# Patient Record
Sex: Male | Born: 1960 | Race: White | Hispanic: No | Marital: Married | Smoking: Former smoker
Health system: Southern US, Community
[De-identification: ages and names within clinical notes are randomized; demographics above are authoritative.]

## PROBLEM LIST (undated history)

## (undated) DIAGNOSIS — M51369 Other intervertebral disc degeneration, lumbar region without mention of lumbar back pain or lower extremity pain: Secondary | ICD-10-CM

## (undated) DIAGNOSIS — I719 Aortic aneurysm of unspecified site, without rupture: Secondary | ICD-10-CM

## (undated) DIAGNOSIS — I10 Essential (primary) hypertension: Secondary | ICD-10-CM

## (undated) DIAGNOSIS — M549 Dorsalgia, unspecified: Secondary | ICD-10-CM

## (undated) DIAGNOSIS — IMO0001 Reserved for inherently not codable concepts without codable children: Secondary | ICD-10-CM

## (undated) DIAGNOSIS — I2510 Atherosclerotic heart disease of native coronary artery without angina pectoris: Secondary | ICD-10-CM

## (undated) DIAGNOSIS — Z87442 Personal history of urinary calculi: Secondary | ICD-10-CM

## (undated) DIAGNOSIS — I480 Paroxysmal atrial fibrillation: Secondary | ICD-10-CM

## (undated) DIAGNOSIS — J189 Pneumonia, unspecified organism: Secondary | ICD-10-CM

## (undated) DIAGNOSIS — M5136 Other intervertebral disc degeneration, lumbar region: Secondary | ICD-10-CM

## (undated) DIAGNOSIS — K219 Gastro-esophageal reflux disease without esophagitis: Secondary | ICD-10-CM

## (undated) DIAGNOSIS — G8929 Other chronic pain: Secondary | ICD-10-CM

## (undated) HISTORY — PX: BACK SURGERY: SHX140

## (undated) HISTORY — PX: TONSILLECTOMY: SUR1361

---

## 2001-07-31 ENCOUNTER — Encounter: Payer: Self-pay | Admitting: Emergency Medicine

## 2001-07-31 ENCOUNTER — Emergency Department (HOSPITAL_COMMUNITY): Admission: EM | Admit: 2001-07-31 | Discharge: 2001-07-31 | Payer: Self-pay | Admitting: Emergency Medicine

## 2002-06-14 ENCOUNTER — Emergency Department (HOSPITAL_COMMUNITY): Admission: EM | Admit: 2002-06-14 | Discharge: 2002-06-14 | Payer: Self-pay | Admitting: Emergency Medicine

## 2002-09-22 ENCOUNTER — Emergency Department (HOSPITAL_COMMUNITY): Admission: EM | Admit: 2002-09-22 | Discharge: 2002-09-22 | Payer: Self-pay | Admitting: Emergency Medicine

## 2003-06-16 ENCOUNTER — Emergency Department (HOSPITAL_COMMUNITY): Admission: EM | Admit: 2003-06-16 | Discharge: 2003-06-16 | Payer: Self-pay | Admitting: Emergency Medicine

## 2004-02-05 ENCOUNTER — Emergency Department (HOSPITAL_COMMUNITY): Admission: EM | Admit: 2004-02-05 | Discharge: 2004-02-05 | Payer: Self-pay | Admitting: Emergency Medicine

## 2005-02-11 ENCOUNTER — Emergency Department (HOSPITAL_COMMUNITY): Admission: EM | Admit: 2005-02-11 | Discharge: 2005-02-11 | Payer: Self-pay | Admitting: Emergency Medicine

## 2005-03-28 ENCOUNTER — Emergency Department (HOSPITAL_COMMUNITY): Admission: EM | Admit: 2005-03-28 | Discharge: 2005-03-28 | Payer: Self-pay | Admitting: Emergency Medicine

## 2006-05-09 ENCOUNTER — Emergency Department (HOSPITAL_COMMUNITY): Admission: EM | Admit: 2006-05-09 | Discharge: 2006-05-09 | Payer: Self-pay | Admitting: Emergency Medicine

## 2007-06-12 ENCOUNTER — Emergency Department (HOSPITAL_COMMUNITY): Admission: EM | Admit: 2007-06-12 | Discharge: 2007-06-12 | Payer: Self-pay | Admitting: *Deleted

## 2007-12-27 ENCOUNTER — Ambulatory Visit: Payer: Self-pay | Admitting: Cardiology

## 2008-06-15 ENCOUNTER — Emergency Department (HOSPITAL_COMMUNITY): Admission: EM | Admit: 2008-06-15 | Discharge: 2008-06-15 | Payer: Self-pay | Admitting: Emergency Medicine

## 2008-06-29 ENCOUNTER — Emergency Department (HOSPITAL_COMMUNITY): Admission: EM | Admit: 2008-06-29 | Discharge: 2008-06-29 | Payer: Self-pay | Admitting: Emergency Medicine

## 2008-07-08 ENCOUNTER — Emergency Department (HOSPITAL_COMMUNITY): Admission: EM | Admit: 2008-07-08 | Discharge: 2008-07-08 | Payer: Self-pay | Admitting: Emergency Medicine

## 2008-07-11 ENCOUNTER — Emergency Department (HOSPITAL_COMMUNITY): Admission: EM | Admit: 2008-07-11 | Discharge: 2008-07-11 | Payer: Self-pay | Admitting: Emergency Medicine

## 2008-08-15 ENCOUNTER — Emergency Department (HOSPITAL_COMMUNITY): Admission: EM | Admit: 2008-08-15 | Discharge: 2008-08-15 | Payer: Self-pay | Admitting: Emergency Medicine

## 2009-06-21 ENCOUNTER — Emergency Department (HOSPITAL_COMMUNITY): Admission: EM | Admit: 2009-06-21 | Discharge: 2009-06-21 | Payer: Self-pay | Admitting: Emergency Medicine

## 2009-07-05 ENCOUNTER — Emergency Department (HOSPITAL_COMMUNITY): Admission: EM | Admit: 2009-07-05 | Discharge: 2009-07-05 | Payer: Self-pay | Admitting: Emergency Medicine

## 2009-08-05 ENCOUNTER — Emergency Department (HOSPITAL_COMMUNITY): Admission: EM | Admit: 2009-08-05 | Discharge: 2009-08-05 | Payer: Self-pay | Admitting: Emergency Medicine

## 2009-11-13 ENCOUNTER — Emergency Department (HOSPITAL_COMMUNITY): Admission: EM | Admit: 2009-11-13 | Discharge: 2009-11-13 | Payer: Self-pay | Admitting: Emergency Medicine

## 2010-05-18 ENCOUNTER — Emergency Department (HOSPITAL_COMMUNITY): Admission: EM | Admit: 2010-05-18 | Discharge: 2010-05-18 | Payer: Self-pay | Admitting: Emergency Medicine

## 2010-07-10 ENCOUNTER — Emergency Department (HOSPITAL_COMMUNITY): Admission: EM | Admit: 2010-07-10 | Discharge: 2010-07-10 | Payer: Self-pay | Admitting: Emergency Medicine

## 2010-07-15 ENCOUNTER — Emergency Department (HOSPITAL_COMMUNITY): Admission: EM | Admit: 2010-07-15 | Discharge: 2010-07-15 | Payer: Self-pay | Admitting: Emergency Medicine

## 2010-08-01 ENCOUNTER — Emergency Department (HOSPITAL_COMMUNITY): Admission: EM | Admit: 2010-08-01 | Discharge: 2010-08-01 | Payer: Self-pay | Admitting: Emergency Medicine

## 2010-08-08 ENCOUNTER — Emergency Department (HOSPITAL_COMMUNITY): Admission: EM | Admit: 2010-08-08 | Discharge: 2010-08-08 | Payer: Self-pay | Admitting: Emergency Medicine

## 2010-09-01 ENCOUNTER — Emergency Department (HOSPITAL_COMMUNITY): Admission: EM | Admit: 2010-09-01 | Discharge: 2010-09-01 | Payer: Self-pay | Admitting: Emergency Medicine

## 2010-09-02 ENCOUNTER — Ambulatory Visit (HOSPITAL_COMMUNITY): Admission: RE | Admit: 2010-09-02 | Discharge: 2010-09-02 | Payer: Self-pay | Admitting: Emergency Medicine

## 2010-09-06 ENCOUNTER — Emergency Department (HOSPITAL_COMMUNITY): Admission: EM | Admit: 2010-09-06 | Discharge: 2010-09-06 | Payer: Self-pay | Admitting: Emergency Medicine

## 2010-09-11 ENCOUNTER — Emergency Department (HOSPITAL_COMMUNITY): Admission: EM | Admit: 2010-09-11 | Discharge: 2010-09-11 | Payer: Self-pay | Admitting: Emergency Medicine

## 2010-09-20 ENCOUNTER — Encounter: Admission: RE | Admit: 2010-09-20 | Discharge: 2010-09-20 | Payer: Self-pay | Admitting: Neurological Surgery

## 2010-10-07 ENCOUNTER — Emergency Department (HOSPITAL_COMMUNITY)
Admission: EM | Admit: 2010-10-07 | Discharge: 2010-10-07 | Payer: Self-pay | Source: Home / Self Care | Admitting: Emergency Medicine

## 2011-01-15 ENCOUNTER — Encounter: Payer: Self-pay | Admitting: Orthopaedic Surgery

## 2011-01-15 ENCOUNTER — Encounter: Payer: Self-pay | Admitting: Neurological Surgery

## 2011-01-16 ENCOUNTER — Encounter: Payer: Self-pay | Admitting: Orthopaedic Surgery

## 2011-03-10 LAB — GLUCOSE, CAPILLARY: Glucose-Capillary: 246 mg/dL — ABNORMAL HIGH (ref 70–99)

## 2011-03-11 LAB — BASIC METABOLIC PANEL
CO2: 29 mEq/L (ref 19–32)
Calcium: 8.9 mg/dL (ref 8.4–10.5)
Creatinine, Ser: 0.75 mg/dL (ref 0.4–1.5)
GFR calc Af Amer: >60 mL/min (ref >60)
Glucose, Bld: 191 mg/dL — ABNORMAL HIGH (ref 70–99)
Sodium: 134 mEq/L — ABNORMAL LOW (ref 135–145)

## 2011-03-11 LAB — DIFFERENTIAL
Basophils Relative: 0 % (ref 0–1)
Eosinophils Relative: 4 % (ref 0–5)
Lymphocytes Relative: 33 % (ref 12–46)
Monocytes Absolute: 0.6 10*3/uL (ref 0.1–1.0)
Monocytes Relative: 9 % (ref 3–12)

## 2011-03-11 LAB — CBC
MCHC: 35.4 g/dL (ref 30.0–36.0)
Platelets: 278 10*3/uL (ref 150–400)
RBC: 4.05 MIL/uL — ABNORMAL LOW (ref 4.22–5.81)

## 2011-03-11 LAB — URINE CULTURE: Culture  Setup Time: 201108071945

## 2011-03-11 LAB — URINALYSIS, ROUTINE W REFLEX MICROSCOPIC: Urobilinogen, UA: 0.2 mg/dL (ref 0.0–1.0)

## 2011-03-11 LAB — BRAIN NATRIURETIC PEPTIDE: Pro B Natriuretic peptide (BNP): <30 pg/mL (ref 0.0–100.0)

## 2011-04-03 LAB — POCT I-STAT, CHEM 8
Calcium, Ion: 1.19 mmol/L (ref 1.12–1.32)
Chloride: 106 mEq/L (ref 96–112)
Creatinine, Ser: 0.9 mg/dL (ref 0.4–1.5)
Glucose, Bld: 138 mg/dL — ABNORMAL HIGH (ref 70–99)
Hemoglobin: 15 g/dL (ref 13.0–17.0)
Potassium: 3.8 mEq/L (ref 3.5–5.1)
Sodium: 141 mEq/L (ref 135–145)
TCO2: 26 mmol/L (ref 0–100)

## 2011-05-25 ENCOUNTER — Emergency Department (HOSPITAL_COMMUNITY)
Admission: EM | Admit: 2011-05-25 | Discharge: 2011-05-25 | Disposition: A | Discharge status: Home / Self Care | Payer: Medicare Other | Attending: Emergency Medicine | Admitting: Emergency Medicine

## 2011-05-25 DIAGNOSIS — F17200 Nicotine dependence, unspecified, uncomplicated: Secondary | ICD-10-CM | POA: Insufficient documentation

## 2011-05-25 DIAGNOSIS — R109 Unspecified abdominal pain: Secondary | ICD-10-CM | POA: Insufficient documentation

## 2011-05-25 DIAGNOSIS — R319 Hematuria, unspecified: Secondary | ICD-10-CM | POA: Insufficient documentation

## 2011-05-25 DIAGNOSIS — E119 Type 2 diabetes mellitus without complications: Secondary | ICD-10-CM | POA: Insufficient documentation

## 2011-05-25 LAB — BASIC METABOLIC PANEL
BUN: 13 mg/dL (ref 6–23)
CO2: 30 mEq/L (ref 19–32)
Calcium: 10.1 mg/dL (ref 8.4–10.5)
Chloride: 98 mEq/L (ref 96–112)
GFR calc Af Amer: >60 mL/min (ref >60)
GFR calc non Af Amer: >60 mL/min (ref >60)
Potassium: 3.2 mEq/L — ABNORMAL LOW (ref 3.5–5.1)
Sodium: 137 mEq/L (ref 135–145)

## 2011-05-25 LAB — URINALYSIS, ROUTINE W REFLEX MICROSCOPIC
Ketones, ur: NEGATIVE mg/dL
Leukocytes, UA: NEGATIVE
Specific Gravity, Urine: 1.02 (ref 1.005–1.030)

## 2011-05-25 LAB — URINE MICROSCOPIC-ADD ON

## 2011-07-13 ENCOUNTER — Other Ambulatory Visit: Payer: Self-pay | Admitting: Medical

## 2011-07-13 NOTE — Telephone Encounter (Signed)
Is this ok?

## 2011-10-29 ENCOUNTER — Other Ambulatory Visit: Payer: Self-pay | Admitting: Medical

## 2011-10-31 NOTE — Telephone Encounter (Signed)
Is this okay to refill? 

## 2011-11-01 NOTE — Telephone Encounter (Signed)
pls pull chart 

## 2011-12-24 ENCOUNTER — Encounter: Payer: Self-pay | Admitting: *Deleted

## 2011-12-24 ENCOUNTER — Emergency Department (HOSPITAL_COMMUNITY)
Admission: EM | Admit: 2011-12-24 | Discharge: 2011-12-24 | Discharge status: Against Medical Advice | Payer: Medicare Other | Attending: Emergency Medicine | Admitting: Emergency Medicine

## 2011-12-24 DIAGNOSIS — M549 Dorsalgia, unspecified: Secondary | ICD-10-CM | POA: Insufficient documentation

## 2011-12-24 HISTORY — DX: Other intervertebral disc degeneration, lumbar region without mention of lumbar back pain or lower extremity pain: M51.369

## 2011-12-24 HISTORY — DX: Other intervertebral disc degeneration, lumbar region: M51.36

## 2011-12-24 NOTE — ED Notes (Signed)
Pt has history of back disc degeneration, now haas increased back pain and leg pain with numbness in toes

## 2012-01-05 ENCOUNTER — Emergency Department (HOSPITAL_COMMUNITY)
Admission: EM | Admit: 2012-01-05 | Discharge: 2012-01-05 | Disposition: A | Discharge status: Home / Self Care | Payer: Medicare Other | Attending: Emergency Medicine | Admitting: Emergency Medicine

## 2012-01-05 ENCOUNTER — Encounter (HOSPITAL_COMMUNITY): Payer: Self-pay | Admitting: Emergency Medicine

## 2012-01-05 DIAGNOSIS — M5137 Other intervertebral disc degeneration, lumbosacral region: Secondary | ICD-10-CM | POA: Insufficient documentation

## 2012-01-05 DIAGNOSIS — I10 Essential (primary) hypertension: Secondary | ICD-10-CM | POA: Insufficient documentation

## 2012-01-05 DIAGNOSIS — Z794 Long term (current) use of insulin: Secondary | ICD-10-CM | POA: Insufficient documentation

## 2012-01-05 DIAGNOSIS — M51379 Other intervertebral disc degeneration, lumbosacral region without mention of lumbar back pain or lower extremity pain: Secondary | ICD-10-CM | POA: Insufficient documentation

## 2012-01-05 DIAGNOSIS — M5431 Sciatica, right side: Secondary | ICD-10-CM

## 2012-01-05 DIAGNOSIS — M5430 Sciatica, unspecified side: Secondary | ICD-10-CM | POA: Insufficient documentation

## 2012-01-05 DIAGNOSIS — F17200 Nicotine dependence, unspecified, uncomplicated: Secondary | ICD-10-CM | POA: Insufficient documentation

## 2012-01-05 DIAGNOSIS — M109 Gout, unspecified: Secondary | ICD-10-CM | POA: Insufficient documentation

## 2012-01-05 DIAGNOSIS — E119 Type 2 diabetes mellitus without complications: Secondary | ICD-10-CM | POA: Insufficient documentation

## 2012-01-05 HISTORY — DX: Essential (primary) hypertension: I10

## 2012-01-05 MED ORDER — OXYCODONE-ACETAMINOPHEN 5-325 MG PO TABS: 1.0000 | ORAL_TABLET | Freq: Four times a day (QID) | ORAL | Status: AC | PRN

## 2012-01-05 MED ORDER — DIAZEPAM 5 MG PO TABS
10.0000 mg | ORAL_TABLET | Freq: Once | ORAL | Status: AC
  Administered 2012-01-05: 10 mg via ORAL
  Filled 2012-01-05: qty 2

## 2012-01-05 MED ORDER — IBUPROFEN 600 MG PO TABS: 600.0000 mg | ORAL_TABLET | Freq: Four times a day (QID) | ORAL | Status: AC | PRN

## 2012-01-05 MED ORDER — HYDROMORPHONE HCL PF 2 MG/ML IJ SOLN
2.0000 mg | Freq: Once | INTRAMUSCULAR | Status: AC
  Administered 2012-01-05: 2 mg via INTRAMUSCULAR
  Filled 2012-01-05: qty 1

## 2012-01-05 MED ORDER — CYCLOBENZAPRINE HCL 10 MG PO TABS: 10.0000 mg | ORAL_TABLET | Freq: Two times a day (BID) | ORAL | Status: AC | PRN

## 2012-01-05 MED ORDER — KETOROLAC TROMETHAMINE 60 MG/2ML IM SOLN
60.0000 mg | Freq: Once | INTRAMUSCULAR | Status: AC
  Administered 2012-01-05: 60 mg via INTRAMUSCULAR
  Filled 2012-01-05: qty 2

## 2012-01-05 NOTE — ED Notes (Signed)
Pt c/o pain to right hip and radiating down right leg. Pt states he can not get in with neuro/spine specialist 01/13/12.

## 2012-01-05 NOTE — ED Provider Notes (Signed)
History     CSN: [id number]  Arrival date & time 01/05/12  1458   First MD Initiated Contact with Patient 01/05/12 1608      Chief Complaint  Patient presents with  . Hip Pain    (Consider location/radiation/quality/duration/timing/severity/associated sxs/prior treatment) HPI Patient presents with complaint of low back pain radiating into his right hip and down posterior right leg. He states he has had similar pain intermittently over the past year and a half but has been worse over the past 2 weeks. There is no new injury or trauma. He denies fevers or chills. He has no weakness in his legs and no difficulty urinating and no loss of continence of bowel or bladder. He does state he has had difficulty walking and moving about doing his daily activities secondary to the pain. He has tried Engineer, agricultural. He states the Percocet helped with mild relief but he has run out and it did not provide complete relief. He states he had an appointment today with Dr. Joylene Igo, neurosurgery and the appointment had to be changed and is now scheduled for January 18. Movement and palpation make the pain worse. There no other alleviating or modifying factors. There no associated systemic symptoms.  Past Medical History  Diagnosis Date  . Degeneration of lumbar intervertebral disc   . Diabetes mellitus   . Gout   . Hypertension     Past Surgical History  Procedure Date  . Tonsillectomy     History reviewed. No pertinent family history.  History  Substance Use Topics  . Smoking status: Current Everyday Smoker -- 10 years  . Smokeless tobacco: Not on file  . Alcohol Use: 0.6 oz/week    1 Cans of beer per week      Review of Systems ROS reviewed and otherwise negative except for mentioned in HPI  Allergies  Review of patient's allergies indicates no known allergies.  Home Medications   Current Outpatient Rx  Name Route Sig Dispense Refill  . COLCHICINE 0.6 MG PO TABS Oral Take 0.6 mg by  mouth daily.    Marland Kitchen GABAPENTIN 300 MG PO CAPS Oral Take 300 mg by mouth daily.    Marland Kitchen GLIMEPIRIDE 2 MG PO TABS Oral Take 2 mg by mouth daily before breakfast.    . INSULIN GLARGINE 100 UNIT/ML [place] SOLN Subcutaneous Inject 20 Units into the skin daily.    Marland Kitchen LISINOPRIL-HYDROCHLOROTHIAZIDE 20-12.5 MG PO TABS Oral Take 1 tablet by mouth daily.    Marland Kitchen OMEPRAZOLE 20 MG PO CPDR Oral Take 20 mg by mouth daily.    . CYCLOBENZAPRINE HCL 10 MG PO TABS Oral Take 1 tablet (10 mg total) by mouth 2 (two) times daily as needed for muscle spasms. 20 tablet 0  . IBUPROFEN 600 MG PO TABS Oral Take 1 tablet (600 mg total) by mouth every 6 (six) hours as needed for pain. 30 tablet 0  . OXYCODONE-ACETAMINOPHEN 5-325 MG PO TABS Oral Take 1-2 tablets by mouth every 6 (six) hours as needed for pain. 15 tablet 0    BP 139/79  Pulse 92  Temp(Src) 97.5 F (36.4 C) (Oral)  Resp 20  Ht 6' (1.829 m)  Wt 265 lb (120.203 kg)  BMI 35.94 kg/m2  SpO2 98% Vitals reviewed Physical Exam Physical Examination: General appearance - alert, uncomfortable, but well appearing, and in no distress Mental status - alert, oriented to person, place, and time Chest - clear to auscultation, no wheezes, rales or rhonchi, symmetric air entry  Heart - normal rate, regular rhythm, normal S1, S2, no murmurs, rubs, clicks or gallops Abdomen - soft, nontender, nondistended, no masses or organomegaly Back exam - full range of motion, paraspinous lumbar tenderness on right, no midline tenderness Neurological - alert, oriented, normal speech, no focal findings, strength 5/5 of extremities- somewhat limited by pain, subjective decreased sensation of right toes Musculoskeletal - no joint tenderness, deformity or swelling Extremities - peripheral pulses normal, no pedal edema, no clubbing or cyanosis Skin - normal coloration and turgor, no rashes  ED Course  Procedures (including critical care time)  Labs Reviewed - No data to display No results  found.   1. Sciatica of right side       MDM  Patient presenting with worsening of his chronic low back pain with radiation down the right leg. I reviewed his MRI and x-ray results from the past which showed degenerative changes. He's had no new trauma or injuries. He was scheduled to see Dr. Tia Masker, neurosurgery today but states that the office changed his appointment which prompted ED evaluation. He has no neurologic objective findings on exam but does appear uncomfortable. He was given injections of dye allotted and Toradol for pain. His vital signs were improved on recheck. They're no signs or symptoms of cauda equina. He was advised to keep his appointment as now scheduled for January 18 with neuro surgery. He was discharged with pain medications and is agreeable with this plan.        Ethelda Chick, MD 01/05/12 [phone]

## 2012-06-09 ENCOUNTER — Other Ambulatory Visit: Payer: Self-pay | Admitting: Medical

## 2012-07-03 ENCOUNTER — Emergency Department (HOSPITAL_COMMUNITY): Payer: Medicare Other

## 2012-07-03 ENCOUNTER — Emergency Department (HOSPITAL_COMMUNITY)
Admission: EM | Admit: 2012-07-03 | Discharge: 2012-07-03 | Disposition: A | Discharge status: Home / Self Care | Payer: Medicare Other | Attending: Emergency Medicine | Admitting: Emergency Medicine

## 2012-07-03 ENCOUNTER — Encounter (HOSPITAL_COMMUNITY): Payer: Self-pay | Admitting: *Deleted

## 2012-07-03 DIAGNOSIS — M109 Gout, unspecified: Secondary | ICD-10-CM

## 2012-07-03 DIAGNOSIS — M25569 Pain in unspecified knee: Secondary | ICD-10-CM | POA: Insufficient documentation

## 2012-07-03 DIAGNOSIS — W19XXXA Unspecified fall, initial encounter: Secondary | ICD-10-CM | POA: Insufficient documentation

## 2012-07-03 DIAGNOSIS — E119 Type 2 diabetes mellitus without complications: Secondary | ICD-10-CM | POA: Insufficient documentation

## 2012-07-03 DIAGNOSIS — I10 Essential (primary) hypertension: Secondary | ICD-10-CM | POA: Insufficient documentation

## 2012-07-03 DIAGNOSIS — Z794 Long term (current) use of insulin: Secondary | ICD-10-CM | POA: Insufficient documentation

## 2012-07-03 DIAGNOSIS — S8000XA Contusion of unspecified knee, initial encounter: Secondary | ICD-10-CM | POA: Insufficient documentation

## 2012-07-03 DIAGNOSIS — S8002XA Contusion of left knee, initial encounter: Secondary | ICD-10-CM

## 2012-07-03 MED ORDER — PREDNISONE 50 MG PO TABS: 50.0000 mg | ORAL_TABLET | Freq: Every day | ORAL | Status: AC

## 2012-07-03 MED ORDER — OXYCODONE-ACETAMINOPHEN 5-325 MG PO TABS: ORAL_TABLET | ORAL | Status: DC

## 2012-07-03 MED ORDER — PREDNISONE 20 MG PO TABS
60.0000 mg | ORAL_TABLET | Freq: Once | ORAL | Status: AC
  Administered 2012-07-03: 60 mg via ORAL
  Filled 2012-07-03: qty 3

## 2012-07-03 MED ORDER — OXYCODONE-ACETAMINOPHEN 5-325 MG PO TABS
1.0000 | ORAL_TABLET | Freq: Once | ORAL | Status: AC
  Administered 2012-07-03: 1 via ORAL

## 2012-07-03 MED ORDER — OXYCODONE-ACETAMINOPHEN 5-325 MG PO TABS
ORAL_TABLET | ORAL | Status: AC
  Filled 2012-07-03: qty 1

## 2012-07-03 NOTE — ED Notes (Signed)
Pt c/o pain to left knee, pain started a week ago and pt thought it was his gout "flaring up" pt has been taking medication for gout, pt also fell a few days ago landing on left knee, cms intact distal, does have swelling noted to left knee area.

## 2012-07-03 NOTE — ED Provider Notes (Signed)
History     CSN: [id number]  Arrival date & time 07/03/12  1204   First MD Initiated Contact with Patient 07/03/12 1517      Chief Complaint  Patient presents with  . Knee Pain    (Consider location/radiation/quality/duration/timing/severity/associated sxs/prior treatment) HPI Comments: Pt has been having gout pain in L knee for past 5 days.  Taking colchicine with minimal relief.  Fell and twisted L knee 2 days ago with swelling and increased pain.    The history is provided by the patient. No language interpreter was used.    Past Medical History  Diagnosis Date  . Degeneration of lumbar intervertebral disc   . Diabetes mellitus   . Gout   . Hypertension     Past Surgical History  Procedure Date  . Tonsillectomy     No family history on file.  History  Substance Use Topics  . Smoking status: Current Everyday Smoker -- 10 years  . Smokeless tobacco: Not on file  . Alcohol Use: 0.6 oz/week    1 Cans of beer per week      Review of Systems  Musculoskeletal:       Knee injury   Neurological: Negative for weakness and numbness.  All other systems reviewed and are negative.    Allergies  Tramadol  Home Medications   Current Outpatient Rx  Name Route Sig Dispense Refill  . COLCHICINE 0.6 MG PO TABS Oral Take 0.6 mg by mouth every morning.     Marland Kitchen GLIMEPIRIDE 4 MG PO TABS Oral Take 4 mg by mouth daily before breakfast.    . INSULIN GLARGINE 100 UNIT/ML [place] SOLN Subcutaneous Inject 20-24 Units into the skin every morning. *Patient only takes if sugar levels are elevated*    . LISINOPRIL-HYDROCHLOROTHIAZIDE 20-12.5 MG PO TABS Oral Take 1 tablet by mouth every morning.     Marland Kitchen OMEPRAZOLE 20 MG PO CPDR Oral Take 20 mg by mouth 2 (two) times daily.       BP 156/90  Pulse 104  Temp 98.4 F (36.9 C) (Oral)  Resp 20  Ht 6' (1.829 m)  Wt 265 lb (120.203 kg)  BMI 35.94 kg/m2  SpO2 98%  Physical Exam  Nursing note and vitals reviewed. Constitutional: He is  oriented to person, place, and time. He appears well-developed and well-nourished.  HENT:  Head: Normocephalic and atraumatic.  Eyes: EOM are normal.  Neck: Normal range of motion.  Cardiovascular: Normal rate, regular rhythm, normal heart sounds and intact distal pulses.   Pulmonary/Chest: Effort normal and breath sounds normal. No respiratory distress.  Abdominal: Soft. He exhibits no distension. There is no tenderness.  Musculoskeletal: He exhibits tenderness.       Left knee: He exhibits decreased range of motion and swelling. He exhibits no effusion, no ecchymosis, no deformity and no laceration. tenderness found.  Neurological: He is alert and oriented to person, place, and time. Coordination normal.  Skin: Skin is warm and dry.  Psychiatric: He has a normal mood and affect. Judgment normal.    ED Course  Procedures (including critical care time)  Labs Reviewed - No data to display Dg Knee Complete 4 Views Left  07/03/2012  *RADIOLOGY REPORT*  Clinical Data: Right knee pain and swelling over the past 4-5 days. Fall 2 days ago with worsening symptoms.  History of gout.  LEFT KNEE - COMPLETE 4+ VIEW  Comparison: None.  Findings: No evidence of acute or subacute fracture or dislocation. Well-preserved joint spaces.  No erosions.  No visible joint effusion.  Enthesopathic spur on the superior patella at the insertion of the quadriceps tendon.  IMPRESSION: No acute osseous abnormalities.  No visible joint effusion.  Original Report Authenticated By: Arnell Sieving, M.D.     No diagnosis found.    MDM  No fx's.  No effusion.   Knee immobilizer, ace wrap. Ice rx- prednisone 50 mg ,^ rx-percocet , 20 F/u with dr. Carollee Herter, PA 07/03/12 [phone]

## 2012-07-04 NOTE — ED Provider Notes (Signed)
Medical screening examination/treatment/procedure(s) were performed by non-physician practitioner and as supervising physician I was immediately available for consultation/collaboration.  Geoffery Lyons, MD 07/04/12 [phone]

## 2013-06-04 ENCOUNTER — Encounter (HOSPITAL_COMMUNITY): Payer: Self-pay

## 2013-06-04 ENCOUNTER — Emergency Department (HOSPITAL_COMMUNITY)
Admission: EM | Admit: 2013-06-04 | Discharge: 2013-06-04 | Disposition: A | Discharge status: Home / Self Care | Payer: Medicare Other | Attending: Emergency Medicine | Admitting: Emergency Medicine

## 2013-06-04 ENCOUNTER — Emergency Department (HOSPITAL_COMMUNITY): Payer: Medicare Other

## 2013-06-04 DIAGNOSIS — E119 Type 2 diabetes mellitus without complications: Secondary | ICD-10-CM | POA: Insufficient documentation

## 2013-06-04 DIAGNOSIS — M545 Low back pain, unspecified: Secondary | ICD-10-CM | POA: Insufficient documentation

## 2013-06-04 DIAGNOSIS — M5137 Other intervertebral disc degeneration, lumbosacral region: Secondary | ICD-10-CM | POA: Insufficient documentation

## 2013-06-04 DIAGNOSIS — Z8639 Personal history of other endocrine, nutritional and metabolic disease: Secondary | ICD-10-CM | POA: Insufficient documentation

## 2013-06-04 DIAGNOSIS — Z79899 Other long term (current) drug therapy: Secondary | ICD-10-CM | POA: Insufficient documentation

## 2013-06-04 DIAGNOSIS — M51379 Other intervertebral disc degeneration, lumbosacral region without mention of lumbar back pain or lower extremity pain: Secondary | ICD-10-CM | POA: Insufficient documentation

## 2013-06-04 DIAGNOSIS — F17200 Nicotine dependence, unspecified, uncomplicated: Secondary | ICD-10-CM | POA: Insufficient documentation

## 2013-06-04 DIAGNOSIS — Z9889 Other specified postprocedural states: Secondary | ICD-10-CM | POA: Insufficient documentation

## 2013-06-04 DIAGNOSIS — G8929 Other chronic pain: Secondary | ICD-10-CM | POA: Insufficient documentation

## 2013-06-04 DIAGNOSIS — Z794 Long term (current) use of insulin: Secondary | ICD-10-CM | POA: Insufficient documentation

## 2013-06-04 DIAGNOSIS — I10 Essential (primary) hypertension: Secondary | ICD-10-CM | POA: Insufficient documentation

## 2013-06-04 DIAGNOSIS — Z862 Personal history of diseases of the blood and blood-forming organs and certain disorders involving the immune mechanism: Secondary | ICD-10-CM | POA: Insufficient documentation

## 2013-06-04 HISTORY — DX: Other chronic pain: G89.29

## 2013-06-04 HISTORY — DX: Dorsalgia, unspecified: M54.9

## 2013-06-04 MED ORDER — OXYCODONE-ACETAMINOPHEN 5-325 MG PO TABS: 1.0000 | ORAL_TABLET | ORAL | Status: DC | PRN

## 2013-06-04 MED ORDER — HYDROMORPHONE HCL PF 1 MG/ML IJ SOLN
1.0000 mg | Freq: Once | INTRAMUSCULAR | Status: AC
  Administered 2013-06-04: 1 mg via INTRAVENOUS
  Filled 2013-06-04: qty 1

## 2013-06-04 MED ORDER — DIAZEPAM 5 MG/ML IJ SOLN
5.0000 mg | Freq: Once | INTRAMUSCULAR | Status: AC
  Administered 2013-06-04: 5 mg via INTRAVENOUS
  Filled 2013-06-04: qty 2

## 2013-06-04 NOTE — ED Notes (Signed)
Patient states he is still having pain at this time. RN made aware. 

## 2013-06-04 NOTE — ED Notes (Signed)
Pt arrived from home by ems, has chronic back pain and stated his meds are not working for his pain--is on vicodin.  Has appointment Friday to see dr. Margo Common   Denies any new or recent injury.

## 2013-06-06 NOTE — ED Provider Notes (Signed)
History     CSN: [id number]  Arrival date & time 06/04/13  1022   First MD Initiated Contact with Patient 06/04/13 1057      Chief Complaint  Patient presents with  . Back Pain    (Consider location/radiation/quality/duration/timing/severity/associated sxs/prior treatment) HPI Comments: Thomas Donovan is a 52 y.o. Male presenting with acute on chronic low back pain which has which has been present for the past 2 days.   Patient denies any new injury specifically, but had to lift yard equipment at home this weekend and developed worsened pain in his lower back.  He has known degenerative disk disease in his lumbar spine.  There is no radiation into the  lower extremities.  There has been no weakness or numbness in the lower extremities and no urinary or bowel retention or incontinence.  Patient does not have a history of cancer or IVDU.  He is currently taking hydrocodone which is not improving his pain.  He has a past history of lumbar surgery by Dr. Channing Mutters but is scheduled to see his pcp in 3 days in anticipation further evaluation and/or referral back to the neurosurgeon.  He has taken ibuprofen 800 mg 3 times daily without relief of his pain.  He can get some but not complete relief in certain positions when supine.      The history is provided by the patient.    Past Medical History  Diagnosis Date  . Degeneration of lumbar intervertebral disc   . Diabetes mellitus   . Gout   . Hypertension   . Chronic back pain     Past Surgical History  Procedure Laterality Date  . Tonsillectomy    . Back surgery      No family history on file.  History  Substance Use Topics  . Smoking status: Current Every Day Smoker -- 10 years    Types: Cigarettes  . Smokeless tobacco: Not on file  . Alcohol Use: 0.6 oz/week    1 Cans of beer per week      Review of Systems  Constitutional: Negative for fever.  Respiratory: Negative for shortness of breath.   Cardiovascular: Negative for  chest pain and leg swelling.  Gastrointestinal: Negative for abdominal pain, constipation and abdominal distention.  Genitourinary: Negative for dysuria, urgency, frequency, flank pain and difficulty urinating.  Musculoskeletal: Positive for back pain. Negative for joint swelling and gait problem.  Skin: Negative for rash.  Neurological: Negative for weakness and numbness.    Allergies  Tramadol  Home Medications   Current Outpatient Rx  Name  Route  Sig  Dispense  Refill  . insulin glargine (LANTUS) 100 UNIT/ML injection   Subcutaneous   Inject 26-30 Units into the skin every morning. *Patient only takes if sugar levels are elevated*         . lisinopril-hydrochlorothiazide (PRINZIDE,ZESTORETIC) 20-12.5 MG per tablet   Oral   Take 1 tablet by mouth every morning.          Marland Kitchen omeprazole (PRILOSEC) 20 MG capsule   Oral   Take 20 mg by mouth 2 (two) times daily.          Marland Kitchen oxyCODONE-acetaminophen (PERCOCET/ROXICET) 5-325 MG per tablet   Oral   Take 1 tablet by mouth every 4 (four) hours as needed for pain.   20 tablet   0     BP 136/71  Pulse 84  Temp(Src) 98.2 F (36.8 C) (Oral)  Resp 18  Ht 6' (1.829 m)  Wt 260 lb (117.935 kg)  BMI 35.25 kg/m2  SpO2 98%  Physical Exam  Nursing note and vitals reviewed. Constitutional: He appears well-developed and well-nourished.  HENT:  Head: Normocephalic.  Eyes: Conjunctivae are normal.  Neck: Normal range of motion. Neck supple.  Cardiovascular: Normal rate and intact distal pulses.   Pedal pulses normal.  Pulmonary/Chest: Effort normal.  Abdominal: Soft. Bowel sounds are normal. He exhibits no distension and no mass.  Musculoskeletal: Normal range of motion. He exhibits no edema.       Lumbar back: He exhibits tenderness. He exhibits no swelling, no edema and no spasm.  Midline lumbar tenderness to palpation.  Neurological: He is alert. He has normal strength. He displays no atrophy and no tremor. No sensory  deficit. Gait normal.  Reflex Scores:      Patellar reflexes are 2+ on the right side and 2+ on the left side.      Achilles reflexes are 2+ on the right side and 2+ on the left side. No strength deficit noted in hip and knee flexor and extensor muscle groups.  Ankle flexion and extension intact.  Skin: Skin is warm and dry.  Psychiatric: He has a normal mood and affect.    ED Course  Procedures (including critical care time)  Labs Reviewed - No data to display No results found.   1. Chronic low back pain       MDM    No neuro deficit on exam or by history to suggest emergent or surgical presentation.  Also discussed worsened sx that should prompt immediate re-evaluation including distal weakness, bowel/bladder retention/incontinence.  X-rays were reviewed with no evidence for acute injury from his lifting activity.  He was prescribed oxycodone in place of the hydrocodone and also encouraged to continue using ibuprofen and warm compresses or heating pad to his lower back.  Followup with his PCP in 3 days for further management of his pain symptoms.           Burgess Amor, PA-C 06/06/13 1543

## 2013-06-09 NOTE — ED Provider Notes (Signed)
Medical screening examination/treatment/procedure(s) were performed by non-physician practitioner and as supervising physician I was immediately available for consultation/collaboration. Devoria Albe, MD, Armando Gang   Ward Givens, MD 06/09/13 2115

## 2013-08-22 ENCOUNTER — Other Ambulatory Visit: Payer: Self-pay | Admitting: Neurological Surgery

## 2013-09-03 ENCOUNTER — Other Ambulatory Visit (HOSPITAL_COMMUNITY): Payer: Medicare Other

## 2013-09-05 ENCOUNTER — Inpatient Hospital Stay: Admit: 2013-09-05 | Payer: Self-pay | Admitting: Neurological Surgery

## 2013-09-05 SURGERY — FOR MAXIMUM ACCESS (MAS) POSTERIOR LUMBAR INTERBODY FUSION (PLIF) 1 LEVEL
Anesthesia: General | Site: Back

## 2013-10-28 ENCOUNTER — Other Ambulatory Visit: Payer: Self-pay | Admitting: Neurological Surgery

## 2013-11-06 ENCOUNTER — Encounter (HOSPITAL_COMMUNITY): Payer: Self-pay | Admitting: Pharmacy Technician

## 2013-11-12 ENCOUNTER — Other Ambulatory Visit (HOSPITAL_COMMUNITY): Payer: Medicare Other

## 2013-11-16 NOTE — Pre-Procedure Instructions (Addendum)
KYAL ARTS  11/16/2013   Your procedure is scheduled on:  November 26  Report to [facility] Entrance "A" [address] at 07:15 AM.  Call this number if you have problems the morning of surgery: [phone]   Remember:   Do not eat food or drink liquids after midnight.   Take these medicines the morning of surgery with A SIP OF WATER: Hydrocodone (if needed), Omeprazole    NO INSULIN OR DIABETIC MED AM OF SURGERY   STOP/ Do not take Aspirin, Aleve, Naproxen, Advil, Ibuprofen, Vitamin, Herbs, or Supplements starting today   Do not wear jewelry, make-up or nail polish.  Do not wear lotions, powders, or perfumes. You may wear deodorant.  Do not shave 48 hours prior to surgery. Men may shave face and neck.  Do not bring valuables to the hospital.  [facility] is not responsible                  for any belongings or valuables.               Contacts, dentures or bridgework may not be worn into surgery.  Leave suitcase in the car. After surgery it may be brought to your room.  For patients admitted to the hospital, discharge time is determined by your                treatment team.               Special Instructions: Shower using CHG 2 nights before surgery and the night before surgery.  If you shower the day of surgery use CHG.  Use special wash - you have one bottle of CHG for all showers.  You should use approximately 1/3 of the bottle for each shower.   Please read over the following fact sheets that you were given: Pain Booklet, Coughing and Deep Breathing, Blood Transfusion Information and Surgical Site Infection Prevention

## 2013-11-18 ENCOUNTER — Encounter (HOSPITAL_COMMUNITY): Payer: Self-pay

## 2013-11-18 ENCOUNTER — Encounter (HOSPITAL_COMMUNITY)
Admission: RE | Admit: 2013-11-18 | Discharge: 2013-11-18 | Disposition: A | Discharge status: Home / Self Care | Payer: Medicare Other | Source: Ambulatory Visit | Attending: Neurological Surgery | Admitting: Neurological Surgery

## 2013-11-18 ENCOUNTER — Ambulatory Visit (HOSPITAL_COMMUNITY)
Admission: RE | Admit: 2013-11-18 | Discharge: 2013-11-18 | Disposition: A | Discharge status: Home / Self Care | Payer: Medicare Other | Source: Ambulatory Visit | Attending: Neurological Surgery | Admitting: Neurological Surgery

## 2013-11-18 ENCOUNTER — Other Ambulatory Visit (HOSPITAL_COMMUNITY): Payer: Self-pay | Admitting: *Deleted

## 2013-11-18 DIAGNOSIS — I10 Essential (primary) hypertension: Secondary | ICD-10-CM | POA: Insufficient documentation

## 2013-11-18 DIAGNOSIS — E119 Type 2 diabetes mellitus without complications: Secondary | ICD-10-CM | POA: Insufficient documentation

## 2013-11-18 DIAGNOSIS — Z87891 Personal history of nicotine dependence: Secondary | ICD-10-CM | POA: Insufficient documentation

## 2013-11-18 HISTORY — DX: Personal history of urinary calculi: Z87.442

## 2013-11-18 HISTORY — DX: Reserved for inherently not codable concepts without codable children: IMO0001

## 2013-11-18 HISTORY — DX: Gastro-esophageal reflux disease without esophagitis: K21.9

## 2013-11-18 LAB — BASIC METABOLIC PANEL
BUN: 20 mg/dL (ref 6–23)
CO2: 26 mEq/L (ref 19–32)
Calcium: 9.3 mg/dL (ref 8.4–10.5)
Creatinine, Ser: 1.1 mg/dL (ref 0.50–1.35)
GFR calc non Af Amer: 76 mL/min — ABNORMAL LOW (ref >90)
Glucose, Bld: 158 mg/dL — ABNORMAL HIGH (ref 70–99)
Sodium: 136 mEq/L (ref 135–145)

## 2013-11-18 LAB — CBC WITH DIFFERENTIAL/PLATELET
Basophils Absolute: 0.1 10*3/uL (ref 0.0–0.1)
Eosinophils Absolute: 0.4 10*3/uL (ref 0.0–0.7)
HCT: 43.6 % (ref 39.0–52.0)
Lymphs Abs: 3.3 10*3/uL (ref 0.7–4.0)
MCH: 32.5 pg (ref 26.0–34.0)
Monocytes Relative: 10 % (ref 3–12)
Neutrophils Relative %: 53 % (ref 43–77)
Platelets: 241 10*3/uL (ref 150–400)
RBC: 4.74 MIL/uL (ref 4.22–5.81)
RDW: 13 % (ref 11.5–15.5)
WBC: 9.9 10*3/uL (ref 4.0–10.5)

## 2013-11-18 LAB — PROTIME-INR
INR: 0.96 (ref 0.00–1.49)
Prothrombin Time: 12.6 seconds (ref 11.6–15.2)

## 2013-11-18 LAB — TYPE AND SCREEN: ABO/RH(D): O POS

## 2013-11-18 LAB — SURGICAL PCR SCREEN
MRSA, PCR: NEGATIVE
Staphylococcus aureus: NEGATIVE

## 2013-11-18 NOTE — Progress Notes (Signed)
11/18/13 1310  OBSTRUCTIVE SLEEP APNEA  Have you ever been diagnosed with sleep apnea through a sleep study? No  Do you snore loudly (loud enough to be heard through closed doors)?  0  Do you often feel tired, fatigued, or sleepy during the daytime? 0  Has anyone observed you stop breathing during your sleep? 1  Do you have, or are you being treated for high blood pressure? 1  BMI more than 35 kg/m2? 1  Age over 51 years old? 1  Neck circumference greater than 40 cm/18 inches? 1 (18.5)  Gender: 1  Obstructive Sleep Apnea Score 6  Score 4 or greater  Results sent to PCP

## 2013-11-19 MED ORDER — CEFAZOLIN SODIUM 10 G IJ SOLR
3.0000 g | Freq: Once | INTRAMUSCULAR | Status: AC
  Administered 2013-11-20: 3 g via INTRAVENOUS
  Filled 2013-11-19 (×2): qty 3000

## 2013-11-19 MED ORDER — DEXAMETHASONE SODIUM PHOSPHATE 10 MG/ML IJ SOLN
10.0000 mg | INTRAMUSCULAR | Status: AC
  Administered 2013-11-20: 10 mg via INTRAVENOUS
  Filled 2013-11-19: qty 1

## 2013-11-20 ENCOUNTER — Encounter (HOSPITAL_COMMUNITY): Payer: Medicare Other | Admitting: Critical Care Medicine

## 2013-11-20 ENCOUNTER — Inpatient Hospital Stay (HOSPITAL_COMMUNITY)
Admission: RE | Admit: 2013-11-20 | Discharge: 2013-11-24 | DRG: 460 | Disposition: A | Discharge status: Home / Self Care | Payer: Medicare Other | Source: Ambulatory Visit | Attending: Neurological Surgery | Admitting: Neurological Surgery

## 2013-11-20 ENCOUNTER — Inpatient Hospital Stay (HOSPITAL_COMMUNITY): Payer: Medicare Other

## 2013-11-20 ENCOUNTER — Encounter (HOSPITAL_COMMUNITY)
Admission: RE | Disposition: A | Discharge status: Home / Self Care | Payer: Self-pay | Source: Ambulatory Visit | Attending: Neurological Surgery

## 2013-11-20 ENCOUNTER — Encounter (HOSPITAL_COMMUNITY): Payer: Self-pay | Admitting: Critical Care Medicine

## 2013-11-20 ENCOUNTER — Inpatient Hospital Stay (HOSPITAL_COMMUNITY): Payer: Medicare Other | Admitting: Critical Care Medicine

## 2013-11-20 DIAGNOSIS — Z6837 Body mass index (BMI) 37.0-37.9, adult: Secondary | ICD-10-CM

## 2013-11-20 DIAGNOSIS — I10 Essential (primary) hypertension: Secondary | ICD-10-CM | POA: Diagnosis present

## 2013-11-20 DIAGNOSIS — M109 Gout, unspecified: Secondary | ICD-10-CM | POA: Diagnosis present

## 2013-11-20 DIAGNOSIS — E119 Type 2 diabetes mellitus without complications: Secondary | ICD-10-CM | POA: Diagnosis present

## 2013-11-20 DIAGNOSIS — Z79899 Other long term (current) drug therapy: Secondary | ICD-10-CM

## 2013-11-20 DIAGNOSIS — Z87442 Personal history of urinary calculi: Secondary | ICD-10-CM

## 2013-11-20 DIAGNOSIS — G8929 Other chronic pain: Secondary | ICD-10-CM | POA: Diagnosis present

## 2013-11-20 DIAGNOSIS — F17200 Nicotine dependence, unspecified, uncomplicated: Secondary | ICD-10-CM | POA: Diagnosis present

## 2013-11-20 DIAGNOSIS — K219 Gastro-esophageal reflux disease without esophagitis: Secondary | ICD-10-CM | POA: Diagnosis present

## 2013-11-20 DIAGNOSIS — M5126 Other intervertebral disc displacement, lumbar region: Secondary | ICD-10-CM | POA: Diagnosis present

## 2013-11-20 DIAGNOSIS — E669 Obesity, unspecified: Secondary | ICD-10-CM | POA: Diagnosis present

## 2013-11-20 DIAGNOSIS — Z794 Long term (current) use of insulin: Secondary | ICD-10-CM

## 2013-11-20 DIAGNOSIS — M47817 Spondylosis without myelopathy or radiculopathy, lumbosacral region: Principal | ICD-10-CM | POA: Diagnosis present

## 2013-11-20 DIAGNOSIS — Z981 Arthrodesis status: Secondary | ICD-10-CM

## 2013-11-20 DIAGNOSIS — M51379 Other intervertebral disc degeneration, lumbosacral region without mention of lumbar back pain or lower extremity pain: Secondary | ICD-10-CM | POA: Diagnosis present

## 2013-11-20 DIAGNOSIS — M5137 Other intervertebral disc degeneration, lumbosacral region: Secondary | ICD-10-CM | POA: Diagnosis present

## 2013-11-20 HISTORY — PX: MAXIMUM ACCESS (MAS)POSTERIOR LUMBAR INTERBODY FUSION (PLIF) 1 LEVEL: SHX6368

## 2013-11-20 LAB — GLUCOSE, CAPILLARY: Glucose-Capillary: 243 mg/dL — ABNORMAL HIGH (ref 70–99)

## 2013-11-20 SURGERY — POSTERIOR LUMBAR FUSION 1 LEVEL
Anesthesia: General | Site: Back

## 2013-11-20 MED ORDER — BUPIVACAINE HCL (PF) 0.25 % IJ SOLN
INTRAMUSCULAR | Status: DC | PRN
  Administered 2013-11-20: 5 mL

## 2013-11-20 MED ORDER — PROPOFOL 10 MG/ML IV BOLUS
INTRAVENOUS | Status: DC | PRN
  Administered 2013-11-20: 200 mg via INTRAVENOUS
  Administered 2013-11-20: 80 mg via INTRAVENOUS

## 2013-11-20 MED ORDER — ACETAMINOPHEN 10 MG/ML IV SOLN
INTRAVENOUS | Status: DC | PRN
  Administered 2013-11-20: 1000 mg via INTRAVENOUS

## 2013-11-20 MED ORDER — OXYCODONE HCL 5 MG PO TABS
ORAL_TABLET | ORAL | Status: AC
  Filled 2013-11-20: qty 1

## 2013-11-20 MED ORDER — OXYCODONE HCL 5 MG/5ML PO SOLN: 5.0000 mg | Freq: Once | ORAL | Status: AC | PRN

## 2013-11-20 MED ORDER — LIDOCAINE HCL (CARDIAC) 20 MG/ML IV SOLN
INTRAVENOUS | Status: DC | PRN
  Administered 2013-11-20: 100 mg via INTRAVENOUS

## 2013-11-20 MED ORDER — ACETAMINOPHEN 650 MG RE SUPP: 650.0000 mg | RECTAL | Status: DC | PRN

## 2013-11-20 MED ORDER — PHENOL 1.4 % MT LIQD: 1.0000 | OROMUCOSAL | Status: DC | PRN

## 2013-11-20 MED ORDER — BACITRACIN 50000 UNITS IM SOLR
INTRAMUSCULAR | Status: DC | PRN
  Administered 2013-11-20: 10:00:00

## 2013-11-20 MED ORDER — ONDANSETRON HCL 4 MG/2ML IJ SOLN: 4.0000 mg | INTRAMUSCULAR | Status: DC | PRN

## 2013-11-20 MED ORDER — FENTANYL CITRATE 0.05 MG/ML IJ SOLN
INTRAMUSCULAR | Status: AC
  Filled 2013-11-20: qty 2

## 2013-11-20 MED ORDER — SODIUM CHLORIDE 0.9 % IJ SOLN: 3.0000 mL | INTRAMUSCULAR | Status: DC | PRN

## 2013-11-20 MED ORDER — ARTIFICIAL TEARS OP OINT
TOPICAL_OINTMENT | OPHTHALMIC | Status: DC | PRN
  Administered 2013-11-20: 1 via OPHTHALMIC

## 2013-11-20 MED ORDER — KETOROLAC TROMETHAMINE 30 MG/ML IJ SOLN
INTRAMUSCULAR | Status: DC | PRN
  Administered 2013-11-20: 15 mg via INTRAVENOUS

## 2013-11-20 MED ORDER — HYDROMORPHONE HCL PF 1 MG/ML IJ SOLN
INTRAMUSCULAR | Status: AC
  Administered 2013-11-20: 0.5 mg via INTRAVENOUS
  Filled 2013-11-20: qty 1

## 2013-11-20 MED ORDER — METOCLOPRAMIDE HCL 5 MG/ML IJ SOLN: 10.0000 mg | Freq: Once | INTRAMUSCULAR | Status: DC | PRN

## 2013-11-20 MED ORDER — ACETAMINOPHEN 10 MG/ML IV SOLN
INTRAVENOUS | Status: AC
  Filled 2013-11-20: qty 100

## 2013-11-20 MED ORDER — POTASSIUM CHLORIDE IN NACL 20-0.9 MEQ/L-% IV SOLN
INTRAVENOUS | Status: DC
  Administered 2013-11-20: 16:00:00 via INTRAVENOUS
  Filled 2013-11-20 (×10): qty 1000

## 2013-11-20 MED ORDER — GLIMEPIRIDE 4 MG PO TABS
4.0000 mg | ORAL_TABLET | Freq: Every day | ORAL | Status: DC
  Administered 2013-11-21 – 2013-11-24 (×4): 4 mg via ORAL
  Filled 2013-11-20 (×5): qty 1

## 2013-11-20 MED ORDER — OXYCODONE-ACETAMINOPHEN 5-325 MG PO TABS
1.0000 | ORAL_TABLET | ORAL | Status: DC | PRN
  Administered 2013-11-20 – 2013-11-21 (×4): 2 via ORAL
  Administered 2013-11-21: 1 via ORAL
  Filled 2013-11-20 (×5): qty 2

## 2013-11-20 MED ORDER — THROMBIN 5000 UNITS EX SOLR
OROMUCOSAL | Status: DC | PRN
  Administered 2013-11-20: 10:00:00 via TOPICAL

## 2013-11-20 MED ORDER — DIAZEPAM 5 MG PO TABS
ORAL_TABLET | ORAL | Status: AC
  Filled 2013-11-20: qty 1

## 2013-11-20 MED ORDER — ONDANSETRON HCL 4 MG/2ML IJ SOLN
INTRAMUSCULAR | Status: DC | PRN
  Administered 2013-11-20: 4 mg via INTRAVENOUS

## 2013-11-20 MED ORDER — MORPHINE SULFATE 2 MG/ML IJ SOLN
1.0000 mg | INTRAMUSCULAR | Status: DC | PRN
  Administered 2013-11-20 (×2): 4 mg via INTRAVENOUS
  Administered 2013-11-20 (×2): 2 mg via INTRAVENOUS
  Administered 2013-11-21 (×2): 4 mg via INTRAVENOUS
  Filled 2013-11-20 (×3): qty 2
  Filled 2013-11-20 (×2): qty 1
  Filled 2013-11-20: qty 2

## 2013-11-20 MED ORDER — FENTANYL CITRATE 0.05 MG/ML IJ SOLN
25.0000 ug | INTRAMUSCULAR | Status: DC | PRN
  Administered 2013-11-20 (×2): 50 ug via INTRAVENOUS

## 2013-11-20 MED ORDER — PANTOPRAZOLE SODIUM 40 MG PO TBEC
40.0000 mg | DELAYED_RELEASE_TABLET | Freq: Every day | ORAL | Status: DC
  Administered 2013-11-20 – 2013-11-24 (×5): 40 mg via ORAL
  Filled 2013-11-20 (×3): qty 1

## 2013-11-20 MED ORDER — PHENYLEPHRINE HCL 10 MG/ML IJ SOLN
INTRAMUSCULAR | Status: DC | PRN
  Administered 2013-11-20 (×2): 80 ug via INTRAVENOUS

## 2013-11-20 MED ORDER — ACETAMINOPHEN 325 MG PO TABS
650.0000 mg | ORAL_TABLET | ORAL | Status: DC | PRN
  Administered 2013-11-22 – 2013-11-24 (×6): 650 mg via ORAL
  Filled 2013-11-20 (×6): qty 2

## 2013-11-20 MED ORDER — HYDROCHLOROTHIAZIDE 12.5 MG PO CAPS
12.5000 mg | ORAL_CAPSULE | Freq: Every day | ORAL | Status: DC
  Administered 2013-11-20 – 2013-11-24 (×4): 12.5 mg via ORAL
  Filled 2013-11-20 (×6): qty 1

## 2013-11-20 MED ORDER — SENNA 8.6 MG PO TABS
1.0000 | ORAL_TABLET | Freq: Two times a day (BID) | ORAL | Status: DC
  Administered 2013-11-20 – 2013-11-24 (×9): 8.6 mg via ORAL
  Filled 2013-11-20 (×10): qty 1

## 2013-11-20 MED ORDER — SODIUM CHLORIDE 0.9 % IV SOLN
250.0000 mL | INTRAVENOUS | Status: DC
  Administered 2013-11-20: 250 mL via INTRAVENOUS

## 2013-11-20 MED ORDER — HYDROMORPHONE HCL PF 1 MG/ML IJ SOLN
0.2500 mg | INTRAMUSCULAR | Status: DC | PRN
  Administered 2013-11-20 (×4): 0.5 mg via INTRAVENOUS

## 2013-11-20 MED ORDER — THROMBIN 20000 UNITS EX SOLR
CUTANEOUS | Status: DC | PRN
  Administered 2013-11-20: 10:00:00 via TOPICAL

## 2013-11-20 MED ORDER — MENTHOL 3 MG MT LOZG: 1.0000 | LOZENGE | OROMUCOSAL | Status: DC | PRN

## 2013-11-20 MED ORDER — DIAZEPAM 5 MG PO TABS
5.0000 mg | ORAL_TABLET | Freq: Four times a day (QID) | ORAL | Status: DC | PRN
  Administered 2013-11-20 – 2013-11-24 (×15): 5 mg via ORAL
  Filled 2013-11-20 (×14): qty 1

## 2013-11-20 MED ORDER — LISINOPRIL-HYDROCHLOROTHIAZIDE 20-12.5 MG PO TABS: 1.0000 | ORAL_TABLET | ORAL | Status: DC

## 2013-11-20 MED ORDER — LACTATED RINGERS IV SOLN
INTRAVENOUS | Status: DC
  Administered 2013-11-20 (×3): via INTRAVENOUS

## 2013-11-20 MED ORDER — INSULIN GLARGINE 100 UNIT/ML ~~LOC~~ SOLN
20.0000 [IU] | SUBCUTANEOUS | Status: DC
  Administered 2013-11-21 – 2013-11-24 (×4): 20 [IU] via SUBCUTANEOUS
  Filled 2013-11-20 (×5): qty 0.2

## 2013-11-20 MED ORDER — CEFAZOLIN SODIUM 1-5 GM-% IV SOLN
1.0000 g | Freq: Three times a day (TID) | INTRAVENOUS | Status: AC
  Administered 2013-11-20 (×2): 1 g via INTRAVENOUS
  Filled 2013-11-20 (×3): qty 50

## 2013-11-20 MED ORDER — SUCCINYLCHOLINE CHLORIDE 20 MG/ML IJ SOLN
INTRAMUSCULAR | Status: DC | PRN
  Administered 2013-11-20: 140 mg via INTRAVENOUS

## 2013-11-20 MED ORDER — ALBUMIN HUMAN 5 % IV SOLN
INTRAVENOUS | Status: DC | PRN
  Administered 2013-11-20: 11:00:00 via INTRAVENOUS

## 2013-11-20 MED ORDER — LISINOPRIL 20 MG PO TABS
20.0000 mg | ORAL_TABLET | Freq: Every day | ORAL | Status: DC
  Administered 2013-11-20 – 2013-11-24 (×4): 20 mg via ORAL
  Filled 2013-11-20 (×6): qty 1

## 2013-11-20 MED ORDER — OXYCODONE HCL 5 MG PO TABS
5.0000 mg | ORAL_TABLET | Freq: Once | ORAL | Status: AC | PRN
  Administered 2013-11-20: 5 mg via ORAL

## 2013-11-20 MED ORDER — CELECOXIB 200 MG PO CAPS
200.0000 mg | ORAL_CAPSULE | Freq: Two times a day (BID) | ORAL | Status: DC
  Administered 2013-11-20 – 2013-11-24 (×9): 200 mg via ORAL
  Filled 2013-11-20 (×11): qty 1

## 2013-11-20 MED ORDER — MIDAZOLAM HCL 5 MG/5ML IJ SOLN
INTRAMUSCULAR | Status: DC | PRN
  Administered 2013-11-20: 2 mg via INTRAVENOUS

## 2013-11-20 MED ORDER — 0.9 % SODIUM CHLORIDE (POUR BTL) OPTIME
TOPICAL | Status: DC | PRN
  Administered 2013-11-20: 1000 mL

## 2013-11-20 MED ORDER — FENTANYL CITRATE 0.05 MG/ML IJ SOLN
INTRAMUSCULAR | Status: DC | PRN
  Administered 2013-11-20: 50 ug via INTRAVENOUS
  Administered 2013-11-20: 100 ug via INTRAVENOUS
  Administered 2013-11-20: 25 ug via INTRAVENOUS
  Administered 2013-11-20: 50 ug via INTRAVENOUS
  Administered 2013-11-20: 100 ug via INTRAVENOUS
  Administered 2013-11-20 (×3): 50 ug via INTRAVENOUS
  Administered 2013-11-20: 25 ug via INTRAVENOUS

## 2013-11-20 MED ORDER — SODIUM CHLORIDE 0.9 % IJ SOLN
3.0000 mL | Freq: Two times a day (BID) | INTRAMUSCULAR | Status: DC
  Administered 2013-11-22: 3 mL via INTRAVENOUS
  Administered 2013-11-23: 21:00:00 via INTRAVENOUS
  Administered 2013-11-23: 3 mL via INTRAVENOUS

## 2013-11-20 SURGICAL SUPPLY — 68 items
APL SKNCLS STERI-STRIP NONHPOA (GAUZE/BANDAGES/DRESSINGS) ×2
BAG DECANTER FOR FLEXI CONT (MISCELLANEOUS) ×3 IMPLANT
BENZOIN TINCTURE PRP APPL 2/3 (GAUZE/BANDAGES/DRESSINGS) ×3 IMPLANT
BLADE SURG ROTATE 9660 (MISCELLANEOUS) ×1 IMPLANT
BONE MATRIX OSTEOCEL PRO MED (Bone Implant) ×1 IMPLANT
BUR MATCHSTICK NEURO 3.0 LAGG (BURR) ×3 IMPLANT
CAGE COROENT LRG 8X9X28M SPINE (Cage) ×2 IMPLANT
CANISTER SUCT 3000ML (MISCELLANEOUS) ×3 IMPLANT
CLIP NEUROVISION LG (CLIP) ×1 IMPLANT
CONT SPEC 4OZ CLIKSEAL STRL BL (MISCELLANEOUS) ×6 IMPLANT
COVER BACK TABLE 24X17X13 BIG (DRAPES) IMPLANT
COVER TABLE BACK 60X90 (DRAPES) ×3 IMPLANT
DRAPE C-ARM 42X72 X-RAY (DRAPES) ×6 IMPLANT
DRAPE LAPAROTOMY 100X72X124 (DRAPES) ×3 IMPLANT
DRAPE POUCH INSTRU U-SHP 10X18 (DRAPES) ×3 IMPLANT
DRAPE SURG 17X23 STRL (DRAPES) ×3 IMPLANT
DRESSING TELFA 8X3 (GAUZE/BANDAGES/DRESSINGS) ×2 IMPLANT
DRSG OPSITE 4X5.5 SM (GAUZE/BANDAGES/DRESSINGS) ×5 IMPLANT
DRSG OPSITE POSTOP 4X6 (GAUZE/BANDAGES/DRESSINGS) ×1 IMPLANT
DURAPREP 26ML APPLICATOR (WOUND CARE) ×3 IMPLANT
ELECT REM PT RETURN 9FT ADLT (ELECTROSURGICAL) ×3
ELECTRODE REM PT RTRN 9FT ADLT (ELECTROSURGICAL) ×2 IMPLANT
EVACUATOR 1/8 PVC DRAIN (DRAIN) ×3 IMPLANT
GAUZE SPONGE 4X4 16PLY XRAY LF (GAUZE/BANDAGES/DRESSINGS) IMPLANT
GLOVE BIO SURGEON STRL SZ8 (GLOVE) ×6 IMPLANT
GLOVE BIOGEL PI IND STRL 7.0 (GLOVE) IMPLANT
GLOVE BIOGEL PI IND STRL 7.5 (GLOVE) IMPLANT
GLOVE BIOGEL PI INDICATOR 7.0 (GLOVE) ×1
GLOVE BIOGEL PI INDICATOR 7.5 (GLOVE) ×1
GLOVE ECLIPSE 7.0 STRL STRAW (GLOVE) ×1 IMPLANT
GLOVE SS BIOGEL STRL SZ 6.5 (GLOVE) IMPLANT
GLOVE SUPERSENSE BIOGEL SZ 6.5 (GLOVE) ×2
GOWN BRE IMP SLV AUR LG STRL (GOWN DISPOSABLE) ×2 IMPLANT
GOWN BRE IMP SLV AUR XL STRL (GOWN DISPOSABLE) ×5 IMPLANT
GOWN STRL REIN 2XL LVL4 (GOWN DISPOSABLE) IMPLANT
HEMOSTAT POWDER KIT SURGIFOAM (HEMOSTASIS) IMPLANT
HEMOSTAT POWDER SURGIFOAM 1G (HEMOSTASIS) ×1 IMPLANT
KIT BASIN OR (CUSTOM PROCEDURE TRAY) ×3 IMPLANT
KIT NDL NVM5 EMG ELECT (KITS) IMPLANT
KIT NEEDLE NVM5 EMG ELECT (KITS) ×2 IMPLANT
KIT NEEDLE NVM5 EMG ELECTRODE (KITS) ×1
KIT ROOM TURNOVER OR (KITS) ×3 IMPLANT
MILL MEDIUM DISP (BLADE) ×1 IMPLANT
NDL HYPO 25X1 1.5 SAFETY (NEEDLE) ×2 IMPLANT
NEEDLE HYPO 25X1 1.5 SAFETY (NEEDLE) ×3 IMPLANT
NS IRRIG 1000ML POUR BTL (IV SOLUTION) ×3 IMPLANT
PACK LAMINECTOMY NEURO (CUSTOM PROCEDURE TRAY) ×3 IMPLANT
PAD ARMBOARD 7.5X6 YLW CONV (MISCELLANEOUS) ×9 IMPLANT
ROD 30MM (Rod) ×2 IMPLANT
ROD PLIF MAS PB SPHERX 30 (Rod) IMPLANT
SCREW LOCK (Screw) ×12 IMPLANT
SCREW LOCK FXNS SPNE MAS PL (Screw) IMPLANT
SCREW SHANK 5.0X30MM (Screw) ×2 IMPLANT
SCREW SHANK 6.5X65 (Screw) ×2 IMPLANT
SCREW TULIP 5.5 (Screw) ×4 IMPLANT
SPONGE LAP 4X18 X RAY DECT (DISPOSABLE) IMPLANT
SPONGE SURGIFOAM ABS GEL 100 (HEMOSTASIS) ×3 IMPLANT
STRIP CLOSURE SKIN 1/2X4 (GAUZE/BANDAGES/DRESSINGS) ×5 IMPLANT
SUT VIC AB 0 CT1 18XCR BRD8 (SUTURE) ×2 IMPLANT
SUT VIC AB 0 CT1 8-18 (SUTURE) ×3
SUT VIC AB 2-0 CP2 18 (SUTURE) ×3 IMPLANT
SUT VIC AB 3-0 SH 8-18 (SUTURE) ×6 IMPLANT
SYR 20ML ECCENTRIC (SYRINGE) ×3 IMPLANT
TOWEL OR 17X24 6PK STRL BLUE (TOWEL DISPOSABLE) ×3 IMPLANT
TOWEL OR 17X26 10 PK STRL BLUE (TOWEL DISPOSABLE) ×3 IMPLANT
TRAP SPECIMEN MUCOUS 40CC (MISCELLANEOUS) ×1 IMPLANT
TRAY FOLEY CATH 16FRSI W/METER (SET/KITS/TRAYS/PACK) ×1 IMPLANT
WATER STERILE IRR 1000ML POUR (IV SOLUTION) ×3 IMPLANT

## 2013-11-20 NOTE — Anesthesia Procedure Notes (Signed)
Procedure Name: Intubation Date/Time: 11/20/2013 9:05 AM Performed by: Elon Alas Pre-anesthesia Checklist: Patient identified, Timeout performed, Emergency Drugs available, Suction available and Patient being monitored Patient Re-evaluated:Patient Re-evaluated prior to inductionOxygen Delivery Method: Circle system utilized Preoxygenation: Pre-oxygenation with 100% oxygen Intubation Type: IV induction Ventilation: Oral airway inserted - appropriate to patient size and Mask ventilation with difficulty Laryngoscope Size: Mac and 4 Grade View: Grade III Tube type: Oral Tube size: 8.0 mm Number of attempts: 1 Airway Equipment and Method: Stylet Placement Confirmation: positive ETCO2,  ETT inserted through vocal cords under direct vision and breath sounds checked- equal and bilateral Secured at: 24 cm Tube secured with: Tape Dental Injury: Teeth and Oropharynx as per pre-operative assessment

## 2013-11-20 NOTE — Plan of Care (Signed)
Problem: Consults Goal: Diagnosis - Spinal Surgery Thoraco/Lumbar Spine Fusion     

## 2013-11-20 NOTE — Anesthesia Preprocedure Evaluation (Addendum)
Anesthesia Evaluation  Patient identified by MRN, date of birth, ID band Patient awake    Reviewed: Allergy & Precautions, H&P , NPO status , Patient's Chart, lab work & pertinent test results, reviewed documented beta blocker date and time   Airway Mallampati: II TM Distance: >3 FB Neck ROM: full    Dental  (+) Dental Advisory Given   Pulmonary Current Smoker,  breath sounds clear to auscultation        Cardiovascular hypertension, Pt. on medications Rhythm:regular     Neuro/Psych negative neurological ROS  negative psych ROS   GI/Hepatic Neg liver ROS, GERD-  Medicated and Controlled,  Endo/Other  negative endocrine ROSdiabetes, Insulin DependentMorbid obesity  Renal/GU negative Renal ROS  negative genitourinary   Musculoskeletal   Abdominal   Peds  Hematology negative hematology ROS (+)   Anesthesia Other Findings See surgeon's H&P   Reproductive/Obstetrics negative OB ROS                          Anesthesia Physical Anesthesia Plan  ASA: III  Anesthesia Plan: General   Post-op Pain Management:    Induction: Intravenous  Airway Management Planned: Oral ETT  Additional Equipment:   Intra-op Plan:   Post-operative Plan: Extubation in OR  Informed Consent: I have reviewed the patients History and Physical, chart, labs and discussed the procedure including the risks, benefits and alternatives for the proposed anesthesia with the patient or authorized representative who has indicated his/her understanding and acceptance.   Dental Advisory Given and Dental advisory given  Plan Discussed with: CRNA, Surgeon and Anesthesiologist  Anesthesia Plan Comments:        Anesthesia Quick Evaluation

## 2013-11-20 NOTE — Anesthesia Postprocedure Evaluation (Signed)
Anesthesia Post Note  Patient: Thomas Donovan  Procedure(s) Performed: Procedure(s) (LRB): POSTERIOR LUMBAR FUSION 1 LEVEL (N/A) FOR MAXIMUM ACCESS (MAS) POSTERIOR LUMBAR INTERBODY FUSION LUMBAR FIVE-SACRAL ONE  Anesthesia type: General  Patient location: PACU  Post pain: Pain level controlled  Post assessment: Patient's Cardiovascular Status Stable  Last Vitals:  Filed Vitals:   11/20/13 1347  BP:   Pulse: 95  Temp: 36.1 C  Resp: 16    Post vital signs: Reviewed and stable  Level of consciousness: alert  Complications: No apparent anesthesia complications

## 2013-11-20 NOTE — Progress Notes (Signed)
Utilization review completed. Shalene Gallen, RN, BSN. 

## 2013-11-20 NOTE — Progress Notes (Signed)
Report given to Ronda RN   

## 2013-11-20 NOTE — OR Nursing (Signed)
Neuromonitoring provided by [employer]. Needle sites were assessed pre and post insertion, minor bleeding after removal stopped with pressure. RN inserted and removed needles.

## 2013-11-20 NOTE — Op Note (Signed)
11/20/2013  11:55 AM  PATIENT:  Thomas Donovan  52 y.o. 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 Decompressive lumbar laminectomy L5-S1 requiring more work than would be required for a simple exposure of the disk for PLIF in order to adequately decompress the neural elements and address the spinal stenosis, with right L4-5 hemilaminectomy, medial facetectomy, and foraminotomies (note that this level is separate from the PLIF level) 2. Posterior lumbar interbody fusion L5-S1 using PEEK interbody cages packed with morcellized allograft and autograft 3. Posterior fixation L5-S1 using cortical pedicle screws.    SURGEON:  Marikay Alar, MD  ASSISTANTS: Dr. Conchita Paris  ANESTHESIA:  General  EBL: 150 ml  Total I/O In: 2250 [I.V.:2000; IV Piggyback:250] Out: 250 [Urine:100; Blood:150]  BLOOD ADMINISTERED:none  DRAINS: Hemovac   INDICATION FOR PROCEDURE: This patient presented with severe right leg pain in an L5 distribution. An MRI which showed right-sided lateral recess stenosis at L4-5 and a large extremity fusion at L5-S1 compressing the right L5 nerve root and the extraforaminal space. He tried medical management including injection therapy without relief. Recommended a decompression with facetectomy to decompress the nerve root followed by instrumented fusion. Patient understood the risks, benefits, and alternatives and potential outcomes and wished to proceed.  PROCEDURE DETAILS:  The patient was brought to the operating room. After induction of generalized endotracheal anesthesia the patient was rolled into the prone position on chest rolls and all pressure points were padded. The patient's lumbar region was cleaned and then prepped with DuraPrep and draped in the usual sterile fashion. Anesthesia was  injected and then a dorsal midline incision was made and carried down to the lumbosacral fascia. The fascia was opened and the paraspinous musculature was taken down in a subperiosteal fashion to expose L5-S1 as well as L4-5. A self-retaining retractor was placed. Intraoperative fluoroscopy confirmed my level, and I started with placement of the L5 cortical pedicle screws. The pedicle screw entry zones were identified utilizing surface landmarks and  AP and lateral fluoroscopy. I scored the cortex with the high-speed drill and then used the hand drill and EMG monitoring to drill an upward and outward direction into the pedicle. I then tapped line to line, and the tap was also monitored. I then placed a 5-0 by 30 mm cortical pedicle screw into the pedicles of L5 bilaterally. I then turned my attention to the decompression and the spinous process was removed and complete lumbar laminectomies, hemi- facetectomies, and foraminotomies were performed at L5-S1. The patient had significant spinal stenosis and this required more work than would be required for a simple exposure of the disc for posterior lumbar interbody fusion. Much more generous decompression was undertaken in order to adequately decompress the neural elements and address the patient's leg pain. The yellow ligament was removed to expose the underlying dura and nerve roots, and generous foraminotomies were performed to adequately decompress the neural elements. Both the exiting and traversing nerve roots were decompressed on both sides until a coronary dilator passed easily along the nerve roots. I also performed a right L4-5 hemilaminectomy, medial facetectomy, and foraminotomy to decompress the lateral recess at L4-5 on the right. I inspected the L4-5 disc space and found no disc herniation. Once the decompression was complete, I turned my attention to the posterior lower lumbar interbody fusion. The epidural venous vasculature was  coagulated and cut  sharply. Disc space was incised and the initial discectomy was performed with pituitary rongeurs. The disc space was distracted with sequential distractors to a height of 8 mm. I found a large calcified disc herniation in the extra foraminal space at L5-S1 on the right. I incised the disc space performed a discectomy but also used a curette and bone biting instruments removed the large osteophyte from underneath the L5 nerve root. Once this decompression was complete a coronary dilator passed easily under and over the L5 nerve root. We then used a series of scrapers and shavers to prepare the endplates for fusion. The midline was prepared with Epstein curettes. Once the complete discectomy was finished, we packed an appropriate sized peek interbody cage (8 mm x 28 mm , 8 lordotic ) with local autograft and morcellized allograft, gently retracted the nerve root, and tapped the cage into position at L5 S1.  The midline between the cages was packed with morselized autograft and allograft. We then turned our attention to the placement of the lower pedicle screws. The pedicle screw entry zones were identified utilizing surface landmarks and fluoroscopy. I drilled into each pedicle utilizing the hand drill and EMG monitoring, and tapped each pedicle with the appropriate tap. We palpated with a ball probe to assure no break in the cortex. We then placed 6-5 x 35 mm pedicle screws into the pedicles bilaterally at S1. . We then placed lordotic rods into the multiaxial screw heads of the pedicle screws and locked these in position with the locking caps and anti-torque device. We then checked our construct with AP and lateral fluoroscopy. Irrigated with copious amounts of bacitracin-containing saline solution. Placed a medium Hemovac drain through separate stab incision. Inspected the nerve roots once again to assure adequate decompression, lined to the dura with Gelfoam, and closed the muscle and the fascia with 0 Vicryl.  Closed the subcutaneous tissues with 2-0 Vicryl and subcuticular tissues with 3-0 Vicryl. The skin was closed with benzoin and Steri-Strips. Dressing was then applied, the patient was awakened from general anesthesia and transported to the recovery room in stable condition. At the end of the procedure all sponge, needle and instrument counts were correct.   PLAN OF CARE: Admit to inpatient   PATIENT DISPOSITION:  PACU - hemodynamically stable.   Delay start of Pharmacological VTE agent (>24hrs) due to surgical blood loss or risk of bleeding:  yes

## 2013-11-20 NOTE — Preoperative (Signed)
Beta Blockers   Reason not to administer Beta Blockers:Not Applicable 

## 2013-11-20 NOTE — Transfer of Care (Signed)
Immediate Anesthesia Transfer of Care Note  Patient: Thomas Donovan  Procedure(s) Performed: Procedure(s) with comments: POSTERIOR LUMBAR FUSION 1 LEVEL (N/A) FOR MAXIMUM ACCESS (MAS) POSTERIOR LUMBAR INTERBODY FUSION LUMBAR FIVE-SACRAL ONE - FOR MAXIMUM ACCESS (MAS) POSTERIOR LUMBAR INTERBODY FUSION LUMBAR FIVE-SACRAL ONE  Patient Location: PACU  Anesthesia Type:General  Level of Consciousness: awake, alert  and oriented  Airway & Oxygen Therapy: Patient Spontanous Breathing and Patient connected to nasal cannula oxygen  Post-op Assessment: Report given to PACU RN, Post -op Vital signs reviewed and stable and Patient moving all extremities X 4  Post vital signs: Reviewed and stable  Complications: No apparent anesthesia complications

## 2013-11-20 NOTE — H&P (Signed)
Subjective: Patient is a 52 y.o. male admitted for PLIF L5-S1. Onset of symptoms was several months ago, gradually worsening since that time.  The pain is rated severe, and is located at the across the lower back and radiates to legs. The pain is described as aching and occurs all day. The symptoms have been progressive. Symptoms are exacerbated by exercise. MRI or CT showed Stenosis L5-s1   Past Medical History  Diagnosis Date  . Degeneration of lumbar intervertebral disc   . Diabetes mellitus   . Gout   . Hypertension   . Chronic back pain   . Cold     recent rx  . History of kidney stones   . GERD (gastroesophageal reflux disease)     Past Surgical History  Procedure Laterality Date  . Tonsillectomy    . Back surgery      Prior to Admission medications   Medication Sig Start Date End Date Taking? Authorizing Provider  colchicine 0.6 MG tablet Take 0.6 mg by mouth daily as needed.   Yes Historical Provider, MD  doxycycline (VIBRAMYCIN) 100 MG capsule Take 100 mg by mouth 2 (two) times daily.   Yes Historical Provider, MD  fluticasone (FLONASE) 50 MCG/ACT nasal spray Place into both nostrils 1 day or 1 dose.   Yes Historical Provider, MD  glimepiride (AMARYL) 4 MG tablet Take 4 mg by mouth daily with breakfast.   Yes Historical Provider, MD  HYDROcodone-acetaminophen (NORCO) 10-325 MG per tablet Take 1 tablet by mouth every 6 (six) hours as needed.   Yes Historical Provider, MD  insulin glargine (LANTUS) 100 UNIT/ML injection Inject 26-30 Units into the skin every morning. *Patient only takes if sugar levels are elevated*   Yes Historical Provider, MD  lisinopril-hydrochlorothiazide (PRINZIDE,ZESTORETIC) 20-12.5 MG per tablet Take 1 tablet by mouth every morning.    Yes Historical Provider, MD  omeprazole (PRILOSEC) 40 MG capsule Take 40 mg by mouth 2 (two) times daily.   Yes Historical Provider, MD   Allergies  Allergen Reactions  . Tramadol Hives         History  Substance  Use Topics  . Smoking status: Current Every Day Smoker -- 0.50 packs/day for 10 years    Types: Cigarettes  . Smokeless tobacco: Not on file  . Alcohol Use: 7.2 oz/week    12 Cans of beer per week    History reviewed. No pertinent family history.   Review of Systems  Positive ROS: neg  All other systems have been reviewed and were otherwise negative with the exception of those mentioned in the HPI and as above.  Objective: Vital signs in last 24 hours: Temp:  [97.3 F (36.3 C)] 97.3 F (36.3 C) (11/26 0727) Pulse Rate:  [92] 92 (11/26 0727) Resp:  [18] 18 (11/26 0727) BP: (157)/(81) 157/81 mmHg (11/26 0727) SpO2:  [98 %] 98 % (11/26 0727)  General Appearance: Alert, cooperative, no distress, appears stated age Head: Normocephalic, without obvious abnormality, atraumatic Eyes: PERRL, conjunctiva/corneas clear, EOM's intact    Neck: Supple, symmetrical, trachea midline Back: Symmetric, no curvature, ROM normal, no CVA tenderness Lungs:  respirations unlabored Heart: Regular rate and rhythm Abdomen: Soft, non-tender Extremities: Extremities normal, atraumatic, no cyanosis or edema Pulses: 2+ and symmetric all extremities Skin: Skin color, texture, turgor normal, no rashes or lesions  NEUROLOGIC:   Mental status: Alert and oriented x4,  no aphasia, good attention span, fund of knowledge, and memory Motor Exam - grossly normal Sensory Exam - grossly  normal Reflexes: trace Coordination - grossly normal Gait - grossly normal Balance - grossly normal Cranial Nerves: I: smell Not tested  II: visual acuity  OS: nl    OD: nl  II: visual fields Full to confrontation  II: pupils Equal, round, reactive to light  III,VII: ptosis None  III,IV,VI: extraocular muscles  Full ROM  V: mastication Normal  V: facial light touch sensation  Normal  V,VII: corneal reflex  Present  VII: facial muscle function - upper  Normal  VII: facial muscle function - lower Normal  VIII: hearing  Not tested  IX: soft palate elevation  Normal  IX,X: gag reflex Present  XI: trapezius strength  5/5  XI: sternocleidomastoid strength 5/5  XI: neck flexion strength  5/5  XII: tongue strength  Normal    Data Review Lab Results  Component Value Date   WBC 9.9 11/18/2013   HGB 15.4 11/18/2013   HCT 43.6 11/18/2013   MCV 92.0 11/18/2013   PLT 241 11/18/2013   Lab Results  Component Value Date   NA 136 11/18/2013   K 3.6 11/18/2013   CL 98 11/18/2013   CO2 26 11/18/2013   BUN 20 11/18/2013   CREATININE 1.10 11/18/2013   GLUCOSE 158* 11/18/2013   Lab Results  Component Value Date   INR 0.96 11/18/2013    Assessment/Plan: Patient admitted for PLIF l5-s1. Patient has failed a reasonable attempt at conservative therapy.  I explained the condition and procedure to the patient and answered any questions.  Patient wishes to proceed with procedure as planned. Understands risks/ benefits and typical outcomes of procedure.   Gailen Venne S 11/20/2013 8:52 AM

## 2013-11-21 LAB — GLUCOSE, CAPILLARY
Glucose-Capillary: 175 mg/dL — ABNORMAL HIGH (ref 70–99)
Glucose-Capillary: 186 mg/dL — ABNORMAL HIGH (ref 70–99)
Glucose-Capillary: 237 mg/dL — ABNORMAL HIGH (ref 70–99)

## 2013-11-21 MED ORDER — HYDROMORPHONE HCL PF 1 MG/ML IJ SOLN
1.0000 mg | INTRAMUSCULAR | Status: DC | PRN
  Administered 2013-11-21 – 2013-11-23 (×16): 1 mg via INTRAVENOUS
  Filled 2013-11-21 (×16): qty 1

## 2013-11-21 MED ORDER — NICOTINE 14 MG/24HR TD PT24
14.0000 mg | MEDICATED_PATCH | Freq: Every day | TRANSDERMAL | Status: DC
  Administered 2013-11-21 – 2013-11-24 (×4): 14 mg via TRANSDERMAL
  Filled 2013-11-21 (×4): qty 1

## 2013-11-21 MED ORDER — OXYCODONE HCL 5 MG PO TABS
10.0000 mg | ORAL_TABLET | ORAL | Status: DC | PRN
  Administered 2013-11-21 – 2013-11-23 (×11): 10 mg via ORAL
  Filled 2013-11-21 (×11): qty 2

## 2013-11-21 NOTE — Evaluation (Signed)
Physical Therapy Evaluation Patient Details Name: Thomas Donovan MRN: [id number] DOB: 09-09-1961 Today's Date: 11/21/2013 Time: 7829-5621 PT Time Calculation (min): 20 min  PT Assessment / Plan / Recommendation History of Present Illness  s/p PLIF L5-S1  Clinical Impression  Patient is s/p PLIF L5S1surgery resulting in the deficits listed below (see PT Problem List).  Patient will benefit from skilled PT to increase their independence and safety with mobility (while adhering to their precautions) to allow discharge home with wife and son. Pt motivated, anticipate good progress.      PT Assessment  Patient needs continued PT services    Follow Up Recommendations  No PT follow up;Supervision for mobility/OOB;Supervision - Intermittent;Other (comment) (possible OPPT as appropriate)    Does the patient have the potential to tolerate intense rehabilitation      Barriers to Discharge        Equipment Recommendations  3in1 (PT)    Recommendations for Other Services     Frequency Min 5X/week    Precautions / Restrictions Precautions Precautions: Back Precaution Booklet Issued: Yes (comment) Precaution Comments: pt given handout and educated on back precautions  Required Braces or Orthoses: Spinal Brace Spinal Brace: Lumbar corset;Applied in sitting position Restrictions Weight Bearing Restrictions: No   Pertinent Vitals/Pain 9/10; pt premedicated per RN.       Mobility  Bed Mobility Bed Mobility: Rolling Right;Right Sidelying to Sit Rolling Right: 4: Min guard Right Sidelying to Sit: 4: Min guard;HOB flat Details for Bed Mobility Assistance: cues for log rolling technique  Transfers Transfers: Sit to Stand;Stand to Sit Sit to Stand: 4: Min assist;From bed;From chair/3-in-1;With upper extremity assist;With armrests Stand to Sit: 4: Min guard;To chair/3-in-1;With armrests;With upper extremity assist Details for Transfer Assistance: cues to adhere to back precautions; min  (A) to maintain balance and acheive upright standing position; incr time secondary to pain.  Ambulation/Gait Ambulation/Gait Assistance: 4: Min guard Ambulation Distance (Feet): 80 Feet Assistive device: 1 person hand held assist;Other (Comment) (UE supported with IV pole; HHA PRN) Ambulation/Gait Assistance Details: pt with decreased stride length and narrow BOS; pt guarded secondary to pain at surgical incision; pt cued to increase BOS for stability; pt bracing UE with IV pole; no LOB noted; may need to assess gt with SPC  Gait Pattern: Step-through pattern;Decreased stride length;Shuffle;Narrow base of support Gait velocity: very decreased due to pain Stairs: No Wheelchair Mobility Wheelchair Mobility: No         PT Diagnosis: Abnormality of gait;Acute pain  PT Problem List: Decreased mobility;Decreased balance;Decreased knowledge of use of DME;Decreased knowledge of precautions;Pain PT Treatment Interventions: Stair training;Therapeutic activities;Gait training;Functional mobility training;DME instruction;Therapeutic exercise;Balance training;Neuromuscular re-education;Patient/family education     PT Goals(Current goals can be found in the care plan section) Acute Rehab PT Goals Patient Stated Goal: to have less pain PT Goal Formulation: With patient Time For Goal Achievement: 11/28/13 Potential to Achieve Goals: Good  Visit Information  Last PT Received On: 11/21/13 Assistance Needed: +1 PT/OT Co-Evaluation/Treatment: Yes History of Present Illness: s/p PLIF L5-S1       Prior Functioning  Home Living Family/patient expects to be discharged to:: Private residence Living Arrangements: Spouse/significant other Available Help at Discharge: Family;Available 24 hours/day Type of Home: House Home Access: Stairs to enter [employer] of Steps: 3 Entrance Stairs-Rails: Can reach both Home Layout: One level Home Equipment: Grab bars - tub/shower Prior Function Level  of Independence: Independent Comments: has tub shower and standard height toilet seats  Communication Communication: No difficulties Dominant  Hand: Left    Cognition  Cognition Arousal/Alertness: Awake/alert Behavior During Therapy: WFL for tasks assessed/performed Overall Cognitive Status: Within Functional Limits for tasks assessed    Extremity/Trunk Assessment Upper Extremity Assessment Upper Extremity Assessment: Defer to OT evaluation Lower Extremity Assessment Lower Extremity Assessment: Overall WFL for tasks assessed Cervical / Trunk Assessment Cervical / Trunk Assessment: Normal   Balance Balance Balance Assessed: Yes Static Sitting Balance Static Sitting - Balance Support: Feet supported;No upper extremity supported Static Sitting - Level of Assistance: 7: Independent  End of Session PT - End of Session Equipment Utilized During Treatment: Gait belt;Back brace Activity Tolerance: Patient tolerated treatment well Patient left: in chair;with call bell/phone within reach;with family/visitor present Nurse Communication: Mobility status  GP     Donell Sievert, [place] [phone] 11/21/2013, 9:42 AM

## 2013-11-21 NOTE — Progress Notes (Addendum)
Patient calls for pain medicine as soon as he can get it but no amount of pain medication seems to reduce his pain. He is still requesting IV dilaudid, oxy IR and valium.   I have offered to help him reposition and I have offered an ice pack both of which he declined.  Lance Bosch, RN

## 2013-11-21 NOTE — Progress Notes (Signed)
Patient's family came to visit him.  Loud arguing coming from the room as well as loud profanity.  Asked patient and family to lower their voices.  Will continue to monitor.  Lance Bosch, RN

## 2013-11-21 NOTE — Evaluation (Signed)
Occupational Therapy Evaluation Patient Details Name: KYLAN LIBERATI MRN: [id number] DOB: 1961-10-26 Today's Date: 11/21/2013 Time: 4098-1191 OT Time Calculation (min): 20 min  OT Assessment / Plan / Recommendation History of present illness s/p PLIF L5-S1   Clinical Impression   This 52 yo male admitted with above presents to acute OT with need of one more session of OT for further education.    OT Assessment  Patient needs continued OT Services    Follow Up Recommendations  No OT follow up       Equipment Recommendations  3 in 1 bedside comode       Frequency  Min 2X/week    Precautions / Restrictions Precautions Precautions: Back Precaution Booklet Issued: Yes (comment) Precaution Comments: pt given handout and educated on back precautions  Required Braces or Orthoses: Spinal Brace Spinal Brace: Lumbar corset;Applied in sitting position Restrictions Weight Bearing Restrictions: No   Pertinent Vitals/Pain 9/10; repositioned; PCA encouarged    ADL  Equipment Used: Gait belt;Back brace Transfers/Ambulation Related to ADLs: Min guard A for all without AD ADL Comments: Pt reports that wife will A with LBADLs until he can do them for himself; pt min guard A for transfer to 3n1 over toliet, recommended he use wet wipes for toileting hygien; S for donning brace    OT Diagnosis: Generalized weakness;Acute pain  OT Problem List: Decreased range of motion;Decreased knowledge of use of DME or AE;Decreased knowledge of precautions;Pain OT Treatment Interventions: Self-care/ADL training;DME and/or AE instruction;Balance training;Patient/family education   OT Goals(Current goals can be found in the care plan section) Acute Rehab OT Goals Patient Stated Goal: to have less pain OT Goal Formulation: With patient Time For Goal Achievement: 11/28/13 Potential to Achieve Goals: Good  Visit Information  Last OT Received On: 11/21/13 Assistance Needed: +1 PT/OT  Co-Evaluation/Treatment: Yes History of Present Illness: s/p PLIF L5-S1       Prior Functioning     Home Living Family/patient expects to be discharged to:: Private residence Living Arrangements: Spouse/significant other Available Help at Discharge: Family;Available 24 hours/day Type of Home: House Home Access: Stairs to enter [employer] of Steps: 3 Entrance Stairs-Rails: Can reach both Home Layout: One level Home Equipment: Grab bars - tub/shower Prior Function Level of Independence: Independent Comments: has tub shower and standard height toilet seats  Communication Communication: No difficulties Dominant Hand: Left         Vision/Perception Vision - History Patient Visual Report: No change from baseline   Cognition  Cognition Arousal/Alertness: Awake/alert Behavior During Therapy: WFL for tasks assessed/performed Overall Cognitive Status: Within Functional Limits for tasks assessed    Extremity/Trunk Assessment Upper Extremity Assessment Upper Extremity Assessment: Overall WFL for tasks assessed Lower Extremity Assessment Lower Extremity Assessment: Overall WFL for tasks assessed Cervical / Trunk Assessment Cervical / Trunk Assessment: Normal     Mobility Bed Mobility Bed Mobility: Rolling Right;Right Sidelying to Sit Rolling Right: 4: Min guard Right Sidelying to Sit: 4: Min guard;HOB flat Details for Bed Mobility Assistance: cues for log rolling technique  Transfers Sit to Stand: 4: Min assist;From bed;From chair/3-in-1;With upper extremity assist;With armrests Stand to Sit: 4: Min guard;To chair/3-in-1;With armrests;With upper extremity assist Details for Transfer Assistance: cues to adhere to back precautions; min (A) to maintain balance and acheive upright standing position; incr time secondary to pain.         Balance Balance Balance Assessed: Yes Static Sitting Balance Static Sitting - Balance Support: Feet supported;No upper  extremity supported Static Sitting -  Level of Assistance: 7: Independent   End of Session OT - End of Session Equipment Utilized During Treatment: Gait belt;Back brace Activity Tolerance: Patient tolerated treatment well Patient left: in chair;with call bell/phone within reach;with family/visitor present Nurse Communication: Mobility status       Evette Georges [phone] 11/21/2013, 10:50 AM

## 2013-11-21 NOTE — Progress Notes (Signed)
Patient ID: Thomas Donovan, male   DOB: 24-Feb-1961, 52 y.o.   MRN: [id number] Afeb, vss C/o markled incisional pain. Will change meds a bit No new neuro issues Will try to increase activity today.

## 2013-11-22 LAB — GLUCOSE, CAPILLARY
Glucose-Capillary: 135 mg/dL — ABNORMAL HIGH (ref 70–99)
Glucose-Capillary: 182 mg/dL — ABNORMAL HIGH (ref 70–99)

## 2013-11-22 NOTE — Progress Notes (Signed)
Patient ID: Thomas Donovan, male   DOB: 1961/05/26, 52 y.o.   MRN: [id number] Afeb, vss Still c/o lots of incisional pain Slowly increasing activity. Wound clean and dry. Continue present rx.

## 2013-11-22 NOTE — Progress Notes (Signed)
Physical Therapy Treatment Patient Details Name: Thomas Donovan MRN: [id number] DOB: 1961/03/26 Today's Date: 11/22/2013 Time: 1136-1200 PT Time Calculation (min): 24 min  PT Assessment / Plan / Recommendation  History of Present Illness s/p PLIF L5-S1   PT Comments   Pt progressing well with ambulation with use of RW.  Pt felt safer and more confident with RW than without; pt educated on importance of ambulating rather than staying in recliner/bed all day. Pt verbalized understanding of bed mobility education, saying "that's what I do." Pt demonstrated good safety awareness with mobility despite pain.  Pt required few cues to maintain precautions throughout tx.  Wife was present and supportive throughout tx and said that she would be at home once pt d/c'd.    Follow Up Recommendations  No PT follow up;Supervision for mobility/OOB;Supervision - Intermittent     Does the patient have the potential to tolerate intense rehabilitation     Barriers to Discharge        Equipment Recommendations  Rolling walker with 5" wheels    Recommendations for Other Services    Frequency Min 5X/week   Progress towards PT Goals Progress towards PT goals: Progressing toward goals  Plan Current plan remains appropriate    Precautions / Restrictions Precautions Precautions: Back Precaution Comments: pt recalled 1/3 precautions; pt and wife education on back precautions Required Braces or Orthoses: Spinal Brace Spinal Brace: Lumbar corset;Applied in sitting position Restrictions Weight Bearing Restrictions: No   Pertinent Vitals/Pain 9/10 LBP; verbalized that he received pain meds prior to tx.    Mobility  Bed Mobility Bed Mobility: Not assessed Details for Bed Mobility Assistance: Pt received in recliner and wanted to return to it  Transfers Transfers: Sit to Stand;Stand to Sit Sit to Stand: From chair/3-in-1;With armrests;4: Min guard Stand to Sit: To chair/3-in-1;4: Min guard;With  armrests Details for Transfer Assistance: vc's for hand placement and technique with RW Ambulation/Gait Ambulation/Gait Assistance: 4: Min guard;5: Supervision Ambulation Distance (Feet): 250 Feet Assistive device: Rolling walker Ambulation/Gait Assistance Details: Pt asked for RW due to BLE weakness today, increased unsteadiness, and pain.  Cues for use of RW; cues to decrease shoulder elevation and to stay closer to RW Gait Pattern: Step-through pattern;Decreased stride length Gait velocity: decreased Stairs: No    Exercises     PT Diagnosis:    PT Problem List:   PT Treatment Interventions:     PT Goals (current goals can now be found in the care plan section)    Visit Information  Last PT Received On: 11/22/13 Assistance Needed: +1 History of Present Illness: s/p PLIF L5-S1    Subjective Data      Cognition  Cognition Arousal/Alertness: Awake/alert Behavior During Therapy: WFL for tasks assessed/performed Overall Cognitive Status: Within Functional Limits for tasks assessed    Balance     End of Session PT - End of Session Equipment Utilized During Treatment: Gait belt;Back brace Activity Tolerance: Patient tolerated treatment well Patient left: in chair;with call bell/phone within reach;with family/visitor present Nurse Communication: Mobility status   GP     Ernestina Columbia, SPTA 11/22/2013, 12:44 PM

## 2013-11-22 NOTE — Progress Notes (Signed)
Occupational Therapy Treatment  And Discharge Patient Details Name: Thomas Donovan MRN: [id number] DOB: 18-Dec-1961 Today's Date: 11/22/2013 Time: 7829-5621 OT Time Calculation (min): 31 min  OT Assessment / Plan / Recommendation  History of present illness s/p PLIF L5-S1   OT comments  All education has been completed with this patient, acute OT will sign off.  Follow Up Recommendations  No OT follow up       Equipment Recommendations  3 in 1 bedside comode       Frequency Min 2X/week   Progress towards OT Goals Progress towards OT goals: Goals met/education completed, patient discharged from OT  Plan Discharge plan remains appropriate    Precautions / Restrictions Precautions Precautions: Back Precaution Comments: pt recalled 1/3 precautions; pt and wife education on back precautions Required Braces or Orthoses: Spinal Brace Spinal Brace: Lumbar corset;Applied in sitting position Restrictions Weight Bearing Restrictions: No   Pertinent Vitals/Pain 8/10 back; RN made aware and pt repositioned    ADL  Tub/Shower Transfer: Supervision/safety Tub/Shower Transfer Method: Science writer:  (3n1 in tub) Equipment Used: Back brace;Rolling walker Transfers/Ambulation Related to ADLs: S for all with RW. Pt ambulated from his his room (5N18 to the neuro gym) ADL Comments: Dons/doffs brace independently at EOB.n Wife to A will LBADLs until he can do them for himself      OT Goals(current goals can now be found in the care plan section)    Visit Information  Last OT Received On: 11/22/13 Assistance Needed: +1 History of Present Illness: s/p PLIF L5-S1          Cognition  Cognition Arousal/Alertness: Awake/alert Behavior During Therapy: WFL for tasks assessed/performed Overall Cognitive Status: Within Functional Limits for tasks assessed    Mobility  Bed Mobility Bed Mobility: Rolling Right;Right Sidelying to Sit;Sitting - Scoot to Edge of  Bed;Sit to Sidelying Right Rolling Right: 6: Modified independent (Device/Increase time);With rail Right Sidelying to Sit: HOB elevated;With rails Sitting - Scoot to Edge of Bed: 6: Modified independent (Device/Increase time);With rail Sit to Sidelying Right: 6: Modified independent (Device/Increase time);With rail;HOB flat Details for Bed Mobility Assistance: Pt received in recliner and wanted to return to it  Transfers Transfers: Sit to Stand;Stand to Sit Sit to Stand: 5: Supervision;With upper extremity assist;From bed Stand to Sit: 5: Supervision;With upper extremity assist;With armrests;To chair/3-in-1 Details for Transfer Assistance: vc's for hand placement and technique with RW          End of Session OT - End of Session Equipment Utilized During Treatment: Gait belt;Rolling walker;Back brace Activity Tolerance: Patient tolerated treatment well Patient left: in bed;with call bell/phone within reach Nurse Communication: Patient requests pain meds       Evette Georges [phone] 11/22/2013, 2:28 PM

## 2013-11-22 NOTE — Progress Notes (Signed)
Seen and agreed 11/22/2013 Nakota Ackert Elizabeth PTA [phone] pager [phone] office    

## 2013-11-23 LAB — GLUCOSE, CAPILLARY: Glucose-Capillary: 148 mg/dL — ABNORMAL HIGH (ref 70–99)

## 2013-11-23 MED ORDER — HYDROMORPHONE HCL 2 MG PO TABS
2.0000 mg | ORAL_TABLET | ORAL | Status: DC | PRN
  Administered 2013-11-23 – 2013-11-24 (×6): 2 mg via ORAL
  Filled 2013-11-23 (×7): qty 1

## 2013-11-23 MED ORDER — METHOCARBAMOL 500 MG PO TABS
500.0000 mg | ORAL_TABLET | Freq: Four times a day (QID) | ORAL | Status: DC | PRN
  Administered 2013-11-23 – 2013-11-24 (×3): 500 mg via ORAL
  Filled 2013-11-23 (×3): qty 1

## 2013-11-23 MED ORDER — HYDROMORPHONE HCL 2 MG PO TABS
2.0000 mg | ORAL_TABLET | Freq: Once | ORAL | Status: AC
  Administered 2013-11-23: 2 mg via ORAL

## 2013-11-23 NOTE — Progress Notes (Signed)
Patient visibly upset about the changes to his pain medication, he says he is now prescribed basically "an aspirin" for pain relief. Patient has po Dilaudid, valium, robaxin, and tylenol on board. He said "at home they were giving me 4 mg of dilaudid 6 times a day!" On home med list however, all i see listed is Norco. I explained to patient that physicians generally discontinue IV meds before a patient goes home, however he is still upset. I said "lets at least give this a try and take the meds that are now ordered." Will cont to monitor

## 2013-11-23 NOTE — Progress Notes (Signed)
Met patient this morning, complaining of terrible back pain, gave pain medications, patient almost immediately asking for more on top of it, this appears consistent with his hospital stay so far. Will cont to monitor

## 2013-11-23 NOTE — Progress Notes (Signed)
Subjective: Patient reports Patient complains of poor pain control. No stent oxycodone does not work for him.  Objective: Vital signs in last 24 hours: Temp:  [97.9 F (36.6 C)-99.5 F (37.5 C)] 98.1 F (36.7 C) (11/29 1335) Pulse Rate:  [83-112] 99 (11/29 1335) Resp:  [16-22] 20 (11/29 1335) BP: (107-138)/(63-80) 120/64 mmHg (11/29 1335) SpO2:  [97 %-99 %] 97 % (11/29 1335)  Intake/Output from previous day: 11/28 0701 - 11/29 0700 In: 1260 [P.O.:1260] Out: 590 [Urine:590] Intake/Output this shift: Total I/O In: 480 [P.O.:480] Out: 250 [Urine:250]  Incision is clean and dry. Motor function appears intact in lower extremities.  Lab Results: No results found for this basename: WBC, HGB, HCT, PLT,  in the last 72 hours BMET No results found for this basename: NA, K, CL, CO2, GLUCOSE, BUN, CREATININE, CALCIUM,  in the last 72 hours  Studies/Results: No results found.  Assessment/Plan: Stable postop.  LOS: 3 days  DC oxycodone and give patient oral Dilaudid.   Leevi Cullars J 11/23/2013, 3:06 PM

## 2013-11-23 NOTE — Progress Notes (Signed)
Physical Therapy Treatment Patient Details Name: Thomas Donovan MRN: [id number] DOB: 18-Sep-1961 Today's Date: 11/23/2013 Time: 0901-0920 PT Time Calculation (min): 19 min  PT Assessment / Plan / Recommendation  History of Present Illness s/p PLIF L5-S1   PT Comments   Patient limited by pain this session. Stated new onset of buttocks pain, radiating down to legs. RN aware and gave more medication. Unable to attempt steps this session but patient agreeable to ambulate as tolerated. Will need to attempt steps next session in prep for DC home.   Follow Up Recommendations  No PT follow up;Supervision for mobility/OOB;Supervision - Intermittent     Does the patient have the potential to tolerate intense rehabilitation     Barriers to Discharge        Equipment Recommendations  Rolling walker with 5" wheels    Recommendations for Other Services    Frequency Min 5X/week   Progress towards PT Goals Progress towards PT goals: Progressing toward goals  Plan Current plan remains appropriate    Precautions / Restrictions Precautions Precautions: Back Precaution Comments: Patient able to recall all precautions this morning Required Braces or Orthoses: Spinal Brace Spinal Brace: Lumbar corset;Applied in sitting position Restrictions Weight Bearing Restrictions: No   Pertinent Vitals/Pain 9/10 buttocks pain. RN aware and medicated    Mobility  Bed Mobility Rolling Right: 6: Modified independent (Device/Increase time);With rail Sitting - Scoot to Edge of Bed: 6: Modified independent (Device/Increase time);With rail Transfers Sit to Stand: 5: Supervision;With upper extremity assist;From bed Stand to Sit: 5: Supervision;With upper extremity assist;With armrests;To chair/3-in-1 Details for Transfer Assistance: vc's for hand placement and technique with RW Ambulation/Gait Ambulation/Gait Assistance: 4: Min guard Ambulation Distance (Feet): 100 Feet Assistive device: Rolling  walker Ambulation/Gait Assistance Details: Limited ambulation this session due to increased pain in buttocks radiating down his legs. Increased weight through RW this session Gait Pattern: Step-through pattern;Decreased stride length Gait velocity: decreased    Exercises     PT Diagnosis:    PT Problem List:   PT Treatment Interventions:     PT Goals (current goals can now be found in the care plan section)    Visit Information  Last PT Received On: 11/23/13 Assistance Needed: +1 History of Present Illness: s/p PLIF L5-S1    Subjective Data      Cognition  Cognition Arousal/Alertness: Awake/alert Behavior During Therapy: WFL for tasks assessed/performed Overall Cognitive Status: Within Functional Limits for tasks assessed    Balance     End of Session PT - End of Session Equipment Utilized During Treatment: Gait belt;Back brace Activity Tolerance: Patient limited by pain Patient left: in chair;with call bell/phone within reach;with family/visitor present Nurse Communication: Mobility status   GP     Fredrich Birks 11/23/2013, 9:27 AM 11/23/2013 Fredrich Birks PTA [phone] pager [phone] office

## 2013-11-23 NOTE — Progress Notes (Signed)
i have offered to ambulate patient twice so far today,both refused by patient due to pain. Will cont to monitor

## 2013-11-23 NOTE — Progress Notes (Addendum)
Patient asked me to call MD because "his pain is too bad, these meds aren't touching it; it's like taking an aspirin." Patient resting in bed as he is saying this. Po dilaudid, robaxin, valium and tylenol have all been given within the last few hours. Paged MD, awaiting response.   MD elsner returned page, said to give him an extra 2mg  po Dilaudid dose 1x now, then resume the 2mg  po Dilaudid q3 hours order

## 2013-11-24 LAB — GLUCOSE, CAPILLARY
Glucose-Capillary: 139 mg/dL — ABNORMAL HIGH (ref 70–99)
Glucose-Capillary: 193 mg/dL — ABNORMAL HIGH (ref 70–99)

## 2013-11-24 MED ORDER — HYDROMORPHONE HCL 2 MG PO TABS: 2.0000 mg | ORAL_TABLET | ORAL | Status: DC | PRN

## 2013-11-24 MED ORDER — HYDROMORPHONE HCL 2 MG PO TABS
2.0000 mg | ORAL_TABLET | ORAL | Status: DC | PRN
  Administered 2013-11-24: 2 mg via ORAL
  Administered 2013-11-24: 4 mg via ORAL
  Filled 2013-11-24 (×2): qty 2

## 2013-11-24 MED ORDER — HYDROMORPHONE HCL 4 MG PO TABS: 4.0000 mg | ORAL_TABLET | ORAL | Status: DC | PRN

## 2013-11-24 MED ORDER — METHOCARBAMOL 500 MG PO TABS: 500.0000 mg | ORAL_TABLET | Freq: Four times a day (QID) | ORAL | Status: DC | PRN

## 2013-11-24 MED ORDER — SPIRITUS FRUMENTI
1.0000 | Freq: Two times a day (BID) | ORAL | Status: DC
Start: 2013-11-24 — End: 2013-11-24
  Administered 2013-11-24: 1 via ORAL
  Filled 2013-11-24 (×3): qty 1

## 2013-11-24 NOTE — Progress Notes (Signed)
PT Cancellation Note  Patient Details Name: STEPFON RAWLES MRN: [id number] DOB: 13-Dec-1961   Cancelled Treatment:    Reason Eval/Treat Not Completed: Other (comment)   Pt taking very important phone call (son from jail)  Discussed ways for managing steps with wife  Discussed with RN -- she will page me if the pt has PT questions prior to leaving, and I'll be happy to return   Van Clines [facility] 11/24/2013, 2:47 PM

## 2013-11-24 NOTE — Discharge Summary (Signed)
Physician Discharge Summary  Patient ID: Thomas Donovan MRN: [id number] DOB/AGE: [birth year] 52 y.o.  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 He tolerated the surgery well. Postoperatively he had issues with pain control. These have been resolved.  Consults: None  Significant Diagnostic Studies: None  Treatments: surgery: Decompression L5-S1 via posterior lumbar interbody arthrodesis peek spacers local autograft and allograft pedicle screw fixation L5-S1  Discharge Exam: Blood pressure 134/74, pulse 108, temperature 98.4 F (36.9 C), temperature source Oral, resp. rate 20, height 6' (1.829 m), weight 125.011 kg (275 lb 9.6 oz), SpO2 99.00%. Incision is clean and dry motor function is intact.  Disposition: 01-Home or Self Care  Discharge Orders   Future Orders Complete By Expires   Call MD for:  redness, tenderness, or signs of infection (pain, swelling, redness, odor or green/yellow discharge around incision site)  As directed    Call MD for:  severe uncontrolled pain  As directed    Call MD for:  temperature >100.4  As directed    Diet - low sodium heart healthy  As directed    Increase activity slowly  As directed        Medication List         AMARYL 4 MG tablet  Generic drug:  glimepiride  Take 4 mg by mouth daily with breakfast.     colchicine 0.6 MG tablet  Take 0.6 mg by mouth daily as needed.     doxycycline 100 MG capsule  Commonly known as:  VIBRAMYCIN  Take 100 mg by mouth 2 (two) times daily.     fluticasone 50 MCG/ACT nasal spray  Commonly known as:  FLONASE  Place into both nostrils 1 day or 1 dose.     HYDROcodone-acetaminophen 10-325 MG per tablet  Commonly known as:  NORCO  Take 1 tablet by mouth every 6 (six) hours as needed.     HYDROmorphone 2 MG tablet  Commonly known as:  DILAUDID  Take 1-2 tablets (2-4 mg total) by mouth every 3 (three) hours as needed for severe pain.     HYDROmorphone 4 MG tablet  Commonly known as:  DILAUDID  Take 1 tablet (4 mg total) by mouth every 4 (four) hours as needed for severe pain.     insulin glargine 100 UNIT/ML injection  Commonly known as:  LANTUS  Inject 26-30 Units into the skin every morning. *Patient only takes if sugar levels are elevated*     lisinopril-hydrochlorothiazide 20-12.5 MG per tablet  Commonly known as:  PRINZIDE,ZESTORETIC  Take 1 tablet by mouth every morning.     methocarbamol 500 MG tablet  Commonly known as:  ROBAXIN  Take 1 tablet (500 mg total) by mouth every 6 (six) hours as needed for muscle spasms.     omeprazole 40 MG capsule  Commonly known as:  PRILOSEC  Take 40 mg by mouth 2 (two) times daily.         SignedStefani Dama 11/24/2013, 1:11 PM

## 2013-11-24 NOTE — Progress Notes (Signed)
Walker and 3-in-1 delivered to room. Rx's given, education given. IV DCd

## 2013-11-24 NOTE — Progress Notes (Signed)
Per night RN during report: there was an incident last night where she gave patient valium in a cup, he said he swallowed it but she noticed the valium still in the cup. She told the patient he in fact did not swallow the valium like he said he did, and he said "oops maybe i swallowed ice and thought it was the valium"  This was, per night RN, passed along to charge nurse.   All pills will be physically placed inside patient's mouth by RN this shift

## 2013-11-24 NOTE — Progress Notes (Addendum)
Noticed patient sweating and with fine tremors in his hands. Asked patient does he normally drink alcohol. He said he had not drunk anything in two weeks. I said that's fine but "please be honest, this is a judgement free zone, this is stuff I need to know to take proper care of you. How much do you normally drink a day and when is the last time you drunk anything." He said "ok, i can drink anywhere from 2 40s a day to a 12 pack." I said "per day?" and he said "yes." I asked when was the last time he drunk anything and he said "the day before surgery."  MD on call paged.   Orders recieved

## 2013-11-24 NOTE — Progress Notes (Signed)
   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        Status of service:  Completed, signed off Medicare Important Message given?   (If response is "NO", the following Medicare IM given date fields will be blank) Date Medicare IM given:   Date Additional Medicare IM given:    Discharge Disposition:  HOME/SELF CARE  Per UR Regulation:    If discussed at Long Length of Stay Meetings, dates discussed:    Comments:  11/24/13 13:15 CM called DME to have 3n1 and rolling walker delivered to room prior to discharge.  No other CM needs were communicated.  Freddy Jaksch, BSN, CM [phone].

## 2013-11-26 ENCOUNTER — Encounter (HOSPITAL_COMMUNITY): Payer: Self-pay | Admitting: Neurological Surgery

## 2015-01-01 ENCOUNTER — Emergency Department (HOSPITAL_COMMUNITY)
Admission: EM | Admit: 2015-01-01 | Discharge: 2015-01-01 | Disposition: A | Discharge status: Home / Self Care | Payer: Medicare Other | Attending: Emergency Medicine | Admitting: Emergency Medicine

## 2015-01-01 ENCOUNTER — Encounter (HOSPITAL_COMMUNITY): Payer: Self-pay | Admitting: Emergency Medicine

## 2015-01-01 DIAGNOSIS — Z87442 Personal history of urinary calculi: Secondary | ICD-10-CM | POA: Diagnosis not present

## 2015-01-01 DIAGNOSIS — G8929 Other chronic pain: Secondary | ICD-10-CM | POA: Diagnosis not present

## 2015-01-01 DIAGNOSIS — K219 Gastro-esophageal reflux disease without esophagitis: Secondary | ICD-10-CM | POA: Insufficient documentation

## 2015-01-01 DIAGNOSIS — I10 Essential (primary) hypertension: Secondary | ICD-10-CM | POA: Diagnosis not present

## 2015-01-01 DIAGNOSIS — Z8709 Personal history of other diseases of the respiratory system: Secondary | ICD-10-CM | POA: Diagnosis not present

## 2015-01-01 DIAGNOSIS — E119 Type 2 diabetes mellitus without complications: Secondary | ICD-10-CM | POA: Diagnosis not present

## 2015-01-01 DIAGNOSIS — Z794 Long term (current) use of insulin: Secondary | ICD-10-CM | POA: Diagnosis not present

## 2015-01-01 DIAGNOSIS — R109 Unspecified abdominal pain: Secondary | ICD-10-CM | POA: Diagnosis present

## 2015-01-01 DIAGNOSIS — M109 Gout, unspecified: Secondary | ICD-10-CM | POA: Diagnosis not present

## 2015-01-01 DIAGNOSIS — Z79899 Other long term (current) drug therapy: Secondary | ICD-10-CM | POA: Insufficient documentation

## 2015-01-01 DIAGNOSIS — R319 Hematuria, unspecified: Secondary | ICD-10-CM | POA: Insufficient documentation

## 2015-01-01 DIAGNOSIS — Z720 Tobacco use: Secondary | ICD-10-CM | POA: Insufficient documentation

## 2015-01-01 LAB — BASIC METABOLIC PANEL
Anion gap: 8 (ref 5–15)
BUN: 17 mg/dL (ref 6–23)
CALCIUM: 9 mg/dL (ref 8.4–10.5)
CO2: 24 mmol/L (ref 19–32)
CREATININE: 0.99 mg/dL (ref 0.50–1.35)
Chloride: 104 mEq/L (ref 96–112)
GFR calc Af Amer: >90 mL/min (ref >90)
GFR calc non Af Amer: >90 mL/min (ref >90)
GLUCOSE: 169 mg/dL — AB (ref 70–99)
Potassium: 3.5 mmol/L (ref 3.5–5.1)
Sodium: 136 mmol/L (ref 135–145)

## 2015-01-01 LAB — CBC WITH DIFFERENTIAL/PLATELET
Basophils Absolute: 0.1 10*3/uL (ref 0.0–0.1)
Basophils Relative: 1 % (ref 0–1)
EOS PCT: 4 % (ref 0–5)
Eosinophils Absolute: 0.3 10*3/uL (ref 0.0–0.7)
HCT: 41.8 % (ref 39.0–52.0)
HEMOGLOBIN: 13.9 g/dL (ref 13.0–17.0)
LYMPHS ABS: 2.2 10*3/uL (ref 0.7–4.0)
Lymphocytes Relative: 25 % (ref 12–46)
MCH: 31.4 pg (ref 26.0–34.0)
MCHC: 33.3 g/dL (ref 30.0–36.0)
MCV: 94.4 fL (ref 78.0–100.0)
MONOS PCT: 9 % (ref 3–12)
Monocytes Absolute: 0.8 10*3/uL (ref 0.1–1.0)
Neutro Abs: 5.5 10*3/uL (ref 1.7–7.7)
Neutrophils Relative %: 63 % (ref 43–77)
Platelets: 238 10*3/uL (ref 150–400)
RBC: 4.43 MIL/uL (ref 4.22–5.81)
RDW: 13.3 % (ref 11.5–15.5)
WBC: 8.8 10*3/uL (ref 4.0–10.5)

## 2015-01-01 LAB — URINALYSIS, ROUTINE W REFLEX MICROSCOPIC
BILIRUBIN URINE: NEGATIVE
Glucose, UA: NEGATIVE mg/dL
KETONES UR: NEGATIVE mg/dL
LEUKOCYTES UA: NEGATIVE
NITRITE: NEGATIVE
PROTEIN: NEGATIVE mg/dL
Specific Gravity, Urine: 1.02 (ref 1.005–1.030)
UROBILINOGEN UA: 0.2 mg/dL (ref 0.0–1.0)
pH: 6 (ref 5.0–8.0)

## 2015-01-01 LAB — URINE MICROSCOPIC-ADD ON

## 2015-01-01 MED ORDER — MORPHINE SULFATE 4 MG/ML IJ SOLN
6.0000 mg | Freq: Once | INTRAMUSCULAR | Status: AC
  Administered 2015-01-01: 6 mg via INTRAVENOUS
  Filled 2015-01-01: qty 2

## 2015-01-01 MED ORDER — ONDANSETRON 4 MG PO TBDP: 4.0000 mg | ORAL_TABLET | Freq: Three times a day (TID) | ORAL | Status: DC | PRN

## 2015-01-01 MED ORDER — KETOROLAC TROMETHAMINE 30 MG/ML IJ SOLN
30.0000 mg | Freq: Once | INTRAMUSCULAR | Status: AC
  Administered 2015-01-01: 30 mg via INTRAVENOUS
  Filled 2015-01-01: qty 1

## 2015-01-01 MED ORDER — MORPHINE SULFATE 4 MG/ML IJ SOLN: 4.0000 mg | Freq: Once | INTRAMUSCULAR | Status: DC

## 2015-01-01 MED ORDER — SODIUM CHLORIDE 0.9 % IV BOLUS (SEPSIS)
1000.0000 mL | Freq: Once | INTRAVENOUS | Status: AC
  Administered 2015-01-01: 1000 mL via INTRAVENOUS

## 2015-01-01 MED ORDER — IBUPROFEN 800 MG PO TABS: 800.0000 mg | ORAL_TABLET | Freq: Three times a day (TID) | ORAL | Status: DC | PRN

## 2015-01-01 MED ORDER — OXYCODONE-ACETAMINOPHEN 5-325 MG PO TABS: 1.0000 | ORAL_TABLET | ORAL | Status: DC | PRN

## 2015-01-01 MED ORDER — HYDROMORPHONE HCL 1 MG/ML IJ SOLN
1.0000 mg | Freq: Once | INTRAMUSCULAR | Status: AC
  Administered 2015-01-01: 1 mg via INTRAVENOUS
  Filled 2015-01-01: qty 1

## 2015-01-01 MED ORDER — ONDANSETRON HCL 4 MG/2ML IJ SOLN
4.0000 mg | Freq: Once | INTRAMUSCULAR | Status: AC
  Administered 2015-01-01: 4 mg via INTRAVENOUS
  Filled 2015-01-01: qty 2

## 2015-01-01 NOTE — ED Notes (Signed)
Pt c/ left flank pain/dysuria/hematuria since yesterday. Some nausea, denies vomiting.

## 2015-01-01 NOTE — Discharge Instructions (Signed)

## 2015-01-01 NOTE — ED Provider Notes (Signed)
This chart was scribed for Layla MawKristen N Colbe Viviano, DO by Tonye RoyaltyJoshua Chen, ED Scribe. This patient was seen in room APA10/APA10   TIME SEEN: 9:21 AM  CHIEF COMPLAINT: left flank pain  HPI: Thomas MccoyRonald R Donovan is a 54 y.o. male with history of kidney stones who presents to the Emergency Department complaining of left flank pain with onset yesterday. He reports associated hematuria. He states this feels like prior kidney stones. He also notes history of gout. He denies back injury. He has had to have ureteral stents in the past but states is a several years ago. Denies any back injury. Pain is worse with movement. He denies numbness, tingling, or incontinence. No dysuria or difficulty urinating. No vomiting or diarrhea but does have nausea. No fever.  ROS: See HPI Constitutional: no fever  Eyes: no drainage  ENT: no runny nose   Cardiovascular:  no chest pain  Resp: no SOB  GI: no vomiting GU: positive flank pain, hematuria Integumentary: no rash  Allergy: no hives  Musculoskeletal: no leg swelling  Neurological: no slurred speech, numbness, tingling,incontinence ROS otherwise negative  PAST MEDICAL HISTORY/PAST SURGICAL HISTORY:  Past Medical History  Diagnosis Date  . Degeneration of lumbar intervertebral disc   . Diabetes mellitus   . Gout   . Hypertension   . Chronic back pain   . Cold     recent rx  . History of kidney stones   . GERD (gastroesophageal reflux disease)     MEDICATIONS:  Prior to Admission medications   Medication Sig Start Date End Date Taking? Authorizing Provider  colchicine 0.6 MG tablet Take 0.6 mg by mouth daily as needed.    Historical Provider, MD  doxycycline (VIBRAMYCIN) 100 MG capsule Take 100 mg by mouth 2 (two) times daily.    Historical Provider, MD  fluticasone (FLONASE) 50 MCG/ACT nasal spray Place into both nostrils 1 day or 1 dose.    Historical Provider, MD  glimepiride (AMARYL) 4 MG tablet Take 4 mg by mouth daily with breakfast.    Historical Provider,  MD  HYDROcodone-acetaminophen (NORCO) 10-325 MG per tablet Take 1 tablet by mouth every 6 (six) hours as needed.    Historical Provider, MD  HYDROmorphone (DILAUDID) 2 MG tablet Take 1-2 tablets (2-4 mg total) by mouth every 3 (three) hours as needed for severe pain. 11/24/13   Barnett AbuHenry Elsner, MD  HYDROmorphone (DILAUDID) 4 MG tablet Take 1 tablet (4 mg total) by mouth every 4 (four) hours as needed for severe pain. 11/24/13   Barnett AbuHenry Elsner, MD  insulin glargine (LANTUS) 100 UNIT/ML injection Inject 26-30 Units into the skin every morning. *Patient only takes if sugar levels are elevated*    Historical Provider, MD  lisinopril-hydrochlorothiazide (PRINZIDE,ZESTORETIC) 20-12.5 MG per tablet Take 1 tablet by mouth every morning.     Historical Provider, MD  methocarbamol (ROBAXIN) 500 MG tablet Take 1 tablet (500 mg total) by mouth every 6 (six) hours as needed for muscle spasms. 11/24/13   Barnett AbuHenry Elsner, MD  omeprazole (PRILOSEC) 40 MG capsule Take 40 mg by mouth 2 (two) times daily.    Historical Provider, MD    ALLERGIES:  Allergies  Allergen Reactions  . Tramadol Hives         SOCIAL HISTORY:  History  Substance Use Topics  . Smoking status: Current Every Day Smoker -- 0.50 packs/day for 10 years    Types: Cigarettes  . Smokeless tobacco: Not on file  . Alcohol Use: 7.2 oz/week  12 Cans of beer per week    FAMILY HISTORY: No family history on file.  EXAM: BP 150/100 mmHg  Pulse 98  Temp(Src) 99.4 F (37.4 C)  Resp 20  Ht 6' (1.829 m)  Wt 269 lb (122.018 kg)  BMI 36.48 kg/m2  SpO2 99% CONSTITUTIONAL: Alert and oriented and responds appropriately to questions. Well-appearing; well-nourished HEAD: Normocephalic EYES: Conjunctivae clear, PERRL ENT: normal nose; no rhinorrhea; moist mucous membranes; pharynx without lesions noted NECK: Supple, no meningismus, no LAD  CARD: RRR; S1 and S2 appreciated; no murmurs, no clicks, no rubs, no gallops RESP: Normal chest excursion  without splinting or tachypnea; breath sounds clear and equal bilaterally; no wheezes, no rhonchi, no rales,  ABD/GI: Normal bowel sounds; non-distended; soft, non-tender, no rebound, no guarding BACK:  The back appears normal and is non-tender to palpation, there is no CVA tenderness, Tender to palpation over the left flank but no midline spinal tenderness EXT: Normal ROM in all joints; non-tender to palpation; no edema; normal capillary refill; no cyanosis    SKIN: Normal color for age and race; warm NEURO: Moves all extremities equally, normal sensation, 5/5 strength in lower extremities, 2+ deep tendon reflexes in bilateral upper and lower kidneys, no clonus, normal gait PSYCH: The patient's mood and manner are appropriate. Grooming and personal hygiene are appropriate.  MEDICAL DECISION MAKING: Patient here with left flank pain that he states is similar to his prior episodes of kidney stones. Will obtain labs, urinalysis. He reports he's had a ureteral stents in the past. He is otherwise well-appearing at this time I do not feel he needs a repeat CT scan of his abdomen and pelvis as he is a known history of kidney stones.  ED PROGRESS: Patient's labs are unremarkable. Normal creatinine. Urine shows large hemoglobin with no other sign of infection. Suspect kidney stone.  Patient states his pain is somewhat improved, and he appears more comfortable. We'll give Flomax, ibuprofen, Percocet, Zofran prescriptions for discharge. We'll give urology follow-up information. Discussed return precautions and supportive care instructions. He verbalizes understanding and is comfortable with plan.   I personally performed the services described in this documentation, which was scribed in my presence. The recorded information has been reviewed and is accurate.    Layla Maw Phelan Goers, DO 01/01/15 1515

## 2015-01-12 ENCOUNTER — Emergency Department (HOSPITAL_COMMUNITY)
Admission: EM | Admit: 2015-01-12 | Discharge: 2015-01-12 | Disposition: A | Discharge status: Home / Self Care | Payer: Medicare Other | Attending: Emergency Medicine | Admitting: Emergency Medicine

## 2015-01-12 ENCOUNTER — Encounter (HOSPITAL_COMMUNITY): Payer: Self-pay | Admitting: Emergency Medicine

## 2015-01-12 DIAGNOSIS — Z765 Malingerer [conscious simulation]: Secondary | ICD-10-CM | POA: Diagnosis not present

## 2015-01-12 DIAGNOSIS — Z87442 Personal history of urinary calculi: Secondary | ICD-10-CM | POA: Insufficient documentation

## 2015-01-12 DIAGNOSIS — G8929 Other chronic pain: Secondary | ICD-10-CM | POA: Diagnosis not present

## 2015-01-12 DIAGNOSIS — Z8709 Personal history of other diseases of the respiratory system: Secondary | ICD-10-CM | POA: Insufficient documentation

## 2015-01-12 DIAGNOSIS — Z7951 Long term (current) use of inhaled steroids: Secondary | ICD-10-CM | POA: Diagnosis not present

## 2015-01-12 DIAGNOSIS — E119 Type 2 diabetes mellitus without complications: Secondary | ICD-10-CM | POA: Diagnosis not present

## 2015-01-12 DIAGNOSIS — Z794 Long term (current) use of insulin: Secondary | ICD-10-CM | POA: Insufficient documentation

## 2015-01-12 DIAGNOSIS — Z79899 Other long term (current) drug therapy: Secondary | ICD-10-CM | POA: Diagnosis not present

## 2015-01-12 DIAGNOSIS — Z720 Tobacco use: Secondary | ICD-10-CM | POA: Diagnosis not present

## 2015-01-12 DIAGNOSIS — I10 Essential (primary) hypertension: Secondary | ICD-10-CM | POA: Insufficient documentation

## 2015-01-12 DIAGNOSIS — R319 Hematuria, unspecified: Secondary | ICD-10-CM | POA: Insufficient documentation

## 2015-01-12 DIAGNOSIS — K219 Gastro-esophageal reflux disease without esophagitis: Secondary | ICD-10-CM | POA: Insufficient documentation

## 2015-01-12 DIAGNOSIS — R109 Unspecified abdominal pain: Secondary | ICD-10-CM | POA: Diagnosis present

## 2015-01-12 DIAGNOSIS — M109 Gout, unspecified: Secondary | ICD-10-CM | POA: Diagnosis not present

## 2015-01-12 NOTE — ED Notes (Signed)
MD at bedside. 

## 2015-01-12 NOTE — ED Notes (Signed)
Pt left without d/c papers

## 2015-01-12 NOTE — Discharge Instructions (Signed)
Follow with your doctors for management of your pain. Return if you developed fevers.

## 2015-01-12 NOTE — ED Notes (Signed)
Flank pain on left side. Has passed 2 stones since last visit. Pain worse today, at times hard to void, has blood in urine.

## 2015-01-12 NOTE — ED Provider Notes (Signed)
CSN: [id number]     Arrival date & time 01/12/15  1429 History  This chart was scribed for [employer]. Rubin Payor, MD by Richarda Overlie, ED Scribe. This patient was seen in room APA05/APA05 and the patient's care was started 4:22 PM.    Chief Complaint  Patient presents with  . Flank Pain   HPI HPI Comments: Thomas Donovan is a 54 y.o. male with a history of DM, gout, HTN and kidney stones who presents to the Emergency Department complaining of worsening left sided flank pain for the last 2 days. Pt states he was here a week ago for kidney stones and has passed 2 stones since that visit, with the last one being yesterday. He says he was prescribed oxycodone medication for the pain which he says has provided him relief but has now ran out of. Pt reports associated hematuria. Pt states his pain worsens with certain movements and walking. Pt denies fever.    Past Medical History  Diagnosis Date  . Degeneration of lumbar intervertebral disc   . Diabetes mellitus   . Gout   . Hypertension   . Chronic back pain   . Cold     recent rx  . History of kidney stones   . GERD (gastroesophageal reflux disease)    Past Surgical History  Procedure Laterality Date  . Tonsillectomy    . Back surgery    . Maximum access (mas)posterior lumbar interbody fusion (plif) 1 level  11/20/2013    Procedure: FOR MAXIMUM ACCESS (MAS) POSTERIOR LUMBAR INTERBODY FUSION LUMBAR FIVE-SACRAL ONE;  Surgeon: Tia Alert, MD;  Location: [facility];  Service: Neurosurgery;;  FOR MAXIMUM ACCESS (MAS) POSTERIOR LUMBAR INTERBODY FUSION LUMBAR FIVE-SACRAL ONE   No family history on file. History  Substance Use Topics  . Smoking status: Current Every Day Smoker -- 0.50 packs/day for 10 years    Types: Cigarettes  . Smokeless tobacco: Not on file  . Alcohol Use: 7.2 oz/week    12 Cans of beer per week     Comment: occasionally    Review of Systems  Constitutional: Negative for fever.  Genitourinary: Positive for  hematuria and flank pain.  All other systems reviewed and are negative.     Allergies  Tramadol  Home Medications   Prior to Admission medications   Medication Sig Start Date End Date Taking? Authorizing Provider  allopurinol (ZYLOPRIM) 300 MG tablet Take 300 mg by mouth daily. 12/27/14   Historical Provider, MD  colchicine 0.6 MG tablet Take 0.6 mg by mouth daily as needed (gout flare).     Historical Provider, MD  fluticasone (FLONASE) 50 MCG/ACT nasal spray Place 2 sprays into both nostrils daily.     Historical Provider, MD  glimepiride (AMARYL) 4 MG tablet Take 8 mg by mouth daily with breakfast.     Historical Provider, MD  HYDROmorphone (DILAUDID) 2 MG tablet Take 1-2 tablets (2-4 mg total) by mouth every 3 (three) hours as needed for severe pain. Patient not taking: Reported on 01/01/2015 11/24/13   Barnett Abu, MD  HYDROmorphone (DILAUDID) 4 MG tablet Take 1 tablet (4 mg total) by mouth every 4 (four) hours as needed for severe pain. Patient not taking: Reported on 01/01/2015 11/24/13   Barnett Abu, MD  ibuprofen (ADVIL,MOTRIN) 800 MG tablet Take 1 tablet (800 mg total) by mouth every 8 (eight) hours as needed for mild pain. 01/01/15   Kristen N Ward, DO  ibuprofen (ADVIL,MOTRIN) 800 MG tablet Take 1  tablet (800 mg total) by mouth every 8 (eight) hours as needed for mild pain. 01/01/15   Kristen N Ward, DO  insulin NPH Human (HUMULIN N,NOVOLIN N) 100 UNIT/ML injection Inject 10-25 Units into the skin 2 (two) times daily. 25 units in the AM and 10 units in the PM.    Historical Provider, MD  lisinopril-hydrochlorothiazide (PRINZIDE,ZESTORETIC) 20-25 MG per tablet Take 1 tablet by mouth daily. 12/10/14   Historical Provider, MD  methocarbamol (ROBAXIN) 500 MG tablet Take 1 tablet (500 mg total) by mouth every 6 (six) hours as needed for muscle spasms. Patient not taking: Reported on 01/01/2015 11/24/13   Barnett AbuHenry Elsner, MD  omeprazole (PRILOSEC) 20 MG capsule Take 1 capsule by mouth 2 (two)  times daily. 12/06/14   Historical Provider, MD  ondansetron (ZOFRAN ODT) 4 MG disintegrating tablet Take 1 tablet (4 mg total) by mouth every 8 (eight) hours as needed for nausea or vomiting. 01/01/15   Kristen N Ward, DO  oxyCODONE-acetaminophen (PERCOCET/ROXICET) 5-325 MG per tablet Take 1 tablet by mouth every 4 (four) hours as needed. 01/01/15   Kristen N Ward, DO   BP 138/82 mmHg  Pulse 91  Temp(Src) 98.7 F (37.1 C) (Oral)  Resp 18  Ht 6' (1.829 m)  Wt 280 lb (127.007 kg)  BMI 37.97 kg/m2  SpO2 100% Physical Exam  Constitutional: He is oriented to person, place, and time. He appears well-developed and well-nourished.  HENT:  Head: Normocephalic and atraumatic.  Eyes: Right eye exhibits no discharge. Left eye exhibits no discharge.  Neck: Neck supple. No tracheal deviation present.  Cardiovascular: Normal rate.   Pulmonary/Chest: Effort normal. No respiratory distress.  Abdominal: He exhibits no distension. There is tenderness.  Mild left CVA tenderness  Neurological: He is alert and oriented to person, place, and time.  No leg raise.   Skin: Skin is warm and dry. No rash noted.  Psychiatric: He has a normal mood and affect. His behavior is normal.  Nursing note and vitals reviewed.   ED Course  Procedures   DIAGNOSTIC STUDIES: Oxygen Saturation is 100% on RA, normal by my interpretation.    COORDINATION OF CARE: 4:25 PM Discussed treatment plan with pt at bedside and pt agreed to plan.   Labs Review Labs Reviewed - No data to display  Imaging Review No results found.   EKG Interpretation None      MDM   Final diagnoses:  Drug-seeking behavior   Patient presents with flank pain. Recently seen in the ED here for the same. Under questioning patient states that he has had pills from Dr. Elesa MassedWard on the seventh that has lasted him up until today. He states that he had no other visits here. Review of the drug database shows that he has had 2 visits to other hospitals  since then. He states he had to go to an urgent care. He also stated that he is on no pain medicines, however the last month he has had 5 prescriptions for narcotics. He was previously on 4 mg of Dilaudid 3 times a day that appears to just finished up. His last prescription was on December 11. He should've been due for refill by today, the 18th. When questioned about all the prescriptions he has been getting he stated that was on some pain medicines for his kidney stones. These prescriptions for the Dilaudid were coming from neurosurgery. Patient stated he said he was on no pain medicines because he does not go to a pain  clinic. Patient has had a negative CT scan here for kidney stones in the past. Patient was not upfront with his opiate use and lied to me about it. He will not receive further narcotics in the ER and was discharged.  I personally performed the services described in this documentation, which was scribed in my presence. The recorded information has been reviewed and is accurate.    Juliet Rude. Rubin Payor, MD 01/12/15 1651

## 2015-06-02 ENCOUNTER — Encounter (HOSPITAL_COMMUNITY): Payer: Self-pay | Admitting: Emergency Medicine

## 2015-06-02 ENCOUNTER — Emergency Department (HOSPITAL_COMMUNITY)
Admission: EM | Admit: 2015-06-02 | Discharge: 2015-06-02 | Disposition: A | Discharge status: Home / Self Care | Payer: Medicare Other | Attending: Emergency Medicine | Admitting: Emergency Medicine

## 2015-06-02 ENCOUNTER — Emergency Department (HOSPITAL_COMMUNITY): Payer: Medicare Other

## 2015-06-02 DIAGNOSIS — E119 Type 2 diabetes mellitus without complications: Secondary | ICD-10-CM | POA: Insufficient documentation

## 2015-06-02 DIAGNOSIS — K219 Gastro-esophageal reflux disease without esophagitis: Secondary | ICD-10-CM | POA: Diagnosis not present

## 2015-06-02 DIAGNOSIS — J159 Unspecified bacterial pneumonia: Secondary | ICD-10-CM | POA: Insufficient documentation

## 2015-06-02 DIAGNOSIS — Z720 Tobacco use: Secondary | ICD-10-CM | POA: Insufficient documentation

## 2015-06-02 DIAGNOSIS — Z87442 Personal history of urinary calculi: Secondary | ICD-10-CM | POA: Diagnosis not present

## 2015-06-02 DIAGNOSIS — J189 Pneumonia, unspecified organism: Secondary | ICD-10-CM

## 2015-06-02 DIAGNOSIS — Z79899 Other long term (current) drug therapy: Secondary | ICD-10-CM | POA: Insufficient documentation

## 2015-06-02 DIAGNOSIS — M109 Gout, unspecified: Secondary | ICD-10-CM | POA: Insufficient documentation

## 2015-06-02 DIAGNOSIS — Z7951 Long term (current) use of inhaled steroids: Secondary | ICD-10-CM | POA: Diagnosis not present

## 2015-06-02 DIAGNOSIS — R000 Tachycardia, unspecified: Secondary | ICD-10-CM | POA: Insufficient documentation

## 2015-06-02 DIAGNOSIS — I10 Essential (primary) hypertension: Secondary | ICD-10-CM | POA: Insufficient documentation

## 2015-06-02 DIAGNOSIS — G8929 Other chronic pain: Secondary | ICD-10-CM | POA: Diagnosis not present

## 2015-06-02 DIAGNOSIS — R0602 Shortness of breath: Secondary | ICD-10-CM | POA: Diagnosis present

## 2015-06-02 LAB — COMPREHENSIVE METABOLIC PANEL
ALBUMIN: 3.6 g/dL (ref 3.5–5.0)
ALK PHOS: 85 U/L (ref 38–126)
ALT: 62 U/L (ref 17–63)
AST: 50 U/L — ABNORMAL HIGH (ref 15–41)
Anion gap: 14 (ref 5–15)
BUN: 16 mg/dL (ref 6–20)
CO2: 25 mmol/L (ref 22–32)
CREATININE: 1.01 mg/dL (ref 0.61–1.24)
Calcium: 8.8 mg/dL — ABNORMAL LOW (ref 8.9–10.3)
Chloride: 95 mmol/L — ABNORMAL LOW (ref 101–111)
GFR calc Af Amer: >60 mL/min (ref >60)
Glucose, Bld: 346 mg/dL — ABNORMAL HIGH (ref 65–99)
POTASSIUM: 3.8 mmol/L (ref 3.5–5.1)
SODIUM: 134 mmol/L — AB (ref 135–145)
Total Bilirubin: 1.1 mg/dL (ref 0.3–1.2)
Total Protein: 6.6 g/dL (ref 6.5–8.1)

## 2015-06-02 LAB — CBC WITH DIFFERENTIAL/PLATELET
BASOS PCT: 0 % (ref 0–1)
Basophils Absolute: 0 10*3/uL (ref 0.0–0.1)
Eosinophils Absolute: 0.3 10*3/uL (ref 0.0–0.7)
Eosinophils Relative: 3 % (ref 0–5)
HEMATOCRIT: 37.3 % — AB (ref 39.0–52.0)
Hemoglobin: 12.7 g/dL — ABNORMAL LOW (ref 13.0–17.0)
LYMPHS ABS: 1.8 10*3/uL (ref 0.7–4.0)
Lymphocytes Relative: 17 % (ref 12–46)
MCH: 33.1 pg (ref 26.0–34.0)
MCHC: 34 g/dL (ref 30.0–36.0)
MCV: 97.1 fL (ref 78.0–100.0)
Monocytes Absolute: 0.8 10*3/uL (ref 0.1–1.0)
Monocytes Relative: 8 % (ref 3–12)
NEUTROS ABS: 7.9 10*3/uL — AB (ref 1.7–7.7)
NEUTROS PCT: 72 % (ref 43–77)
Platelets: 201 10*3/uL (ref 150–400)
RBC: 3.84 MIL/uL — AB (ref 4.22–5.81)
RDW: 13.3 % (ref 11.5–15.5)
WBC: 10.9 10*3/uL — ABNORMAL HIGH (ref 4.0–10.5)

## 2015-06-02 LAB — BRAIN NATRIURETIC PEPTIDE: B NATRIURETIC PEPTIDE 5: 47 pg/mL (ref 0.0–100.0)

## 2015-06-02 LAB — TROPONIN I

## 2015-06-02 MED ORDER — AZITHROMYCIN 250 MG PO TABS: 250.0000 mg | ORAL_TABLET | Freq: Every day | ORAL | Status: DC

## 2015-06-02 MED ORDER — ALBUTEROL SULFATE HFA 108 (90 BASE) MCG/ACT IN AERS: 2.0000 | INHALATION_SPRAY | RESPIRATORY_TRACT | Status: DC | PRN

## 2015-06-02 MED ORDER — OXYCODONE-ACETAMINOPHEN 5-325 MG PO TABS
1.0000 | ORAL_TABLET | Freq: Once | ORAL | Status: AC
  Administered 2015-06-02: 1 via ORAL
  Filled 2015-06-02: qty 1

## 2015-06-02 MED ORDER — DICLOFENAC POTASSIUM 50 MG PO TABS: 50.0000 mg | ORAL_TABLET | Freq: Three times a day (TID) | ORAL | Status: DC

## 2015-06-02 NOTE — ED Provider Notes (Signed)
CSN: [id number]     Arrival date & time 06/02/15  1041 History  This chart was scribed for Thomas Crease, MD by Thomas Donovan, ED scribe. This patient was seen in room APA04/APA04 and the patient's care was started at 10:45 AM.    No chief complaint on file.   The history is provided by the patient. No language interpreter was used.    HPI Comments: Thomas Donovan is a 54 y.o. male, with a history of chronic back pain and HTN, who presents to the Emergency Department complaining of SOB with onset a few days ago. Pt notes associated generalized lumbar pack pain, cough, and sore throat. He also notes a white mucus when coughing and mentioned that his back pain is more severe than normal. Pt confirms a history of IDDM. He also notes going to a pain clinic for his chronic back pain. Pt denies and history of lung problems.   Past Medical History  Diagnosis Date  . Degeneration of lumbar intervertebral disc   . Diabetes mellitus   . Gout   . Hypertension   . Chronic back pain   . Cold     recent rx  . History of kidney stones   . GERD (gastroesophageal reflux disease)    Past Surgical History  Procedure Laterality Date  . Tonsillectomy    . Back surgery    . Maximum access (mas)posterior lumbar interbody fusion (plif) 1 level  11/20/2013    Procedure: FOR MAXIMUM ACCESS (MAS) POSTERIOR LUMBAR INTERBODY FUSION LUMBAR FIVE-SACRAL ONE;  Surgeon: Tia Alert, MD;  Location: [facility];  Service: Neurosurgery;;  FOR MAXIMUM ACCESS (MAS) POSTERIOR LUMBAR INTERBODY FUSION LUMBAR FIVE-SACRAL ONE   No family history on file. History  Substance Use Topics  . Smoking status: Current Every Day Smoker -- 0.50 packs/day for 10 years    Types: Cigarettes  . Smokeless tobacco: Not on file  . Alcohol Use: 7.2 oz/week    12 Cans of beer per week     Comment: occasionally    Review of Systems  HENT: Positive for sore throat.   Respiratory: Positive for cough and shortness of breath.    Musculoskeletal: Positive for back pain.  All other systems reviewed and are negative.     Allergies  Tramadol  Home Medications   Prior to Admission medications   Medication Sig Start Date End Date Taking? Authorizing Provider  allopurinol (ZYLOPRIM) 300 MG tablet Take 300 mg by mouth daily. 12/27/14   Historical Provider, MD  colchicine 0.6 MG tablet Take 0.6 mg by mouth daily as needed (gout flare).     Historical Provider, MD  fluticasone (FLONASE) 50 MCG/ACT nasal spray Place 2 sprays into both nostrils daily.     Historical Provider, MD  glimepiride (AMARYL) 4 MG tablet Take 8 mg by mouth daily with breakfast.     Historical Provider, MD  HYDROmorphone (DILAUDID) 2 MG tablet Take 1-2 tablets (2-4 mg total) by mouth every 3 (three) hours as needed for severe pain. Patient not taking: Reported on 01/01/2015 11/24/13   Thomas Abu, MD  HYDROmorphone (DILAUDID) 4 MG tablet Take 1 tablet (4 mg total) by mouth every 4 (four) hours as needed for severe pain. Patient not taking: Reported on 01/01/2015 11/24/13   Thomas Abu, MD  ibuprofen (ADVIL,MOTRIN) 800 MG tablet Take 1 tablet (800 mg total) by mouth every 8 (eight) hours as needed for mild pain. 01/01/15   Thomas N Ward, DO  ibuprofen (  ADVIL,MOTRIN) 800 MG tablet Take 1 tablet (800 mg total) by mouth every 8 (eight) hours as needed for mild pain. 01/01/15   Thomas N Ward, DO  insulin NPH Human (HUMULIN N,NOVOLIN N) 100 UNIT/ML injection Inject 10-25 Units into the skin 2 (two) times daily. 25 units in the AM and 10 units in the PM.    Historical Provider, MD  lisinopril-hydrochlorothiazide (PRINZIDE,ZESTORETIC) 20-25 MG per tablet Take 1 tablet by mouth daily. 12/10/14   Historical Provider, MD  methocarbamol (ROBAXIN) 500 MG tablet Take 1 tablet (500 mg total) by mouth every 6 (six) hours as needed for muscle spasms. Patient not taking: Reported on 01/01/2015 11/24/13   Thomas AbuHenry Elsner, MD  omeprazole (PRILOSEC) 20 MG capsule Take 1 capsule by  mouth 2 (two) times daily. 12/06/14   Historical Provider, MD  ondansetron (ZOFRAN ODT) 4 MG disintegrating tablet Take 1 tablet (4 mg total) by mouth every 8 (eight) hours as needed for nausea or vomiting. 01/01/15   Thomas N Ward, DO  oxyCODONE-acetaminophen (PERCOCET/ROXICET) 5-325 MG per tablet Take 1 tablet by mouth every 4 (four) hours as needed. 01/01/15   Thomas N Ward, DO   There were no vitals taken for this visit. Physical Exam  Constitutional: He is oriented to person, place, and time. He appears well-developed and well-nourished. No distress.  HENT:  Head: Normocephalic and atraumatic.  Right Ear: Hearing normal.  Left Ear: Hearing normal.  Nose: Nose normal.  Mouth/Throat: Oropharynx is clear and moist and mucous membranes are normal.  Eyes: Conjunctivae and EOM are normal. Pupils are equal, round, and reactive to light.  Neck: Normal range of motion. Neck supple.  Cardiovascular: S1 normal and S2 normal.  Tachycardia present.  Exam reveals no gallop and no friction rub.   No murmur heard. Slight tachycardia    Pulmonary/Chest: Effort normal and breath sounds normal. No respiratory distress. He exhibits no tenderness.  Lungs diminished bilaterally  Abdominal: Soft. Normal appearance and bowel sounds are normal. There is no hepatosplenomegaly. There is no tenderness. There is no rebound, no guarding, no tenderness at McBurney's point and negative Murphy's sign. No hernia.  Musculoskeletal: Normal range of motion.  Neurological: He is alert and oriented to person, place, and time. He has normal strength. No cranial nerve deficit or sensory deficit. Coordination normal. GCS eye subscore is 4. GCS verbal subscore is 5. GCS motor subscore is 6.  Skin: Skin is warm, dry and intact. No rash noted. No cyanosis.  Psychiatric: He has a normal mood and affect. His speech is normal and behavior is normal. Thought content normal.  Nursing note and vitals reviewed.   ED Course   Procedures  DIAGNOSTIC STUDIES: Oxygen Saturation is 96% on RA, normal by my interpretation.    COORDINATION OF CARE: 10:48 AM Discussed treatment plan with pt at bedside and pt agreed to plan.  Labs Review Labs Reviewed - No data to display  Imaging Review No results found.   EKG Interpretation None      MDM   Final diagnoses:  None  CAP  As to the ER for evaluation of cough, chest congestion with bilateral posterior rib discomfort. Patient is not hypoxic. Examination revealed diminished breath sounds bilaterally, but he is nontoxic in respiratory distress. Cardiac evaluation negative. Chest x-ray suspicious for early infiltrate left base. This would explain the patient's symptoms. Patient will be treated as community acquired pneumonia.  I personally performed the services described in this documentation, which was scribed in my presence.  The recorded information has been reviewed and is accurate.     Thomas Crease, MD 06/02/15 1154

## 2015-06-02 NOTE — ED Notes (Signed)
Patient continues to ask for pain medication. Patient informed that MD is aware of his requests and will address his concerns shortly.

## 2015-06-02 NOTE — ED Notes (Signed)
PT c/o SOB with tightness to chest and productive white/green thick sputum x3 days. PT also c/o lower back pain when coughing. PT denies any hx of lung problems.

## 2015-06-02 NOTE — ED Notes (Signed)
Requesting pain medication. MD aware. Awaiting orders. 

## 2015-06-02 NOTE — Discharge Instructions (Signed)

## 2015-06-21 ENCOUNTER — Emergency Department (HOSPITAL_COMMUNITY)
Admission: EM | Admit: 2015-06-21 | Discharge: 2015-06-21 | Disposition: A | Discharge status: Home / Self Care | Payer: Medicare Other | Attending: Emergency Medicine | Admitting: Emergency Medicine

## 2015-06-21 ENCOUNTER — Emergency Department (HOSPITAL_COMMUNITY): Payer: Medicare Other

## 2015-06-21 ENCOUNTER — Encounter (HOSPITAL_COMMUNITY): Payer: Self-pay | Admitting: Emergency Medicine

## 2015-06-21 DIAGNOSIS — I10 Essential (primary) hypertension: Secondary | ICD-10-CM | POA: Insufficient documentation

## 2015-06-21 DIAGNOSIS — R079 Chest pain, unspecified: Secondary | ICD-10-CM | POA: Insufficient documentation

## 2015-06-21 DIAGNOSIS — Z79899 Other long term (current) drug therapy: Secondary | ICD-10-CM | POA: Insufficient documentation

## 2015-06-21 DIAGNOSIS — R05 Cough: Secondary | ICD-10-CM | POA: Diagnosis not present

## 2015-06-21 DIAGNOSIS — Z8701 Personal history of pneumonia (recurrent): Secondary | ICD-10-CM | POA: Insufficient documentation

## 2015-06-21 DIAGNOSIS — Z8739 Personal history of other diseases of the musculoskeletal system and connective tissue: Secondary | ICD-10-CM | POA: Diagnosis not present

## 2015-06-21 DIAGNOSIS — R059 Cough, unspecified: Secondary | ICD-10-CM

## 2015-06-21 DIAGNOSIS — Z720 Tobacco use: Secondary | ICD-10-CM | POA: Diagnosis not present

## 2015-06-21 DIAGNOSIS — E119 Type 2 diabetes mellitus without complications: Secondary | ICD-10-CM | POA: Diagnosis not present

## 2015-06-21 DIAGNOSIS — K219 Gastro-esophageal reflux disease without esophagitis: Secondary | ICD-10-CM | POA: Diagnosis not present

## 2015-06-21 DIAGNOSIS — G8929 Other chronic pain: Secondary | ICD-10-CM | POA: Insufficient documentation

## 2015-06-21 DIAGNOSIS — Z791 Long term (current) use of non-steroidal anti-inflammatories (NSAID): Secondary | ICD-10-CM | POA: Diagnosis not present

## 2015-06-21 DIAGNOSIS — Z794 Long term (current) use of insulin: Secondary | ICD-10-CM | POA: Insufficient documentation

## 2015-06-21 DIAGNOSIS — Z87442 Personal history of urinary calculi: Secondary | ICD-10-CM | POA: Diagnosis not present

## 2015-06-21 HISTORY — DX: Pneumonia, unspecified organism: J18.9

## 2015-06-21 MED ORDER — PROMETHAZINE-DM 6.25-15 MG/5ML PO SYRP: 5.0000 mL | ORAL_SOLUTION | Freq: Four times a day (QID) | ORAL | Status: DC | PRN

## 2015-06-21 MED ORDER — BENZONATATE 100 MG PO CAPS: 100.0000 mg | ORAL_CAPSULE | Freq: Three times a day (TID) | ORAL | Status: DC

## 2015-06-21 MED ORDER — HYDROCODONE-ACETAMINOPHEN 5-325 MG PO TABS: 2.0000 | ORAL_TABLET | Freq: Once | ORAL | Status: DC

## 2015-06-21 MED ORDER — HYDROCODONE-ACETAMINOPHEN 5-325 MG PO TABS
1.0000 | ORAL_TABLET | Freq: Once | ORAL | Status: AC
Start: 2015-06-21 — End: 2015-06-21
  Administered 2015-06-21: 1 via ORAL
  Filled 2015-06-21: qty 1

## 2015-06-21 NOTE — Discharge Instructions (Signed)
No sign of worsening or continuation of your previously diagnosed pneumonia. Medications as as prescribed for your cough.  Cough, Adult  A cough is a reflex. It helps you clear your throat and airways. A cough can help heal your body. A cough can last 2 or 3 weeks (acute) or may last more than 8 weeks (chronic). Some common causes of a cough can include an infection, allergy, or a cold. HOME CARE  Only take medicine as told by your doctor.  If given, take your medicines (antibiotics) as told. Finish them even if you start to feel better.  Use a cold steam vaporizer or humidifier in your home. This can help loosen thick spit (secretions).  Sleep so you are almost sitting up (semi-upright). Use pillows to do this. This helps reduce coughing.  Rest as needed.  Stop smoking if you smoke. GET HELP RIGHT AWAY IF:  You have yellowish-white fluid (pus) in your thick spit.  Your cough gets worse.  Your medicine does not reduce coughing, and you are losing sleep.  You cough up blood.  You have trouble breathing.  Your pain gets worse and medicine does not help.  You have a fever. MAKE SURE YOU:   Understand these instructions.  Will watch your condition.  Will get help right away if you are not doing well or get worse. Document Released: 08/25/2011 Document Revised: 04/28/2014 Document Reviewed: 08/25/2011 [facility] Patient Information 2015 [place], [place]. This information is not intended to replace advice given to you by your health care provider. Make sure you discuss any questions you have with your health care provider.

## 2015-06-21 NOTE — ED Notes (Signed)
Patient seen in ER on 6/7 and diagnosed with pneumonia. Patient given z-pack, diclofenac, and albuterol inhaler. Per patient finished antibiotics with no improvement. Per patient has used all of the inhaler. Denies any fevers. Patient c/o pain in lower ribs with cramping and states "My throat is so raw from coughing so hard." Patient reports sputum being thick white to yellow.

## 2015-06-21 NOTE — ED Notes (Signed)
Patient has repeatedly asked for pain medication. Dr. Fayrene Fearing just in and spoke with patient, explained he would do patients discharge. Patient now calling out upset over not receiving more pain medication and threatening to leave.

## 2015-06-21 NOTE — ED Provider Notes (Signed)
CSN: [id number]     Arrival date & time 06/21/15  1735 History   First MD Initiated Contact with Patient 06/21/15 1809     Chief Complaint  Patient presents with  . Pneumonia      HPI  Patient presents for evaluation of a cough. Seen on 23 diagnosed with pneumonia. Cells he noted on x-ray. Discharge with Zithromax, anti-inflammatory, and albuterol inhaler. States his throat is still raw and his ribs are still sore from coughing. States it is thick white sputum.  Past Medical History  Diagnosis Date  . Degeneration of lumbar intervertebral disc   . Diabetes mellitus   . Gout   . Hypertension   . Chronic back pain   . Cold     recent rx  . History of kidney stones   . GERD (gastroesophageal reflux disease)   . Pneumonia    Past Surgical History  Procedure Laterality Date  . Tonsillectomy    . Back surgery    . Maximum access (mas)posterior lumbar interbody fusion (plif) 1 level  11/20/2013    Procedure: FOR MAXIMUM ACCESS (MAS) POSTERIOR LUMBAR INTERBODY FUSION LUMBAR FIVE-SACRAL ONE;  Surgeon: Tia Alert, MD;  Location: [facility];  Service: Neurosurgery;;  FOR MAXIMUM ACCESS (MAS) POSTERIOR LUMBAR INTERBODY FUSION LUMBAR FIVE-SACRAL ONE   History reviewed. No pertinent family history. History  Substance Use Topics  . Smoking status: Current Every Day Smoker -- 0.50 packs/day for 10 years    Types: Cigarettes  . Smokeless tobacco: Never Used  . Alcohol Use: 7.2 oz/week    12 Cans of beer per week    Review of Systems  Constitutional: Negative for fever, chills, diaphoresis, appetite change and fatigue.  HENT: Negative for mouth sores, sore throat and trouble swallowing.   Eyes: Negative for visual disturbance.  Respiratory: Positive for cough. Negative for chest tightness, shortness of breath and wheezing.   Cardiovascular: Positive for chest pain.  Gastrointestinal: Negative for nausea, vomiting, abdominal pain, diarrhea and abdominal distention.  Endocrine:  Negative for polydipsia, polyphagia and polyuria.  Genitourinary: Negative for dysuria, frequency and hematuria.  Musculoskeletal: Negative for gait problem.  Skin: Negative for color change, pallor and rash.  Neurological: Negative for dizziness, syncope, light-headedness and headaches.  Hematological: Does not bruise/bleed easily.  Psychiatric/Behavioral: Negative for behavioral problems and confusion.      Allergies  Ibuprofen and Tramadol  Home Medications   Prior to Admission medications   Medication Sig Start Date End Date Taking? Authorizing Provider  albuterol (PROVENTIL HFA;VENTOLIN HFA) 108 (90 BASE) MCG/ACT inhaler Inhale 2 puffs into the lungs every 4 (four) hours as needed for wheezing or shortness of breath. 06/02/15  Yes [name], MD  colchicine 0.6 MG tablet Take 0.6 mg by mouth daily as needed (gout flare).    Yes Historical Provider, MD  glimepiride (AMARYL) 4 MG tablet Take 8 mg by mouth daily with breakfast.    Yes Historical Provider, MD  insulin NPH Human (HUMULIN N,NOVOLIN N) 100 UNIT/ML injection Inject 10-25 Units into the skin 2 (two) times daily. 25 units in the AM and 10 units in the PM.   Yes Historical Provider, MD  lisinopril-hydrochlorothiazide (PRINZIDE,ZESTORETIC) 20-25 MG per tablet Take 1.5 tablets by mouth daily.  12/10/14  Yes Historical Provider, MD  omeprazole (PRILOSEC) 20 MG capsule Take 1 capsule by mouth 2 (two) times daily. 12/06/14  Yes Historical Provider, MD  oxyCODONE-acetaminophen (PERCOCET) 10-325 MG per tablet Take 1 tablet by mouth every 6 (  six) hours as needed for pain.  05/14/15  Yes Historical Provider, MD  azithromycin (ZITHROMAX) 250 MG tablet Take 1 tablet (250 mg total) by mouth daily. Take first 2 tablets together, then 1 every day until finished. 06/02/15   [name], MD  diclofenac (CATAFLAM) 50 MG tablet Take 1 tablet (50 mg total) by mouth 3 (three) times daily. 06/02/15   [name], MD   HYDROmorphone (DILAUDID) 2 MG tablet Take 1-2 tablets (2-4 mg total) by mouth every 3 (three) hours as needed for severe pain. Patient not taking: Reported on 01/01/2015 11/24/13   Barnett Abu, MD  HYDROmorphone (DILAUDID) 4 MG tablet Take 1 tablet (4 mg total) by mouth every 4 (four) hours as needed for severe pain. Patient not taking: Reported on 01/01/2015 11/24/13   Barnett Abu, MD  ibuprofen (ADVIL,MOTRIN) 800 MG tablet Take 1 tablet (800 mg total) by mouth every 8 (eight) hours as needed for mild pain. Patient not taking: Reported on 06/02/2015 01/01/15   Kristen N Ward, DO  ibuprofen (ADVIL,MOTRIN) 800 MG tablet Take 1 tablet (800 mg total) by mouth every 8 (eight) hours as needed for mild pain. Patient not taking: Reported on 06/02/2015 01/01/15   Layla Maw Ward, DO  methocarbamol (ROBAXIN) 500 MG tablet Take 1 tablet (500 mg total) by mouth every 6 (six) hours as needed for muscle spasms. Patient not taking: Reported on 01/01/2015 11/24/13   Barnett Abu, MD  ondansetron (ZOFRAN ODT) 4 MG disintegrating tablet Take 1 tablet (4 mg total) by mouth every 8 (eight) hours as needed for nausea or vomiting. Patient not taking: Reported on 06/02/2015 01/01/15   Layla Maw Ward, DO  oxyCODONE-acetaminophen (PERCOCET/ROXICET) 5-325 MG per tablet Take 1 tablet by mouth every 4 (four) hours as needed. Patient not taking: Reported on 06/02/2015 01/01/15   Kristen N Ward, DO   BP 130/78 mmHg  Pulse 90  Temp(Src) 98.2 F (36.8 C) (Oral)  Resp 24  Ht 6' (1.829 m)  Wt 265 lb (120.203 kg)  BMI 35.93 kg/m2  SpO2 97% Physical Exam  Constitutional: He is oriented to person, place, and time. He appears well-developed and well-nourished. No distress.  HENT:  Head: Normocephalic.  Eyes: Conjunctivae are normal. Pupils are equal, round, and reactive to light. No scleral icterus.  Neck: Normal range of motion. Neck supple. No thyromegaly present.  Cardiovascular: Normal rate and regular rhythm.  Exam reveals no gallop and  no friction rub.   No murmur heard. Pulmonary/Chest: Effort normal and breath sounds normal. No respiratory distress. He has no wheezes. He has no rales.  Abdominal: Soft. Bowel sounds are normal. He exhibits no distension. There is no tenderness. There is no rebound.  Musculoskeletal: Normal range of motion.  Neurological: He is alert and oriented to person, place, and time.  Skin: Skin is warm and dry. No rash noted.  Psychiatric: He has a normal mood and affect. His behavior is normal.    ED Course  Procedures (including critical care time) Labs Review Labs Reviewed - No data to display  Imaging Review Dg Chest 2 View  06/21/2015   CLINICAL DATA:  Persistent productive cough.  EXAM: CHEST  2 VIEW  COMPARISON:  Chest x-rays dated 06/02/2015 and 11/18/2013  FINDINGS: The heart size and mediastinal contours are within normal limits. Both lungs are clear. Osteophytes fuse much of the thoracic spine. No effusions.  IMPRESSION: No acute abnormalities.   Electronically Signed   By: Francene Boyers M.D.  On: 06/21/2015 19:12     EKG Interpretation None      MDM   Final diagnoses:  Cough    Chest x-ray shows resolution of the previous subtle infiltrate. Plan will be home. Cough medication. Expectant management.    Rolland Porter, MD 06/21/15 2011

## 2015-06-24 ENCOUNTER — Encounter (HOSPITAL_COMMUNITY): Payer: Self-pay | Admitting: Emergency Medicine

## 2015-06-24 ENCOUNTER — Emergency Department (HOSPITAL_COMMUNITY): Payer: Medicare Other

## 2015-06-24 ENCOUNTER — Emergency Department (HOSPITAL_COMMUNITY)
Admission: EM | Admit: 2015-06-24 | Discharge: 2015-06-24 | Discharge status: Against Medical Advice | Payer: Medicare Other | Attending: Emergency Medicine | Admitting: Emergency Medicine

## 2015-06-24 DIAGNOSIS — Z720 Tobacco use: Secondary | ICD-10-CM | POA: Insufficient documentation

## 2015-06-24 DIAGNOSIS — I10 Essential (primary) hypertension: Secondary | ICD-10-CM | POA: Diagnosis not present

## 2015-06-24 DIAGNOSIS — E119 Type 2 diabetes mellitus without complications: Secondary | ICD-10-CM | POA: Diagnosis not present

## 2015-06-24 DIAGNOSIS — M109 Gout, unspecified: Secondary | ICD-10-CM | POA: Diagnosis not present

## 2015-06-24 DIAGNOSIS — R079 Chest pain, unspecified: Secondary | ICD-10-CM | POA: Diagnosis present

## 2015-06-24 DIAGNOSIS — G8929 Other chronic pain: Secondary | ICD-10-CM | POA: Insufficient documentation

## 2015-06-24 LAB — CBC
HCT: 40.6 % (ref 39.0–52.0)
Hemoglobin: 14 g/dL (ref 13.0–17.0)
MCH: 32.6 pg (ref 26.0–34.0)
MCHC: 34.5 g/dL (ref 30.0–36.0)
MCV: 94.6 fL (ref 78.0–100.0)
Platelets: 238 10*3/uL (ref 150–400)
RBC: 4.29 MIL/uL (ref 4.22–5.81)
RDW: 12.9 % (ref 11.5–15.5)
WBC: 9.1 10*3/uL (ref 4.0–10.5)

## 2015-06-24 LAB — BASIC METABOLIC PANEL
Anion gap: 11 (ref 5–15)
BUN: 15 mg/dL (ref 6–20)
CO2: 26 mmol/L (ref 22–32)
Calcium: 8.8 mg/dL — ABNORMAL LOW (ref 8.9–10.3)
Chloride: 95 mmol/L — ABNORMAL LOW (ref 101–111)
Creatinine, Ser: 1.14 mg/dL (ref 0.61–1.24)
GFR calc Af Amer: >60 mL/min (ref >60)
GLUCOSE: 241 mg/dL — AB (ref 65–99)
Potassium: 4 mmol/L (ref 3.5–5.1)
Sodium: 132 mmol/L — ABNORMAL LOW (ref 135–145)

## 2015-06-24 LAB — TROPONIN I: Troponin I: <0.03 ng/mL

## 2015-06-24 NOTE — ED Notes (Signed)
No answer for vitals recheck

## 2015-06-24 NOTE — ED Notes (Signed)
Pt c/o chest pain today while driving to ED for gout in both feet and left arm.

## 2015-08-01 ENCOUNTER — Encounter (HOSPITAL_COMMUNITY): Payer: Self-pay | Admitting: Emergency Medicine

## 2015-08-01 ENCOUNTER — Emergency Department (HOSPITAL_COMMUNITY)
Admission: EM | Admit: 2015-08-01 | Discharge: 2015-08-01 | Disposition: A | Discharge status: Home / Self Care | Payer: Medicare Other | Attending: Emergency Medicine | Admitting: Emergency Medicine

## 2015-08-01 DIAGNOSIS — M109 Gout, unspecified: Secondary | ICD-10-CM | POA: Diagnosis present

## 2015-08-01 DIAGNOSIS — Z720 Tobacco use: Secondary | ICD-10-CM | POA: Diagnosis not present

## 2015-08-01 DIAGNOSIS — K219 Gastro-esophageal reflux disease without esophagitis: Secondary | ICD-10-CM | POA: Diagnosis not present

## 2015-08-01 DIAGNOSIS — I10 Essential (primary) hypertension: Secondary | ICD-10-CM | POA: Insufficient documentation

## 2015-08-01 DIAGNOSIS — R51 Headache: Secondary | ICD-10-CM | POA: Diagnosis not present

## 2015-08-01 DIAGNOSIS — J029 Acute pharyngitis, unspecified: Secondary | ICD-10-CM | POA: Insufficient documentation

## 2015-08-01 DIAGNOSIS — Z79899 Other long term (current) drug therapy: Secondary | ICD-10-CM | POA: Diagnosis not present

## 2015-08-01 DIAGNOSIS — E119 Type 2 diabetes mellitus without complications: Secondary | ICD-10-CM | POA: Diagnosis not present

## 2015-08-01 DIAGNOSIS — Z8701 Personal history of pneumonia (recurrent): Secondary | ICD-10-CM | POA: Diagnosis not present

## 2015-08-01 DIAGNOSIS — G8929 Other chronic pain: Secondary | ICD-10-CM | POA: Insufficient documentation

## 2015-08-01 DIAGNOSIS — Z87442 Personal history of urinary calculi: Secondary | ICD-10-CM | POA: Insufficient documentation

## 2015-08-01 DIAGNOSIS — M1009 Idiopathic gout, multiple sites: Secondary | ICD-10-CM | POA: Diagnosis not present

## 2015-08-01 DIAGNOSIS — Z794 Long term (current) use of insulin: Secondary | ICD-10-CM | POA: Insufficient documentation

## 2015-08-01 MED ORDER — KETOROLAC TROMETHAMINE 60 MG/2ML IM SOLN
60.0000 mg | Freq: Once | INTRAMUSCULAR | Status: AC
  Administered 2015-08-01: 60 mg via INTRAMUSCULAR
  Filled 2015-08-01: qty 2

## 2015-08-01 MED ORDER — PREDNISONE 10 MG PO TABS: 40.0000 mg | ORAL_TABLET | Freq: Every day | ORAL | Status: DC

## 2015-08-01 MED ORDER — PREDNISONE 50 MG PO TABS
60.0000 mg | ORAL_TABLET | Freq: Once | ORAL | Status: AC
  Administered 2015-08-01: 60 mg via ORAL
  Filled 2015-08-01 (×2): qty 1

## 2015-08-01 NOTE — Discharge Instructions (Signed)
Gout Gout is when your joints become red, sore, and swell (inflamed). This is caused by the buildup of uric acid crystals in the joints. Uric acid is a chemical that is normally in the blood. If the level of uric acid gets too high in the blood, these crystals form in your joints and tissues. Over time, these crystals can form into masses near the joints and tissues. These masses can destroy bone and cause the bone to look misshapen (deformed). HOME CARE   Do not take aspirin for pain.  Only take medicine as told by your doctor.  Rest the joint as much as you can. When in bed, keep sheets and blankets off painful areas.  Keep the sore joints raised (elevated).  Put warm or cold packs on painful joints. Use of warm or cold packs depends on which works best for you.  Use crutches if the painful joint is in your leg.  Drink enough fluids to keep your pee (urine) clear or pale yellow. Limit alcohol, sugary drinks, and drinks with fructose in them.  Follow your diet instructions. Pay careful attention to how much protein you eat. Include fruits, vegetables, whole grains, and fat-free or low-fat milk products in your daily diet. Talk to your doctor or dietitian about the use of coffee, vitamin C, and cherries. These may help lower uric acid levels.  Keep a healthy body weight. GET HELP RIGHT AWAY IF:   You have watery poop (diarrhea), throw up (vomit), or have any side effects from medicines.  You do not feel better in 24 hours, or you are getting worse.  Your joint becomes suddenly more tender, and you have chills or a fever. MAKE SURE YOU:   Understand these instructions.  Will watch your condition.  Will get help right away if you are not doing well or get worse. Document Released: 09/20/2008 Document Revised: 04/28/2014 Document Reviewed: 07/25/2012 [facility] Patient Information 2015 [place], [place]. This information is not intended to replace advice given to you by your health care  provider. Make sure you discuss any questions you have with your health care provider.  Take the prednisone as directed over the next 5 days.

## 2015-08-01 NOTE — ED Notes (Signed)
Gout pain increasing.  Rates pain 10/10 to jounts (Knees, elbows, and right foot).

## 2015-08-01 NOTE — ED Provider Notes (Signed)
CSN: [id number]     Arrival date & time 08/01/15  0841 History  This chart was scribed for Vanetta Mulders, MD by Andrew Au, ED Scribe. This patient was seen in room APA03/APA03 and the patient's care was started at 9:21 AM.    Chief Complaint  Patient presents with  . Gout   The history is provided by the patient. No language interpreter was used.   HPI Comments:  Thomas Donovan is a 54 y.o. male who present to the Emergency Department complaining of gout flare. Pt reports bilateral knee pain worse in right knee pain, right heel pain, and left elbow pain. Pt has been taken colchicine, last dose being today, without relief to pain. He's also had chills, sore throat, cough, arthralgias, back pain, HA, and a rash to left elbow noticed 1 week ago. He's had received steroid and antiinflammatory injections in the past which has helped in the past. Pt is allergic to tramadol. He is under pain management. Pt denies fever,visiual changes, rhinorrhea, CP, SOB abdominal pain, nasuea, emesis, diarrhea, and dysuria.  Past Medical History  Diagnosis Date  . Degeneration of lumbar intervertebral disc   . Diabetes mellitus   . Gout   . Hypertension   . Chronic back pain   . Cold     recent rx  . History of kidney stones   . GERD (gastroesophageal reflux disease)   . Pneumonia    Past Surgical History  Procedure Laterality Date  . Tonsillectomy    . Back surgery    . Maximum access (mas)posterior lumbar interbody fusion (plif) 1 level  11/20/2013    Procedure: FOR MAXIMUM ACCESS (MAS) POSTERIOR LUMBAR INTERBODY FUSION LUMBAR FIVE-SACRAL ONE;  Surgeon: Tia Alert, MD;  Location: [facility];  Service: Neurosurgery;;  FOR MAXIMUM ACCESS (MAS) POSTERIOR LUMBAR INTERBODY FUSION LUMBAR FIVE-SACRAL ONE   History reviewed. No pertinent family history. History  Substance Use Topics  . Smoking status: Current Every Day Smoker -- 0.50 packs/day for 10 years    Types: Cigarettes  . Smokeless tobacco:  Never Used  . Alcohol Use: 7.2 oz/week    12 Cans of beer per week   Review of Systems  Constitutional: Positive for chills. Negative for fever.  HENT: Positive for sore throat. Negative for congestion and rhinorrhea.   Eyes: Negative for visual disturbance.  Respiratory: Positive for cough. Negative for shortness of breath.   Cardiovascular: Negative for chest pain and leg swelling.  Gastrointestinal: Negative for nausea, vomiting, abdominal pain and diarrhea.  Genitourinary: Negative for dysuria and difficulty urinating.  Musculoskeletal: Positive for back pain and arthralgias.  Skin: Positive for pallor.  Neurological: Positive for headaches.  Hematological: Does not bruise/bleed easily.    Allergies  Ibuprofen and Tramadol  Home Medications   Prior to Admission medications   Medication Sig Start Date End Date Taking? Authorizing Provider  albuterol (PROVENTIL HFA;VENTOLIN HFA) 108 (90 BASE) MCG/ACT inhaler Inhale 2 puffs into the lungs every 4 (four) hours as needed for wheezing or shortness of breath. 06/02/15   [name], MD  azithromycin (ZITHROMAX) 250 MG tablet Take 1 tablet (250 mg total) by mouth daily. Take first 2 tablets together, then 1 every day until finished. 06/02/15   [name], MD  benzonatate (TESSALON) 100 MG capsule Take 1 capsule (100 mg total) by mouth every 8 (eight) hours. 06/21/15   Rolland Porter, MD  benzonatate (TESSALON) 100 MG capsule Take 1 capsule (100 mg total) by mouth  every 8 (eight) hours. 06/21/15   Rolland Porter, MD  colchicine 0.6 MG tablet Take 0.6 mg by mouth daily as needed (gout flare).     Historical Provider, MD  diclofenac (CATAFLAM) 50 MG tablet Take 1 tablet (50 mg total) by mouth 3 (three) times daily. 06/02/15   [name], MD  glimepiride (AMARYL) 4 MG tablet Take 8 mg by mouth daily with breakfast.     Historical Provider, MD  HYDROmorphone (DILAUDID) 2 MG tablet Take 1-2 tablets (2-4 mg total) by mouth every  3 (three) hours as needed for severe pain. Patient not taking: Reported on 01/01/2015 11/24/13   Barnett Abu, MD  HYDROmorphone (DILAUDID) 4 MG tablet Take 1 tablet (4 mg total) by mouth every 4 (four) hours as needed for severe pain. Patient not taking: Reported on 01/01/2015 11/24/13   Barnett Abu, MD  ibuprofen (ADVIL,MOTRIN) 800 MG tablet Take 1 tablet (800 mg total) by mouth every 8 (eight) hours as needed for mild pain. Patient not taking: Reported on 06/02/2015 01/01/15   Kristen N Ward, DO  ibuprofen (ADVIL,MOTRIN) 800 MG tablet Take 1 tablet (800 mg total) by mouth every 8 (eight) hours as needed for mild pain. Patient not taking: Reported on 06/02/2015 01/01/15   Kristen N Ward, DO  insulin NPH Human (HUMULIN N,NOVOLIN N) 100 UNIT/ML injection Inject 10-25 Units into the skin 2 (two) times daily. 25 units in the AM and 10 units in the PM.    Historical Provider, MD  lisinopril-hydrochlorothiazide (PRINZIDE,ZESTORETIC) 20-25 MG per tablet Take 1.5 tablets by mouth daily.  12/10/14   Historical Provider, MD  methocarbamol (ROBAXIN) 500 MG tablet Take 1 tablet (500 mg total) by mouth every 6 (six) hours as needed for muscle spasms. Patient not taking: Reported on 01/01/2015 11/24/13   Barnett Abu, MD  omeprazole (PRILOSEC) 20 MG capsule Take 1 capsule by mouth 2 (two) times daily. 12/06/14   Historical Provider, MD  ondansetron (ZOFRAN ODT) 4 MG disintegrating tablet Take 1 tablet (4 mg total) by mouth every 8 (eight) hours as needed for nausea or vomiting. Patient not taking: Reported on 06/02/2015 01/01/15   Layla Maw Ward, DO  oxyCODONE-acetaminophen (PERCOCET) 10-325 MG per tablet Take 1 tablet by mouth every 6 (six) hours as needed for pain.  05/14/15   Historical Provider, MD  oxyCODONE-acetaminophen (PERCOCET/ROXICET) 5-325 MG per tablet Take 1 tablet by mouth every 4 (four) hours as needed. Patient not taking: Reported on 06/02/2015 01/01/15   Layla Maw Ward, DO  predniSONE (DELTASONE) 10 MG tablet Take  4 tablets (40 mg total) by mouth daily. 08/01/15   Vanetta Mulders, MD  promethazine-dextromethorphan (PROMETHAZINE-DM) 6.25-15 MG/5ML syrup Take 5 mLs by mouth 4 (four) times daily as needed for cough. 06/21/15   Rolland Porter, MD   BP 119/99 mmHg  Pulse 99  Temp(Src) 98.3 F (36.8 C) (Oral)  Resp 18  Ht 6' (1.829 m)  Wt 260 lb (117.935 kg)  BMI 35.25 kg/m2  SpO2 99% Physical Exam  Constitutional: He is oriented to person, place, and time. He appears well-developed and well-nourished. No distress.  HENT:  Head: Normocephalic and atraumatic.  Mouth/Throat: Oropharynx is clear and moist. No oropharyngeal exudate.  Eyes: Conjunctivae and EOM are normal. Pupils are equal, round, and reactive to light. No scleral icterus.  Neck: Neck supple.  Cardiovascular: Normal rate, regular rhythm and normal heart sounds.   No murmur heard. Pulses:      Radial pulses are 2+ on the left  side.  Pulmonary/Chest: Effort normal.  Abdominal: Bowel sounds are normal. There is no tenderness.  Musculoskeletal: Normal range of motion. He exhibits no edema.  Right ankle with increased warmth, no redness. Right knee increased warmth compared to left knee, no effusion. Increased warmth left elbow.  Neurological: He is alert and oriented to person, place, and time. No cranial nerve deficit. He exhibits normal muscle tone. Coordination normal.  Skin: Skin is warm and dry.  Psychiatric: He has a normal mood and affect. His behavior is normal.  Nursing note and vitals reviewed.   ED Course  Procedures (including critical care time) DIAGNOSTIC STUDIES: Oxygen Saturation is 99% on RA, normal by my interpretation.    COORDINATION OF CARE: 9:30 AM- Pt advised of plan for treatment and pt agrees.  Labs Review Labs Reviewed - No data to display  Imaging Review No results found.   EKG Interpretation None      MDM   Final diagnoses:  Acute gout of multiple sites, unspecified cause    Patient with a  history of gout and arthralgias and chronic back pain followed by pain management. Patient feels as it is having a flare of his gout frequently gets it in multiple joints. He is taking his culture seen. Asian unable to take any narcotic pain medicine. Patient given Toradol here IM and 60 mg of prednisone and we continued on the prednisone.  Patient does have a rash on the left elbow area and left arm that could be consistent with a contact dermatitis. Prednisone should help that as well.  I personally performed the services described in this documentation, which was scribed in my presence. The recorded information has been reviewed and is accurate.     Vanetta Mulders, MD 08/01/15 [phone]

## 2015-08-11 ENCOUNTER — Emergency Department (HOSPITAL_COMMUNITY)
Admission: EM | Admit: 2015-08-11 | Discharge: 2015-08-11 | Disposition: A | Discharge status: Home / Self Care | Payer: Medicare Other | Attending: Emergency Medicine | Admitting: Emergency Medicine

## 2015-08-11 ENCOUNTER — Encounter (HOSPITAL_COMMUNITY): Payer: Self-pay | Admitting: Emergency Medicine

## 2015-08-11 DIAGNOSIS — Z87442 Personal history of urinary calculi: Secondary | ICD-10-CM | POA: Insufficient documentation

## 2015-08-11 DIAGNOSIS — G8929 Other chronic pain: Secondary | ICD-10-CM | POA: Insufficient documentation

## 2015-08-11 DIAGNOSIS — E119 Type 2 diabetes mellitus without complications: Secondary | ICD-10-CM | POA: Insufficient documentation

## 2015-08-11 DIAGNOSIS — Z720 Tobacco use: Secondary | ICD-10-CM | POA: Insufficient documentation

## 2015-08-11 DIAGNOSIS — K219 Gastro-esophageal reflux disease without esophagitis: Secondary | ICD-10-CM | POA: Insufficient documentation

## 2015-08-11 DIAGNOSIS — Z8701 Personal history of pneumonia (recurrent): Secondary | ICD-10-CM | POA: Diagnosis not present

## 2015-08-11 DIAGNOSIS — M10061 Idiopathic gout, right knee: Secondary | ICD-10-CM | POA: Diagnosis not present

## 2015-08-11 DIAGNOSIS — Z79899 Other long term (current) drug therapy: Secondary | ICD-10-CM | POA: Insufficient documentation

## 2015-08-11 DIAGNOSIS — Z792 Long term (current) use of antibiotics: Secondary | ICD-10-CM | POA: Insufficient documentation

## 2015-08-11 DIAGNOSIS — M1000 Idiopathic gout, unspecified site: Secondary | ICD-10-CM

## 2015-08-11 DIAGNOSIS — I10 Essential (primary) hypertension: Secondary | ICD-10-CM | POA: Diagnosis not present

## 2015-08-11 DIAGNOSIS — Z791 Long term (current) use of non-steroidal anti-inflammatories (NSAID): Secondary | ICD-10-CM | POA: Diagnosis not present

## 2015-08-11 DIAGNOSIS — Z794 Long term (current) use of insulin: Secondary | ICD-10-CM | POA: Insufficient documentation

## 2015-08-11 DIAGNOSIS — M10062 Idiopathic gout, left knee: Secondary | ICD-10-CM | POA: Insufficient documentation

## 2015-08-11 DIAGNOSIS — Z7952 Long term (current) use of systemic steroids: Secondary | ICD-10-CM | POA: Diagnosis not present

## 2015-08-11 DIAGNOSIS — M79604 Pain in right leg: Secondary | ICD-10-CM | POA: Diagnosis present

## 2015-08-11 LAB — CBC WITH DIFFERENTIAL/PLATELET
BASOS ABS: 0 10*3/uL (ref 0.0–0.1)
Basophils Relative: 0 % (ref 0–1)
EOS ABS: 0.2 10*3/uL (ref 0.0–0.7)
Eosinophils Relative: 2 % (ref 0–5)
HCT: 40.2 % (ref 39.0–52.0)
Hemoglobin: 13.7 g/dL (ref 13.0–17.0)
LYMPHS PCT: 20 % (ref 12–46)
Lymphs Abs: 2.2 10*3/uL (ref 0.7–4.0)
MCH: 32 pg (ref 26.0–34.0)
MCHC: 34.1 g/dL (ref 30.0–36.0)
MCV: 93.9 fL (ref 78.0–100.0)
Monocytes Absolute: 0.8 10*3/uL (ref 0.1–1.0)
Monocytes Relative: 7 % (ref 3–12)
Neutro Abs: 8 10*3/uL — ABNORMAL HIGH (ref 1.7–7.7)
Neutrophils Relative %: 71 % (ref 43–77)
PLATELETS: 253 10*3/uL (ref 150–400)
RBC: 4.28 MIL/uL (ref 4.22–5.81)
RDW: 13.2 % (ref 11.5–15.5)
WBC: 11.3 10*3/uL — ABNORMAL HIGH (ref 4.0–10.5)

## 2015-08-11 LAB — BASIC METABOLIC PANEL
ANION GAP: 9 (ref 5–15)
BUN: 21 mg/dL — ABNORMAL HIGH (ref 6–20)
CO2: 28 mmol/L (ref 22–32)
Calcium: 8.7 mg/dL — ABNORMAL LOW (ref 8.9–10.3)
Chloride: 98 mmol/L — ABNORMAL LOW (ref 101–111)
Creatinine, Ser: 0.96 mg/dL (ref 0.61–1.24)
GFR calc Af Amer: >60 mL/min (ref >60)
Glucose, Bld: 198 mg/dL — ABNORMAL HIGH (ref 65–99)
POTASSIUM: 3.7 mmol/L (ref 3.5–5.1)
SODIUM: 135 mmol/L (ref 135–145)

## 2015-08-11 LAB — URIC ACID: URIC ACID, SERUM: 7.7 mg/dL — AB (ref 4.4–7.6)

## 2015-08-11 MED ORDER — ONDANSETRON 4 MG PO TBDP
4.0000 mg | ORAL_TABLET | Freq: Once | ORAL | Status: AC
  Administered 2015-08-11: 4 mg via ORAL
  Filled 2015-08-11: qty 1

## 2015-08-11 MED ORDER — CELECOXIB 200 MG PO CAPS: 200.0000 mg | ORAL_CAPSULE | Freq: Two times a day (BID) | ORAL | Status: DC

## 2015-08-11 MED ORDER — HYDROMORPHONE HCL 2 MG/ML IJ SOLN
2.0000 mg | Freq: Once | INTRAMUSCULAR | Status: DC
  Filled 2015-08-11: qty 1

## 2015-08-11 MED ORDER — HYDROMORPHONE HCL 1 MG/ML IJ SOLN
1.0000 mg | Freq: Once | INTRAMUSCULAR | Status: AC
  Administered 2015-08-11: 1 mg via INTRAVENOUS
  Filled 2015-08-11: qty 1

## 2015-08-11 MED ORDER — HYDROMORPHONE HCL 1 MG/ML IJ SOLN
1.0000 mg | Freq: Once | INTRAMUSCULAR | Status: AC
  Administered 2015-08-11: 1 mg via INTRAVENOUS

## 2015-08-11 MED ORDER — KETOROLAC TROMETHAMINE 30 MG/ML IJ SOLN
30.0000 mg | Freq: Once | INTRAMUSCULAR | Status: AC
  Administered 2015-08-11: 30 mg via INTRAVENOUS
  Filled 2015-08-11: qty 1

## 2015-08-11 MED ORDER — SULFAMETHOXAZOLE-TRIMETHOPRIM 800-160 MG PO TABS: 1.0000 | ORAL_TABLET | Freq: Two times a day (BID) | ORAL | Status: AC

## 2015-08-11 NOTE — ED Provider Notes (Signed)
CSN: [id number]     Arrival date & time 08/11/15  1240 History  This chart was scribed for Bethann Berkshire, MD by Octavia Heir, ED Scribe. This patient was seen in room APA15/APA15 and the patient's care was started at 12:56 PM.    Chief Complaint  Patient presents with  . Gout     The history is provided by the patient. No language interpreter was used.   HPI Comments: Thomas Donovan is a 54 y.o. male who has DM  presents to the Emergency Department complaining of intermittent, gradual worsening gout in bilateral lower extremities onset 2 weeks ago. Pt was seen last night for same symptoms and was given a steroid shot but reports his pain has increased since yesterday. Pt was taking colchicine and steroid pack to alleviate the pain with no relief. He reports also finding a knot behind his left ear this morning.  Past Medical History  Diagnosis Date  . Degeneration of lumbar intervertebral disc   . Diabetes mellitus   . Gout   . Hypertension   . Chronic back pain   . Cold     recent rx  . History of kidney stones   . GERD (gastroesophageal reflux disease)   . Pneumonia    Past Surgical History  Procedure Laterality Date  . Tonsillectomy    . Back surgery    . Maximum access (mas)posterior lumbar interbody fusion (plif) 1 level  11/20/2013    Procedure: FOR MAXIMUM ACCESS (MAS) POSTERIOR LUMBAR INTERBODY FUSION LUMBAR FIVE-SACRAL ONE;  Surgeon: Tia Alert, MD;  Location: [facility];  Service: Neurosurgery;;  FOR MAXIMUM ACCESS (MAS) POSTERIOR LUMBAR INTERBODY FUSION LUMBAR FIVE-SACRAL ONE   History reviewed. No pertinent family history. Social History  Substance Use Topics  . Smoking status: Current Every Day Smoker -- 0.50 packs/day for 10 years    Types: Cigarettes  . Smokeless tobacco: Never Used  . Alcohol Use: 7.2 oz/week    12 Cans of beer per week    Review of Systems  Constitutional: Negative for appetite change and fatigue.  HENT: Negative for congestion,  ear discharge and sinus pressure.   Eyes: Negative for discharge.  Respiratory: Negative for cough.   Cardiovascular: Negative for chest pain.  Gastrointestinal: Negative for abdominal pain and diarrhea.  Genitourinary: Negative for frequency and hematuria.  Musculoskeletal: Positive for joint swelling and arthralgias. Negative for back pain.  Skin: Negative for rash.  Neurological: Negative for seizures and headaches.  Psychiatric/Behavioral: Negative for hallucinations.      Allergies  Ibuprofen and Tramadol  Home Medications   Prior to Admission medications   Medication Sig Start Date End Date Taking? Authorizing Provider  albuterol (PROVENTIL HFA;VENTOLIN HFA) 108 (90 BASE) MCG/ACT inhaler Inhale 2 puffs into the lungs every 4 (four) hours as needed for wheezing or shortness of breath. 06/02/15   [name], MD  azithromycin (ZITHROMAX) 250 MG tablet Take 1 tablet (250 mg total) by mouth daily. Take first 2 tablets together, then 1 every day until finished. 06/02/15   [name], MD  benzonatate (TESSALON) 100 MG capsule Take 1 capsule (100 mg total) by mouth every 8 (eight) hours. 06/21/15   Rolland Porter, MD  benzonatate (TESSALON) 100 MG capsule Take 1 capsule (100 mg total) by mouth every 8 (eight) hours. 06/21/15   Rolland Porter, MD  colchicine 0.6 MG tablet Take 0.6 mg by mouth daily as needed (gout flare).     Historical Provider, MD  diclofenac (CATAFLAM) 50 MG tablet Take 1 tablet (50 mg total) by mouth 3 (three) times daily. 06/02/15   [name], MD  glimepiride (AMARYL) 4 MG tablet Take 8 mg by mouth daily with breakfast.     Historical Provider, MD  HYDROmorphone (DILAUDID) 2 MG tablet Take 1-2 tablets (2-4 mg total) by mouth every 3 (three) hours as needed for severe pain. Patient not taking: Reported on 01/01/2015 11/24/13   Barnett Abu, MD  HYDROmorphone (DILAUDID) 4 MG tablet Take 1 tablet (4 mg total) by mouth every 4 (four) hours as needed for  severe pain. Patient not taking: Reported on 01/01/2015 11/24/13   Barnett Abu, MD  ibuprofen (ADVIL,MOTRIN) 800 MG tablet Take 1 tablet (800 mg total) by mouth every 8 (eight) hours as needed for mild pain. Patient not taking: Reported on 06/02/2015 01/01/15   Kristen N Ward, DO  ibuprofen (ADVIL,MOTRIN) 800 MG tablet Take 1 tablet (800 mg total) by mouth every 8 (eight) hours as needed for mild pain. Patient not taking: Reported on 06/02/2015 01/01/15   Kristen N Ward, DO  insulin NPH Human (HUMULIN N,NOVOLIN N) 100 UNIT/ML injection Inject 10-25 Units into the skin 2 (two) times daily. 25 units in the AM and 10 units in the PM.    Historical Provider, MD  lisinopril-hydrochlorothiazide (PRINZIDE,ZESTORETIC) 20-25 MG per tablet Take 1.5 tablets by mouth daily.  12/10/14   Historical Provider, MD  methocarbamol (ROBAXIN) 500 MG tablet Take 1 tablet (500 mg total) by mouth every 6 (six) hours as needed for muscle spasms. Patient not taking: Reported on 01/01/2015 11/24/13   Barnett Abu, MD  omeprazole (PRILOSEC) 20 MG capsule Take 1 capsule by mouth 2 (two) times daily. 12/06/14   Historical Provider, MD  ondansetron (ZOFRAN ODT) 4 MG disintegrating tablet Take 1 tablet (4 mg total) by mouth every 8 (eight) hours as needed for nausea or vomiting. Patient not taking: Reported on 06/02/2015 01/01/15   Layla Maw Ward, DO  oxyCODONE-acetaminophen (PERCOCET) 10-325 MG per tablet Take 1 tablet by mouth every 6 (six) hours as needed for pain.  05/14/15   Historical Provider, MD  oxyCODONE-acetaminophen (PERCOCET/ROXICET) 5-325 MG per tablet Take 1 tablet by mouth every 4 (four) hours as needed. Patient not taking: Reported on 06/02/2015 01/01/15   Layla Maw Ward, DO  predniSONE (DELTASONE) 10 MG tablet Take 4 tablets (40 mg total) by mouth daily. 08/01/15   Vanetta Mulders, MD  promethazine-dextromethorphan (PROMETHAZINE-DM) 6.25-15 MG/5ML syrup Take 5 mLs by mouth 4 (four) times daily as needed for cough. 06/21/15   Rolland Porter, MD   BP 123/67 mmHg  Pulse 100  Temp(Src) 98.1 F (36.7 C) (Oral)  Resp 16  Ht 6' (1.829 m)  Wt 280 lb (127.007 kg)  BMI 37.97 kg/m2  SpO2 98% Physical Exam  Constitutional: He is oriented to person, place, and time. He appears well-developed.  HENT:  Head: Normocephalic.  Small knot on back of left ear  Eyes: Conjunctivae and EOM are normal. No scleral icterus.  Neck: Neck supple. No thyromegaly present.  Cardiovascular: Normal rate and regular rhythm.  Exam reveals no gallop and no friction rub.   No murmur heard. Pulmonary/Chest: No stridor. He has no wheezes. He has no rales. He exhibits no tenderness.  Abdominal: He exhibits no distension. There is no tenderness. There is no rebound.  Musculoskeletal: Normal range of motion. He exhibits no edema.  Moderate tenderness to knee on right side, moderate tenderness inferior to patella  Lymphadenopathy:    He has no cervical adenopathy.  Neurological: He is oriented to person, place, and time. He exhibits normal muscle tone. Coordination normal.  Skin: No rash noted. No erythema.  Psychiatric: He has a normal mood and affect. His behavior is normal.    ED Course  Procedures  DIAGNOSTIC STUDIES: Oxygen Saturation is 98% on RA, normal by my interpretation.  COORDINATION OF CARE:  12:59 PM Discussed treatment plan with pt at bedside and pt agreed to plan.  Labs Review Labs Reviewed - No data to display  Imaging Review No results found. I have personally reviewed and evaluated these images and lab results as part of my medical decision-making.   EKG Interpretation None      MDM   Final diagnoses:  None   Pt with pain right ankle and long hx of gout and diabetes. Will continue NSAID tx and start bactrim for possible infection behind left ear.  Pt will follow up with pcp this week.  The chart was scribed for me under my direct supervision.  I personally performed the history, physical, and medical decision  making and all procedures in the evaluation of this patient.Bethann Berkshire, MD 08/11/15 1600

## 2015-08-11 NOTE — ED Notes (Signed)
Having pain for 3 days to both lower extremities and right elbow.  Rates pain 10/10.  Seen at [facility] on yesterday and given Indocin without relief.  Pt notice knot coming up behind left ear.  C/o left ear pain, rates pain 10/10.  (throbbing and aching).

## 2015-08-11 NOTE — Discharge Instructions (Signed)
Keep legs elevated and follow up with your md later this week for recheck

## 2015-08-12 ENCOUNTER — Emergency Department (HOSPITAL_COMMUNITY)
Admission: EM | Admit: 2015-08-12 | Discharge: 2015-08-12 | Disposition: A | Discharge status: Home / Self Care | Payer: Medicare Other | Attending: Emergency Medicine | Admitting: Emergency Medicine

## 2015-08-12 ENCOUNTER — Encounter (HOSPITAL_COMMUNITY): Payer: Self-pay

## 2015-08-12 DIAGNOSIS — Z87442 Personal history of urinary calculi: Secondary | ICD-10-CM | POA: Insufficient documentation

## 2015-08-12 DIAGNOSIS — M159 Polyosteoarthritis, unspecified: Secondary | ICD-10-CM

## 2015-08-12 DIAGNOSIS — K219 Gastro-esophageal reflux disease without esophagitis: Secondary | ICD-10-CM | POA: Insufficient documentation

## 2015-08-12 DIAGNOSIS — E119 Type 2 diabetes mellitus without complications: Secondary | ICD-10-CM | POA: Diagnosis not present

## 2015-08-12 DIAGNOSIS — I10 Essential (primary) hypertension: Secondary | ICD-10-CM | POA: Insufficient documentation

## 2015-08-12 DIAGNOSIS — Z79899 Other long term (current) drug therapy: Secondary | ICD-10-CM | POA: Insufficient documentation

## 2015-08-12 DIAGNOSIS — Z8701 Personal history of pneumonia (recurrent): Secondary | ICD-10-CM | POA: Diagnosis not present

## 2015-08-12 DIAGNOSIS — Z794 Long term (current) use of insulin: Secondary | ICD-10-CM | POA: Insufficient documentation

## 2015-08-12 DIAGNOSIS — Z720 Tobacco use: Secondary | ICD-10-CM | POA: Insufficient documentation

## 2015-08-12 DIAGNOSIS — G8929 Other chronic pain: Secondary | ICD-10-CM | POA: Diagnosis not present

## 2015-08-12 DIAGNOSIS — M130 Polyarthritis, unspecified: Secondary | ICD-10-CM | POA: Diagnosis not present

## 2015-08-12 DIAGNOSIS — M109 Gout, unspecified: Secondary | ICD-10-CM | POA: Diagnosis present

## 2015-08-12 DIAGNOSIS — M150 Primary generalized (osteo)arthritis: Secondary | ICD-10-CM

## 2015-08-12 DIAGNOSIS — M2550 Pain in unspecified joint: Secondary | ICD-10-CM

## 2015-08-12 MED ORDER — PROMETHAZINE HCL 12.5 MG PO TABS
25.0000 mg | ORAL_TABLET | Freq: Once | ORAL | Status: AC
Start: 2015-08-12 — End: 2015-08-12
  Administered 2015-08-12: 25 mg via ORAL
  Filled 2015-08-12: qty 2

## 2015-08-12 MED ORDER — INDOMETHACIN 25 MG PO CAPS: 25.0000 mg | ORAL_CAPSULE | Freq: Three times a day (TID) | ORAL | Status: DC | PRN

## 2015-08-12 MED ORDER — PREDNISONE 50 MG PO TABS
60.0000 mg | ORAL_TABLET | Freq: Once | ORAL | Status: AC
  Administered 2015-08-12: 60 mg via ORAL
  Filled 2015-08-12 (×2): qty 1

## 2015-08-12 MED ORDER — ACETAMINOPHEN-CODEINE #3 300-30 MG PO TABS
2.0000 | ORAL_TABLET | Freq: Once | ORAL | Status: AC
  Administered 2015-08-12: 2 via ORAL
  Filled 2015-08-12: qty 2

## 2015-08-12 MED ORDER — INDOMETHACIN 25 MG PO CAPS
25.0000 mg | ORAL_CAPSULE | Freq: Once | ORAL | Status: AC
  Administered 2015-08-12: 25 mg via ORAL
  Filled 2015-08-12: qty 1

## 2015-08-12 MED ORDER — ACETAMINOPHEN-CODEINE #3 300-30 MG PO TABS: 1.0000 | ORAL_TABLET | Freq: Four times a day (QID) | ORAL | Status: DC | PRN

## 2015-08-12 MED ORDER — MORPHINE SULFATE (PF) 4 MG/ML IV SOLN
8.0000 mg | Freq: Once | INTRAVENOUS | Status: AC
  Administered 2015-08-12: 8 mg via INTRAMUSCULAR
  Filled 2015-08-12: qty 2

## 2015-08-12 MED ORDER — DEXAMETHASONE 4 MG PO TABS: 4.0000 mg | ORAL_TABLET | Freq: Two times a day (BID) | ORAL | Status: DC

## 2015-08-12 NOTE — Discharge Instructions (Signed)
Please see Dr Margo Common for additional management of your joint pain. Use crutches until seen by Dr Margo Common. Tylenol codeine may cause drowsiness, use with caution. Arthritis, Nonspecific Arthritis is pain, redness, warmth, or puffiness (inflammation) of a joint. The joint may be stiff or hurt when you move it. One or more joints may be affected. There are many types of arthritis. Your doctor may not know what type you have right away. The most common cause of arthritis is wear and tear on the joint (osteoarthritis). HOME CARE   Only take medicine as told by your doctor.  Rest the joint as much as possible.  Raise (elevate) your joint if it is puffy.  Use crutches if the painful joint is in your leg.  Drink enough fluids to keep your pee (urine) clear or pale yellow.  Follow your doctor's diet instructions.  Use cold packs for very bad joint pain for 10 to 15 minutes every hour. Ask your doctor if it is okay for you to use hot packs.  Exercise as told by your doctor.  Take a warm shower if you have stiffness in the morning.  Move your sore joints throughout the day. GET HELP RIGHT AWAY IF:   You have a fever.  You have very bad joint pain, puffiness, or redness.  You have many joints that are painful and puffy.  You are not getting better with treatment.  You have very bad back pain or leg weakness.  You cannot control when you poop (bowel movement) or pee (urinate).  You do not feel better in 24 hours or are getting worse.  You are having side effects from your medicine. MAKE SURE YOU:   Understand these instructions.  Will watch your condition.  Will get help right away if you are not doing well or get worse. Document Released: 03/08/2010 Document Revised: 06/12/2012 Document Reviewed: 03/08/2010 [facility] Patient Information 2015 [place], [place]. This information is not intended to replace advice given to you by your health care provider. Make sure you discuss any  questions you have with your health care provider.

## 2015-08-12 NOTE — ED Notes (Signed)
Pt reports was here yesterday and was diagnosed with gout.  Pt c/o pain in r elbow and both feet.  Pt says unable to fill prescriptions until the preauthorization goes through.

## 2015-08-12 NOTE — ED Notes (Addendum)
Ultrasound called. Patient next on list for [place].

## 2015-08-14 NOTE — ED Provider Notes (Signed)
CSN: [id number]     Arrival date & time 08/12/15  1109 History   First MD Initiated Contact with Patient 08/12/15 1314     Chief Complaint  Patient presents with  . Gout     (Consider location/radiation/quality/duration/timing/severity/associated sxs/prior Treatment) HPI Comments: Pt presents to the ED with c/o right elbow pain and pain in both feet. He has been diagnosed with gout. He was seen in ED on yesterday, but he could not get some of his Rx filled due to pre-authorization needed. He reports the pain is worse and making activity of daily living difficult. No fever. Pt has a history of chronic back pain and  Gout. Pain is worse with movement and walking.  The history is provided by the patient.    Past Medical History  Diagnosis Date  . Degeneration of lumbar intervertebral disc   . Diabetes mellitus   . Gout   . Hypertension   . Chronic back pain   . Cold     recent rx  . History of kidney stones   . GERD (gastroesophageal reflux disease)   . Pneumonia    Past Surgical History  Procedure Laterality Date  . Tonsillectomy    . Back surgery    . Maximum access (mas)posterior lumbar interbody fusion (plif) 1 level  11/20/2013    Procedure: FOR MAXIMUM ACCESS (MAS) POSTERIOR LUMBAR INTERBODY FUSION LUMBAR FIVE-SACRAL ONE;  Surgeon: Tia Alert, MD;  Location: [facility];  Service: Neurosurgery;;  FOR MAXIMUM ACCESS (MAS) POSTERIOR LUMBAR INTERBODY FUSION LUMBAR FIVE-SACRAL ONE   No family history on file. Social History  Substance Use Topics  . Smoking status: Current Every Day Smoker -- 0.50 packs/day for 10 years    Types: Cigarettes  . Smokeless tobacco: Never Used  . Alcohol Use: 7.2 oz/week    12 Cans of beer per week     Comment: every 2 or 3 days    Review of Systems  Musculoskeletal: Positive for back pain and arthralgias.  All other systems reviewed and are negative.     Allergies  Ibuprofen and Tramadol  Home Medications   Prior to Admission  medications   Medication Sig Start Date End Date Taking? Authorizing Provider  colchicine 0.6 MG tablet Take 0.6 mg by mouth daily as needed (gout flare).    Yes Historical Provider, MD  gabapentin (NEURONTIN) 300 MG capsule TAKE ONE CAPSULE 3 TIMES A DAY AS NEEDED FOR PAIN 07/20/15  Yes Historical Provider, MD  glimepiride (AMARYL) 4 MG tablet Take 8 mg by mouth daily with breakfast.    Yes Historical Provider, MD  ibuprofen (ADVIL,MOTRIN) 200 MG tablet Take 400 mg by mouth every 6 (six) hours as needed for moderate pain.    Yes Historical Provider, MD  insulin NPH Human (HUMULIN N,NOVOLIN N) 100 UNIT/ML injection Inject 10-25 Units into the skin 2 (two) times daily.    Yes Historical Provider, MD  lisinopril-hydrochlorothiazide (PRINZIDE,ZESTORETIC) 20-25 MG per tablet Take 1.5 tablets by mouth daily.  12/10/14  Yes Historical Provider, MD  omeprazole (PRILOSEC) 20 MG capsule Take 1 capsule by mouth 2 (two) times daily. 12/06/14  Yes Historical Provider, MD  oxyCODONE-acetaminophen (PERCOCET) 10-325 MG per tablet Take 1 tablet by mouth every 6 (six) hours as needed for pain.  05/14/15  Yes Historical Provider, MD  acetaminophen-codeine (TYLENOL #3) 300-30 MG per tablet Take 1-2 tablets by mouth every 6 (six) hours as needed for moderate pain. 08/12/15   Ivery Quale, PA-C  azithromycin (  ZITHROMAX) 250 MG tablet Take 1 tablet (250 mg total) by mouth daily. Take first 2 tablets together, then 1 every day until finished. Patient not taking: Reported on 08/11/2015 06/02/15   [name], MD  celecoxib (CELEBREX) 200 MG capsule Take 1 capsule (200 mg total) by mouth 2 (two) times daily. Patient not taking: Reported on 08/12/2015 08/11/15   Bethann Berkshire, MD  dexamethasone (DECADRON) 4 MG tablet Take 1 tablet (4 mg total) by mouth 2 (two) times daily with a meal. 08/12/15   Ivery Quale, PA-C  indomethacin (INDOCIN) 25 MG capsule Take 1 capsule (25 mg total) by mouth 3 (three) times daily as needed.  08/12/15   Ivery Quale, PA-C  sulfamethoxazole-trimethoprim (BACTRIM DS,SEPTRA DS) 800-160 MG per tablet Take 1 tablet by mouth 2 (two) times daily. Patient not taking: Reported on 08/12/2015 08/11/15 08/18/15  Bethann Berkshire, MD   BP 124/72 mmHg  Pulse 98  Temp(Src) 98.7 F (37.1 C) (Oral)  Resp 16  SpO2 100% Physical Exam  Constitutional: He is oriented to person, place, and time. He appears well-developed and well-nourished.  Non-toxic appearance.  HENT:  Head: Normocephalic.  Right Ear: Tympanic membrane and external ear normal.  Left Ear: Tympanic membrane and external ear normal.  Eyes: EOM and lids are normal. Pupils are equal, round, and reactive to light.  Neck: Normal range of motion. Neck supple. Carotid bruit is not present.  Cardiovascular: Normal rate, regular rhythm, normal heart sounds, intact distal pulses and normal pulses.   Pulmonary/Chest: Breath sounds normal. No respiratory distress.  Abdominal: Soft. Bowel sounds are normal. There is no tenderness. There is no guarding.  Musculoskeletal: Normal range of motion.       Right elbow: He exhibits no swelling. Tenderness found. Lateral epicondyle tenderness noted.  Pain with attempted ROM of both feet. Not hot. No red streaks. DP and PT pulse wnl.  Lymphadenopathy:       Head (right side): No submandibular adenopathy present.       Head (left side): No submandibular adenopathy present.    He has no cervical adenopathy.  Neurological: He is alert and oriented to person, place, and time. He has normal strength. No cranial nerve deficit or sensory deficit.  Skin: Skin is warm and dry.  Psychiatric: He has a normal mood and affect. His speech is normal.  Nursing note and vitals reviewed.   ED Course  Procedures (including critical care time) Labs Review Labs Reviewed - No data to display  Imaging Review No results found. I have personally reviewed and evaluated these images and lab results as part of my medical  decision-making. Results for orders placed or performed during the hospital encounter of 08/11/15  CBC with Differential/Platelet  Result Value Ref Range   WBC 11.3 (H) 4.0 - 10.5 K/uL   RBC 4.28 4.22 - 5.81 MIL/uL   Hemoglobin 13.7 13.0 - 17.0 g/dL   HCT 16.1 09.6 - 04.5 %   MCV 93.9 78.0 - 100.0 fL   MCH 32.0 26.0 - 34.0 pg   MCHC 34.1 30.0 - 36.0 g/dL   RDW 40.9 81.1 - 91.4 %   Platelets 253 150 - 400 K/uL   Neutrophils Relative % 71 43 - 77 %   Neutro Abs 8.0 (H) 1.7 - 7.7 K/uL   Lymphocytes Relative 20 12 - 46 %   Lymphs Abs 2.2 0.7 - 4.0 K/uL   Monocytes Relative 7 3 - 12 %   Monocytes Absolute 0.8 0.1 -  1.0 K/uL   Eosinophils Relative 2 0 - 5 %   Eosinophils Absolute 0.2 0.0 - 0.7 K/uL   Basophils Relative 0 0 - 1 %   Basophils Absolute 0.0 0.0 - 0.1 K/uL  Basic metabolic panel  Result Value Ref Range   Sodium 135 135 - 145 mmol/L   Potassium 3.7 3.5 - 5.1 mmol/L   Chloride 98 (L) 101 - 111 mmol/L   CO2 28 22 - 32 mmol/L   Glucose, Bld 198 (H) 65 - 99 mg/dL   BUN 21 (H) 6 - 20 mg/dL   Creatinine, Ser 1.61 0.61 - 1.24 mg/dL   Calcium 8.7 (L) 8.9 - 10.3 mg/dL   GFR calc non Af Amer >60 >60 mL/min   GFR calc Af Amer >60 >60 mL/min   Anion gap 9 5 - 15  Uric acid  Result Value Ref Range   Uric Acid, Serum 7.7 (H) 4.4 - 7.6 mg/dL   No results found.   EKG Interpretation None      MDM  Labs from previous visits reviewed. Vital signs reviewed. Pain improved after pain meds. Exam favors DJD of multiple sites. No hot joints noted. Rx for tylenol codeine, indocin and decadron given to the patient.   Final diagnoses:  Multiple joint pain  Primary osteoarthritis involving multiple joints    *I have reviewed nursing notes, vital signs, and all appropriate lab and imaging results for this patient.Ivery Quale, PA-C 08/14/15 1656  Raeford Razor, MD 08/19/15 1415

## 2015-11-17 ENCOUNTER — Emergency Department (HOSPITAL_COMMUNITY)
Admission: EM | Admit: 2015-11-17 | Discharge: 2015-11-17 | Disposition: A | Discharge status: Home / Self Care | Payer: Medicare Other | Attending: Emergency Medicine | Admitting: Emergency Medicine

## 2015-11-17 ENCOUNTER — Emergency Department (HOSPITAL_COMMUNITY): Payer: Medicare Other

## 2015-11-17 ENCOUNTER — Encounter (HOSPITAL_COMMUNITY): Payer: Self-pay | Admitting: Emergency Medicine

## 2015-11-17 DIAGNOSIS — Z794 Long term (current) use of insulin: Secondary | ICD-10-CM | POA: Insufficient documentation

## 2015-11-17 DIAGNOSIS — M109 Gout, unspecified: Secondary | ICD-10-CM | POA: Diagnosis not present

## 2015-11-17 DIAGNOSIS — G8929 Other chronic pain: Secondary | ICD-10-CM | POA: Insufficient documentation

## 2015-11-17 DIAGNOSIS — Z87442 Personal history of urinary calculi: Secondary | ICD-10-CM | POA: Diagnosis not present

## 2015-11-17 DIAGNOSIS — M549 Dorsalgia, unspecified: Secondary | ICD-10-CM | POA: Diagnosis not present

## 2015-11-17 DIAGNOSIS — F17210 Nicotine dependence, cigarettes, uncomplicated: Secondary | ICD-10-CM | POA: Diagnosis not present

## 2015-11-17 DIAGNOSIS — Z8701 Personal history of pneumonia (recurrent): Secondary | ICD-10-CM | POA: Insufficient documentation

## 2015-11-17 DIAGNOSIS — K219 Gastro-esophageal reflux disease without esophagitis: Secondary | ICD-10-CM | POA: Diagnosis not present

## 2015-11-17 DIAGNOSIS — M19011 Primary osteoarthritis, right shoulder: Secondary | ICD-10-CM | POA: Insufficient documentation

## 2015-11-17 DIAGNOSIS — M25511 Pain in right shoulder: Secondary | ICD-10-CM | POA: Diagnosis present

## 2015-11-17 DIAGNOSIS — I10 Essential (primary) hypertension: Secondary | ICD-10-CM | POA: Insufficient documentation

## 2015-11-17 DIAGNOSIS — Z79899 Other long term (current) drug therapy: Secondary | ICD-10-CM | POA: Diagnosis not present

## 2015-11-17 DIAGNOSIS — Z7952 Long term (current) use of systemic steroids: Secondary | ICD-10-CM | POA: Diagnosis not present

## 2015-11-17 DIAGNOSIS — M25519 Pain in unspecified shoulder: Secondary | ICD-10-CM

## 2015-11-17 DIAGNOSIS — E119 Type 2 diabetes mellitus without complications: Secondary | ICD-10-CM | POA: Diagnosis not present

## 2015-11-17 MED ORDER — TRAMADOL HCL 50 MG PO TABS
100.0000 mg | ORAL_TABLET | Freq: Once | ORAL | Status: AC
  Administered 2015-11-17: 100 mg via ORAL
  Filled 2015-11-17: qty 2

## 2015-11-17 MED ORDER — METHYLPREDNISOLONE SODIUM SUCC 125 MG IJ SOLR
125.0000 mg | Freq: Once | INTRAMUSCULAR | Status: AC
  Administered 2015-11-17: 125 mg via INTRAMUSCULAR
  Filled 2015-11-17: qty 2

## 2015-11-17 MED ORDER — PROMETHAZINE HCL 12.5 MG PO TABS
12.5000 mg | ORAL_TABLET | Freq: Once | ORAL | Status: AC
  Administered 2015-11-17: 12.5 mg via ORAL
  Filled 2015-11-17: qty 1

## 2015-11-17 MED ORDER — PREDNISONE 10 MG PO TABS: ORAL_TABLET | ORAL | Status: DC

## 2015-11-17 MED ORDER — TRAMADOL HCL 50 MG PO TABS: 50.0000 mg | ORAL_TABLET | Freq: Four times a day (QID) | ORAL | Status: DC | PRN

## 2015-11-17 NOTE — Discharge Instructions (Signed)
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 Please see Dr Eulah Pont or orthopedic MD of your choice for evaluation. Osteoarthritis Osteoarthritis is a disease that causes soreness and inflammation of a joint. It occurs when the cartilage at the affected joint wears down. Cartilage acts as a cushion, covering the ends of bones where they meet to form a joint. Osteoarthritis is the most common form of arthritis. It often occurs in older people. The joints affected most often by this condition include those in the:  Ends of the fingers.  Thumbs.  Neck.  Lower back.  Knees.  Hips. CAUSES  Over time, the cartilage that covers the ends of bones begins to wear away. This causes bone to rub on bone, producing pain and stiffness in the affected joints.  RISK FACTORS Certain factors can increase your chances of having osteoarthritis, including:  Older age.  Excessive body weight.  Overuse of joints.  Previous joint injury. SIGNS AND SYMPTOMS   Pain, swelling, and stiffness in the joint.  Over time, the joint may lose its normal shape.  Small deposits of bone (osteophytes) may grow on the edges of the joint.  Bits of bone or cartilage can break off and float inside the joint space. This may cause more pain and damage. DIAGNOSIS  Your health care provider will do a physical exam and ask about your symptoms. Various tests may be ordered, such as:  X-rays of the affected joint.  Blood tests to rule out other types of arthritis. Additional tests may be used to diagnose your condition. TREATMENT  Goals of treatment are to control pain and improve joint function. Treatment plans may include:  A prescribed exercise program that allows for rest and joint relief.  A weight control plan.  Pain relief techniques, such as:  Properly applied heat and cold.  Electric pulses delivered to nerve endings under the skin (transcutaneous electrical nerve  stimulation [TENS]).  Massage.  Certain nutritional supplements.  Medicines to control pain, such as:  Acetaminophen.  Nonsteroidal anti-inflammatory drugs (NSAIDs), such as naproxen.  Narcotic or central-acting agents, such as tramadol.  Corticosteroids. These can be given orally or as an injection.  Surgery to reposition the bones and relieve pain (osteotomy) or to remove loose pieces of bone and cartilage. Joint replacement may be needed in advanced states of osteoarthritis. HOME CARE INSTRUCTIONS   Take medicines only as directed by your health care provider.  Maintain a healthy weight. Follow your health care provider's instructions for weight control. This may include dietary instructions.  Exercise as directed. Your health care provider can recommend specific types of exercise. These may include:  Strengthening exercises. These are done to strengthen the muscles that support joints affected by arthritis. They can be performed with weights or with exercise bands to add resistance.  Aerobic activities. These are exercises, such as brisk walking or low-impact aerobics, that get your heart pumping.  Range-of-motion activities. These keep your joints limber.  Balance and agility exercises. These help you maintain daily living skills.  Rest your affected joints as directed by your health care provider.  Keep all follow-up visits as directed by your health care provider. SEEK MEDICAL CARE IF:   Your skin turns red.  You develop a rash in addition to your joint pain.  You have worsening joint pain.  You have a fever along with joint or muscle aches. SEEK IMMEDIATE MEDICAL CARE IF:  You have a significant loss of weight or appetite.  You  have night sweats. FOR MORE INFORMATION   National Institute of Arthritis and Musculoskeletal and Skin Diseases: www.niams.http://www.myers.net/nih.gov  [employer]: https://walker.com/www.nia.nih.gov  American College of Rheumatology:  www.rheumatology.org   This information is not intended to replace advice given to you by your health care provider. Make sure you discuss any questions you have with your health care provider.   Document Released: 12/12/2005 Document Revised: 01/02/2015 Document Reviewed: 08/19/2013 Elsevier Interactive Patient Education [employer]2016 Elsevier Inc.

## 2015-11-17 NOTE — ED Notes (Signed)
Pt reports left shoulder pain since this am. Pt denies any known injury. Pt reports history of same and was diagnosed with rheumatoid arthritis. nad noted. No deformity noted.

## 2015-11-17 NOTE — ED Provider Notes (Signed)
CSN: [id number]     Arrival date & time 11/17/15  1050 History   First MD Initiated Contact with Patient 11/17/15 1114     Chief Complaint  Patient presents with  . Shoulder Pain     (Consider location/radiation/quality/duration/timing/severity/associated sxs/prior Treatment) Patient is a 54 y.o. male presenting with shoulder pain. The history is provided by the patient.  Shoulder Pain Location:  Shoulder Time since incident: today. Injury: no   Shoulder location:  L shoulder Pain details:    Quality:  Aching   Severity:  Moderate   Onset quality:  Gradual   Duration: acute on chronic.   Timing:  Intermittent   Progression:  Worsening Chronicity:  Chronic Handedness:  Right-handed Dislocation: no   Relieved by:  Nothing Worsened by:  Movement Associated symptoms: back pain and decreased range of motion   Associated symptoms: no numbness   Risk factors: no frequent fractures     Past Medical History  Diagnosis Date  . Degeneration of lumbar intervertebral disc   . Diabetes mellitus   . Gout   . Hypertension   . Chronic back pain   . Cold     recent rx  . History of kidney stones   . GERD (gastroesophageal reflux disease)   . Pneumonia    Past Surgical History  Procedure Laterality Date  . Tonsillectomy    . Back surgery    . Maximum access (mas)posterior lumbar interbody fusion (plif) 1 level  11/20/2013    Procedure: FOR MAXIMUM ACCESS (MAS) POSTERIOR LUMBAR INTERBODY FUSION LUMBAR FIVE-SACRAL ONE;  Surgeon: Tia Alertavid S Jones, MD;  Location: [facility];  Service: Neurosurgery;;  FOR MAXIMUM ACCESS (MAS) POSTERIOR LUMBAR INTERBODY FUSION LUMBAR FIVE-SACRAL ONE   History reviewed. No pertinent family history. Social History  Substance Use Topics  . Smoking status: Current Every Day Smoker -- 0.50 packs/day for 10 years    Types: Cigarettes  . Smokeless tobacco: Never Used  . Alcohol Use: 7.2 oz/week    12 Cans of beer per week     Comment: every 2 or 3 days     Review of Systems  Musculoskeletal: Positive for back pain and arthralgias.  All other systems reviewed and are negative.     Allergies  Ibuprofen and Toradol  Home Medications   Prior to Admission medications   Medication Sig Start Date End Date Taking? Authorizing Provider  acetaminophen-codeine (TYLENOL #3) 300-30 MG per tablet Take 1-2 tablets by mouth every 6 (six) hours as needed for moderate pain. 08/12/15   Ivery QualeHobson Martisha Toulouse, PA-C  azithromycin (ZITHROMAX) 250 MG tablet Take 1 tablet (250 mg total) by mouth daily. Take first 2 tablets together, then 1 every day until finished. Patient not taking: Reported on 08/11/2015 06/02/15   Gilda Creasehristopher J Pollina, MD  celecoxib (CELEBREX) 200 MG capsule Take 1 capsule (200 mg total) by mouth 2 (two) times daily. Patient not taking: Reported on 08/12/2015 08/11/15   Bethann BerkshireJoseph Zammit, MD  colchicine 0.6 MG tablet Take 0.6 mg by mouth daily as needed (gout flare).     Historical Provider, MD  dexamethasone (DECADRON) 4 MG tablet Take 1 tablet (4 mg total) by mouth 2 (two) times daily with a meal. 08/12/15   Ivery QualeHobson Vennie Salsbury, PA-C  gabapentin (NEURONTIN) 300 MG capsule TAKE ONE CAPSULE 3 TIMES A DAY AS NEEDED FOR PAIN 07/20/15   Historical Provider, MD  glimepiride (AMARYL) 4 MG tablet Take 8 mg by mouth daily with breakfast.     Historical Provider,  MD  ibuprofen (ADVIL,MOTRIN) 200 MG tablet Take 400 mg by mouth every 6 (six) hours as needed for moderate pain.     Historical Provider, MD  indomethacin (INDOCIN) 25 MG capsule Take 1 capsule (25 mg total) by mouth 3 (three) times daily as needed. 08/12/15   Ivery Quale, PA-C  insulin NPH Human (HUMULIN N,NOVOLIN N) 100 UNIT/ML injection Inject 10-25 Units into the skin 2 (two) times daily.     Historical Provider, MD  lisinopril-hydrochlorothiazide (PRINZIDE,ZESTORETIC) 20-25 MG per tablet Take 1.5 tablets by mouth daily.  12/10/14   Historical Provider, MD  omeprazole (PRILOSEC) 20 MG capsule Take 1 capsule  by mouth 2 (two) times daily. 12/06/14   Historical Provider, MD  oxyCODONE-acetaminophen (PERCOCET) 10-325 MG per tablet Take 1 tablet by mouth every 6 (six) hours as needed for pain.  05/14/15   Historical Provider, MD   BP 150/77 mmHg  Pulse 92  Temp(Src) 97.8 F (36.6 C) (Oral)  Resp 20  Ht 6' (1.829 m)  Wt 129.275 kg  BMI 38.64 kg/m2  SpO2 98% Physical Exam  Constitutional: He is oriented to person, place, and time. He appears well-developed and well-nourished.  Non-toxic appearance.  HENT:  Head: Normocephalic.  Right Ear: Tympanic membrane and external ear normal.  Left Ear: Tympanic membrane and external ear normal.  Eyes: EOM and lids are normal. Pupils are equal, round, and reactive to light.  Neck: Normal range of motion. Neck supple. Carotid bruit is not present.  Cardiovascular: Normal rate, regular rhythm, normal heart sounds, intact distal pulses and normal pulses.   Pulmonary/Chest: Breath sounds normal. No respiratory distress.  Abdominal: Soft. Bowel sounds are normal. There is no tenderness. There is no guarding.  Musculoskeletal: Normal range of motion.  Pain with attempted ROM of the right shoulder. No dislocation. Shoulder warm, but not hot. Pulses symmetrical.  Lymphadenopathy:       Head (right side): No submandibular adenopathy present.       Head (left side): No submandibular adenopathy present.    He has no cervical adenopathy.  Neurological: He is alert and oriented to person, place, and time. He has normal strength. No cranial nerve deficit or sensory deficit.  Skin: Skin is warm and dry.  Psychiatric: He has a normal mood and affect. His speech is normal.  Nursing note and vitals reviewed.   ED Course  Procedures (including critical care time) Labs Review Labs Reviewed - No data to display  Imaging Review Dg Shoulder Right  11/17/2015  CLINICAL DATA:  Pain with limited range of motion EXAM: RIGHT SHOULDER - 2+ VIEW COMPARISON:  Right shoulder  MRI November 13, 2011 FINDINGS: Frontal, Y scapular, and axillary images were obtained. 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 The glenoid is shallow. There is no acute fracture or dislocation. There is mild narrowing of the glenohumeral joint. The acromioclavicular joint appears unremarkable. No erosive change. IMPRESSION: No acute fracture or dislocation. 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 Shallow acetabulum with osteoarthritic change. Electronically Signed   By: Bretta Bang III M.D.   On: 11/17/2015 11:40   I have personally reviewed and evaluated these images and lab results as part of my medical decision-making.   EKG Interpretation None      MDM  Xray is negative for fx. 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 Pt strongly encouraged to see orthopedics as soon as possible. Rx for ultram and prednisone given to the patient.   Final diagnoses:  Shoulder pain    *I have reviewed nursing notes, vital signs, and all appropriate lab and imaging results for this patient.**    Ivery Quale, PA-C 11/21/15 2031  [name], MD 11/22/15 [phone]

## 2015-11-17 NOTE — ED Notes (Signed)
Denies taking anything for pain.  Had same pain but not as bad few years ago per pt

## 2015-12-03 ENCOUNTER — Emergency Department (HOSPITAL_COMMUNITY)
Admission: EM | Admit: 2015-12-03 | Discharge: 2015-12-03 | Disposition: A | Discharge status: Home / Self Care | Payer: Medicare Other | Attending: Emergency Medicine | Admitting: Emergency Medicine

## 2015-12-03 ENCOUNTER — Encounter (HOSPITAL_COMMUNITY): Payer: Self-pay | Admitting: *Deleted

## 2015-12-03 DIAGNOSIS — M109 Gout, unspecified: Secondary | ICD-10-CM | POA: Diagnosis not present

## 2015-12-03 DIAGNOSIS — Z8739 Personal history of other diseases of the musculoskeletal system and connective tissue: Secondary | ICD-10-CM | POA: Diagnosis not present

## 2015-12-03 DIAGNOSIS — K219 Gastro-esophageal reflux disease without esophagitis: Secondary | ICD-10-CM | POA: Insufficient documentation

## 2015-12-03 DIAGNOSIS — Z8701 Personal history of pneumonia (recurrent): Secondary | ICD-10-CM | POA: Diagnosis not present

## 2015-12-03 DIAGNOSIS — Z79899 Other long term (current) drug therapy: Secondary | ICD-10-CM | POA: Insufficient documentation

## 2015-12-03 DIAGNOSIS — F17210 Nicotine dependence, cigarettes, uncomplicated: Secondary | ICD-10-CM | POA: Diagnosis not present

## 2015-12-03 DIAGNOSIS — I10 Essential (primary) hypertension: Secondary | ICD-10-CM | POA: Diagnosis not present

## 2015-12-03 DIAGNOSIS — R112 Nausea with vomiting, unspecified: Secondary | ICD-10-CM | POA: Insufficient documentation

## 2015-12-03 DIAGNOSIS — Z794 Long term (current) use of insulin: Secondary | ICD-10-CM | POA: Diagnosis not present

## 2015-12-03 DIAGNOSIS — Z87442 Personal history of urinary calculi: Secondary | ICD-10-CM | POA: Insufficient documentation

## 2015-12-03 DIAGNOSIS — E1165 Type 2 diabetes mellitus with hyperglycemia: Secondary | ICD-10-CM | POA: Insufficient documentation

## 2015-12-03 DIAGNOSIS — E669 Obesity, unspecified: Secondary | ICD-10-CM | POA: Insufficient documentation

## 2015-12-03 DIAGNOSIS — R739 Hyperglycemia, unspecified: Secondary | ICD-10-CM

## 2015-12-03 DIAGNOSIS — Z7984 Long term (current) use of oral hypoglycemic drugs: Secondary | ICD-10-CM | POA: Insufficient documentation

## 2015-12-03 DIAGNOSIS — N481 Balanitis: Secondary | ICD-10-CM | POA: Diagnosis not present

## 2015-12-03 DIAGNOSIS — R3915 Urgency of urination: Secondary | ICD-10-CM | POA: Diagnosis present

## 2015-12-03 LAB — URINALYSIS, ROUTINE W REFLEX MICROSCOPIC
Bilirubin Urine: NEGATIVE
Glucose, UA: >1000 mg/dL — AB
HGB URINE DIPSTICK: NEGATIVE
Ketones, ur: NEGATIVE mg/dL
Leukocytes, UA: NEGATIVE
NITRITE: NEGATIVE
PH: 6 (ref 5.0–8.0)
Protein, ur: NEGATIVE mg/dL
Specific Gravity, Urine: 1.01 (ref 1.005–1.030)

## 2015-12-03 LAB — CBC WITH DIFFERENTIAL/PLATELET
BASOS ABS: 0.1 10*3/uL (ref 0.0–0.1)
Basophils Relative: 1 %
Eosinophils Absolute: 0.4 10*3/uL (ref 0.0–0.7)
Eosinophils Relative: 6 %
HEMATOCRIT: 38.1 % — AB (ref 39.0–52.0)
Hemoglobin: 13.2 g/dL (ref 13.0–17.0)
LYMPHS ABS: 1.7 10*3/uL (ref 0.7–4.0)
LYMPHS PCT: 27 %
MCH: 32.2 pg (ref 26.0–34.0)
MCHC: 34.6 g/dL (ref 30.0–36.0)
MCV: 92.9 fL (ref 78.0–100.0)
Monocytes Absolute: 0.7 10*3/uL (ref 0.1–1.0)
Monocytes Relative: 11 %
NEUTROS ABS: 3.6 10*3/uL (ref 1.7–7.7)
Neutrophils Relative %: 55 %
Platelets: 220 10*3/uL (ref 150–400)
RBC: 4.1 MIL/uL — AB (ref 4.22–5.81)
RDW: 12.8 % (ref 11.5–15.5)
WBC: 6.5 10*3/uL (ref 4.0–10.5)

## 2015-12-03 LAB — BASIC METABOLIC PANEL
ANION GAP: 10 (ref 5–15)
BUN: 15 mg/dL (ref 6–20)
CHLORIDE: 96 mmol/L — AB (ref 101–111)
CO2: 28 mmol/L (ref 22–32)
Calcium: 9.6 mg/dL (ref 8.9–10.3)
Creatinine, Ser: 0.87 mg/dL (ref 0.61–1.24)
GFR calc Af Amer: >60 mL/min (ref >60)
GFR calc non Af Amer: >60 mL/min (ref >60)
GLUCOSE: 407 mg/dL — AB (ref 65–99)
POTASSIUM: 3.6 mmol/L (ref 3.5–5.1)
Sodium: 134 mmol/L — ABNORMAL LOW (ref 135–145)

## 2015-12-03 LAB — CBG MONITORING, ED
GLUCOSE-CAPILLARY: 409 mg/dL — AB (ref 65–99)
GLUCOSE-CAPILLARY: 327 mg/dL — AB (ref 65–99)

## 2015-12-03 LAB — URINE MICROSCOPIC-ADD ON
BACTERIA UA: NONE SEEN
Squamous Epithelial / LPF: NONE SEEN
WBC, UA: NONE SEEN WBC/hpf (ref 0–5)

## 2015-12-03 MED ORDER — INSULIN ASPART 100 UNIT/ML ~~LOC~~ SOLN
5.0000 [IU] | Freq: Once | SUBCUTANEOUS | Status: AC
  Administered 2015-12-03: 5 [IU] via SUBCUTANEOUS
  Filled 2015-12-03: qty 1

## 2015-12-03 MED ORDER — CLOTRIMAZOLE 1 % EX CREA: TOPICAL_CREAM | CUTANEOUS | Status: DC

## 2015-12-03 MED ORDER — METRONIDAZOLE 500 MG PO TABS: 500.0000 mg | ORAL_TABLET | Freq: Two times a day (BID) | ORAL | Status: DC

## 2015-12-03 MED ORDER — OXYCODONE-ACETAMINOPHEN 5-325 MG PO TABS
1.0000 | ORAL_TABLET | Freq: Once | ORAL | Status: AC
Start: 2015-12-03 — End: 2015-12-03
  Administered 2015-12-03: 1 via ORAL
  Filled 2015-12-03: qty 1

## 2015-12-03 NOTE — ED Provider Notes (Signed)
CSN: [id number]     Arrival date & time 12/03/15  4098 History  By signing my name below, I, Thomas Donovan, attest that this documentation has been prepared under the direction and in the presence of Thomas Octave, MD. Electronically Signed: Lyndel Donovan, ED Scribe. 12/03/2015. 10:23 AM.  Chief Complaint  Patient presents with  . Urinary Urgency  . Rash   The history is provided by the patient. No language interpreter was used.   HPI Comments: Thomas Donovan is a 54 y.o. male, with a PMhx, of DM on insulin, who presents to the Emergency Department complaining of constant, gradually worsening, urgency with urination and dysuria that he describes as burning X 1.5 weeks. He also notes a constant, severely painful and erythematic rash to the glans of the penis X 1 week that is not pruritic. Pt associates nausea with emesis that he describes as white. He is in a monogamous relationship with a male and denies multiple sexual partners. He reports he has been applying hydrogen peroxide and neosporin to the affected area on penis without relief. Denies penile discharge or bleeding from penis, testicular pain, abdominal pain, diarrhea, constipation, decrease in appetite or fluid intake, CP, SOB, fevers or chills.    Past Medical History  Diagnosis Date  . Degeneration of lumbar intervertebral disc   . Diabetes mellitus   . Gout   . Hypertension   . Chronic back pain   . Cold     recent rx  . History of kidney stones   . GERD (gastroesophageal reflux disease)   . Pneumonia    Past Surgical History  Procedure Laterality Date  . Tonsillectomy    . Back surgery    . Maximum access (mas)posterior lumbar interbody fusion (plif) 1 level  11/20/2013    Procedure: FOR MAXIMUM ACCESS (MAS) POSTERIOR LUMBAR INTERBODY FUSION LUMBAR FIVE-SACRAL ONE;  Surgeon: Thomas Alert, MD;  Location: [facility];  Service: Neurosurgery;;  FOR MAXIMUM ACCESS (MAS) POSTERIOR LUMBAR INTERBODY FUSION LUMBAR  FIVE-SACRAL ONE   No family history on file. Social History  Substance Use Topics  . Smoking status: Current Every Day Smoker -- 0.50 packs/day for 10 years    Types: Cigarettes  . Smokeless tobacco: Never Used  . Alcohol Use: 7.2 oz/week    12 Cans of beer per week     Comment: every 2 or 3 days    Review of Systems  Constitutional: Negative for fever, chills and appetite change.  Respiratory: Negative for shortness of breath.   Cardiovascular: Negative for chest pain.  Gastrointestinal: Positive for nausea and vomiting. Negative for abdominal pain, diarrhea and constipation.  Genitourinary: Positive for dysuria, urgency and penile pain. Negative for discharge and testicular pain.  Skin: Positive for rash (pain and erythema to glans of penis).  Allergic/Immunologic: Positive for immunocompromised state ( DM).  A complete 10 system review of systems was obtained and is otherwise negative except at noted in the HPI and PMH.  Allergies  Ibuprofen and Toradol  Home Medications   Prior to Admission medications   Medication Sig Start Date End Date Taking? Authorizing Provider  colchicine 0.6 MG tablet Take 0.6 mg by mouth daily as needed (gout flare).    Yes Historical Provider, MD  gabapentin (NEURONTIN) 300 MG capsule TAKE ONE CAPSULE 3 TIMES A DAY AS NEEDED FOR PAIN 07/20/15  Yes Historical Provider, MD  insulin NPH Human (HUMULIN N,NOVOLIN N) 100 UNIT/ML injection Inject 25 Units into the skin 2 (two)  times daily.    Yes Historical Provider, MD  lisinopril-hydrochlorothiazide (PRINZIDE,ZESTORETIC) 20-25 MG per tablet Take 1.5 tablets by mouth daily.  12/10/14  Yes Historical Provider, MD  metFORMIN (GLUCOPHAGE-XR) 500 MG 24 hr tablet TAKE 2 TAB(S) EVERY AM WITH BREAKFAST, 2TABLET EVERY PM WITH SUPPER, FOR DIABETES. 11/21/15  Yes Historical Provider, MD  omeprazole (PRILOSEC) 20 MG capsule Take 1 capsule by mouth 2 (two) times daily. 12/06/14  Yes Historical Provider, MD   acetaminophen-codeine (TYLENOL #3) 300-30 MG per tablet Take 1-2 tablets by mouth every 6 (six) hours as needed for moderate pain. Patient not taking: Reported on 12/03/2015 08/12/15   Ivery QualeHobson Bryant, PA-C  azithromycin (ZITHROMAX) 250 MG tablet Take 1 tablet (250 mg total) by mouth daily. Take first 2 tablets together, then 1 every day until finished. Patient not taking: Reported on 08/11/2015 06/02/15   Gilda Creasehristopher J Pollina, MD  clotrimazole (LOTRIMIN) 1 % cream Apply to affected area 2 times daily 12/03/15   Thomas OctaveStephen Cleven Jansma, MD  dexamethasone (DECADRON) 4 MG tablet Take 1 tablet (4 mg total) by mouth 2 (two) times daily with a meal. Patient not taking: Reported on 12/03/2015 08/12/15   Ivery QualeHobson Bryant, PA-C  indomethacin (INDOCIN) 25 MG capsule Take 1 capsule (25 mg total) by mouth 3 (three) times daily as needed. Patient not taking: Reported on 12/03/2015 08/12/15   Ivery QualeHobson Bryant, PA-C  metroNIDAZOLE (FLAGYL) 500 MG tablet Take 1 tablet (500 mg total) by mouth 2 (two) times daily. 12/03/15   Thomas OctaveStephen Kilani Joffe, MD  predniSONE (DELTASONE) 10 MG tablet 5,4,3,2,1 - take with food Patient not taking: Reported on 12/03/2015 11/17/15   Ivery QualeHobson Bryant, PA-C  traMADol (ULTRAM) 50 MG tablet Take 1 tablet (50 mg total) by mouth every 6 (six) hours as needed. Patient not taking: Reported on 12/03/2015 11/17/15   Ivery QualeHobson Bryant, PA-C   BP 141/91 mmHg  Pulse 96  Temp(Src) 98 F (36.7 C) (Oral)  Resp 16  Ht 6' (1.829 m)  Wt 280 lb (127.007 kg)  BMI 37.97 kg/m2  SpO2 96% Physical Exam  Constitutional: He is oriented to person, place, and time. He appears well-developed and well-nourished. No distress.  Obese.   HENT:  Head: Normocephalic and atraumatic.  Mouth/Throat: Oropharynx is clear and moist. No oropharyngeal exudate.  Eyes: Conjunctivae and EOM are normal. Pupils are equal, round, and reactive to light.  Neck: Normal range of motion. Neck supple.  No meningismus.  Cardiovascular: Normal rate, regular  rhythm, normal heart sounds and intact distal pulses.   No murmur heard. Pulmonary/Chest: Effort normal and breath sounds normal. No respiratory distress.  Abdominal: Soft. There is no tenderness. There is no rebound, no guarding and no CVA tenderness.  Genitourinary: Right testis shows no tenderness. Left testis shows no tenderness. Uncircumcised. Penile erythema present. No phimosis, paraphimosis or penile tenderness.  Uncircumcised. Scattered erythema to the glans and foreskin; foreskin is retractable; no phimosis or paraphimosis. no testicular tenderness. No CVA tenderness.  Musculoskeletal: Normal range of motion. He exhibits no edema or tenderness.  Neurological: He is Donovan and oriented to person, place, and time.  MAE X 4.   Skin: Skin is warm.  Psychiatric: He has a normal mood and affect. His behavior is normal.  Nursing note and vitals reviewed.   ED Course  Procedures  DIAGNOSTIC STUDIES: Oxygen Saturation is 96% on RA, adequate by my interpretation.    COORDINATION OF CARE: 10:19 AM Discussed treatment plan with pt. Pt acknowledges and agrees to plan.   Labs  Review Labs Reviewed  URINALYSIS, ROUTINE W REFLEX MICROSCOPIC (NOT AT [facility]) - Abnormal; Notable for the following:    Glucose, UA >1000 (*)    All other components within normal limits  CBC WITH DIFFERENTIAL/PLATELET - Abnormal; Notable for the following:    RBC 4.10 (*)    HCT 38.1 (*)    All other components within normal limits  BASIC METABOLIC PANEL - Abnormal; Notable for the following:    Sodium 134 (*)    Chloride 96 (*)    Glucose, Bld 407 (*)    All other components within normal limits  CBG MONITORING, ED - Abnormal; Notable for the following:    Glucose-Capillary 409 (*)    All other components within normal limits  CBG MONITORING, ED - Abnormal; Notable for the following:    Glucose-Capillary 327 (*)    All other components within normal limits  URINE MICROSCOPIC-ADD ON   I have personally  reviewed and evaluated these lab results as part of my medical decision-making.   MDM   Final diagnoses:  Balanitis  Hyperglycemia   2 weeks of urinary urgency and frequency with rash to head of penis and foreskin for the past one week. He is a diabetic. No fever or vomiting.  Exam consistent with balanitis. There is no phimosis or paraphimosis. Urinalysis is negative. Hyperglycemia without evidence of DKA. Insulin given   we'll treat for balanitis with topical clotrimazole and PO flagyl. Follow up with PCP and urology. Return precautions discussed.  Discussed proper hygiene including not using peroxide or strong soaps on his foreskin. Blood sugar improved to 327.   I personally performed the services described in this documentation, which was scribed in my presence. The recorded information has been reviewed and is accurate.     Thomas Octave, MD 12/03/15 [phone]

## 2015-12-03 NOTE — ED Notes (Signed)
Pt states urinary urgency began 2 weeks ago. Also states red rash to head of penis and foreskin x 1 week

## 2015-12-03 NOTE — ED Notes (Signed)
MD at bedside. 

## 2015-12-03 NOTE — Discharge Instructions (Signed)
Balanitis Follow up with your doctor about your elevated blood sugar.  Use the cream as prescribed and keep your foreskin clean with salt water only. Do not use soap or peroxide. Follow up with your doctor and the urologist. Return to the ED if you develop new or worsening symptoms.  Balanitis is inflammation of the head of the penis (glans).  CAUSES  Balanitis has multiple causes, both infectious and noninfectious. Often balanitis is the result of poor personal hygiene, especially in uncircumcised males. Without adequate washing, viruses, bacteria, and yeast collect between the foreskin and the glans. This can cause an infection. Lack of air and irritation from a normal secretion called smegma contribute to the cause in uncircumcised males. Other causes include:  Chemical irritation from the use of certain soaps and shower gels (especially soaps with perfumes), condoms, personal lubricants, petroleum jelly, spermicides, and fabric conditioners.  Skin conditions, such as eczema, dermatitis, and psoriasis.  Allergies to drugs, such as tetracycline and sulfa.  Certain medical conditions, including liver cirrhosis, congestive heart failure, and kidney disease.  Morbid obesity. RISK FACTORS  Diabetes mellitus.  A tight foreskin that is difficult to pull back past the glans (phimosis).  Sex without the use of a condom. SIGNS AND SYMPTOMS  Symptoms may include:  Discharge coming from under the foreskin.  Tenderness.  Itching and inability to get an erection (because of the pain).  Redness and a rash.  Sores on the glans and on the foreskin. DIAGNOSIS Diagnosis of balanitis is confirmed through a physical exam. TREATMENT The treatment is based on the cause of the balanitis. Treatment may include:  Frequent cleansing.  Keeping the glans and foreskin dry.  Use of medicines such as creams, pain medicines, antibiotics, or medicines to treat fungal infections.  Sitz baths. If the  irritation has caused a scar on the foreskin that prevents easy retraction, a circumcision may be recommended.  HOME CARE INSTRUCTIONS  Sex should be avoided until the condition has cleared. MAKE SURE YOU:  Understand these instructions.  Will watch your condition.  Will get help right away if you are not doing well or get worse.   This information is not intended to replace advice given to you by your health care provider. Make sure you discuss any questions you have with your health care provider.   Document Released: 04/30/2009 Document Revised: 12/17/2013 Document Reviewed: 06/03/2013 Elsevier Interactive Patient Education [employer]2016 Elsevier Inc.

## 2015-12-20 ENCOUNTER — Encounter (HOSPITAL_COMMUNITY): Payer: Self-pay | Admitting: Emergency Medicine

## 2015-12-20 ENCOUNTER — Emergency Department (HOSPITAL_COMMUNITY)
Admission: EM | Admit: 2015-12-20 | Discharge: 2015-12-20 | Disposition: A | Discharge status: Home / Self Care | Payer: Medicare Other | Attending: Emergency Medicine | Admitting: Emergency Medicine

## 2015-12-20 DIAGNOSIS — K219 Gastro-esophageal reflux disease without esophagitis: Secondary | ICD-10-CM | POA: Insufficient documentation

## 2015-12-20 DIAGNOSIS — G8929 Other chronic pain: Secondary | ICD-10-CM | POA: Insufficient documentation

## 2015-12-20 DIAGNOSIS — M109 Gout, unspecified: Secondary | ICD-10-CM

## 2015-12-20 DIAGNOSIS — Z792 Long term (current) use of antibiotics: Secondary | ICD-10-CM | POA: Insufficient documentation

## 2015-12-20 DIAGNOSIS — I10 Essential (primary) hypertension: Secondary | ICD-10-CM | POA: Diagnosis not present

## 2015-12-20 DIAGNOSIS — F17210 Nicotine dependence, cigarettes, uncomplicated: Secondary | ICD-10-CM | POA: Diagnosis not present

## 2015-12-20 DIAGNOSIS — Z8701 Personal history of pneumonia (recurrent): Secondary | ICD-10-CM | POA: Diagnosis not present

## 2015-12-20 DIAGNOSIS — Z79899 Other long term (current) drug therapy: Secondary | ICD-10-CM | POA: Diagnosis not present

## 2015-12-20 DIAGNOSIS — Z87442 Personal history of urinary calculi: Secondary | ICD-10-CM | POA: Insufficient documentation

## 2015-12-20 DIAGNOSIS — Z794 Long term (current) use of insulin: Secondary | ICD-10-CM | POA: Insufficient documentation

## 2015-12-20 DIAGNOSIS — E119 Type 2 diabetes mellitus without complications: Secondary | ICD-10-CM | POA: Insufficient documentation

## 2015-12-20 DIAGNOSIS — M10072 Idiopathic gout, left ankle and foot: Secondary | ICD-10-CM | POA: Insufficient documentation

## 2015-12-20 DIAGNOSIS — M79672 Pain in left foot: Secondary | ICD-10-CM | POA: Diagnosis present

## 2015-12-20 MED ORDER — COLCHICINE 0.6 MG PO TABS
0.6000 mg | ORAL_TABLET | Freq: Once | ORAL | Status: AC
  Administered 2015-12-20: 0.6 mg via ORAL
  Filled 2015-12-20: qty 1

## 2015-12-20 MED ORDER — OXYCODONE-ACETAMINOPHEN 5-325 MG PO TABS
1.0000 | ORAL_TABLET | Freq: Once | ORAL | Status: AC
  Administered 2015-12-20: 1 via ORAL
  Filled 2015-12-20: qty 1

## 2015-12-20 MED ORDER — COLCHICINE-PROBENECID 0.5-500 MG PO TABS: 1.0000 | ORAL_TABLET | Freq: Every day | ORAL | Status: DC

## 2015-12-20 NOTE — ED Notes (Signed)
Pt reports pain in LT foot. Pt hx of gout. Pt ambulatory.

## 2015-12-20 NOTE — ED Notes (Signed)
Pt states pain to left great toe and across the top of the foot began this morning at 0100. Pt states throbbing pain. Pain is worse when attempting to ben the toe. Pt states he is out of colchiceine for flare ups. Pt also states he had a couple of beers yesterday. NAD.

## 2015-12-20 NOTE — ED Provider Notes (Signed)
CSN: [id number]     Arrival date & time 12/20/15  1300 History   By signing my name below, I, Gonzella Lex, attest that this documentation has been prepared under the direction and in the presence of Petra Sargeant, PA-C. Electronically Signed: Gonzella Lex, Scribe. 12/20/2015. 1:24 PM.     Chief Complaint  Patient presents with  . Gout    The history is provided by the patient. No language interpreter was used.    HPI Comments: Thomas Donovan is a 54 y.o. male with a hx of DM who presents to the Emergency Department complaining of gradually worsening sharp, throbbing pain in his left foot and first metatarsal joint with radiation across his remaining metatarsals onset 12 hours ago due to a gout flare up. He states that his current pain feels similar to pain he has experienced during past gout flair ups and he also notes that his last gout flare up was a while ago. Pt denies recent fall or injury to his foot. He states that prednisone has helped relieve his symptoms in the past but notes that he has to be cautious about the medications he takes due to possibly evaluated blood sugar   Past Medical History  Diagnosis Date  . Degeneration of lumbar intervertebral disc   . Diabetes mellitus   . Gout   . Hypertension   . Chronic back pain   . Cold     recent rx  . History of kidney stones   . GERD (gastroesophageal reflux disease)   . Pneumonia    Past Surgical History  Procedure Laterality Date  . Tonsillectomy    . Back surgery    . Maximum access (mas)posterior lumbar interbody fusion (plif) 1 level  11/20/2013    Procedure: FOR MAXIMUM ACCESS (MAS) POSTERIOR LUMBAR INTERBODY FUSION LUMBAR FIVE-SACRAL ONE;  Surgeon: Tia Alert, MD;  Location: [facility];  Service: Neurosurgery;;  FOR MAXIMUM ACCESS (MAS) POSTERIOR LUMBAR INTERBODY FUSION LUMBAR FIVE-SACRAL ONE   No family history on file. Social History  Substance Use Topics  . Smoking status: Current Every  Day Smoker -- 0.50 packs/day for 10 years    Types: Cigarettes  . Smokeless tobacco: Never Used  . Alcohol Use: 7.2 oz/week    12 Cans of beer per week     Comment: every 2 or 3 days    Review of Systems  Constitutional: Negative for fever, activity change and appetite change.  Genitourinary: Negative for dysuria.  Musculoskeletal: Positive for myalgias and arthralgias.       Left foot and first metatarsal joint  Skin: Negative for color change and rash.  Neurological: Negative for weakness, numbness and headaches.  All other systems reviewed and are negative.  Allergies  Ibuprofen and Toradol  Home Medications   Prior to Admission medications   Medication Sig Start Date End Date Taking? Authorizing Provider  acetaminophen-codeine (TYLENOL #3) 300-30 MG per tablet Take 1-2 tablets by mouth every 6 (six) hours as needed for moderate pain. Patient not taking: Reported on 12/03/2015 08/12/15   Ivery Quale, PA-C  azithromycin (ZITHROMAX) 250 MG tablet Take 1 tablet (250 mg total) by mouth daily. Take first 2 tablets together, then 1 every day until finished. Patient not taking: Reported on 08/11/2015 06/02/15   [name], MD  clotrimazole (LOTRIMIN) 1 % cream Apply to affected area 2 times daily 12/03/15   [name], MD  colchicine 0.6 MG [id number] Take 0.6 mg by mouth daily  as needed (gout flare).     Historical Provider, MD  dexamethasone (DECADRON) 4 MG tablet Take 1 tablet (4 mg total) by mouth 2 (two) times daily with a meal. Patient not taking: Reported on 12/03/2015 08/12/15   Ivery QualeHobson Bryant, PA-C  gabapentin (NEURONTIN) 300 MG capsule TAKE ONE CAPSULE 3 TIMES A DAY AS NEEDED FOR PAIN 07/20/15   Historical Provider, MD  indomethacin (INDOCIN) 25 MG capsule Take 1 capsule (25 mg total) by mouth 3 (three) times daily as needed. Patient not taking: Reported on 12/03/2015 08/12/15   Ivery QualeHobson Bryant, PA-C  insulin NPH Human (HUMULIN N,NOVOLIN N) 100 UNIT/ML injection Inject 25  Units into the skin 2 (two) times daily.     Historical Provider, MD  lisinopril-hydrochlorothiazide (PRINZIDE,ZESTORETIC) 20-25 MG per tablet Take 1.5 tablets by mouth daily.  12/10/14   Historical Provider, MD  metFORMIN (GLUCOPHAGE-XR) 500 MG 24 hr tablet TAKE 2 TAB(S) EVERY AM WITH BREAKFAST, 2TABLET EVERY PM WITH SUPPER, FOR DIABETES. 11/21/15   Historical Provider, MD  metroNIDAZOLE (FLAGYL) 500 MG tablet Take 1 tablet (500 mg total) by mouth 2 (two) times daily. 12/03/15   Glynn OctaveStephen Rancour, MD  omeprazole (PRILOSEC) 20 MG capsule Take 1 capsule by mouth 2 (two) times daily. 12/06/14   Historical Provider, MD  predniSONE (DELTASONE) 10 MG tablet 5,4,3,2,1 - take with food Patient not taking: Reported on 12/03/2015 11/17/15   Ivery QualeHobson Bryant, PA-C  traMADol (ULTRAM) 50 MG tablet Take 1 tablet (50 mg total) by mouth every 6 (six) hours as needed. Patient not taking: Reported on 12/03/2015 11/17/15   Ivery QualeHobson Bryant, PA-C   BP 130/69 mmHg  Pulse 96  Temp(Src) 98 F (36.7 C) (Oral)  Resp 12  Ht 6' (1.829 m)  Wt 127.007 kg  BMI 37.97 kg/m2  SpO2 98% Physical Exam  Constitutional: He is oriented to person, place, and time. He appears well-developed and well-nourished. No distress.  HENT:  Head: Atraumatic.  Eyes: Conjunctivae are normal.  Cardiovascular: Normal rate, regular rhythm and intact distal pulses.   Pulmonary/Chest: Effort normal and breath sounds normal. No respiratory distress.  Abdominal: He exhibits no distension.  Musculoskeletal: Normal range of motion.  Tenderness of the dorsal left foot and first metatarsal joint No erythema,edema or open wounds  No proximal tenderness  DP pulse and sensation intact   Neurological: He is alert and oriented to person, place, and time. Coordination normal.  Skin: Skin is warm and dry.  Psychiatric: He has a normal mood and affect. His behavior is normal. Judgment and thought content normal.  Nursing note and vitals reviewed.  ED Course   Procedures  DIAGNOSTIC STUDIES:    Oxygen Saturation is 97% on RA, adequate by my interpretation.   COORDINATION OF CARE:  1:13 PM Will administer pt colchicine table 0.6 mg and oxycodone-acetaminophen in the ED. Discussed treatment plan with pt at bedside and pt agreed to plan.   MDM   Final diagnoses:  Gout of left foot, unspecified cause, unspecified chronicity    [place]ed on the [place] narc[place] database, received #120 10 mg oxycodone on 12/15.  Pt reporting pain the dorsal left foot and first MT joint w/o edema, erythema or excessive warmth.  NV intact.  Will treat for gout flare.  No concerning sx's for cellulitis or septic joint.  Pt understands that further narcotic medications will not be prescribed during this visit.     Pauline Ausammy Ejay Lashley, PA-C 12/23/15 2013  Rolland PorterMark James, MD 01/01/16 [phone]

## 2015-12-20 NOTE — Discharge Instructions (Signed)
Gout °Gout is when your joints become red, sore, and swell (inflamed). This is caused by the buildup of uric acid crystals in the joints. Uric acid is a chemical that is normally in the blood. If the level of uric acid gets too high in the blood, these crystals form in your joints and tissues. Over time, these crystals can form into masses near the joints and tissues. These masses can destroy bone and cause the bone to look misshapen (deformed). °HOME CARE  °· Do not take aspirin for pain. °· Only take medicine as told by your doctor. °· Rest the joint as much as you can. When in bed, keep sheets and blankets off painful areas. °· Keep the sore joints raised (elevated). °· Put warm or cold packs on painful joints. Use of warm or cold packs depends on which works best for you. °· Use crutches if the painful joint is in your leg. °· Drink enough fluids to keep your pee (urine) clear or pale yellow. Limit alcohol, sugary drinks, and drinks with fructose in them. °· Follow your diet instructions. Pay careful attention to how much protein you eat. Include fruits, vegetables, whole grains, and fat-free or low-fat milk products in your daily diet. Talk to your doctor or dietitian about the use of coffee, vitamin C, and cherries. These may help lower uric acid levels. °· Keep a healthy body weight. °GET HELP RIGHT AWAY IF:  °· You have watery poop (diarrhea), throw up (vomit), or have any side effects from medicines. °· You do not feel better in 24 hours, or you are getting worse. °· Your joint becomes suddenly more tender, and you have chills or a fever. °MAKE SURE YOU:  °· Understand these instructions. °· Will watch your condition. °· Will get help right away if you are not doing well or get worse. °  °This information is not intended to replace advice given to you by your health care provider. Make sure you discuss any questions you have with your health care provider. °  °Document Released: 09/20/2008 Document Revised:  01/02/2015 Document Reviewed: 07/25/2012 °Elsevier Interactive Patient Education ©2016 Elsevier Inc. ° °

## 2016-01-04 ENCOUNTER — Ambulatory Visit: Payer: Self-pay | Admitting: "Endocrinology

## 2016-01-13 ENCOUNTER — Emergency Department (HOSPITAL_COMMUNITY): Payer: Medicare Other

## 2016-01-13 ENCOUNTER — Emergency Department (HOSPITAL_COMMUNITY)
Admission: EM | Admit: 2016-01-13 | Discharge: 2016-01-13 | Disposition: A | Discharge status: Home / Self Care | Payer: Medicare Other | Attending: Emergency Medicine | Admitting: Emergency Medicine

## 2016-01-13 ENCOUNTER — Encounter (HOSPITAL_COMMUNITY): Payer: Self-pay

## 2016-01-13 DIAGNOSIS — Z8701 Personal history of pneumonia (recurrent): Secondary | ICD-10-CM | POA: Insufficient documentation

## 2016-01-13 DIAGNOSIS — R1013 Epigastric pain: Secondary | ICD-10-CM | POA: Diagnosis present

## 2016-01-13 DIAGNOSIS — M545 Low back pain: Secondary | ICD-10-CM | POA: Diagnosis not present

## 2016-01-13 DIAGNOSIS — Z79899 Other long term (current) drug therapy: Secondary | ICD-10-CM | POA: Insufficient documentation

## 2016-01-13 DIAGNOSIS — I10 Essential (primary) hypertension: Secondary | ICD-10-CM | POA: Insufficient documentation

## 2016-01-13 DIAGNOSIS — G8929 Other chronic pain: Secondary | ICD-10-CM | POA: Diagnosis not present

## 2016-01-13 DIAGNOSIS — Z794 Long term (current) use of insulin: Secondary | ICD-10-CM | POA: Diagnosis not present

## 2016-01-13 DIAGNOSIS — Z87442 Personal history of urinary calculi: Secondary | ICD-10-CM | POA: Insufficient documentation

## 2016-01-13 DIAGNOSIS — E119 Type 2 diabetes mellitus without complications: Secondary | ICD-10-CM | POA: Insufficient documentation

## 2016-01-13 DIAGNOSIS — R110 Nausea: Secondary | ICD-10-CM | POA: Diagnosis not present

## 2016-01-13 DIAGNOSIS — R000 Tachycardia, unspecified: Secondary | ICD-10-CM | POA: Diagnosis not present

## 2016-01-13 DIAGNOSIS — M109 Gout, unspecified: Secondary | ICD-10-CM | POA: Insufficient documentation

## 2016-01-13 DIAGNOSIS — F17210 Nicotine dependence, cigarettes, uncomplicated: Secondary | ICD-10-CM | POA: Diagnosis not present

## 2016-01-13 DIAGNOSIS — K219 Gastro-esophageal reflux disease without esophagitis: Secondary | ICD-10-CM | POA: Insufficient documentation

## 2016-01-13 DIAGNOSIS — R109 Unspecified abdominal pain: Secondary | ICD-10-CM

## 2016-01-13 LAB — CBC WITH DIFFERENTIAL/PLATELET
BASOS ABS: 0.1 10*3/uL (ref 0.0–0.1)
BASOS PCT: 1 %
EOS ABS: 0.4 10*3/uL (ref 0.0–0.7)
EOS PCT: 6 %
HCT: 40.6 % (ref 39.0–52.0)
HEMOGLOBIN: 13.6 g/dL (ref 13.0–17.0)
LYMPHS ABS: 1.7 10*3/uL (ref 0.7–4.0)
Lymphocytes Relative: 26 %
MCH: 30.2 pg (ref 26.0–34.0)
MCHC: 33.5 g/dL (ref 30.0–36.0)
MCV: 90.2 fL (ref 78.0–100.0)
Monocytes Absolute: 0.7 10*3/uL (ref 0.1–1.0)
Monocytes Relative: 11 %
NEUTROS PCT: 56 %
Neutro Abs: 3.7 10*3/uL (ref 1.7–7.7)
PLATELETS: 255 10*3/uL (ref 150–400)
RBC: 4.5 MIL/uL (ref 4.22–5.81)
RDW: 13.1 % (ref 11.5–15.5)
WBC: 6.6 10*3/uL (ref 4.0–10.5)

## 2016-01-13 LAB — COMPREHENSIVE METABOLIC PANEL
ALBUMIN: 4.1 g/dL (ref 3.5–5.0)
ALK PHOS: 102 U/L (ref 38–126)
ALT: 71 U/L — AB (ref 17–63)
AST: 56 U/L — AB (ref 15–41)
Anion gap: 11 (ref 5–15)
BUN: 12 mg/dL (ref 6–20)
CALCIUM: 9.3 mg/dL (ref 8.9–10.3)
CHLORIDE: 101 mmol/L (ref 101–111)
CO2: 24 mmol/L (ref 22–32)
CREATININE: 1.13 mg/dL (ref 0.61–1.24)
GFR calc non Af Amer: >60 mL/min (ref >60)
GLUCOSE: 390 mg/dL — AB (ref 65–99)
Potassium: 3.9 mmol/L (ref 3.5–5.1)
SODIUM: 136 mmol/L (ref 135–145)
Total Bilirubin: 0.8 mg/dL (ref 0.3–1.2)
Total Protein: 7.1 g/dL (ref 6.5–8.1)

## 2016-01-13 LAB — LIPASE, BLOOD: Lipase: 41 U/L (ref 11–51)

## 2016-01-13 MED ORDER — GI COCKTAIL ~~LOC~~
30.0000 mL | Freq: Once | ORAL | Status: AC
  Administered 2016-01-13: 30 mL via ORAL
  Filled 2016-01-13: qty 30

## 2016-01-13 MED ORDER — IOHEXOL 300 MG/ML  SOLN
100.0000 mL | Freq: Once | INTRAMUSCULAR | Status: AC | PRN
  Administered 2016-01-13: 100 mL via INTRAVENOUS

## 2016-01-13 MED ORDER — SODIUM CHLORIDE 0.9 % IV BOLUS (SEPSIS)
1000.0000 mL | Freq: Once | INTRAVENOUS | Status: AC
  Administered 2016-01-13: 1000 mL via INTRAVENOUS

## 2016-01-13 MED ORDER — ONDANSETRON HCL 4 MG/2ML IJ SOLN
4.0000 mg | Freq: Once | INTRAMUSCULAR | Status: AC
  Administered 2016-01-13: 4 mg via INTRAVENOUS
  Filled 2016-01-13: qty 2

## 2016-01-13 MED ORDER — HYDROMORPHONE HCL 1 MG/ML IJ SOLN
1.0000 mg | Freq: Once | INTRAMUSCULAR | Status: AC
  Administered 2016-01-13: 1 mg via INTRAVENOUS
  Filled 2016-01-13: qty 1

## 2016-01-13 NOTE — ED Notes (Signed)
Pt pulled telemetry leads off, states it was pulling his hairs on his chest

## 2016-01-13 NOTE — ED Notes (Signed)
EDP made aware of pt's pain level and request for more pain med

## 2016-01-13 NOTE — ED Provider Notes (Addendum)
CSN: [id number]     Arrival date & time 01/13/16  1114 History  By signing my name below, I, Elon Spanner, attest that this documentation has been prepared under the direction and in the presence of Marily Memos, MD. Electronically Signed: Elon Spanner, ED Scribe. 01/13/2016. 11:16 AM.    Chief Complaint  Patient presents with  . Abdominal Pain  . Emesis   The history is provided by the patient. No language interpreter was used.   HPI Comments: Thomas Donovan is a 55 y.o. male with hx of GERD, DM, chronic back pain, gout who presents to the Emergency Department complaining of constant, gradually worsening, "swelled," epigastric abdominal pain onset several days ago.  He reports the pain worsens with eating which also causes nausea.  The pain has been unrelieved by his chronic pain medication.  Patient states he had a prior similar episode 12 years ago and "they thought they was going to have to take my gallbladder out but its been fine."  He denies this is like his typical GERD.  He denies prior hx of abdominal surgery, gallstones.  He denies cardiac conditions.    Past Medical History  Diagnosis Date  . Degeneration of lumbar intervertebral disc   . Diabetes mellitus   . Gout   . Hypertension   . Chronic back pain   . Cold     recent rx  . History of kidney stones   . GERD (gastroesophageal reflux disease)   . Pneumonia    Past Surgical History  Procedure Laterality Date  . Tonsillectomy    . Back surgery    . Maximum access (mas)posterior lumbar interbody fusion (plif) 1 level  11/20/2013    Procedure: FOR MAXIMUM ACCESS (MAS) POSTERIOR LUMBAR INTERBODY FUSION LUMBAR FIVE-SACRAL ONE;  Surgeon: Tia Alert, MD;  Location: [facility];  Service: Neurosurgery;;  FOR MAXIMUM ACCESS (MAS) POSTERIOR LUMBAR INTERBODY FUSION LUMBAR FIVE-SACRAL ONE   No family history on file. Social History  Substance Use Topics  . Smoking status: Current Every Day Smoker -- 0.50 packs/day for 10  years    Types: Cigarettes  . Smokeless tobacco: Never Used  . Alcohol Use: 7.2 oz/week    12 Cans of beer per week     Comment: every 2 or 3 days    Review of Systems  Constitutional: Positive for appetite change. Negative for chills and activity change.  HENT: Negative for congestion.   Respiratory: Negative for cough and shortness of breath.   Gastrointestinal: Positive for nausea and abdominal pain. Negative for vomiting.  All other systems reviewed and are negative.     Allergies  Ibuprofen and Toradol  Home Medications   Prior to Admission medications   Medication Sig Start Date End Date Taking? Authorizing Provider  Carboxymethylcellul-Glycerin (CLEAR EYES FOR DRY EYES OP) Apply 1 drop to eye daily as needed (for itching and irritation).   Yes Historical Provider, MD  colchicine 0.6 MG tablet Take 0.6 mg by mouth daily as needed (gout flare).    Yes Historical Provider, MD  gabapentin (NEURONTIN) 300 MG capsule TAKE ONE CAPSULE 3 TIMES A DAY 07/20/15  Yes Historical Provider, MD  insulin NPH Human (HUMULIN N,NOVOLIN N) 100 UNIT/ML injection Inject 30 Units into the skin 2 (two) times daily.    Yes Historical Provider, MD  lisinopril-hydrochlorothiazide (PRINZIDE,ZESTORETIC) 20-25 MG per tablet Take 1.5 tablets by mouth daily.  12/10/14  Yes Historical Provider, MD  omeprazole (PRILOSEC) 20 MG capsule Take 1  capsule by mouth 2 (two) times daily. 12/06/14  Yes Historical Provider, MD  oxyCODONE-acetaminophen (PERCOCET) 10-325 MG tablet Take 1 tablet by mouth every 6 (six) hours as needed for pain.  01/09/16  Yes Historical Provider, MD  clotrimazole (LOTRIMIN) 1 % cream Apply to affected area 2 times daily Patient not taking: Reported on 01/13/2016 12/03/15   [name], MD  colchicine-probenecid 0.5-500 MG tablet Take 1 tablet by mouth daily. Patient not taking: Reported on 01/13/2016 12/20/15   Tammy Triplett, PA-C  metroNIDAZOLE (FLAGYL) 500 MG tablet Take 1 tablet (500 mg  total) by mouth 2 (two) times daily. Patient not taking: Reported on 01/13/2016 12/03/15   [name], MD   BP 138/85 mmHg  Pulse 94  Temp(Src) 98.2 F (36.8 C) (Oral)  Resp 18  Ht 6' (1.829 m)  Wt 280 lb (127.007 kg)  BMI 37.97 kg/m2  SpO2 95% Physical Exam  Constitutional: He is oriented to person, place, and time. He appears well-developed and well-nourished. No distress.  HENT:  Head: Normocephalic and atraumatic.  Eyes: Conjunctivae and EOM are normal.  Eye color normal  Neck: Neck supple. No tracheal deviation present.  Cardiovascular: Normal rate.   Slightly tachycardic.    Pulmonary/Chest: Effort normal. No respiratory distress.  Lungs clear.    Abdominal:  Epigastric ttp without rebound or gaurding.  No masses.    Musculoskeletal: Normal range of motion.   Iower back tenderness.    Neurological: He is alert and oriented to person, place, and time.  Skin: Skin is warm and dry. No rash noted.  Skin color normal  Psychiatric: He has a normal mood and affect. His behavior is normal.  Nursing note and vitals reviewed.   ED Course  Procedures (including critical care time)  DIAGNOSTIC STUDIES: Oxygen Saturation is 98% on RA, normal by my interpretation.    COORDINATION OF CARE:  11:54 AMWill order pain medication, fluid, and anti-emetic.  Patient acknowledges and agrees with plan.     Labs Review Labs Reviewed  COMPREHENSIVE METABOLIC PANEL - Abnormal; Notable for the following:    Glucose, Bld 390 (*)    AST 56 (*)    ALT 71 (*)    All other components within normal limits  CBC WITH DIFFERENTIAL/PLATELET  LIPASE, BLOOD    Imaging Review Ct Abdomen Pelvis W Contrast  01/13/2016  CLINICAL DATA:  Epigastric pain for 2 or 3 days with nausea and vomiting. History of diabetes and hypertension. EXAM: CT ABDOMEN AND PELVIS WITH CONTRAST TECHNIQUE: Multidetector CT imaging of the abdomen and pelvis was performed using the standard protocol following bolus  administration of intravenous contrast. CONTRAST:  OMNIPAQUE IOHEXOL 300 MG/ML  SOLN COMPARISON:  CT 04/16/2015.  Ultrasound 01/13/2016. FINDINGS: Lower chest: Clear lung bases. No significant pleural or pericardial effusion. Hepatobiliary: The liver demonstrates decreased density consistent with steatosis. No focal abnormalities are identified. The gallbladder is contracted without apparent wall thickening or surrounding inflammation. No evidence of gallstones or biliary dilatation. Pancreas: Unremarkable. No pancreatic ductal dilatation or surrounding inflammatory changes. Spleen: Normal in size without focal abnormality. Adrenals/Urinary Tract: Both adrenal glands appear normal. There are nonobstructing calculi in the lower poles of both kidneys (1 on the right and 2 on the left). There is a 3.1 cm parapelvic cyst in the interpolar region of the left kidney. No evidence of ureteral calculus or hydronephrosis. The bladder appears unremarkable. Stomach/Bowel: No evidence of bowel wall thickening, distention or surrounding inflammatory change. The appendix appears normal. There are  mild sigmoid colon diverticular changes. Vascular/Lymphatic: There are stable small lymph nodes in the porta hepatis. No retroperitoneal or pelvic lymphadenopathy. No significant vascular findings are present. Reproductive: Unremarkable. Other: No evidence of abdominal wall mass or hernia. Musculoskeletal: No acute or significant osseous findings. Stable postsurgical changes status post laminectomy and PLIF at L5-S1. IMPRESSION: 1. No acute findings or explanation for the patient's symptoms. 2. Nonobstructing bilateral renal calculi. 3. Hepatic steatosis, mild sigmoid colon diverticulosis and postsurgical changes in the lumbar spine noted. Electronically Signed   By: Carey Bullocks M.D.   On: 01/13/2016 15:05   US Abdomen Limited  01/13/2016  CLINICAL DATA:  Abdominal pain after eating with abdominal swelling and vomiting for 4  days. Initial encounter. EXAM: US ABDOMEN LIMITED - RIGHT UPPER QUADRANT COMPARISON:  CT abdomen and pelvis 04/16/2015. FINDINGS: Gallbladder: The gallbladder is contracted with associated mild prominence of its wall. No gallstones or pericholecystic fluid identified. Common bile duct: Diameter: 0.5 cm. Liver: Demonstrates increased echogenicity.  No focal lesion. IMPRESSION: Negative for gallstones or acute cholecystitis. Fatty infiltration of the liver. Electronically Signed   By: Drusilla Kanner M.D.   On: 01/13/2016 13:27   I have personally reviewed and evaluated these images and lab results as part of my medical decision-making.   EKG Interpretation   Date/Time:  Wednesday January 13 2016 11:20:27 EST Ventricular Rate:  97 PR Interval:  159 QRS Duration: 82 QT Interval:  360 QTC Calculation: 457 R Axis:   38 Text Interpretation:  Sinus rhythm Atrial premature complex Sinus pause  Confirmed by Elyanah Farino MD, Barbara Cower [phone]) on 01/13/2016 1:33:29 PM      MDM   Final diagnoses:  Abdominal pain, unspecified abdominal location    56 yo M w/ epigastric abdominal pain of uncertain etiology, possibly gas. CT and [place] both negative. Labs noncontributory. Still with minor discomfort but overall improved exam and pain in ED. Doubt pancreatitis, perforated ulcer or hepatobiliary pathology at this time. stble for dc with pcp follow up.   I personally performed the services described in this documentation, which was scribed in my presence. The recorded information has been reviewed and is accurate.   Marily Memos, MD 01/13/16 1610  Marily Memos, MD 01/13/16 [phone]

## 2016-01-13 NOTE — ED Notes (Signed)
CT called and made aware of oral contrast was done

## 2016-01-13 NOTE — ED Notes (Signed)
Patient reports of mid epigastric pain that started last night. States "every time I eat my pain gets worse then I vomit." Reports of feeling short of breath. Describes pain as "feeling like something is going to burst." NAD noted.

## 2016-01-29 ENCOUNTER — Ambulatory Visit: Payer: Self-pay | Admitting: "Endocrinology

## 2016-01-31 ENCOUNTER — Encounter (HOSPITAL_COMMUNITY): Payer: Self-pay | Admitting: *Deleted

## 2016-01-31 ENCOUNTER — Emergency Department (HOSPITAL_COMMUNITY)
Admission: EM | Admit: 2016-01-31 | Discharge: 2016-01-31 | Disposition: A | Discharge status: Home / Self Care | Payer: Medicare Other | Attending: Emergency Medicine | Admitting: Emergency Medicine

## 2016-01-31 ENCOUNTER — Emergency Department (HOSPITAL_COMMUNITY): Payer: Medicare Other

## 2016-01-31 DIAGNOSIS — R110 Nausea: Secondary | ICD-10-CM | POA: Insufficient documentation

## 2016-01-31 DIAGNOSIS — R109 Unspecified abdominal pain: Secondary | ICD-10-CM | POA: Insufficient documentation

## 2016-01-31 DIAGNOSIS — R0789 Other chest pain: Secondary | ICD-10-CM | POA: Insufficient documentation

## 2016-01-31 DIAGNOSIS — M109 Gout, unspecified: Secondary | ICD-10-CM | POA: Diagnosis not present

## 2016-01-31 DIAGNOSIS — R079 Chest pain, unspecified: Secondary | ICD-10-CM | POA: Diagnosis present

## 2016-01-31 DIAGNOSIS — Z87442 Personal history of urinary calculi: Secondary | ICD-10-CM | POA: Diagnosis not present

## 2016-01-31 DIAGNOSIS — Z79899 Other long term (current) drug therapy: Secondary | ICD-10-CM | POA: Insufficient documentation

## 2016-01-31 DIAGNOSIS — Z794 Long term (current) use of insulin: Secondary | ICD-10-CM | POA: Insufficient documentation

## 2016-01-31 DIAGNOSIS — F17210 Nicotine dependence, cigarettes, uncomplicated: Secondary | ICD-10-CM | POA: Diagnosis not present

## 2016-01-31 DIAGNOSIS — I10 Essential (primary) hypertension: Secondary | ICD-10-CM | POA: Diagnosis not present

## 2016-01-31 DIAGNOSIS — G8929 Other chronic pain: Secondary | ICD-10-CM | POA: Diagnosis not present

## 2016-01-31 DIAGNOSIS — E1165 Type 2 diabetes mellitus with hyperglycemia: Secondary | ICD-10-CM | POA: Diagnosis not present

## 2016-01-31 DIAGNOSIS — M7989 Other specified soft tissue disorders: Secondary | ICD-10-CM | POA: Diagnosis not present

## 2016-01-31 DIAGNOSIS — Z8701 Personal history of pneumonia (recurrent): Secondary | ICD-10-CM | POA: Diagnosis not present

## 2016-01-31 DIAGNOSIS — M5136 Other intervertebral disc degeneration, lumbar region: Secondary | ICD-10-CM | POA: Insufficient documentation

## 2016-01-31 DIAGNOSIS — R0602 Shortness of breath: Secondary | ICD-10-CM | POA: Insufficient documentation

## 2016-01-31 DIAGNOSIS — R05 Cough: Secondary | ICD-10-CM | POA: Insufficient documentation

## 2016-01-31 DIAGNOSIS — K219 Gastro-esophageal reflux disease without esophagitis: Secondary | ICD-10-CM | POA: Diagnosis not present

## 2016-01-31 LAB — CBC
HEMATOCRIT: 39.5 % (ref 39.0–52.0)
Hemoglobin: 13.4 g/dL (ref 13.0–17.0)
MCH: 30.2 pg (ref 26.0–34.0)
MCHC: 33.9 g/dL (ref 30.0–36.0)
MCV: 89 fL (ref 78.0–100.0)
PLATELETS: 256 10*3/uL (ref 150–400)
RBC: 4.44 MIL/uL (ref 4.22–5.81)
RDW: 13.5 % (ref 11.5–15.5)
WBC: 10.3 10*3/uL (ref 4.0–10.5)

## 2016-01-31 LAB — BASIC METABOLIC PANEL
Anion gap: 11 (ref 5–15)
BUN: 14 mg/dL (ref 6–20)
CHLORIDE: 96 mmol/L — AB (ref 101–111)
CO2: 25 mmol/L (ref 22–32)
CREATININE: 1.21 mg/dL (ref 0.61–1.24)
Calcium: 8.9 mg/dL (ref 8.9–10.3)
GFR calc Af Amer: >60 mL/min (ref >60)
GFR calc non Af Amer: >60 mL/min (ref >60)
GLUCOSE: 361 mg/dL — AB (ref 65–99)
POTASSIUM: 4.4 mmol/L (ref 3.5–5.1)
SODIUM: 132 mmol/L — AB (ref 135–145)

## 2016-01-31 LAB — TROPONIN I: Troponin I: <0.03 ng/mL (ref <0.031)

## 2016-01-31 LAB — BRAIN NATRIURETIC PEPTIDE: B Natriuretic Peptide: 19 pg/mL (ref 0.0–100.0)

## 2016-01-31 MED ORDER — OXYCODONE-ACETAMINOPHEN 5-325 MG PO TABS
2.0000 | ORAL_TABLET | Freq: Once | ORAL | Status: AC
  Administered 2016-01-31: 2 via ORAL
  Filled 2016-01-31: qty 2

## 2016-01-31 MED ORDER — ALBUTEROL SULFATE (2.5 MG/3ML) 0.083% IN NEBU
5.0000 mg | INHALATION_SOLUTION | Freq: Once | RESPIRATORY_TRACT | Status: AC
  Administered 2016-01-31: 5 mg via RESPIRATORY_TRACT
  Filled 2016-01-31: qty 6

## 2016-01-31 MED ORDER — HYDROGEN PEROXIDE 3 % EX SOLN
CUTANEOUS | Status: AC
  Filled 2016-01-31: qty 473

## 2016-01-31 MED ORDER — ONDANSETRON 8 MG PO TBDP
8.0000 mg | ORAL_TABLET | Freq: Once | ORAL | Status: AC
  Administered 2016-01-31: 8 mg via ORAL
  Filled 2016-01-31: qty 1

## 2016-01-31 MED ORDER — GI COCKTAIL ~~LOC~~
30.0000 mL | Freq: Once | ORAL | Status: AC
  Administered 2016-01-31: 30 mL via ORAL

## 2016-01-31 MED ORDER — ONDANSETRON HCL 8 MG PO TABS: 8.0000 mg | ORAL_TABLET | Freq: Three times a day (TID) | ORAL | Status: DC | PRN

## 2016-01-31 NOTE — ED Notes (Signed)
Pt comes in with chest pain starting yesterday that has increasingly gotten worse. Pt has shortness of breath in addition to CP. Pt was seen here for this around 2 weeks ago and was discharged.

## 2016-01-31 NOTE — ED Notes (Signed)
MD at bedside. 

## 2016-01-31 NOTE — ED Notes (Signed)
Pt made aware to return if symptoms worsen or if any life threatening symptoms occur.   

## 2016-01-31 NOTE — ED Provider Notes (Addendum)
CSN: [id number]     Arrival date & time 01/31/16  4098 History  By signing my name below, I, Thomas Donovan, attest that this documentation has been prepared under the direction and in the presence of Doug Sou, MD. Electronically Signed: Doreatha Donovan, ED Scribe. 01/31/2016. 9:47 AM.     Chief Complaint  Patient presents with  . Chest Pain   The history is provided by the patient. No language interpreter was used.    HPI Comments: Thomas Donovan is a 55 y.o. male with h/o GERD, DM, HTN, chronic back pain on Percocet, gout who presents to the Emergency Department complaining of moderate, constant nonradiating substernal CP and epigastric abdominal pain onset a month ago and worsened 2 days ago. Pain is worse with cough or deep inspiration, not improved by anything. He states associated dry cough, SOB, lateral leg swelling onset yesterday. Pt states that deep breathing and coughing worsens his pain. Pt notes his pain is unaffected by eating. He has taken Percocet with no relief of pain. He also notes that he sleeps with one pillow. Pt was seen in the ED for similar symptoms on 01/13/16 and his work up was negative. Pt is a current every day smoker for approximately 40 years. He states he drinks a case of beer per week. He denies illicit drug use. PCP is Dr. Margo Common. Pt is followed by pain management for chronic back pain. FHx of stroke. No FHx of MI. He denies fever.   Past Medical History  Diagnosis Date  . Degeneration of lumbar intervertebral disc   . Diabetes mellitus   . Gout   . Hypertension   . Chronic back pain   . Cold     recent rx  . History of kidney stones   . GERD (gastroesophageal reflux disease)   . Pneumonia    adult onset diabetes Past Surgical History  Procedure Laterality Date  . Tonsillectomy    . Back surgery    . Maximum access (mas)posterior lumbar interbody fusion (plif) 1 level  11/20/2013    Procedure: FOR MAXIMUM ACCESS (MAS) POSTERIOR LUMBAR INTERBODY FUSION  LUMBAR FIVE-SACRAL ONE;  Surgeon: Tia Alert, MD;  Location: [facility];  Service: Neurosurgery;;  FOR MAXIMUM ACCESS (MAS) POSTERIOR LUMBAR INTERBODY FUSION LUMBAR FIVE-SACRAL ONE   No family history on file. Social History  Substance Use Topics  . Smoking status: Current Every Day Smoker -- 0.50 packs/day for 10 years    Types: Cigarettes  . Smokeless tobacco: Never Used  . Alcohol Use: 7.2 oz/week    12 Cans of beer per week     Comment: every 2 or 3 days    Review of Systems  Constitutional: Negative.  Negative for fever.  HENT: Negative.   Respiratory: Positive for cough and shortness of breath.   Cardiovascular: Positive for chest pain and leg swelling.  Gastrointestinal: Positive for nausea and abdominal pain. Negative for vomiting.  Musculoskeletal: Negative.   Skin: Negative.   Allergic/Immunologic: Positive for immunocompromised state.       Diabetic  Neurological: Negative.   Psychiatric/Behavioral: Negative.   All other systems reviewed and are negative.  Allergies  Ibuprofen and Toradol  Home Medications   Prior to Admission medications   Medication Sig Start Date End Date Taking? Authorizing Provider  Carboxymethylcellul-Glycerin (CLEAR EYES FOR DRY EYES OP) Apply 1 drop to eye daily as needed (for itching and irritation).    Historical Provider, MD  clotrimazole (LOTRIMIN) 1 % cream Apply  to affected area 2 times daily Patient not taking: Reported on 01/13/2016 12/03/15   [name], MD  colchicine 0.6 MG tablet Take 0.6 mg by mouth daily as needed (gout flare).     Historical Provider, MD  colchicine-probenecid 0.5-500 MG tablet Take 1 tablet by mouth daily. Patient not taking: Reported on 01/13/2016 12/20/15   Tammy Triplett, PA-C  gabapentin (NEURONTIN) 300 MG capsule TAKE ONE CAPSULE 3 TIMES A DAY 07/20/15   Historical Provider, MD  insulin NPH Human (HUMULIN N,NOVOLIN N) 100 UNIT/ML injection Inject 30 Units into the skin 2 (two) times daily.      Historical Provider, MD  lisinopril-hydrochlorothiazide (PRINZIDE,ZESTORETIC) 20-25 MG per tablet Take 1.5 tablets by mouth daily.  12/10/14   Historical Provider, MD  metroNIDAZOLE (FLAGYL) 500 MG tablet Take 1 tablet (500 mg total) by mouth 2 (two) times daily. Patient not taking: Reported on 01/13/2016 12/03/15   [name], MD  omeprazole (PRILOSEC) 20 MG capsule Take 1 capsule by mouth 2 (two) times daily. 12/06/14   Historical Provider, MD  oxyCODONE-acetaminophen (PERCOCET) 10-325 MG tablet Take 1 tablet by mouth every 6 (six) hours as needed for pain.  01/09/16   Historical Provider, MD   BP 169/86 mmHg  Pulse 95  Temp(Src) 98.3 F (36.8 C) (Oral)  Resp 22  Ht 6' (1.829 m)  Wt 280 lb (127.007 kg)  BMI 37.97 kg/m2  SpO2 98% Physical Exam  Constitutional: He appears well-developed and well-nourished.  HENT:  Head: Normocephalic and atraumatic.  Eyes: Conjunctivae are normal. Pupils are equal, round, and reactive to light.  Neck: Neck supple. No tracheal deviation present. No thyromegaly present.  Cardiovascular: Normal rate and regular rhythm.   No murmur heard. Pulmonary/Chest: Effort normal and breath sounds normal.  Abdominal: Soft. Bowel sounds are normal. He exhibits no distension. There is no tenderness.  Obese  Musculoskeletal: Normal range of motion. He exhibits no edema or tenderness.  Neurological: He is alert. Coordination normal.  Skin: Skin is warm and dry. No rash noted.  Psychiatric: He has a normal mood and affect.  Nursing note and vitals reviewed.   ED Course  Procedures (including critical care time) DIAGNOSTIC STUDIES: Oxygen Saturation is 98% on RA, normal by my interpretation.    COORDINATION OF CARE: 9:44 AM Discussed treatment plan with pt at bedside which includes lab work, CXR and pt agreed to plan.   Labs Review Labs Reviewed  BASIC METABOLIC PANEL  CBC  TROPONIN I  BRAIN NATRIURETIC PEPTIDE    Imaging Review No results found. I  have personally reviewed and evaluated these images and lab results as part of my medical decision-making.   EKG Interpretation   Date/Time:  Sunday January 31 2016 09:32:52 EST Ventricular Rate:  92 PR Interval:  152 QRS Duration: 90 QT Interval:  377 QTC Calculation: 466 R Axis:   60 Text Interpretation:  Sinus rhythm Ventricular premature complex Baseline  wander in lead(s) V1 V2 V3 No significant change since last tracing  Confirmed by Ethelda Chick  MD, Kanden Carey [phone]) on 01/31/2016 9:35:52 AM     chest xray viewed by me. Results for orders placed or performed during the hospital encounter of 01/31/16  Basic metabolic panel  Result Value Ref Range   Sodium 132 (L) 135 - 145 mmol/L   Potassium 4.4 3.5 - 5.1 mmol/L   Chloride 96 (L) 101 - 111 mmol/L   CO2 25 22 - 32 mmol/L   Glucose, Bld 361 (H) 65 - 99 mg/dL  BUN 14 6 - 20 mg/dL   Creatinine, Ser 1.61 0.61 - 1.24 mg/dL   Calcium 8.9 8.9 - 09.6 mg/dL   GFR calc non Af Amer >60 >60 mL/min   GFR calc Af Amer >60 >60 mL/min   Anion gap 11 5 - 15  CBC  Result Value Ref Range   WBC 10.3 4.0 - 10.5 K/uL   RBC 4.44 4.22 - 5.81 MIL/uL   Hemoglobin 13.4 13.0 - 17.0 g/dL   HCT 04.5 40.9 - 81.1 %   MCV 89.0 78.0 - 100.0 fL   MCH 30.2 26.0 - 34.0 pg   MCHC 33.9 30.0 - 36.0 g/dL   RDW 91.4 78.2 - 95.6 %   Platelets 256 150 - 400 K/uL  Troponin I  Result Value Ref Range   Troponin I <0.03 <0.031 ng/mL  Brain natriuretic peptide  Result Value Ref Range   B Natriuretic Peptide 19.0 0.0 - 100.0 pg/mL   Dg Chest 2 View  01/31/2016  CLINICAL DATA:  Chest pain, increasing short of breath EXAM: CHEST  2 VIEW COMPARISON:  09/30/2015 FINDINGS: Normal mediastinum and cardiac silhouette. Normal pulmonary vasculature. No evidence of effusion, infiltrate, or pneumothorax. No acute bony abnormality. Degenerative osteophytosis of the thoracic spine. IMPRESSION: No acute cardiopulmonary process. Electronically Signed   By: Genevive Bi M.D.   On:  01/31/2016 10:49   Ct Abdomen Pelvis W Contrast  01/13/2016  CLINICAL DATA:  Epigastric pain for 2 or 3 days with nausea and vomiting. History of diabetes and hypertension. EXAM: CT ABDOMEN AND PELVIS WITH CONTRAST TECHNIQUE: Multidetector CT imaging of the abdomen and pelvis was performed using the standard protocol following bolus administration of intravenous contrast. CONTRAST:  OMNIPAQUE IOHEXOL 300 MG/ML  SOLN COMPARISON:  CT 04/16/2015.  Ultrasound 01/13/2016. FINDINGS: Lower chest: Clear lung bases. No significant pleural or pericardial effusion. Hepatobiliary: The liver demonstrates decreased density consistent with steatosis. No focal abnormalities are identified. The gallbladder is contracted without apparent wall thickening or surrounding inflammation. No evidence of gallstones or biliary dilatation. Pancreas: Unremarkable. No pancreatic ductal dilatation or surrounding inflammatory changes. Spleen: Normal in size without focal abnormality. Adrenals/Urinary Tract: Both adrenal glands appear normal. There are nonobstructing calculi in the lower poles of both kidneys (1 on the right and 2 on the left). There is a 3.1 cm parapelvic cyst in the interpolar region of the left kidney. No evidence of ureteral calculus or hydronephrosis. The bladder appears unremarkable. Stomach/Bowel: No evidence of bowel wall thickening, distention or surrounding inflammatory change. The appendix appears normal. There are mild sigmoid colon diverticular changes. Vascular/Lymphatic: There are stable small lymph nodes in the porta hepatis. No retroperitoneal or pelvic lymphadenopathy. No significant vascular findings are present. Reproductive: Unremarkable. Other: No evidence of abdominal wall mass or hernia. Musculoskeletal: No acute or significant osseous findings. Stable postsurgical changes status post laminectomy and PLIF at L5-S1. IMPRESSION: 1. No acute findings or explanation for the patient's symptoms. 2.  Nonobstructing bilateral renal calculi. 3. Hepatic steatosis, mild sigmoid colon diverticulosis and postsurgical changes in the lumbar spine noted. Electronically Signed   By: Carey Bullocks M.D.   On: 01/13/2016 15:05   US Abdomen Limited  01/13/2016  CLINICAL DATA:  Abdominal pain after eating with abdominal swelling and vomiting for 4 days. Initial encounter. EXAM: US ABDOMEN LIMITED - RIGHT UPPER QUADRANT COMPARISON:  CT abdomen and pelvis 04/16/2015. FINDINGS: Gallbladder: The gallbladder is contracted with associated mild prominence of its wall. No gallstones or pericholecystic fluid identified.  Common bile duct: Diameter: 0.5 cm. Liver: Demonstrates increased echogenicity.  No focal lesion. IMPRESSION: Negative for gallstones or acute cholecystitis. Fatty infiltration of the liver. Electronically Signed   By: Drusilla Kanner M.D.   On: 01/13/2016 13:27    11:50 AM patient feels much improved and ready to go home after treatment with oxycodone, GI cocktail, and Zofran MDM  Patient's blood sugar has been running high for "months" Dr Margo Common, his primary care physician has referred him to a diabetic specialist with whom he has an appointment on 02/04/2016. Final diagnoses:  None   heart score equals 3, atypical story, normal EKG. Several risk factors. All symptoms are chronic. Patient is concerned that he may have cancer. I've advised him that we are unable to check for every type of cancer in the ED. Than prescription Zofran. He has oxycodone at home which she takes for chronic pain I counseled patient for 5 minutes on smoking cessation and advised him to cut back on alcohol Diagnoses #1 atypical chest pain Diagnosis #2 hyperglycemia #3 tobacco abuse     I personally performed the services described in this documentation, which was scribed in my presence. The recorded information has been reviewed and considered.  Doug Sou, MD 01/31/16 1200  Doug Sou, MD 01/31/16 1201

## 2016-01-31 NOTE — Discharge Instructions (Signed)
Nonspecific Chest Pain  Take your oxycodone as needed for pain. Take the medicine prescribed today as needed for nausea. Your blood sugar today was 361, which is high. Keep your appointment with your diabetic specialist on 02/04/2016. Call Dr. Margo Common tomorrow. Ask him to help you to stop smoking and to help you take cut back on alcohol intake. Chest pain can be caused by many different conditions. There is always a chance that your pain could be related to something serious, such as a heart attack or a blood clot in your lungs. Chest pain can also be caused by conditions that are not life-threatening. If you have chest pain, it is very important to follow up with your health care provider. CAUSES  Chest pain can be caused by:  Heartburn.  Pneumonia or bronchitis.  Anxiety or stress.  Inflammation around your heart (pericarditis) or lung (pleuritis or pleurisy).  A blood clot in your lung.  A collapsed lung (pneumothorax). It can develop suddenly on its own (spontaneous pneumothorax) or from trauma to the chest.  Shingles infection (varicella-zoster virus).  Heart attack.  Damage to the bones, muscles, and cartilage that make up your chest wall. This can include:  Bruised bones due to injury.  Strained muscles or cartilage due to frequent or repeated coughing or overwork.  Fracture to one or more ribs.  Sore cartilage due to inflammation (costochondritis). RISK FACTORS  Risk factors for chest pain may include:  Activities that increase your risk for trauma or injury to your chest.  Respiratory infections or conditions that cause frequent coughing.  Medical conditions or overeating that can cause heartburn.  Heart disease or family history of heart disease.  Conditions or health behaviors that increase your risk of developing a blood clot.  Having had chicken pox (varicella zoster). SIGNS AND SYMPTOMS Chest pain can feel like:  Burning or tingling on the surface of your  chest or deep in your chest.  Crushing, pressure, aching, or squeezing pain.  Dull or sharp pain that is worse when you move, cough, or take a deep breath.  Pain that is also felt in your back, neck, shoulder, or arm, or pain that spreads to any of these areas. Your chest pain may come and go, or it may stay constant. DIAGNOSIS Lab tests or other studies may be needed to find the cause of your pain. Your health care provider may have you take a test called an ambulatory ECG (electrocardiogram). An ECG records your heartbeat patterns at the time the test is performed. You may also have other tests, such as:  Transthoracic echocardiogram (TTE). During echocardiography, sound waves are used to create a picture of all of the heart structures and to look at how blood flows through your heart.  Transesophageal echocardiogram (TEE).This is a more advanced imaging test that obtains images from inside your body. It allows your health care provider to see your heart in finer detail.  Cardiac monitoring. This allows your health care provider to monitor your heart rate and rhythm in real time.  Holter monitor. This is a portable device that records your heartbeat and can help to diagnose abnormal heartbeats. It allows your health care provider to track your heart activity for several days, if needed.  Stress tests. These can be done through exercise or by taking medicine that makes your heart beat more quickly.  Blood tests.  Imaging tests. TREATMENT  Your treatment depends on what is causing your chest pain. Treatment may include:  Medicines.  These may include:  Acid blockers for heartburn.  Anti-inflammatory medicine.  Pain medicine for inflammatory conditions.  Antibiotic medicine, if an infection is present.  Medicines to dissolve blood clots.  Medicines to treat coronary artery disease.  Supportive care for conditions that do not require medicines. This may  include:  Resting.  Applying heat or cold packs to injured areas.  Limiting activities until pain decreases. HOME CARE INSTRUCTIONS  If you were prescribed an antibiotic medicine, finish it all even if you start to feel better.  Avoid any activities that bring on chest pain.  Do not use any tobacco products, including cigarettes, chewing tobacco, or electronic cigarettes. If you need help quitting, ask your health care provider.  Do not drink alcohol.  Take medicines only as directed by your health care provider.  Keep all follow-up visits as directed by your health care provider. This is important. This includes any further testing if your chest pain does not go away.  If heartburn is the cause for your chest pain, you may be told to keep your head raised (elevated) while sleeping. This reduces the chance that acid will go from your stomach into your esophagus.  Make lifestyle changes as directed by your health care provider. These may include:  Getting regular exercise. Ask your health care provider to suggest some activities that are safe for you.  Eating a heart-healthy diet. A registered dietitian can help you to learn healthy eating options.  Maintaining a healthy weight.  Managing diabetes, if necessary.  Reducing stress. SEEK MEDICAL CARE IF:  Your chest pain does not go away after treatment.  You have a rash with blisters on your chest.  You have a fever. SEEK IMMEDIATE MEDICAL CARE IF:   Your chest pain is worse.  You have an increasing cough, or you cough up blood.  You have severe abdominal pain.  You have severe weakness.  You faint.  You have chills.  You have sudden, unexplained chest discomfort.  You have sudden, unexplained discomfort in your arms, back, neck, or jaw.  You have shortness of breath at any time.  You suddenly start to sweat, or your skin gets clammy.  You feel nauseous or you vomit.  You suddenly feel light-headed or  dizzy.  Your heart begins to beat quickly, or it feels like it is skipping beats. These symptoms may represent a serious problem that is an emergency. Do not wait to see if the symptoms will go away. Get medical help right away. Call your local emergency services (911 in the [place] Do not drive yourself to the hospital.   This information is not intended to replace advice given to you by your health care provider. Make sure you discuss any questions you have with your health care provider.   Document Released: 09/21/2005 Document Revised: 01/02/2015 Document Reviewed: 07/18/2014 Elsevier Interactive Patient Education [employer].

## 2016-02-09 ENCOUNTER — Encounter (HOSPITAL_COMMUNITY): Payer: Self-pay | Admitting: Emergency Medicine

## 2016-02-09 ENCOUNTER — Emergency Department (HOSPITAL_COMMUNITY)
Admission: EM | Admit: 2016-02-09 | Discharge: 2016-02-09 | Discharge status: Against Medical Advice | Payer: Medicare Other | Attending: Emergency Medicine | Admitting: Emergency Medicine

## 2016-02-09 DIAGNOSIS — R109 Unspecified abdominal pain: Secondary | ICD-10-CM | POA: Diagnosis not present

## 2016-02-09 DIAGNOSIS — F17210 Nicotine dependence, cigarettes, uncomplicated: Secondary | ICD-10-CM | POA: Insufficient documentation

## 2016-02-09 DIAGNOSIS — E119 Type 2 diabetes mellitus without complications: Secondary | ICD-10-CM | POA: Diagnosis not present

## 2016-02-09 DIAGNOSIS — Z79899 Other long term (current) drug therapy: Secondary | ICD-10-CM | POA: Diagnosis not present

## 2016-02-09 DIAGNOSIS — R0602 Shortness of breath: Secondary | ICD-10-CM | POA: Diagnosis not present

## 2016-02-09 DIAGNOSIS — K219 Gastro-esophageal reflux disease without esophagitis: Secondary | ICD-10-CM | POA: Diagnosis not present

## 2016-02-09 DIAGNOSIS — Z8701 Personal history of pneumonia (recurrent): Secondary | ICD-10-CM | POA: Diagnosis not present

## 2016-02-09 DIAGNOSIS — Z794 Long term (current) use of insulin: Secondary | ICD-10-CM | POA: Diagnosis not present

## 2016-02-09 DIAGNOSIS — R1110 Vomiting, unspecified: Secondary | ICD-10-CM | POA: Diagnosis not present

## 2016-02-09 DIAGNOSIS — M109 Gout, unspecified: Secondary | ICD-10-CM | POA: Diagnosis not present

## 2016-02-09 DIAGNOSIS — G8929 Other chronic pain: Secondary | ICD-10-CM | POA: Insufficient documentation

## 2016-02-09 DIAGNOSIS — R079 Chest pain, unspecified: Secondary | ICD-10-CM

## 2016-02-09 DIAGNOSIS — Z87442 Personal history of urinary calculi: Secondary | ICD-10-CM | POA: Diagnosis not present

## 2016-02-09 DIAGNOSIS — I10 Essential (primary) hypertension: Secondary | ICD-10-CM | POA: Diagnosis not present

## 2016-02-09 LAB — TROPONIN I: Troponin I: <0.03 ng/mL (ref <0.031)

## 2016-02-09 LAB — BASIC METABOLIC PANEL
ANION GAP: 10 (ref 5–15)
BUN: 13 mg/dL (ref 6–20)
CO2: 26 mmol/L (ref 22–32)
Calcium: 8.8 mg/dL — ABNORMAL LOW (ref 8.9–10.3)
Chloride: 97 mmol/L — ABNORMAL LOW (ref 101–111)
Creatinine, Ser: 0.95 mg/dL (ref 0.61–1.24)
Glucose, Bld: 248 mg/dL — ABNORMAL HIGH (ref 65–99)
POTASSIUM: 3.4 mmol/L — AB (ref 3.5–5.1)
SODIUM: 133 mmol/L — AB (ref 135–145)

## 2016-02-09 LAB — CBC WITH DIFFERENTIAL/PLATELET
BASOS ABS: 0 10*3/uL (ref 0.0–0.1)
Basophils Relative: 0 %
EOS PCT: 3 %
Eosinophils Absolute: 0.3 10*3/uL (ref 0.0–0.7)
HCT: 38.6 % — ABNORMAL LOW (ref 39.0–52.0)
Hemoglobin: 12.9 g/dL — ABNORMAL LOW (ref 13.0–17.0)
LYMPHS PCT: 23 %
Lymphs Abs: 2.3 10*3/uL (ref 0.7–4.0)
MCH: 29.4 pg (ref 26.0–34.0)
MCHC: 33.4 g/dL (ref 30.0–36.0)
MCV: 87.9 fL (ref 78.0–100.0)
Monocytes Absolute: 0.8 10*3/uL (ref 0.1–1.0)
Monocytes Relative: 7 %
NEUTROS ABS: 7 10*3/uL (ref 1.7–7.7)
Neutrophils Relative %: 67 %
PLATELETS: 268 10*3/uL (ref 150–400)
RBC: 4.39 MIL/uL (ref 4.22–5.81)
RDW: 13.7 % (ref 11.5–15.5)
WBC: 10.4 10*3/uL (ref 4.0–10.5)

## 2016-02-09 MED ORDER — GI COCKTAIL ~~LOC~~
30.0000 mL | Freq: Once | ORAL | Status: AC
  Administered 2016-02-09: 30 mL via ORAL
  Filled 2016-02-09: qty 30

## 2016-02-09 MED ORDER — PANTOPRAZOLE SODIUM 20 MG PO TBEC: 20.0000 mg | DELAYED_RELEASE_TABLET | Freq: Every day | ORAL | Status: DC

## 2016-02-09 NOTE — ED Notes (Signed)
PT c/o epigastric burning with increased gas unrelieved by Prilosec and zantac. PT c/o emesis (small amount) x3 times.

## 2016-02-09 NOTE — ED Provider Notes (Signed)
CSN: [id number]     Arrival date & time 02/09/16  4098 History  By signing my name below, I, Ronney Lion, attest that this documentation has been prepared under the direction and in the presence of Zadie Rhine, MD. Electronically Signed: Ronney Lion, ED Scribe. 02/09/2016. 8:41 AM.   Chief Complaint  Patient presents with  . Abdominal Pain   Patient is a 55 y.o. male presenting with chest pain. The history is provided by the patient. No language interpreter was used.  Chest Pain Pain location:  Substernal area Pain quality: burning   Pain radiates to:  Does not radiate Pain radiates to the back: no   Pain severity:  Moderate Duration:  8 hours Timing:  Constant Chronicity:  Recurrent Context: eating   Relieved by:  None tried Worsened by:  Nothing tried Ineffective treatments:  None tried Associated symptoms: abdominal pain, shortness of breath and vomiting   Associated symptoms: no cough, no diaphoresis and no lower extremity edema    HPI Comments: Thomas Donovan is a 54 y.o. male with a history of DM, smoking, HTN, chronic back pain, and GERD, who presents to the Emergency Department complaining of recurrent, non-radiating, burning, constant, mid-sternal chest pain that began last night at 11:30 PM, about 30 minutes after eating dinner. He characterizes the pain as like "real bad acid reflux." Patient states last night, he had been belching repeatedly, interfering with sleep. He had vomited once, and notes associated SOB. Patient states this same problem had occurred multiple times in the past month, for which he has been to the ED, although this episode was most severe. Walking does not affect his pain. He notes a history of smoking. He denies hematemesis, abdominal pain, diaphoresis, leg swelling, or cough.   PCP: Louie Boston, MD   Past Medical History  Diagnosis Date  . Degeneration of lumbar intervertebral disc   . Diabetes mellitus   . Gout   . Hypertension   .  Chronic back pain   . Cold     recent rx  . History of kidney stones   . GERD (gastroesophageal reflux disease)   . Pneumonia    Past Surgical History  Procedure Laterality Date  . Tonsillectomy    . Back surgery    . Maximum access (mas)posterior lumbar interbody fusion (plif) 1 level  11/20/2013    Procedure: FOR MAXIMUM ACCESS (MAS) POSTERIOR LUMBAR INTERBODY FUSION LUMBAR FIVE-SACRAL ONE;  Surgeon: Tia Alert, MD;  Location: [facility];  Service: Neurosurgery;;  FOR MAXIMUM ACCESS (MAS) POSTERIOR LUMBAR INTERBODY FUSION LUMBAR FIVE-SACRAL ONE   No family history on file. Social History  Substance Use Topics  . Smoking status: Current Every Day Smoker -- 0.50 packs/day for 10 years    Types: Cigarettes  . Smokeless tobacco: Never Used  . Alcohol Use: 7.2 oz/week    12 Cans of beer per week     Comment: every 2 or 3 days    Review of Systems  Constitutional: Negative for diaphoresis.  Respiratory: Positive for shortness of breath. Negative for cough.   Cardiovascular: Positive for chest pain.  Gastrointestinal: Positive for vomiting and abdominal pain.  All other systems reviewed and are negative.     Allergies  Ibuprofen and Toradol  Home Medications   Prior to Admission medications   Medication Sig Start Date End Date Taking? Authorizing Provider  Carboxymethylcellul-Glycerin (CLEAR EYES FOR DRY EYES OP) Apply 1 drop to eye daily as needed (for itching and irritation).  Historical Provider, MD  clotrimazole (LOTRIMIN) 1 % cream Apply to affected area 2 times daily Patient not taking: Reported on 01/13/2016 12/03/15   [name], MD  colchicine 0.6 MG tablet Take 0.6 mg by mouth daily as needed (gout flare).     Historical Provider, MD  colchicine-probenecid 0.5-500 MG tablet Take 1 tablet by mouth daily. Patient not taking: Reported on 01/13/2016 12/20/15   Tammy Triplett, PA-C  gabapentin (NEURONTIN) 300 MG capsule TAKE ONE CAPSULE 3 TIMES A DAY 07/20/15    Historical Provider, MD  insulin NPH Human (HUMULIN N,NOVOLIN N) 100 UNIT/ML injection Inject 30 Units into the skin 2 (two) times daily.     Historical Provider, MD  lisinopril-hydrochlorothiazide (PRINZIDE,ZESTORETIC) 20-25 MG per tablet Take 1.5 tablets by mouth daily.  12/10/14   Historical Provider, MD  metroNIDAZOLE (FLAGYL) 500 MG tablet Take 1 tablet (500 mg total) by mouth 2 (two) times daily. Patient not taking: Reported on 01/13/2016 12/03/15   [name], MD  omeprazole (PRILOSEC) 20 MG capsule Take 1 capsule by mouth 2 (two) times daily. 12/06/14   Historical Provider, MD  ondansetron (ZOFRAN) 8 MG tablet Take 1 tablet (8 mg total) by mouth every 8 (eight) hours as needed for nausea. 01/31/16   Doug Sou, MD  oxyCODONE-acetaminophen (PERCOCET) 10-325 MG tablet Take 1 tablet by mouth every 6 (six) hours as needed for pain.  01/09/16   Historical Provider, MD   BP 136/86 mmHg  Pulse 103  Temp(Src) 98.7 F (37.1 C) (Oral)  Resp 18  Ht 6' (1.829 m)  Wt 280 lb (127.007 kg)  BMI 37.97 kg/m2  SpO2 96% Physical Exam  Nursing note and vitals reviewed. CONSTITUTIONAL: Well developed/well nourished HEAD: Normocephalic/atraumatic EYES: EOMI/PERRL ENMT: Mucous membranes moist NECK: supple no meningeal signs SPINE/BACK:entire spine nontender CV: S1/S2 noted, no murmurs/rubs/gallops noted LUNGS: Lungs are clear to auscultation bilaterally, no apparent distress ABDOMEN: soft, nontender, no rebound or guarding, bowel sounds noted throughout abdomen GU:no cva tenderness NEURO: Pt is awake/alert/appropriate, moves all extremitiesx4.  No facial droop.   EXTREMITIES: pulses normal/equal, full ROM SKIN: warm, color normal PSYCH: no abnormalities of mood noted, alert and oriented to situation  ED Course  Procedures  Medications  gi cocktail (Maalox,Lidocaine,Donnatal) (30 mLs Oral Given 02/09/16 0842)     DIAGNOSTIC STUDIES: Oxygen Saturation is 98% on RA, normal by my  interpretation.    COORDINATION OF CARE: 8:28 AM - Discussed treatment plan with pt at bedside which includes GI cocktail and possible hospital admission. Pt verbalized understanding and agreed to plan.  Pt refuses to take ASA due to concern for causing abdominal pain and possible interaction with gout medication  Defer CXR as he had normal CXR on 01/31/16  I advised admission due to risk factors and having recurrent CP and needs full cardiac evaluation as an inpatient He reports he feels improved and wants to be discharged We discussed risks of leaving the ED He accepts risks  I discussed risk of death/disability of leaving against medical advice and the patient accepts these risks.  The patient is awake/alert able to make decisions, and does not appear intoxicated Patient discharged against medical advice.   Labs Review Labs Reviewed  BASIC METABOLIC PANEL - Abnormal; Notable for the following:    Sodium 133 (*)    Potassium 3.4 (*)    Chloride 97 (*)    Glucose, Bld 248 (*)    Calcium 8.8 (*)    All other components within normal limits  CBC WITH DIFFERENTIAL/PLATELET - Abnormal; Notable for the following:    Hemoglobin 12.9 (*)    HCT 38.6 (*)    All other components within normal limits  TROPONIN I    I have personally reviewed and evaluated these lab results as part of my medical decision-making.   EKG Interpretation   Date/Time:  Tuesday February 09 2016 08:26:33 EST Ventricular Rate:  101 PR Interval:  154 QRS Duration: 93 QT Interval:  355 QTC Calculation: 460 R Axis:   43 Text Interpretation:  Sinus tachycardia Multiple premature complexes, vent  & supraven Low voltage, precordial leads No significant change since last  tracing Confirmed by Bebe Shaggy  MD, Dorinda Hill ([id number]) on 02/09/2016 8:46:36 AM      MDM   Final diagnoses:  Chest pain, unspecified chest pain type    Nursing notes including past medical history and social history reviewed and considered in  documentation Labs/vital reviewed myself and considered during evaluation   I personally performed the services described in this documentation, which was scribed in my presence. The recorded information has been reviewed and is accurate.       Zadie Rhine, MD 02/09/16 1118

## 2016-02-19 ENCOUNTER — Emergency Department (HOSPITAL_COMMUNITY)
Admission: EM | Admit: 2016-02-19 | Discharge: 2016-02-19 | Disposition: A | Discharge status: Home / Self Care | Payer: Medicare Other | Attending: Emergency Medicine | Admitting: Emergency Medicine

## 2016-02-19 ENCOUNTER — Encounter (HOSPITAL_COMMUNITY): Payer: Self-pay

## 2016-02-19 ENCOUNTER — Emergency Department (HOSPITAL_COMMUNITY): Payer: Medicare Other

## 2016-02-19 DIAGNOSIS — Z9049 Acquired absence of other specified parts of digestive tract: Secondary | ICD-10-CM | POA: Diagnosis not present

## 2016-02-19 DIAGNOSIS — Z8709 Personal history of other diseases of the respiratory system: Secondary | ICD-10-CM | POA: Insufficient documentation

## 2016-02-19 DIAGNOSIS — F1020 Alcohol dependence, uncomplicated: Secondary | ICD-10-CM | POA: Insufficient documentation

## 2016-02-19 DIAGNOSIS — Z794 Long term (current) use of insulin: Secondary | ICD-10-CM | POA: Diagnosis not present

## 2016-02-19 DIAGNOSIS — R0602 Shortness of breath: Secondary | ICD-10-CM | POA: Insufficient documentation

## 2016-02-19 DIAGNOSIS — E119 Type 2 diabetes mellitus without complications: Secondary | ICD-10-CM | POA: Diagnosis not present

## 2016-02-19 DIAGNOSIS — I10 Essential (primary) hypertension: Secondary | ICD-10-CM | POA: Diagnosis not present

## 2016-02-19 DIAGNOSIS — Z8739 Personal history of other diseases of the musculoskeletal system and connective tissue: Secondary | ICD-10-CM | POA: Diagnosis not present

## 2016-02-19 DIAGNOSIS — R42 Dizziness and giddiness: Secondary | ICD-10-CM | POA: Insufficient documentation

## 2016-02-19 DIAGNOSIS — R0789 Other chest pain: Secondary | ICD-10-CM | POA: Insufficient documentation

## 2016-02-19 DIAGNOSIS — Z87442 Personal history of urinary calculi: Secondary | ICD-10-CM | POA: Insufficient documentation

## 2016-02-19 DIAGNOSIS — Z8719 Personal history of other diseases of the digestive system: Secondary | ICD-10-CM | POA: Insufficient documentation

## 2016-02-19 DIAGNOSIS — F17210 Nicotine dependence, cigarettes, uncomplicated: Secondary | ICD-10-CM | POA: Insufficient documentation

## 2016-02-19 DIAGNOSIS — Z79899 Other long term (current) drug therapy: Secondary | ICD-10-CM | POA: Insufficient documentation

## 2016-02-19 LAB — CBC WITH DIFFERENTIAL/PLATELET
Basophils Absolute: 0 10*3/uL (ref 0.0–0.1)
Basophils Relative: 0 %
Eosinophils Absolute: 0.2 10*3/uL (ref 0.0–0.7)
Eosinophils Relative: 3 %
HEMATOCRIT: 39.8 % (ref 39.0–52.0)
HEMOGLOBIN: 13 g/dL (ref 13.0–17.0)
LYMPHS ABS: 1.9 10*3/uL (ref 0.7–4.0)
LYMPHS PCT: 26 %
MCH: 29.2 pg (ref 26.0–34.0)
MCHC: 32.7 g/dL (ref 30.0–36.0)
MCV: 89.4 fL (ref 78.0–100.0)
MONO ABS: 0.5 10*3/uL (ref 0.1–1.0)
MONOS PCT: 7 %
NEUTROS ABS: 4.9 10*3/uL (ref 1.7–7.7)
Neutrophils Relative %: 64 %
Platelets: 260 10*3/uL (ref 150–400)
RBC: 4.45 MIL/uL (ref 4.22–5.81)
RDW: 13.9 % (ref 11.5–15.5)
WBC: 7.6 10*3/uL (ref 4.0–10.5)

## 2016-02-19 LAB — BASIC METABOLIC PANEL
ANION GAP: 8 (ref 5–15)
BUN: 12 mg/dL (ref 6–20)
CALCIUM: 8.4 mg/dL — AB (ref 8.9–10.3)
CHLORIDE: 104 mmol/L (ref 101–111)
CO2: 26 mmol/L (ref 22–32)
CREATININE: 0.84 mg/dL (ref 0.61–1.24)
GFR calc non Af Amer: >60 mL/min (ref >60)
GLUCOSE: 259 mg/dL — AB (ref 65–99)
Potassium: 4 mmol/L (ref 3.5–5.1)
Sodium: 138 mmol/L (ref 135–145)

## 2016-02-19 LAB — D-DIMER, QUANTITATIVE (NOT AT ARMC): D DIMER QUANT: 0.34 ug{FEU}/mL (ref 0.00–0.50)

## 2016-02-19 LAB — TROPONIN I: Troponin I: <0.03 ng/mL (ref <0.031)

## 2016-02-19 MED ORDER — OXYCODONE-ACETAMINOPHEN 5-325 MG PO TABS
2.0000 | ORAL_TABLET | Freq: Once | ORAL | Status: AC
  Administered 2016-02-19: 2 via ORAL
  Filled 2016-02-19: qty 2

## 2016-02-19 MED ORDER — MORPHINE SULFATE (PF) 4 MG/ML IV SOLN
4.0000 mg | Freq: Once | INTRAVENOUS | Status: AC
  Administered 2016-02-19: 4 mg via INTRAVENOUS
  Filled 2016-02-19: qty 1

## 2016-02-19 NOTE — ED Notes (Signed)
Pt reports woke up with left sided chest pain and sob at 1am this morning.  Reports had chest pain last week and was advised to be admitted to the hospital but pt refused because he had things he had to do.  Pt says pain came back this morning worse than last week.

## 2016-02-19 NOTE — ED Provider Notes (Signed)
CSN: [id number]     Arrival date & time 02/19/16  2130 History  By signing my name below, I, Marica Otter, attest that this documentation has been prepared under the direction and in the presence of Bethann Berkshire, MD. Electronically Signed: Marica Otter, ED Scribe. 02/19/2016. 11:06 AM.   Chief Complaint  Patient presents with  . Chest Pain   Patient is a 55 y.o. male presenting with chest pain.  Chest Pain Pain location:  Substernal area Pain quality: aching   Pain radiates to:  Does not radiate Pain radiates to the back: no   Pain severity:  Severe Onset quality:  Sudden Duration:  10 hours Timing:  Intermittent Progression:  Unchanged Chronicity:  Recurrent Context: at rest   Relieved by:  None tried Worsened by:  Nothing tried Associated symptoms: dizziness and shortness of breath   Associated symptoms: no abdominal pain, no back pain, no cough, no fatigue and no headache   Risk factors: diabetes mellitus, hypertension, male sex and smoking    PCP: TAPPER,DAVID B, MD HPI Comments: Thomas Donovan is a 55 y.o. male who presents to the Emergency Department complaining of recurrent severe, central chest pain with associated SOB and dizziness onset this morning at 1AM. Pt reports each episode lasts a few minutes. Aggravating factors involve movement and activity. Pt states "it's like my heart is stopping.Marland KitchenMarland KitchenI have to grasp for my breath." Pt notes he has been experiencing similar Sx intermittently for the past 4-5 months. Pt notes he was seen for the same at the ED last week.   Past Medical History  Diagnosis Date  . Degeneration of lumbar intervertebral disc   . Diabetes mellitus   . Gout   . Hypertension   . Chronic back pain   . Cold     recent rx  . History of kidney stones   . GERD (gastroesophageal reflux disease)   . Pneumonia    Past Surgical History  Procedure Laterality Date  . Tonsillectomy    . Back surgery    . Maximum access (mas)posterior lumbar  interbody fusion (plif) 1 level  11/20/2013    Procedure: FOR MAXIMUM ACCESS (MAS) POSTERIOR LUMBAR INTERBODY FUSION LUMBAR FIVE-SACRAL ONE;  Surgeon: Tia Alert, MD;  Location: [facility];  Service: Neurosurgery;;  FOR MAXIMUM ACCESS (MAS) POSTERIOR LUMBAR INTERBODY FUSION LUMBAR FIVE-SACRAL ONE   No family history on file. Social History  Substance Use Topics  . Smoking status: Current Every Day Smoker -- 0.50 packs/day for 10 years    Types: Cigarettes  . Smokeless tobacco: Never Used  . Alcohol Use: 7.2 oz/week    12 Cans of beer per week     Comment: every 2 or 3 days    Review of Systems  Constitutional: Negative for appetite change and fatigue.  HENT: Negative for congestion, ear discharge and sinus pressure.   Eyes: Negative for discharge.  Respiratory: Positive for shortness of breath. Negative for cough.   Cardiovascular: Positive for chest pain.  Gastrointestinal: Negative for abdominal pain and diarrhea.  Genitourinary: Negative for frequency and hematuria.  Musculoskeletal: Negative for back pain.  Skin: Negative for rash.  Neurological: Positive for dizziness. Negative for seizures and headaches.  Psychiatric/Behavioral: Negative for hallucinations.   Allergies  Ibuprofen and Toradol  Home Medications   Prior to Admission medications   Medication Sig Start Date End Date Taking? Authorizing Provider  Carboxymethylcellul-Glycerin (CLEAR EYES FOR DRY EYES OP) Apply 1 drop to eye daily as needed (for  itching and irritation).    Historical Provider, MD  colchicine 0.6 MG tablet Take 0.6 mg by mouth daily as needed (gout flare).     Historical Provider, MD  gabapentin (NEURONTIN) 300 MG capsule TAKE ONE CAPSULE 3 TIMES A DAY 07/20/15   Historical Provider, MD  insulin NPH Human (HUMULIN N,NOVOLIN N) 100 UNIT/ML injection Inject 30 Units into the skin 3 (three) times daily.     Historical Provider, MD  lisinopril-hydrochlorothiazide (PRINZIDE,ZESTORETIC) 20-25 MG per  tablet Take 1.5 tablets by mouth daily.  12/10/14   Historical Provider, MD  omeprazole (PRILOSEC) 20 MG capsule Take 1 capsule by mouth 2 (two) times daily. 12/06/14   Historical Provider, MD  oxyCODONE-acetaminophen (PERCOCET) 10-325 MG tablet Take 1 tablet by mouth every 6 (six) hours as needed for pain.  01/09/16   Historical Provider, MD  pantoprazole (PROTONIX) 20 MG tablet Take 1 tablet (20 mg total) by mouth daily. 02/09/16   Zadie Rhine, MD   Triage Vitals: BP 134/73 mmHg  Pulse 76  Temp(Src) 97.6 F (36.4 C) (Oral)  Resp 12  Ht 6' (1.829 m)  Wt 285 lb (129.275 kg)  BMI 38.64 kg/m2  SpO2 98% Physical Exam  Constitutional: He is oriented to person, place, and time. He appears well-developed.  HENT:  Head: Normocephalic.  Eyes: Conjunctivae and EOM are normal. No scleral icterus.  Neck: Neck supple. No thyromegaly present.  Cardiovascular: Normal rate and regular rhythm.  Exam reveals no gallop and no friction rub.   No murmur heard. Pulmonary/Chest: No stridor. He has no wheezes. He has no rales. He exhibits no tenderness.  Abdominal: He exhibits no distension. There is no tenderness. There is no rebound.  Musculoskeletal: Normal range of motion. He exhibits no edema.  Lymphadenopathy:    He has no cervical adenopathy.  Neurological: He is oriented to person, place, and time. He exhibits normal muscle tone. Coordination normal.  Skin: No rash noted. No erythema.  Psychiatric: He has a normal mood and affect. His behavior is normal.    ED Course  Procedures (including critical care time) DIAGNOSTIC STUDIES: Oxygen Saturation is 98% on ra, nl by my interpretation.    COORDINATION OF CARE: 11:05 AM: Discussed treatment plan which includes labs, imaging and meds for pain management with pt at bedside; patient verbalizes understanding and agrees with treatment plan.  Labs Review Labs Reviewed  BASIC METABOLIC PANEL - Abnormal; Notable for the following:    Glucose, Bld  259 (*)    Calcium 8.4 (*)    All other components within normal limits  CBC WITH DIFFERENTIAL/PLATELET  TROPONIN I  D-DIMER, QUANTITATIVE (NOT AT [facility])    Imaging Review Dg Chest Portable 1 View  02/19/2016  CLINICAL DATA:  Chest pain for 1 month.  Hypertension EXAM: PORTABLE CHEST 1 VIEW COMPARISON:  February 04, 2016 FINDINGS: There is no edema or consolidation. Heart size and pulmonary vascularity are normal. No adenopathy. No pneumothorax. No bone lesions. IMPRESSION: No edema or consolidation. Electronically Signed   By: Bretta Bang III M.D.   On: 02/19/2016 10:31   I have personally reviewed and evaluated these images and lab results as part of my medical decision-making.   EKG Interpretation   Date/Time:  Friday February 19 2016 10:08:49 EST Ventricular Rate:  82 PR Interval:  146 QRS Duration: 89 QT Interval:  378 QTC Calculation: 441 R Axis:   42 Text Interpretation:  Sinus rhythm Low voltage, precordial leads Confirmed  by Ronte Parker  MD, Livianna Petraglia (  [id number]) on 02/19/2016 11:04:38 AM      MDM   Final diagnoses:  None    Patient with noncardiac chest pain. Worse when he moves. Some shortness of breath when he gets up. Patient has been to the emergency department 4 times for the symptoms always a negative workup. He has been put on protonic for GERD. He did have a stress test over a year ago that was unremarkable. Troponin and d-dimer EKG chest x-ray all unremarkable. Suspect his pain is more chest wall pain. Will refer patient to cardiology to see if he needs further evaluation  The chart was scribed for me under my direct supervision.  I personally performed the history, physical, and medical decision making and all procedures in the evaluation of this patient.Bethann Berkshire, MD 02/19/16 1340

## 2016-02-19 NOTE — ED Notes (Signed)
MD made aware of pt states chronic pain medication due at this time and states pain at level 8 to back and chest. New order for percocet.

## 2016-02-19 NOTE — Discharge Instructions (Signed)
Call make an appointment with the cardiology group here in [place] for possible stress test

## 2016-02-19 NOTE — ED Notes (Signed)
MD at bedside. 

## 2016-05-18 ENCOUNTER — Encounter (HOSPITAL_COMMUNITY): Payer: Self-pay | Admitting: Emergency Medicine

## 2016-05-18 ENCOUNTER — Emergency Department (HOSPITAL_COMMUNITY)
Admission: EM | Admit: 2016-05-18 | Discharge: 2016-05-18 | Disposition: A | Discharge status: Home / Self Care | Payer: Medicare Other | Attending: Emergency Medicine | Admitting: Emergency Medicine

## 2016-05-18 DIAGNOSIS — F17210 Nicotine dependence, cigarettes, uncomplicated: Secondary | ICD-10-CM | POA: Insufficient documentation

## 2016-05-18 DIAGNOSIS — R531 Weakness: Secondary | ICD-10-CM | POA: Insufficient documentation

## 2016-05-18 DIAGNOSIS — I10 Essential (primary) hypertension: Secondary | ICD-10-CM | POA: Diagnosis not present

## 2016-05-18 DIAGNOSIS — M25572 Pain in left ankle and joints of left foot: Secondary | ICD-10-CM | POA: Insufficient documentation

## 2016-05-18 DIAGNOSIS — M2550 Pain in unspecified joint: Secondary | ICD-10-CM

## 2016-05-18 DIAGNOSIS — R109 Unspecified abdominal pain: Secondary | ICD-10-CM | POA: Diagnosis not present

## 2016-05-18 DIAGNOSIS — M25561 Pain in right knee: Secondary | ICD-10-CM | POA: Insufficient documentation

## 2016-05-18 DIAGNOSIS — E119 Type 2 diabetes mellitus without complications: Secondary | ICD-10-CM | POA: Diagnosis not present

## 2016-05-18 DIAGNOSIS — M25562 Pain in left knee: Secondary | ICD-10-CM | POA: Insufficient documentation

## 2016-05-18 DIAGNOSIS — M2540 Effusion, unspecified joint: Secondary | ICD-10-CM | POA: Diagnosis not present

## 2016-05-18 DIAGNOSIS — Z794 Long term (current) use of insulin: Secondary | ICD-10-CM | POA: Diagnosis not present

## 2016-05-18 DIAGNOSIS — M25571 Pain in right ankle and joints of right foot: Secondary | ICD-10-CM | POA: Insufficient documentation

## 2016-05-18 DIAGNOSIS — R21 Rash and other nonspecific skin eruption: Secondary | ICD-10-CM | POA: Insufficient documentation

## 2016-05-18 MED ORDER — OXYCODONE-ACETAMINOPHEN 5-325 MG PO TABS
1.0000 | ORAL_TABLET | Freq: Once | ORAL | Status: AC
  Administered 2016-05-18: 1 via ORAL
  Filled 2016-05-18: qty 1

## 2016-05-18 NOTE — Discharge Instructions (Signed)

## 2016-05-18 NOTE — ED Provider Notes (Signed)
CSN: [id number]     Arrival date & time 05/18/16  [id number] History  By signing my name below, I, Thomas Donovan, attest that this documentation has been prepared under the direction and in the presence of Zadie Rhineonald Espn Zeman, MD.   Electronically Signed: Iona Beardhristian Donovan, ED Scribe. 05/18/2016. 10:17 AM   Chief Complaint  Patient presents with  . Leg Pain   Patient is a 55 y.o. male presenting with leg pain. The history is provided by the patient. No language interpreter was used.  Leg Pain Location:  Leg Injury: no   Leg location:  L leg, R leg, L lower leg and R lower leg Pain details:    Quality:  Unable to specify   Radiates to:  Does not radiate   Severity:  Mild   Onset quality:  Gradual   Duration:  2 days   Timing:  Constant   Progression:  Unchanged Chronicity:  New Dislocation: no   Foreign body present:  No foreign bodies Prior injury to area:  No Relieved by:  Nothing Worsened by:  Nothing tried Ineffective treatments:  None tried Associated symptoms: no back pain and no fever    HPI Comments: Thomas Donovan is a 55 y.o. male who presents to the Emergency Department complaining of gradual onset, bilateral knee and ankle pain, ongoing for two days. Pt notes associated mild joint swelling, generalized weakness, generalized joint pain, rash on right leg, and flank pain. Pt states his pain presents differently than his previous episodes of gout. No other associated symptoms noted. No worsening or alleviating factors noted. Pt denies leg injury, fever, chills, insect bites, chest pain, back pain, abdominal pain, nausea, emesis, or any other pertinent symptoms. Pt is a current drinker and smoker.  No drug use reported Past Medical History  Diagnosis Date  . Degeneration of lumbar intervertebral disc   . Diabetes mellitus   . Gout   . Hypertension   . Chronic back pain   . Cold     recent rx  . History of kidney stones   . GERD (gastroesophageal reflux disease)   .  Pneumonia    Past Surgical History  Procedure Laterality Date  . Tonsillectomy    . Back surgery    . Maximum access (mas)posterior lumbar interbody fusion (plif) 1 level  11/20/2013    Procedure: FOR MAXIMUM ACCESS (MAS) POSTERIOR LUMBAR INTERBODY FUSION LUMBAR FIVE-SACRAL ONE;  Surgeon: Tia Alertavid S Jones, MD;  Location: [facility];  Service: Neurosurgery;;  FOR MAXIMUM ACCESS (MAS) POSTERIOR LUMBAR INTERBODY FUSION LUMBAR FIVE-SACRAL ONE   No family history on file. Social History  Substance Use Topics  . Smoking status: Current Every Day Smoker -- 0.50 packs/day for 10 years    Types: Cigarettes  . Smokeless tobacco: Never Used  . Alcohol Use: 7.2 oz/week    12 Cans of beer per week     Comment: every 2 or 3 days    Review of Systems  Constitutional: Negative for fever and chills.  Cardiovascular: Negative for chest pain.  Gastrointestinal: Negative for nausea, vomiting and abdominal pain.  Genitourinary: Positive for flank pain.  Musculoskeletal: Positive for joint swelling and arthralgias. Negative for back pain.  Skin: Positive for rash.  Neurological: Positive for weakness.  All other systems reviewed and are negative.   Allergies  Ibuprofen and Toradol  Home Medications   Prior to Admission medications   Medication Sig Start Date End Date Taking? Authorizing Provider  Carboxymethylcellul-Glycerin (CLEAR EYES FOR DRY  EYES OP) Apply 1 drop to eye daily as needed (for itching and irritation).    Historical Provider, MD  colchicine 0.6 MG tablet Take 0.6 mg by mouth daily as needed (gout flare).     Historical Provider, MD  gabapentin (NEURONTIN) 300 MG capsule TAKE ONE CAPSULE 3 TIMES A DAY 07/20/15   Historical Provider, MD  insulin NPH Human (HUMULIN N,NOVOLIN N) 100 UNIT/ML injection Inject 30 Units into the skin 3 (three) times daily.     Historical Provider, MD  lisinopril-hydrochlorothiazide (PRINZIDE,ZESTORETIC) 20-25 MG per tablet Take 1.5 tablets by mouth daily.   12/10/14   Historical Provider, MD  oxyCODONE-acetaminophen (PERCOCET) 10-325 MG tablet Take 1 tablet by mouth every 6 (six) hours as needed for pain.  01/09/16   Historical Provider, MD  pantoprazole (PROTONIX) 20 MG tablet Take 1 tablet (20 mg total) by mouth daily. 02/09/16   Zadie Rhine, MD   BP 162/83 mmHg  Pulse 89  Temp(Src) 98.5 F (36.9 C) (Oral)  Resp 16  Ht 6' (1.829 m)  Wt 280 lb (127.007 kg)  BMI 37.97 kg/m2  SpO2 98% Physical Exam CONSTITUTIONAL: Well developed/well nourished HEAD: Normocephalic/atraumatic EYES: EOMI/PERRL ENMT: Mucous membranes moist NECK: supple no meningeal signs SPINE/BACK:entire spine nontender CV: S1/S2 noted, no murmurs/rubs/gallops noted LUNGS: Lungs are clear to auscultation bilaterally, no apparent distress ABDOMEN: soft, nontender GU:no cva tenderness NEURO: Pt is awake/alert/appropriate, moves all extremitiesx4.  No facial droop.   EXTREMITIES: pulses normal/equal, full ROM, diffuse TTP to bilateral knees and ankles, mild swelling to joints noted, no warmth, no hot joints, full ROM of all extremities,  SKIN: warm, color normal, scattered papules to right leg, no petechiae, no lesions to hands/soles PSYCH: no abnormalities of mood noted, alert and oriented to situation  ED Course  Procedures DIAGNOSTIC STUDIES: Oxygen Saturation is 98% on RA, normal by my interpretation.    COORDINATION OF CARE: 10:17 AM Discussed treatment plan with pt at bedside and pt agreed to plan. Pt well appearing Nontoxic in appearance No signs of septic joint No signs of cellulitis Minimal rash noted, doubt tick borne illness Will d/c home with PCP followup/management for arthralgia No indication to start steroids at this time   MDM   Final diagnoses:  Arthralgia    Nursing notes including past medical history and social history reviewed and considered in documentation Previous records reviewed and considered Narcotic database reviewed and  considered in decision making   I personally performed the services described in this documentation, which was scribed in my presence. The recorded information has been reviewed and is accurate.        Zadie Rhine, MD 05/18/16 1054

## 2016-05-18 NOTE — ED Notes (Signed)
Pt c/o bilateral leg pain x 2 days. Denies injury.

## 2016-09-07 ENCOUNTER — Encounter (HOSPITAL_COMMUNITY): Payer: Self-pay | Admitting: Emergency Medicine

## 2016-09-07 ENCOUNTER — Emergency Department (HOSPITAL_COMMUNITY)
Admission: EM | Admit: 2016-09-07 | Discharge: 2016-09-07 | Disposition: A | Discharge status: Home / Self Care | Payer: Medicare Other | Attending: Emergency Medicine | Admitting: Emergency Medicine

## 2016-09-07 DIAGNOSIS — E119 Type 2 diabetes mellitus without complications: Secondary | ICD-10-CM | POA: Diagnosis not present

## 2016-09-07 DIAGNOSIS — M79671 Pain in right foot: Secondary | ICD-10-CM | POA: Diagnosis not present

## 2016-09-07 DIAGNOSIS — Z794 Long term (current) use of insulin: Secondary | ICD-10-CM | POA: Diagnosis not present

## 2016-09-07 DIAGNOSIS — I10 Essential (primary) hypertension: Secondary | ICD-10-CM | POA: Diagnosis not present

## 2016-09-07 DIAGNOSIS — Z79899 Other long term (current) drug therapy: Secondary | ICD-10-CM | POA: Insufficient documentation

## 2016-09-07 DIAGNOSIS — F17210 Nicotine dependence, cigarettes, uncomplicated: Secondary | ICD-10-CM | POA: Diagnosis not present

## 2016-09-07 DIAGNOSIS — Z7984 Long term (current) use of oral hypoglycemic drugs: Secondary | ICD-10-CM | POA: Insufficient documentation

## 2016-09-07 MED ORDER — FENTANYL CITRATE (PF) 100 MCG/2ML IJ SOLN
50.0000 ug | Freq: Once | INTRAMUSCULAR | Status: AC
  Administered 2016-09-07: 50 ug via INTRAMUSCULAR
  Filled 2016-09-07: qty 2

## 2016-09-07 NOTE — ED Notes (Signed)
EDP at bedside updating patient. 

## 2016-09-07 NOTE — Discharge Instructions (Signed)
Continue current medications.  Discuss pain management with your pain management doctor.

## 2016-09-07 NOTE — ED Triage Notes (Signed)
Pt reports right foot/heel pain x1 week. Pt reports not able to bear weight on RLE.

## 2016-09-07 NOTE — ED Provider Notes (Signed)
[facility] Provider Note   CSN: [id number] Arrival date & time: 09/07/16  1018     History   Chief Complaint Chief Complaint  Patient presents with  . Foot Pain    HPI Thomas Donovan is a 55 y.o. male.  The history is provided by the patient. No language interpreter was used.  Foot Pain  This is a new problem. The current episode started more than 2 days ago. The problem occurs constantly. The problem has been gradually worsening. Pertinent negatives include no headaches. Nothing aggravates the symptoms. Nothing relieves the symptoms. He has tried nothing for the symptoms. The treatment provided moderate relief.  Pt complains of pain in his right heel.  He was seen on 9/9 and had an xray   Past Medical History:  Diagnosis Date  . Chronic back pain   . Cold    recent rx  . Degeneration of lumbar intervertebral disc   . Diabetes mellitus   . GERD (gastroesophageal reflux disease)   . Gout   . History of kidney stones   . Hypertension   . Pneumonia     Patient Active Problem List   Diagnosis Date Noted  . 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 BACK SURGERY    . MAXIMUM ACCESS (MAS)POSTERIOR LUMBAR INTERBODY FUSION (PLIF) 1 LEVEL  11/20/2013   Procedure: FOR MAXIMUM ACCESS (MAS) POSTERIOR LUMBAR INTERBODY FUSION LUMBAR FIVE-SACRAL ONE;  Surgeon: Tia Alert, MD;  Location: [facility];  Service: Neurosurgery;;  FOR MAXIMUM ACCESS (MAS) POSTERIOR LUMBAR INTERBODY FUSION LUMBAR FIVE-SACRAL ONE  . TONSILLECTOMY         Home Medications    Prior to Admission medications   Medication Sig Start Date End Date Taking? Authorizing Provider  colchicine 0.6 MG tablet Take 0.6 mg by mouth daily as needed (gout flare).    Yes Historical Provider, MD  gabapentin (NEURONTIN) 600 MG tablet Take 600 mg by mouth 4 (four) times daily. 08/20/16  Yes Historical Provider, MD  insulin NPH Human (HUMULIN N,NOVOLIN N) 100  UNIT/ML injection Inject 30 Units into the skin 3 (three) times daily.    Yes Historical Provider, MD  lisinopril-hydrochlorothiazide (PRINZIDE,ZESTORETIC) 20-25 MG per tablet Take 1.5 tablets by mouth daily.  12/10/14  Yes Historical Provider, MD  metFORMIN (GLUCOPHAGE-XR) 500 MG 24 hr tablet Take 500-1,000 mg by mouth 2 (two) times daily. Take 2 tablet by mouth in the morning and 1 tablet at bedtime. 08/22/16  Yes Historical Provider, MD  omeprazole (PRILOSEC) 20 MG capsule Take 1 capsule by mouth 2 (two) times daily. 08/20/16  Yes Historical Provider, MD  VENTOLIN HFA 108 (90 Base) MCG/ACT inhaler Inhale 2 puffs into the lungs every 6 (six) hours as needed for wheezing or shortness of breath.  08/25/16  Yes Historical Provider, MD  pantoprazole (PROTONIX) 20 MG tablet Take 1 tablet (20 mg total) by mouth daily. Patient not taking: Reported on 09/07/2016 02/09/16   Zadie Rhine, MD    Family History History reviewed. No pertinent family history.  Social History Social History  Substance Use Topics  . Smoking status: Current Every Day Smoker    Packs/day: 0.50    Years: 10.00    Types: Cigarettes  . Smokeless tobacco: Never Used  . Alcohol use 7.2 oz/week    12 Cans of beer per week     Comment: every 2 or 3 days     Allergies  Ibuprofen and Toradol [ketorolac tromethamine]   Review of Systems Review of Systems  Neurological: Negative for headaches.     Physical Exam Updated Vital Signs BP 138/79   Pulse 94   Temp 98.1 F (36.7 C) (Oral)   Resp 18   Ht 6' (1.829 m)   Wt 127 kg   SpO2 100%   BMI 37.97 kg/m   Physical Exam   ED Treatments / Results  Labs (all labs ordered are listed, but only abnormal results are displayed) Labs Reviewed - No data to display  EKG  EKG Interpretation None       Radiology No results found.  Procedures Procedures (including critical care time)  Medications Ordered in ED Medications  fentaNYL (SUBLIMAZE) injection 50  mcg (50 mcg Intramuscular Given 09/07/16 1145)     Initial Impression / Assessment and Plan / ED Course  I have reviewed the triage vital signs and the nursing notes.  Pertinent labs & imaging results that were available during my care of the patient were reviewed by me and considered in my medical decision making (see chart for details).  Clinical Course   [facility].  No acute abnormality.  Pt given injection of IM pain medications.  I reviewed [place] database.  Pt received 180 Percocet from Dr. Margo Commonapper 8/31.    Final Clinical Impressions(s) / ED Diagnoses   Final diagnoses:  Foot pain, right    New Prescriptions Discharge Medication List as of 09/07/2016 11:33 AM    An After Visit Summary was printed and given to the patient.   Lonia SkinnerLeslie K Linoma BeachSofia, PA-C 09/07/16 1211    Mancel BaleElliott Wentz, MD 09/07/16 [phone]

## 2016-10-03 ENCOUNTER — Encounter (HOSPITAL_COMMUNITY): Payer: Self-pay | Admitting: Emergency Medicine

## 2016-10-03 ENCOUNTER — Emergency Department (HOSPITAL_COMMUNITY)
Admission: EM | Admit: 2016-10-03 | Discharge: 2016-10-03 | Disposition: A | Discharge status: Home / Self Care | Payer: Medicare Other | Attending: Dermatology | Admitting: Dermatology

## 2016-10-03 DIAGNOSIS — E119 Type 2 diabetes mellitus without complications: Secondary | ICD-10-CM | POA: Diagnosis not present

## 2016-10-03 DIAGNOSIS — I10 Essential (primary) hypertension: Secondary | ICD-10-CM | POA: Diagnosis not present

## 2016-10-03 DIAGNOSIS — Z79899 Other long term (current) drug therapy: Secondary | ICD-10-CM | POA: Diagnosis not present

## 2016-10-03 DIAGNOSIS — F17210 Nicotine dependence, cigarettes, uncomplicated: Secondary | ICD-10-CM | POA: Insufficient documentation

## 2016-10-03 DIAGNOSIS — N200 Calculus of kidney: Secondary | ICD-10-CM | POA: Insufficient documentation

## 2016-10-03 DIAGNOSIS — Z5321 Procedure and treatment not carried out due to patient leaving prior to being seen by health care provider: Secondary | ICD-10-CM | POA: Insufficient documentation

## 2016-10-03 DIAGNOSIS — Z794 Long term (current) use of insulin: Secondary | ICD-10-CM | POA: Insufficient documentation

## 2016-10-03 LAB — URINE MICROSCOPIC-ADD ON

## 2016-10-03 LAB — CBC
HEMATOCRIT: 40.6 % (ref 39.0–52.0)
Hemoglobin: 13.6 g/dL (ref 13.0–17.0)
MCH: 30 pg (ref 26.0–34.0)
MCHC: 33.5 g/dL (ref 30.0–36.0)
MCV: 89.6 fL (ref 78.0–100.0)
Platelets: 266 10*3/uL (ref 150–400)
RBC: 4.53 MIL/uL (ref 4.22–5.81)
RDW: 14 % (ref 11.5–15.5)
WBC: 8.8 10*3/uL (ref 4.0–10.5)

## 2016-10-03 LAB — URINALYSIS, ROUTINE W REFLEX MICROSCOPIC
Bilirubin Urine: NEGATIVE
Glucose, UA: >1000 mg/dL — AB
Ketones, ur: NEGATIVE mg/dL
Leukocytes, UA: NEGATIVE
Nitrite: NEGATIVE
Protein, ur: NEGATIVE mg/dL
Specific Gravity, Urine: 1.015 (ref 1.005–1.030)
pH: 5.5 (ref 5.0–8.0)

## 2016-10-03 LAB — BASIC METABOLIC PANEL
ANION GAP: 10 (ref 5–15)
BUN: 18 mg/dL (ref 6–20)
CALCIUM: 9.6 mg/dL (ref 8.9–10.3)
CHLORIDE: 94 mmol/L — AB (ref 101–111)
CO2: 29 mmol/L (ref 22–32)
Creatinine, Ser: 1.09 mg/dL (ref 0.61–1.24)
Glucose, Bld: 298 mg/dL — ABNORMAL HIGH (ref 65–99)
POTASSIUM: 3.9 mmol/L (ref 3.5–5.1)
SODIUM: 133 mmol/L — AB (ref 135–145)

## 2016-10-03 NOTE — ED Triage Notes (Signed)
Pt reports kidney stone in left side, pt went to [facility] 2 weeks ago and dx with kidney stone 7mm.  Pt was given flomax and pain eased. Pt now having increased pain, denies difficulty urinating, but does have burning with urination. Pt also having sob today.  Pt alert and oriented at this time.

## 2016-10-03 NOTE — ED Notes (Signed)
Pt called to family waiting area in lobby for VS recheck, pt not in waiting room.

## 2016-10-03 NOTE — ED Notes (Signed)
Pt called X3 for VS recheck, not in lobby.

## 2016-10-03 NOTE — ED Notes (Signed)
Pt called X2 for VS recheck, not in lobby.

## 2016-10-03 NOTE — ED Notes (Signed)
EKG given to Dr. Zavitz 

## 2016-12-15 ENCOUNTER — Encounter (HOSPITAL_COMMUNITY): Payer: Self-pay | Admitting: *Deleted

## 2016-12-15 ENCOUNTER — Emergency Department (HOSPITAL_COMMUNITY)
Admission: EM | Admit: 2016-12-15 | Discharge: 2016-12-15 | Disposition: A | Discharge status: Home / Self Care | Payer: Medicare Other | Attending: Emergency Medicine | Admitting: Emergency Medicine

## 2016-12-15 DIAGNOSIS — F17210 Nicotine dependence, cigarettes, uncomplicated: Secondary | ICD-10-CM | POA: Diagnosis not present

## 2016-12-15 DIAGNOSIS — M10062 Idiopathic gout, left knee: Secondary | ICD-10-CM | POA: Diagnosis not present

## 2016-12-15 DIAGNOSIS — E119 Type 2 diabetes mellitus without complications: Secondary | ICD-10-CM | POA: Diagnosis not present

## 2016-12-15 DIAGNOSIS — Z794 Long term (current) use of insulin: Secondary | ICD-10-CM | POA: Insufficient documentation

## 2016-12-15 DIAGNOSIS — Z79899 Other long term (current) drug therapy: Secondary | ICD-10-CM | POA: Insufficient documentation

## 2016-12-15 DIAGNOSIS — M109 Gout, unspecified: Secondary | ICD-10-CM

## 2016-12-15 DIAGNOSIS — I10 Essential (primary) hypertension: Secondary | ICD-10-CM | POA: Diagnosis not present

## 2016-12-15 DIAGNOSIS — M25562 Pain in left knee: Secondary | ICD-10-CM | POA: Diagnosis present

## 2016-12-15 LAB — CBC WITH DIFFERENTIAL/PLATELET
Basophils Absolute: 0 10*3/uL (ref 0.0–0.1)
Basophils Relative: 0 %
EOS ABS: 0.4 10*3/uL (ref 0.0–0.7)
EOS PCT: 4 %
HCT: 41.1 % (ref 39.0–52.0)
Hemoglobin: 13.3 g/dL (ref 13.0–17.0)
LYMPHS ABS: 2 10*3/uL (ref 0.7–4.0)
Lymphocytes Relative: 20 %
MCH: 29.5 pg (ref 26.0–34.0)
MCHC: 32.4 g/dL (ref 30.0–36.0)
MCV: 91.1 fL (ref 78.0–100.0)
MONO ABS: 1 10*3/uL (ref 0.1–1.0)
Monocytes Relative: 10 %
Neutro Abs: 6.5 10*3/uL (ref 1.7–7.7)
Neutrophils Relative %: 66 %
PLATELETS: 232 10*3/uL (ref 150–400)
RBC: 4.51 MIL/uL (ref 4.22–5.81)
RDW: 14.5 % (ref 11.5–15.5)
WBC: 9.9 10*3/uL (ref 4.0–10.5)

## 2016-12-15 LAB — BASIC METABOLIC PANEL
Anion gap: 10 (ref 5–15)
BUN: 17 mg/dL (ref 6–20)
CALCIUM: 9 mg/dL (ref 8.9–10.3)
CO2: 28 mmol/L (ref 22–32)
CREATININE: 1.09 mg/dL (ref 0.61–1.24)
Chloride: 97 mmol/L — ABNORMAL LOW (ref 101–111)
GFR calc Af Amer: >60 mL/min (ref >60)
Glucose, Bld: 187 mg/dL — ABNORMAL HIGH (ref 65–99)
POTASSIUM: 3.9 mmol/L (ref 3.5–5.1)
SODIUM: 135 mmol/L (ref 135–145)

## 2016-12-15 LAB — GRAM STAIN

## 2016-12-15 LAB — BODY FLUID CELL COUNT WITH DIFFERENTIAL
Eos, Fluid: 0 %
LYMPHS FL: 0 %
MONOCYTE-MACROPHAGE-SEROUS FLUID: 3 % — AB (ref 50–90)
NEUTROPHIL FLUID: 97 % — AB (ref 0–25)
WBC FLUID: 9900 uL — AB (ref 0–1000)

## 2016-12-15 LAB — SYNOVIAL FLUID, CRYSTAL

## 2016-12-15 MED ORDER — POVIDONE-IODINE 10 % EX SOLN
CUTANEOUS | Status: AC
  Filled 2016-12-15: qty 118

## 2016-12-15 MED ORDER — HYDROMORPHONE HCL 1 MG/ML IJ SOLN
1.0000 mg | Freq: Once | INTRAMUSCULAR | Status: AC
  Administered 2016-12-15: 1 mg via INTRAMUSCULAR
  Filled 2016-12-15: qty 1

## 2016-12-15 MED ORDER — HYDROMORPHONE HCL 2 MG/ML IJ SOLN
2.0000 mg | Freq: Once | INTRAMUSCULAR | Status: AC
  Administered 2016-12-15: 2 mg via INTRAMUSCULAR
  Filled 2016-12-15: qty 1

## 2016-12-15 MED ORDER — HYDROMORPHONE HCL 2 MG PO TABS: 2.0000 mg | ORAL_TABLET | ORAL | 0 refills | Status: DC | PRN

## 2016-12-15 MED ORDER — HYDROMORPHONE HCL 2 MG PO TABS
2.0000 mg | ORAL_TABLET | Freq: Once | ORAL | Status: AC
  Administered 2016-12-15: 2 mg via ORAL

## 2016-12-15 MED ORDER — HYDROMORPHONE HCL 2 MG PO TABS
ORAL_TABLET | ORAL | Status: AC
  Administered 2016-12-15: 2 mg via ORAL
  Filled 2016-12-15: qty 1

## 2016-12-15 MED ORDER — ONDANSETRON 8 MG PO TBDP
8.0000 mg | ORAL_TABLET | Freq: Once | ORAL | Status: AC
  Administered 2016-12-15: 8 mg via ORAL
  Filled 2016-12-15: qty 1

## 2016-12-15 MED ORDER — LIDOCAINE HCL (PF) 1 % IJ SOLN
5.0000 mL | Freq: Once | INTRAMUSCULAR | Status: AC
  Administered 2016-12-15: 5 mL
  Filled 2016-12-15: qty 5

## 2016-12-15 NOTE — ED Triage Notes (Signed)
Pt comes in with left knee pain starting yesterday. Pt has hx of gout and states this feels the same. Pt unable to walk on this extremity. NAD noted.

## 2016-12-15 NOTE — Discharge Instructions (Signed)
Start taking the [facility].  You may take the medicine prescribed in place of your percocet if needed for pain relief.   This will make you drowsy - do not drive within 4 hours of taking this medication.

## 2016-12-15 NOTE — ED Notes (Signed)
Pt nausea and continues to be in pain, PA placed orders, tray set up to tap knee

## 2016-12-15 NOTE — ED Provider Notes (Signed)
[facility] Provider Note   CSN: [id number] Arrival date & time: 12/15/16  0900     History   Chief Complaint Chief Complaint  Patient presents with  . Knee Pain    HPI Thomas Donovan is a 55 y.o. male with past medical history as outlined below, stating he is not well controlled with his diabetes presenting with severe pain and swelling in his left knee since yesterday.  He states his pain is similar to prior gouty attacks, but the swelling is more severe than normal.  He has been unable to weight bear today.  [facility] for this same condition when it was in both knees since he was unable to walk.  He denies fevers, chills, headaches but is nauseated and feels the pain is the source of this symptom. No vomiting, no abdominal pain. He takes gabapentin daily and added colchicine 1.2 mg, then 0.6 mg an hour later 2 days in a row with no improvement. He is also on oxycodone for chronic back pain which has not touched his knee pain.  The history is provided by the patient.    Past Medical History:  Diagnosis Date  . Chronic back pain   . Cold    recent rx  . Degeneration of lumbar intervertebral disc   . Diabetes mellitus   . GERD (gastroesophageal reflux disease)   . Gout   . History of kidney stones   . Hypertension   . Pneumonia     Patient Active Problem List   Diagnosis Date Noted  . 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 BACK SURGERY    . MAXIMUM ACCESS (MAS)POSTERIOR LUMBAR INTERBODY FUSION (PLIF) 1 LEVEL  11/20/2013   Procedure: FOR MAXIMUM ACCESS (MAS) POSTERIOR LUMBAR INTERBODY FUSION LUMBAR FIVE-SACRAL ONE;  Surgeon: Tia Alertavid S Jones, MD;  Location: [facility];  Service: Neurosurgery;;  FOR MAXIMUM ACCESS (MAS) POSTERIOR LUMBAR INTERBODY FUSION LUMBAR FIVE-SACRAL ONE  . TONSILLECTOMY         Home Medications    Prior to Admission medications   Medication Sig  Start Date End Date Taking? Authorizing Provider  colchicine 0.6 MG tablet Take 0.6 mg by mouth daily as needed (gout flare).    Yes Historical Provider, MD  gabapentin (NEURONTIN) 600 MG tablet Take 600 mg by mouth 4 (four) times daily. 08/20/16  Yes Historical Provider, MD  insulin NPH Human (HUMULIN N,NOVOLIN N) 100 UNIT/ML injection Inject 30 Units into the skin 3 (three) times daily.    Yes Historical Provider, MD  lisinopril-hydrochlorothiazide (PRINZIDE,ZESTORETIC) 20-25 MG per tablet Take 1.5 tablets by mouth daily.  12/10/14  Yes Historical Provider, MD  metFORMIN (GLUCOPHAGE-XR) 500 MG 24 hr tablet Take 500-1,000 mg by mouth 2 (two) times daily. Take 2 tablet by mouth in the morning and 1 tablet at bedtime. 08/22/16  Yes Historical Provider, MD  omeprazole (PRILOSEC) 20 MG capsule Take 1 capsule by mouth 2 (two) times daily. 08/20/16  Yes Historical Provider, MD  oxyCODONE-acetaminophen (PERCOCET) 10-325 MG tablet Take 1 tablet by mouth every 6 (six) hours as needed. 11/18/16  Yes Historical Provider, MD  VENTOLIN HFA 108 (90 Base) MCG/ACT inhaler Inhale 2 puffs into the lungs every 6 (six) hours as needed for wheezing or shortness of breath.  08/25/16  Yes Historical Provider, MD  HYDROmorphone (DILAUDID) 2 MG tablet Take 1 tablet (2 mg total) by mouth every 4 (four)  hours as needed for severe pain. 12/15/16   Burgess Amor, PA-C  pantoprazole (PROTONIX) 20 MG tablet Take 1 tablet (20 mg total) by mouth daily. Patient not taking: Reported on 09/07/2016 02/09/16   Zadie Rhine, MD  predniSONE (DELTASONE) 20 MG tablet Take 1 tablet by mouth daily. 12/14/16   Historical Provider, MD    Family History No family history on file.  Social History Social History  Substance Use Topics  . Smoking status: Current Every Day Smoker    Packs/day: 0.50    Years: 10.00    Types: Cigarettes  . Smokeless tobacco: Never Used  . Alcohol use 7.2 oz/week    12 Cans of beer per week     Comment: every 2 or  3 days     Allergies   Ibuprofen and Toradol [ketorolac tromethamine]   Review of Systems Review of Systems  Constitutional: Negative for fever.  Musculoskeletal: Positive for arthralgias and joint swelling. Negative for myalgias.  Skin: Negative for color change.  Neurological: Negative for weakness and numbness.     Physical Exam Updated Vital Signs BP 139/90 (BP Location: Left Arm)   Pulse 99   Temp 98.2 F (36.8 C) (Oral)   Resp 16   Ht 6' (1.829 m)   Wt 136.1 kg   SpO2 96%   BMI 40.69 kg/m   Physical Exam  Constitutional: He appears well-developed and well-nourished.  HENT:  Head: Atraumatic.  Neck: Normal range of motion.  Cardiovascular:  Pulses equal bilaterally  Musculoskeletal: He exhibits tenderness.       Left knee: He exhibits decreased range of motion, swelling, effusion and deformity. He exhibits no erythema.  No erythema but mild increased warmth of the anterior left knee. Skin intact.  Neurological: He is alert. He has normal strength. He displays normal reflexes. No sensory deficit.  Skin: Skin is warm and dry.  Psychiatric: He has a normal mood and affect.     ED Treatments / Results  Labs  Results for orders placed or performed during the hospital encounter of 12/15/16  Gram stain  Result Value Ref Range   Specimen Description SYNOVIAL    Special Requests Immunocompromised    Gram Stain      CYTOSPIN SMEAR NO ORGANISMS SEEN WBC PRESENT, PREDOMINANTLY PMN Gram Stain Report Called to,Read Back By and Verified With: COCKRAM,R @ 1450 ON 12/15/16 BY BAYSE,L    Report Status 12/15/2016 FINAL   Culture, body fluid-bottle  Result Value Ref Range   Specimen Description SYNOVIAL    Special Requests      BOTTLES DRAWN AEROBIC AND ANAEROBIC AEB 6CC ANA 4CC   Culture NO GROWTH < 24 HOURS    Report Status PENDING   CBC with Differential  Result Value Ref Range   WBC 9.9 4.0 - 10.5 K/uL   RBC 4.51 4.22 - 5.81 MIL/uL   Hemoglobin 13.3 13.0 -  17.0 g/dL   HCT 96.0 45.4 - 09.8 %   MCV 91.1 78.0 - 100.0 fL   MCH 29.5 26.0 - 34.0 pg   MCHC 32.4 30.0 - 36.0 g/dL   RDW 11.9 14.7 - 82.9 %   Platelets 232 150 - 400 K/uL   Neutrophils Relative % 66 %   Neutro Abs 6.5 1.7 - 7.7 K/uL   Lymphocytes Relative 20 %   Lymphs Abs 2.0 0.7 - 4.0 K/uL   Monocytes Relative 10 %   Monocytes Absolute 1.0 0.1 - 1.0 K/uL   Eosinophils Relative 4 %  Eosinophils Absolute 0.4 0.0 - 0.7 K/uL   Basophils Relative 0 %   Basophils Absolute 0.0 0.0 - 0.1 K/uL  Basic metabolic panel  Result Value Ref Range   Sodium 135 135 - 145 mmol/L   Potassium 3.9 3.5 - 5.1 mmol/L   Chloride 97 (L) 101 - 111 mmol/L   CO2 28 22 - 32 mmol/L   Glucose, Bld 187 (H) 65 - 99 mg/dL   BUN 17 6 - 20 mg/dL   Creatinine, Ser 6.57 0.61 - 1.24 mg/dL   Calcium 9.0 8.9 - 84.6 mg/dL   GFR calc non Af Amer >60 >60 mL/min   GFR calc Af Amer >60 >60 mL/min   Anion gap 10 5 - 15  Synovial fluid, crystal  Result Value Ref Range   Crystals, Fluid INTRACELLULAR CALCIUM PYROPHOSPHATE CRYSTALS   Body fluid cell count with differential  Result Value Ref Range   Fluid Type-FCT SYNOVIAL    Color, Fluid YELLOW (A) YELLOW   Appearance, Fluid CLOUDY (A) CLEAR   WBC, Fluid 9,900 (H) 0 - 1,000 cu mm   Neutrophil Count, Fluid 97 (H) 0 - 25 %   Lymphs, Fluid 0 %   Monocyte-Macrophage-Serous Fluid 3 (L) 50 - 90 %   Eos, Fluid 0 %   No results found.   EKG  EKG Interpretation None       Radiology No results found.  Procedures .Joint Aspiration/Arthrocentesis Date/Time: 12/15/2016 1:00 PM Performed by: Burgess Amor Authorized by: Burgess Amor   Consent:    Consent obtained:  Verbal   Consent given by:  Patient   Risks discussed:  Bleeding, infection and pain   Alternatives discussed:  Alternative treatment Location:    Location:  Knee   Knee:  L knee Anesthesia (see MAR for exact dosages):    Anesthesia method:  Local infiltration   Local anesthetic:  Lidocaine 1%  w/o epi Procedure details:    Preparation: Patient was prepped and draped in usual sterile fashion     Needle gauge:  18 G   Ultrasound guidance: no     Approach:  Lateral   Aspirate amount:  80 cc   Aspirate characteristics:  Yellow and cloudy (blood tinged at the very end of the aspiration)   Steroid injected: no     Specimen collected: yes   Post-procedure details:    Dressing:  Adhesive bandage   Patient tolerance of procedure:  Tolerated well, no immediate complications   (including critical care time)  Medications Ordered in ED Medications  HYDROmorphone (DILAUDID) injection 1 mg (1 mg Intramuscular Given 12/15/16 1011)  ondansetron (ZOFRAN-ODT) disintegrating tablet 8 mg (8 mg Oral Given 12/15/16 1012)  lidocaine (PF) (XYLOCAINE) 1 % injection 5 mL (5 mLs Other Given 12/15/16 1202)  ondansetron (ZOFRAN-ODT) disintegrating tablet 8 mg (8 mg Oral Given 12/15/16 1201)  HYDROmorphone (DILAUDID) injection 2 mg (2 mg Intramuscular Given 12/15/16 1202)  HYDROmorphone (DILAUDID) tablet 2 mg (2 mg Oral Given 12/15/16 1545)     Initial Impression / Assessment and Plan / ED Course  I have reviewed the triage vital signs and the nursing notes.  Pertinent labs & imaging results that were available during my care of the patient were reviewed by me and considered in my medical decision making (see chart for details).  Clinical Course     Lab results suggesting gout without infected joint.  Cultures pending at this time.  Wbc count of aspirate is less than the 50,000 required for consideration  of an infectious process.  Pt was placed on dilaudid tablets for better pain relief.  He was also placed on a prednisone taper for this flare.  Advised he will need to watch his blood sugars closely which he is aware.  Crutched given for minimizing weight bearing.  Elevation, heat discussed. F/u with pcp for a recheck if not improving with todays tx.  Final Clinical Impressions(s) / ED Diagnoses    Final diagnoses:  Acute gout of left knee, unspecified cause    New Prescriptions Discharge Medication List as of 12/15/2016  3:35 PM    START taking these medications   Details  HYDROmorphone (DILAUDID) 2 MG tablet Take 1 tablet (2 mg total) by mouth every 4 (four) hours as needed for severe pain., Starting Thu 12/15/2016, Print         Burgess AmorJulie Artrice Kraker, PA-C 12/17/16 [id number]    Bethann BerkshireJoseph Zammit, MD 12/17/16 [phone]

## 2016-12-15 NOTE — ED Notes (Signed)
Patient given discharge instruction, verbalized understand.  Patient in wheelchair out of the department.  

## 2016-12-15 NOTE — ED Notes (Signed)
Lab at bedside

## 2016-12-15 NOTE — ED Notes (Signed)
Left knee swelling, pt states he can not bend his knee, pain shot only help for a few minutes, pain going back up

## 2016-12-15 NOTE — ED Notes (Signed)
CRITICAL VALUE ALERT  Critical value received:  Gram Stain- WBC poly nuclear, no organisms seen  Date of notification:  12/15/16  Time of notification:  1450  Critical value read back:Yes.    Nurse who received alert:  RCockram    MD notified  Raynelle FanningJulie PA

## 2016-12-20 LAB — CULTURE, BODY FLUID W GRAM STAIN -BOTTLE: Culture: NO GROWTH

## 2017-01-16 ENCOUNTER — Encounter (HOSPITAL_COMMUNITY): Payer: Self-pay | Admitting: Emergency Medicine

## 2017-01-16 ENCOUNTER — Emergency Department (HOSPITAL_COMMUNITY)
Admission: EM | Admit: 2017-01-16 | Discharge: 2017-01-16 | Disposition: A | Discharge status: Home / Self Care | Payer: Medicare Other | Attending: Emergency Medicine | Admitting: Emergency Medicine

## 2017-01-16 DIAGNOSIS — Z794 Long term (current) use of insulin: Secondary | ICD-10-CM | POA: Diagnosis not present

## 2017-01-16 DIAGNOSIS — M25561 Pain in right knee: Secondary | ICD-10-CM | POA: Diagnosis present

## 2017-01-16 DIAGNOSIS — Z79899 Other long term (current) drug therapy: Secondary | ICD-10-CM | POA: Insufficient documentation

## 2017-01-16 DIAGNOSIS — M109 Gout, unspecified: Secondary | ICD-10-CM | POA: Insufficient documentation

## 2017-01-16 DIAGNOSIS — F17210 Nicotine dependence, cigarettes, uncomplicated: Secondary | ICD-10-CM | POA: Insufficient documentation

## 2017-01-16 DIAGNOSIS — E119 Type 2 diabetes mellitus without complications: Secondary | ICD-10-CM | POA: Diagnosis not present

## 2017-01-16 DIAGNOSIS — I10 Essential (primary) hypertension: Secondary | ICD-10-CM | POA: Diagnosis not present

## 2017-01-16 MED ORDER — OXYCODONE-ACETAMINOPHEN 5-325 MG PO TABS
2.0000 | ORAL_TABLET | Freq: Once | ORAL | Status: AC
  Administered 2017-01-16: 2 via ORAL
  Filled 2017-01-16: qty 2

## 2017-01-16 MED ORDER — OXYCODONE-ACETAMINOPHEN 5-325 MG PO TABS: 1.0000 | ORAL_TABLET | ORAL | 0 refills | Status: DC | PRN

## 2017-01-16 MED ORDER — OXYCODONE-ACETAMINOPHEN 5-325 MG PO TABS: 1.0000 | ORAL_TABLET | Freq: Once | ORAL | Status: DC

## 2017-01-16 MED ORDER — DEXAMETHASONE SODIUM PHOSPHATE 10 MG/ML IJ SOLN
10.0000 mg | Freq: Once | INTRAMUSCULAR | Status: AC
  Administered 2017-01-16: 10 mg via INTRAMUSCULAR
  Filled 2017-01-16: qty 1

## 2017-01-16 NOTE — ED Notes (Signed)
Bed: WTR5 Expected date:  Expected time:  Means of arrival:  Comments: 

## 2017-01-16 NOTE — Discharge Instructions (Signed)

## 2017-01-16 NOTE — ED Provider Notes (Signed)
[facility]-EMERGENCY DEPT Provider Note   CSN: [id number] Arrival date & time: 01/16/17  1024   By signing my name below, I, Clovis Pu, attest that this documentation has been prepared under the direction and in the presence of  [facility], PA-C. Electronically Signed: Clovis Pu, ED Scribe. 01/16/17. 11:02 AM.   History   Chief Complaint Chief Complaint  Patient presents with  . Gout Flare   The history is provided by the patient. No language interpreter was used.   HPI Comments:  Thomas Donovan is a 56 y.o. male, with a hx of gout, who presents to the Emergency Department complaining of moderate right knee pain that began yesterday. His pain is worse upon palpation and with movement of his leg. He also reports associated chills, which is typical of his gout flares.  Pt states his pain is similar to pain he experiences with gout. He started colcicine yesterday when the pain began  He has recently been on steroids for a gout flare up to his left knee and notes his blood sugar levels have been in the 300-400s but he has been able to adjust his insulin dose and he is monitoring his blood sugars closely. He is not supposed to take NSAIDs due to hx GI bleeding.  He denies lightheadedness, dizziness, weakness, fevers, chest pain, SOB and any recent injuries/falls. Pt also denies eating red meat, cheeses and excessive beer consumption. Pt is not followed by a orthopedist. He is a smoker.  PCP: Louie Boston, MD  Past Medical History:  Diagnosis Date  . Chronic back pain   . Cold    recent rx  . Degeneration of lumbar intervertebral disc   . Diabetes mellitus   . GERD (gastroesophageal reflux disease)   . Gout   . History of kidney stones   . Hypertension   . Pneumonia     Patient Active Problem List   Diagnosis Date Noted  . 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 BACK SURGERY    . MAXIMUM ACCESS (MAS)POSTERIOR LUMBAR  INTERBODY FUSION (PLIF) 1 LEVEL  11/20/2013   Procedure: FOR MAXIMUM ACCESS (MAS) POSTERIOR LUMBAR INTERBODY FUSION LUMBAR FIVE-SACRAL ONE;  Surgeon: Tia Alert, MD;  Location: [facility];  Service: Neurosurgery;;  FOR MAXIMUM ACCESS (MAS) POSTERIOR LUMBAR INTERBODY FUSION LUMBAR FIVE-SACRAL ONE  . TONSILLECTOMY         Home Medications    Prior to Admission medications   Medication Sig Start Date End Date Taking? Authorizing Provider  colchicine 0.6 MG tablet Take 0.6 mg by mouth daily as needed (gout flare).     Historical Provider, MD  gabapentin (NEURONTIN) 600 MG tablet Take 600 mg by mouth 4 (four) times daily. 08/20/16   Historical Provider, MD  HYDROmorphone (DILAUDID) 2 MG tablet Take 1 tablet (2 mg total) by mouth every 4 (four) hours as needed for severe pain. 12/15/16   Burgess Amor, PA-C  insulin NPH Human (HUMULIN N,NOVOLIN N) 100 UNIT/ML injection Inject 30 Units into the skin 3 (three) times daily.     Historical Provider, MD  lisinopril-hydrochlorothiazide (PRINZIDE,ZESTORETIC) 20-25 MG per tablet Take 1.5 tablets by mouth daily.  12/10/14   Historical Provider, MD  metFORMIN (GLUCOPHAGE-XR) 500 MG 24 hr tablet Take 500-1,000 mg by mouth 2 (two) times daily. Take 2 tablet by mouth in the morning and 1 tablet at bedtime. 08/22/16   Historical Provider, MD  omeprazole (PRILOSEC) 20 MG  capsule Take 1 capsule by mouth 2 (two) times daily. 08/20/16   Historical Provider, MD  oxyCODONE-acetaminophen (PERCOCET/ROXICET) 5-325 MG tablet Take 1-2 tablets by mouth every 4 (four) hours as needed for moderate pain or severe pain. 01/16/17   Trixie Dredge, PA-C  pantoprazole (PROTONIX) 20 MG tablet Take 1 tablet (20 mg total) by mouth daily. Patient not taking: Reported on 09/07/2016 02/09/16   Zadie Rhine, MD  predniSONE (DELTASONE) 20 MG tablet Take 1 tablet by mouth daily. 12/14/16   Historical Provider, MD  VENTOLIN HFA 108 (90 Base) MCG/ACT inhaler Inhale 2 puffs into the lungs every 6  (six) hours as needed for wheezing or shortness of breath.  08/25/16   Historical Provider, MD    Family History No family history on file.  Social History Social History  Substance Use Topics  . Smoking status: Current Every Day Smoker    Packs/day: 0.50    Years: 10.00    Types: Cigarettes  . Smokeless tobacco: Never Used  . Alcohol use 7.2 oz/week    12 Cans of beer per week     Comment: every 2 or 3 days     Allergies   Ibuprofen and Toradol [ketorolac tromethamine]   Review of Systems Review of Systems  Constitutional: Positive for chills. Negative for fever.  HENT: Positive for congestion.   Respiratory: Negative for shortness of breath.   Cardiovascular: Negative for chest pain.  Musculoskeletal: Positive for myalgias.  Neurological: Negative for dizziness, weakness and light-headedness.     Physical Exam Updated Vital Signs BP 155/84 (BP Location: Right Arm)   Pulse 100   Temp 98 F (36.7 C) (Oral)   Resp 20   SpO2 98%   Physical Exam  Constitutional: He appears well-developed and well-nourished.  HENT:  Head: Normocephalic and atraumatic.  Neck: Neck supple.  Cardiovascular: Normal rate and regular rhythm.   Pulmonary/Chest: Effort normal and breath sounds normal.  Lungs clear to auscultation bilaterally.   Musculoskeletal: He exhibits tenderness. He exhibits no edema.  Bilateral knees without erythema, edema or warmth. Right knee: Small localized swelling and tenderness inferiorly and lateral to the patella. Decreased ROM secondary to pain. Distal pulses and sensation intact. Compartments soft.   Neurological: He is alert.  Skin: No erythema.  Nursing note and vitals reviewed.    ED Treatments / Results  DIAGNOSTIC STUDIES:  Oxygen Saturation is 98% on RA, normal by my interpretation.    COORDINATION OF CARE:  10:54 AM Discussed treatment plan with pt at bedside and pt agreed to plan.  Labs (all labs ordered are listed, but only abnormal  results are displayed) Labs Reviewed - No data to display  EKG  EKG Interpretation None       Radiology No results found.  Procedures Procedures (including critical care time)  Medications Ordered in ED Medications  dexamethasone (DECADRON) injection 10 mg (10 mg Intramuscular Given 01/16/17 1124)  oxyCODONE-acetaminophen (PERCOCET/ROXICET) 5-325 MG per tablet 2 tablet (2 tablets Oral Given 01/16/17 1124)     Initial Impression / Assessment and Plan / ED Course  I have reviewed the triage vital signs and the nursing notes.  Pertinent labs & imaging results that were available during my care of the patient were reviewed by me and considered in my medical decision making (see chart for details).  Clinical Course as of Jan 17 1200  Mon Jan 16, 2017  1109 Pt checked on [facility] DEA database.  He was [place] pain [place] monthly prescriptions for percocet 10s until receiving Dilaudid from ED.  States he wasn't on a pain contract but this triggered his doctor to refer him to pain management.  Currently without pain medication for his chronic back pain.   [EW]    Clinical Course User Index [EW] Trixie DredgeEmily Tyron Manetta, PA-C   Afebrile, nontoxic patient with exacerbation of typical gout pain.  No red flags with history or exam.  Neurovascularly intact.  Emergent imaging not indicated at this time.  No injury.  No fevers.  No change in his typical symptoms with gout.  Pt recently placed on steroids for same and had elevated blood sugars.  He is diabetic.  He is not supposed to take NSAIDs due to GI bleeding.  Pt did start himself on colchicine and this does seem like the best treatment option currently.  I engaged in joint medical decision making with the patient regarding the options and he requested an injection of steroid and with monitor his blood sugars closely, will adhere to strict diabetic diet, will continue colchicine at home and I will provide pain medication.   Please see ED course note above regarding DEA database. D/C home with percocet, PCP follow up.  Discussed result, findings, treatment, and follow up  with patient.  Pt given return precautions.  Pt verbalizes understanding and agrees with plan.         Final Clinical Impressions(s) / ED Diagnoses   Final diagnoses:  Acute gout of right knee, unspecified cause    New Prescriptions Discharge Medication List as of 01/16/2017 11:09 AM    START taking these medications   Details  oxyCODONE-acetaminophen (PERCOCET/ROXICET) 5-325 MG tablet Take 1-2 tablets by mouth every 4 (four) hours as needed for moderate pain or severe pain., Starting Mon 01/16/2017, Print      I personally performed the services described in this documentation, which was scribed in my presence. The recorded information has been reviewed and is accurate.     Trixie Dredgemily Amelda Hapke, PA-C 01/16/17 1201    Loren Raceravid Yelverton, MD 01/19/17 [phone]

## 2017-01-16 NOTE — ED Triage Notes (Signed)
Pt complaint of gout flare last night to right knee unrelieved by home medication.

## 2017-03-07 ENCOUNTER — Encounter (HOSPITAL_COMMUNITY): Payer: Self-pay | Admitting: Emergency Medicine

## 2017-03-07 ENCOUNTER — Emergency Department (HOSPITAL_COMMUNITY)
Admission: EM | Admit: 2017-03-07 | Discharge: 2017-03-07 | Disposition: A | Discharge status: Home / Self Care | Payer: Medicare Other | Attending: Emergency Medicine | Admitting: Emergency Medicine

## 2017-03-07 DIAGNOSIS — M10071 Idiopathic gout, right ankle and foot: Secondary | ICD-10-CM | POA: Insufficient documentation

## 2017-03-07 DIAGNOSIS — Z794 Long term (current) use of insulin: Secondary | ICD-10-CM | POA: Insufficient documentation

## 2017-03-07 DIAGNOSIS — Z79899 Other long term (current) drug therapy: Secondary | ICD-10-CM | POA: Diagnosis not present

## 2017-03-07 DIAGNOSIS — F17210 Nicotine dependence, cigarettes, uncomplicated: Secondary | ICD-10-CM | POA: Diagnosis not present

## 2017-03-07 DIAGNOSIS — R110 Nausea: Secondary | ICD-10-CM | POA: Diagnosis not present

## 2017-03-07 DIAGNOSIS — R300 Dysuria: Secondary | ICD-10-CM | POA: Diagnosis not present

## 2017-03-07 DIAGNOSIS — E119 Type 2 diabetes mellitus without complications: Secondary | ICD-10-CM | POA: Insufficient documentation

## 2017-03-07 DIAGNOSIS — M25571 Pain in right ankle and joints of right foot: Secondary | ICD-10-CM | POA: Diagnosis present

## 2017-03-07 DIAGNOSIS — R202 Paresthesia of skin: Secondary | ICD-10-CM | POA: Insufficient documentation

## 2017-03-07 MED ORDER — PREDNISONE 10 MG PO TABS: 50.0000 mg | ORAL_TABLET | Freq: Every day | ORAL | 0 refills | Status: DC

## 2017-03-07 MED ORDER — PREDNISONE 50 MG PO TABS
50.0000 mg | ORAL_TABLET | Freq: Once | ORAL | Status: AC
  Administered 2017-03-07: 50 mg via ORAL
  Filled 2017-03-07: qty 1

## 2017-03-07 MED ORDER — NAPROXEN 500 MG PO TABS: 500.0000 mg | ORAL_TABLET | Freq: Two times a day (BID) | ORAL | 0 refills | Status: DC | PRN

## 2017-03-07 MED ORDER — HYDROMORPHONE HCL 1 MG/ML IJ SOLN
1.0000 mg | Freq: Once | INTRAMUSCULAR | Status: AC
  Administered 2017-03-07: 1 mg via INTRAMUSCULAR
  Filled 2017-03-07: qty 1

## 2017-03-07 MED ORDER — HYDROCODONE-ACETAMINOPHEN 5-325 MG PO TABS: 1.0000 | ORAL_TABLET | Freq: Four times a day (QID) | ORAL | 0 refills | Status: DC | PRN

## 2017-03-07 MED ORDER — COLCHICINE 0.6 MG PO TABS: 0.6000 mg | ORAL_TABLET | Freq: Every day | ORAL | 0 refills | Status: DC

## 2017-03-07 NOTE — Discharge Instructions (Signed)
Return for worsening symptoms, including fever, confusion, escalating swelling/redness/pain or any other symptoms concerning to you. Please see your PCP for follow-up.

## 2017-03-07 NOTE — ED Provider Notes (Signed)
[facility]-EMERGENCY DEPT Provider Note   CSN: [id number] Arrival date & time: 03/07/17  1132     History   Chief Complaint Chief Complaint  Patient presents with  . Gout    HPI Thomas Donovan is a 56 y.o. male.  HPI [age] year old male who presents with left shoulder pain and right ankle pain for past 1-2 days. This is the same pain as prior history of gout. Pain recurrent. No dietary changes. Pain reported severe. Ran out of colchicine and took aleve without improvement in symptoms.  No fever or chills. No fall or trauma. Feels tinging over right toes due to pain, but no focal numbness or weakness. Has been nauseated from pain, but denies vomiting.   Past Medical History:  Diagnosis Date  . Chronic back pain   . Cold    recent rx  . Degeneration of lumbar intervertebral disc   . Diabetes mellitus   . GERD (gastroesophageal reflux disease)   . Gout   . History of kidney stones   . Hypertension   . Pneumonia     Patient Active Problem List   Diagnosis Date Noted  . 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 BACK SURGERY    . MAXIMUM ACCESS (MAS)POSTERIOR LUMBAR INTERBODY FUSION (PLIF) 1 LEVEL  11/20/2013   Procedure: FOR MAXIMUM ACCESS (MAS) POSTERIOR LUMBAR INTERBODY FUSION LUMBAR FIVE-SACRAL ONE;  Surgeon: Tia Alertavid S Jones, MD;  Location: [facility];  Service: Neurosurgery;;  FOR MAXIMUM ACCESS (MAS) POSTERIOR LUMBAR INTERBODY FUSION LUMBAR FIVE-SACRAL ONE  . TONSILLECTOMY         Home Medications    Prior to Admission medications   Medication Sig Start Date End Date Taking? Authorizing Provider  amoxicillin (AMOXIL) 500 MG capsule Take 1 capsule by mouth daily. 02/28/17  Yes Historical Provider, MD  colchicine 0.6 MG tablet Take 0.6 mg by mouth daily as needed (gout flare).    Yes Historical Provider, MD  gabapentin (NEURONTIN) 600 MG tablet Take 600 mg by mouth 4 (four) times daily. 08/20/16  Yes Historical Provider,  MD  hydrOXYzine (VISTARIL) 50 MG capsule Take 1 capsule by mouth at bedtime. 02/28/17  Yes Historical Provider, MD  insulin NPH Human (HUMULIN N,NOVOLIN N) 100 UNIT/ML injection Inject 30 Units into the skin 3 (three) times daily.    Yes Historical Provider, MD  lisinopril-hydrochlorothiazide (PRINZIDE,ZESTORETIC) 20-25 MG per tablet Take 1.5 tablets by mouth daily.  12/10/14  Yes Historical Provider, MD  metFORMIN (GLUCOPHAGE-XR) 500 MG 24 hr tablet Take 500-1,000 mg by mouth 2 (two) times daily. Take 2 tablet by mouth in the morning and 1 tablet at bedtime. 08/22/16  Yes Historical Provider, MD  naproxen sodium (ANAPROX) 220 MG tablet Take 440 mg by mouth daily as needed (pain).   Yes Historical Provider, MD  omeprazole (PRILOSEC) 20 MG capsule Take 1 capsule by mouth 2 (two) times daily. 08/20/16  Yes Historical Provider, MD  ramipril (ALTACE) 10 MG capsule Take 1 capsule by mouth daily. 01/25/17  Yes Historical Provider, MD  VENTOLIN HFA 108 (90 Base) MCG/ACT inhaler Inhale 2 puffs into the lungs every 6 (six) hours as needed for wheezing or shortness of breath.  08/25/16  Yes Historical Provider, MD  HYDROmorphone (DILAUDID) 2 MG tablet Take 1 tablet (2 mg total) by mouth every 4 (four) hours as needed for severe pain. Patient not taking: Reported on 03/07/2017 12/15/16   Burgess AmorJulie Idol, PA-C  oxyCODONE-acetaminophen (  PERCOCET/ROXICET) 5-325 MG tablet Take 1-2 tablets by mouth every 4 (four) hours as needed for moderate pain or severe pain. Patient not taking: Reported on 03/07/2017 01/16/17   Trixie Dredge, PA-C  pantoprazole (PROTONIX) 20 MG tablet Take 1 tablet (20 mg total) by mouth daily. Patient not taking: Reported on 09/07/2016 02/09/16   Zadie Rhine, MD  predniSONE (DELTASONE) 20 MG tablet Take 1 tablet by mouth daily. 12/14/16   Historical Provider, MD    Family History No family history on file.  Social History Social History  Substance Use Topics  . Smoking status: Current Every Day Smoker     Packs/day: 0.50    Years: 10.00    Types: Cigarettes  . Smokeless tobacco: Never Used  . Alcohol use 7.2 oz/week    12 Cans of beer per week     Comment: every 2 or 3 days     Allergies   Ibuprofen and Toradol [ketorolac tromethamine]   Review of Systems Review of Systems  Constitutional: Negative for fever.  Respiratory: Negative for shortness of breath.   Cardiovascular: Negative for chest pain.  Gastrointestinal: Positive for nausea. Negative for vomiting.  Genitourinary: Positive for difficulty urinating.  Musculoskeletal: Positive for arthralgias.  Allergic/Immunologic: Negative for immunocompromised state.  Neurological: Positive for numbness (tingling over toes of right foot).  Hematological: Does not bruise/bleed easily.  Psychiatric/Behavioral: Negative for confusion.  All other systems reviewed and are negative.    Physical Exam Updated Vital Signs BP (!) 169/101 (BP Location: Left Arm)   Pulse 96   Temp 97.9 F (36.6 C) (Oral)   Resp 18   Ht 6' (1.829 m)   Wt (!) 309 lb (140.2 kg)   SpO2 98%   BMI 41.91 kg/m   Physical Exam Physical Exam  Nursing note and vitals reviewed. Constitutional: Non-toxic, and in no acute distress Head: Normocephalic and atraumatic.  Mouth/Throat: Oropharynx is clear and moist.  Neck: Normal range of motion. Neck supple.  Cardiovascular: Normal rate and regular rhythm.   Pulmonary/Chest: Effort normal and breath sounds normal.  Abdominal: Soft. There is no tenderness. There is no rebound and no guarding.  Musculoskeletal: focused exam of right ankle shows mild warmth and erythema overlying lateral aspect over lateral malleolus, limited ROM due to pain, mild soft tissue swelling. Left shoulder w/ pain to palpation over superior aspect, no swelling or skin changes, ROM full but with pain Neurological: Alert, no facial droop, fluent speech, moves all extremities symmetrically Skin: Skin is warm and dry.  Psychiatric:  Cooperative   ED Treatments / Results  Labs (all labs ordered are listed, but only abnormal results are displayed) Labs Reviewed - No data to display  EKG  EKG Interpretation None       Radiology No results found.  Procedures Procedures (including critical care time)  Medications Ordered in ED Medications  predniSONE (DELTASONE) tablet 50 mg (not administered)  HYDROmorphone (DILAUDID) injection 1 mg (not administered)     Initial Impression / Assessment and Plan / ED Course  I have reviewed the triage vital signs and the nursing notes.  Pertinent labs & imaging results that were available during my care of the patient were reviewed by me and considered in my medical decision making (see chart for details).     With left shoulder pain and right ankle pain, consistent with prior history of gout. Afebrile, no infectious symptoms and suspicion for septic arthritis low. States he responds better to steroids, and will start a short  burst. With DM, and discuss close monitoring of glucose and correct according to sliding scale which he states he has at home. Will continue anti-inflammatory medications. 5 tabs norco given for initially breakthrough pain. [place] CSRS reviewed. With short courses of narcotics over past 2-3 months for when he has been seen with acute gout. Does look like clinically he has gout over right ankle today.   Strict return and follow-up instructions reviewed. He expressed understanding of all discharge instructions and felt comfortable with the plan of care.   Final Clinical Impressions(s) / ED Diagnoses   Final diagnoses:  Acute idiopathic gout of right ankle    New Prescriptions New Prescriptions   No medications on file     Lavera Guise, MD 03/07/17 1432

## 2017-03-07 NOTE — ED Notes (Signed)
Patient to front desk stating he is in a lot of pain and wants to know how long it will be. Informed patient regarding reasons for delays.  Patient verbalizes understanding.

## 2017-03-07 NOTE — ED Triage Notes (Signed)
Patient c/o "gout" in right ankle and left shoulder. Patient states hx of gout. Per patient painful to touch and swelling in ankle. Patient states reports taking colchicine with no relief.

## 2017-03-16 ENCOUNTER — Emergency Department (HOSPITAL_COMMUNITY): Payer: Medicare Other

## 2017-03-16 ENCOUNTER — Encounter (HOSPITAL_COMMUNITY): Payer: Self-pay | Admitting: Emergency Medicine

## 2017-03-16 ENCOUNTER — Emergency Department (HOSPITAL_COMMUNITY)
Admission: EM | Admit: 2017-03-16 | Discharge: 2017-03-16 | Disposition: A | Discharge status: Home / Self Care | Payer: Medicare Other | Attending: Emergency Medicine | Admitting: Emergency Medicine

## 2017-03-16 DIAGNOSIS — R739 Hyperglycemia, unspecified: Secondary | ICD-10-CM

## 2017-03-16 DIAGNOSIS — R39198 Other difficulties with micturition: Secondary | ICD-10-CM | POA: Diagnosis not present

## 2017-03-16 DIAGNOSIS — Z794 Long term (current) use of insulin: Secondary | ICD-10-CM | POA: Insufficient documentation

## 2017-03-16 DIAGNOSIS — I10 Essential (primary) hypertension: Secondary | ICD-10-CM | POA: Diagnosis not present

## 2017-03-16 DIAGNOSIS — F17210 Nicotine dependence, cigarettes, uncomplicated: Secondary | ICD-10-CM | POA: Insufficient documentation

## 2017-03-16 DIAGNOSIS — R109 Unspecified abdominal pain: Secondary | ICD-10-CM | POA: Insufficient documentation

## 2017-03-16 DIAGNOSIS — Z79899 Other long term (current) drug therapy: Secondary | ICD-10-CM | POA: Diagnosis not present

## 2017-03-16 DIAGNOSIS — R110 Nausea: Secondary | ICD-10-CM | POA: Diagnosis not present

## 2017-03-16 DIAGNOSIS — E1165 Type 2 diabetes mellitus with hyperglycemia: Secondary | ICD-10-CM | POA: Diagnosis present

## 2017-03-16 DIAGNOSIS — E119 Type 2 diabetes mellitus without complications: Secondary | ICD-10-CM | POA: Insufficient documentation

## 2017-03-16 LAB — URINALYSIS, ROUTINE W REFLEX MICROSCOPIC
BACTERIA UA: NONE SEEN
Bilirubin Urine: NEGATIVE
Glucose, UA: >=500 mg/dL — AB
KETONES UR: NEGATIVE mg/dL
Leukocytes, UA: NEGATIVE
NITRITE: NEGATIVE
PH: 5 (ref 5.0–8.0)
Protein, ur: NEGATIVE mg/dL
Specific Gravity, Urine: 1.028 (ref 1.005–1.030)

## 2017-03-16 LAB — BASIC METABOLIC PANEL
Anion gap: 10 (ref 5–15)
BUN: 16 mg/dL (ref 6–20)
CO2: 28 mmol/L (ref 22–32)
Calcium: 9.3 mg/dL (ref 8.9–10.3)
Chloride: 98 mmol/L — ABNORMAL LOW (ref 101–111)
Creatinine, Ser: 1.1 mg/dL (ref 0.61–1.24)
GFR calc Af Amer: >60 mL/min (ref >60)
GFR calc non Af Amer: >60 mL/min (ref >60)
Glucose, Bld: 349 mg/dL — ABNORMAL HIGH (ref 65–99)
Potassium: 3.3 mmol/L — ABNORMAL LOW (ref 3.5–5.1)
SODIUM: 136 mmol/L (ref 135–145)

## 2017-03-16 LAB — CBC
HCT: 42.9 % (ref 39.0–52.0)
Hemoglobin: 14.1 g/dL (ref 13.0–17.0)
MCH: 29.1 pg (ref 26.0–34.0)
MCHC: 32.9 g/dL (ref 30.0–36.0)
MCV: 88.6 fL (ref 78.0–100.0)
Platelets: 358 10*3/uL (ref 150–400)
RBC: 4.84 MIL/uL (ref 4.22–5.81)
RDW: 15.2 % (ref 11.5–15.5)
WBC: 13.2 10*3/uL — AB (ref 4.0–10.5)

## 2017-03-16 LAB — CBG MONITORING, ED: GLUCOSE-CAPILLARY: 103 mg/dL — AB (ref 65–99)

## 2017-03-16 MED ORDER — ONDANSETRON HCL 4 MG/2ML IJ SOLN
4.0000 mg | Freq: Once | INTRAMUSCULAR | Status: AC
  Administered 2017-03-16: 4 mg via INTRAVENOUS
  Filled 2017-03-16: qty 2

## 2017-03-16 MED ORDER — HYDROMORPHONE HCL 1 MG/ML IJ SOLN
1.0000 mg | Freq: Once | INTRAMUSCULAR | Status: AC
  Administered 2017-03-16: 1 mg via INTRAVENOUS
  Filled 2017-03-16: qty 1

## 2017-03-16 MED ORDER — SODIUM CHLORIDE 0.9 % IV SOLN
INTRAVENOUS | Status: DC
  Administered 2017-03-16: 17:00:00 via INTRAVENOUS

## 2017-03-16 MED ORDER — HYDROCODONE-ACETAMINOPHEN 5-325 MG PO TABS: 1.0000 | ORAL_TABLET | Freq: Four times a day (QID) | ORAL | 0 refills | Status: DC | PRN

## 2017-03-16 MED ORDER — ONDANSETRON 4 MG PO TBDP: 4.0000 mg | ORAL_TABLET | Freq: Three times a day (TID) | ORAL | 1 refills | Status: DC | PRN

## 2017-03-16 MED ORDER — SODIUM CHLORIDE 0.9 % IV BOLUS (SEPSIS)
1000.0000 mL | Freq: Once | INTRAVENOUS | Status: AC
  Administered 2017-03-16: 1000 mL via INTRAVENOUS

## 2017-03-16 MED ORDER — INSULIN ASPART 100 UNIT/ML ~~LOC~~ SOLN
10.0000 [IU] | Freq: Once | SUBCUTANEOUS | Status: AC
  Administered 2017-03-16: 10 [IU] via INTRAVENOUS
  Filled 2017-03-16: qty 1

## 2017-03-16 NOTE — ED Triage Notes (Signed)
Patient c/o bilateral flank pain, more prominent on right side. Per patient nausea, dry heaving, fever, and decrease in urinary flow. Denies any notable blood in urine or diarrhea. Patient does report hx of kidney stones.

## 2017-03-16 NOTE — Discharge Instructions (Signed)
Take the pain medicine as directed. Work note provided. Take Zofran as needed for nausea and vomiting. Rest for the next 2 days. Symptoms are not resolved follow-up with your doctor or return for any new or worse symptoms. Follow your blood sugars carefully.

## 2017-03-16 NOTE — ED Provider Notes (Signed)
[facility] Provider Note   CSN: [id number] Arrival date & time: 03/16/17  1205     History   Chief Complaint Chief Complaint  Patient presents with  . Flank Pain    HPI Thomas Donovan is a 56 y.o. male.  Patient with complaint of abdominal pain flank pain right greater than left. Associated with nausea and dry heaves but no episodic vomiting. Patient also noted decreased urinary flow. No distinct fever. Despite what was mentioned at triage. Noted blood in the urine no diarrhea. She has a former history of kidney stones. Patient is concerned about it being a recurrent kidney stone. No injury or fall. Symptoms are made worse by movement of his right leg. Pain is worse on the right side.      Past Medical History:  Diagnosis Date  . Chronic back pain   . Cold    recent rx  . Degeneration of lumbar intervertebral disc   . Diabetes mellitus   . GERD (gastroesophageal reflux disease)   . Gout   . History of kidney stones   . Hypertension   . Pneumonia     Patient Active Problem List   Diagnosis Date Noted  . 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 BACK SURGERY    . MAXIMUM ACCESS (MAS)POSTERIOR LUMBAR INTERBODY FUSION (PLIF) 1 LEVEL  11/20/2013   Procedure: FOR MAXIMUM ACCESS (MAS) POSTERIOR LUMBAR INTERBODY FUSION LUMBAR FIVE-SACRAL ONE;  Surgeon: Tia Alert, MD;  Location: [facility];  Service: Neurosurgery;;  FOR MAXIMUM ACCESS (MAS) POSTERIOR LUMBAR INTERBODY FUSION LUMBAR FIVE-SACRAL ONE  . TONSILLECTOMY         Home Medications    Prior to Admission medications   Medication Sig Start Date End Date Taking? Authorizing Provider  albuterol (PROVENTIL HFA;VENTOLIN HFA) 108 (90 Base) MCG/ACT inhaler Inhale 1-2 puffs into the lungs every 6 (six) hours as needed for wheezing or shortness of breath.   Yes Historical Provider, MD  colchicine 0.6 MG tablet Take 0.6-1.2 mg by mouth 2 (two) times daily  as needed (for gout flares).   Yes Historical Provider, MD  gabapentin (NEURONTIN) 600 MG tablet Take 600 mg by mouth 4 (four) times daily.   Yes Historical Provider, MD  hydrochlorothiazide (HYDRODIURIL) 25 MG tablet Take 25 mg by mouth daily.   Yes Historical Provider, MD  insulin NPH Human (HUMULIN N,NOVOLIN N) 100 UNIT/ML injection Inject 30 Units into the skin 3 (three) times daily.    Yes Historical Provider, MD  metFORMIN (GLUCOPHAGE-XR) 500 MG 24 hr tablet Take 500-1,000 mg by mouth 2 (two) times daily. Pt takes two tablets in the morning and one at bedtime.   Yes Historical Provider, MD  omeprazole (PRILOSEC) 20 MG capsule Take 20 mg by mouth 2 (two) times daily before a meal.   Yes Historical Provider, MD  HYDROcodone-acetaminophen (NORCO/VICODIN) 5-325 MG tablet Take 1-2 tablets by mouth every 6 (six) hours as needed. 03/16/17   Vanetta Mulders, MD  ondansetron (ZOFRAN ODT) 4 MG disintegrating tablet Take 1 tablet (4 mg total) by mouth every 8 (eight) hours as needed. 03/16/17   Vanetta Mulders, MD    Family History History reviewed. No pertinent family history.  Social History Social History  Substance Use Topics  . Smoking status: Current Every Day Smoker    Packs/day: 0.50    Years: 10.00    Types: Cigarettes  . Smokeless tobacco: Never Used  . Alcohol  use 7.2 oz/week    12 Cans of beer per week     Comment: every 2 or 3 days     Allergies   Aspirin; Ibuprofen; and Toradol [ketorolac tromethamine]   Review of Systems Review of Systems  Constitutional: Negative for fever.  HENT: Negative for congestion.   Eyes: Negative for redness.  Respiratory: Negative for shortness of breath.   Cardiovascular: Negative for chest pain.  Gastrointestinal: Positive for abdominal pain and nausea. Negative for vomiting.  Genitourinary: Positive for difficulty urinating and flank pain. Negative for dysuria and hematuria.  Musculoskeletal: Negative for back pain.  Skin: Negative for  rash.  Neurological: Negative for headaches.  Psychiatric/Behavioral: Negative for confusion.     Physical Exam Updated Vital Signs BP (!) 143/96   Pulse 81   Temp 97.5 F (36.4 C)   Resp 18   Ht 6' (1.829 m)   Wt (!) 140.2 kg   SpO2 97%   BMI 41.91 kg/m   Physical Exam  Constitutional: He is oriented to person, place, and time. He appears well-developed and well-nourished. No distress.  HENT:  Head: Normocephalic and atraumatic.  Mouth/Throat: Oropharynx is clear and moist.  Eyes: Conjunctivae and EOM are normal. Pupils are equal, round, and reactive to light.  Neck: Normal range of motion.  Cardiovascular: Normal rate, regular rhythm and normal heart sounds.   Pulmonary/Chest: Effort normal and breath sounds normal. No respiratory distress.  Abdominal: Soft. Bowel sounds are normal. There is no tenderness.  Musculoskeletal: Normal range of motion. He exhibits no edema.  Neurological: He is alert and oriented to person, place, and time. No cranial nerve deficit. He exhibits normal muscle tone. Coordination normal.  Skin: Skin is warm.  Nursing note and vitals reviewed.    ED Treatments / Results  Labs (all labs ordered are listed, but only abnormal results are displayed) Labs Reviewed  BASIC METABOLIC PANEL - Abnormal; Notable for the following:       Result Value   Potassium 3.3 (*)    Chloride 98 (*)    Glucose, Bld 349 (*)    All other components within normal limits  CBC - Abnormal; Notable for the following:    WBC 13.2 (*)    All other components within normal limits  URINALYSIS, ROUTINE W REFLEX MICROSCOPIC - Abnormal; Notable for the following:    Glucose, UA >=500 (*)    Hgb urine dipstick SMALL (*)    All other components within normal limits  CBG MONITORING, ED - Abnormal; Notable for the following:    Glucose-Capillary 103 (*)    All other components within normal limits    EKG  EKG Interpretation None       Radiology Ct Renal Stone  Study  Result Date: 03/16/2017 CLINICAL DATA:  Bilateral flank pain right greater than left EXAM: CT ABDOMEN AND PELVIS WITHOUT CONTRAST TECHNIQUE: Multidetector CT imaging of the abdomen and pelvis was performed following the standard protocol without IV contrast. COMPARISON:  10/05/2016 FINDINGS: Lower chest: Lung bases demonstrate no acute consolidation or pleural effusion. Normal heart size. Hepatobiliary: No focal hepatic abnormality. No biliary dilatation. No calcified gallstones. Pancreas: Unremarkable. No pancreatic ductal dilatation or surrounding inflammatory changes. Spleen: Normal in size without focal abnormality. Adrenals/Urinary Tract: Adrenal glands are within normal limits. There is no hydronephrosis. Stable 3 cm cyst lower pole left kidney. There are multiple intrarenal calculi bilaterally. On the right, a lower pole stone measures 6 mm. On the left, there are 2 adjacent  stones measuring 8 mm and 5 mm. There is no evidence for ureteral stone. The bladder is grossly unremarkable. Stomach/Bowel: Stomach is within normal limits. Appendix appears normal. No evidence of bowel wall thickening, distention, or inflammatory changes. Sigmoid colon diverticula disease without acute inflammation Vascular/Lymphatic: Non aneurysmal aorta. No grossly enlarged lymph nodes Reproductive: Slightly enlarged prostate gland with mild mass effect on the posterior bladder. Other: Fat in the inguinal canals.  No free air or free fluid. Musculoskeletal: Status post posterior stabilization rod and screw fixation with interbody disc device at L5-S1. Degenerative changes elsewhere in the thoracolumbar spine. No acute or suspicious bone lesion. IMPRESSION: 1. Bilateral intrarenal calculi. No hydronephrosis or ureteral stone. 2. Sigmoid colon diverticular disease without acute inflammation Electronically Signed   By: Jasmine PangKim  Fujinaga M.D.   On: 03/16/2017 18:17    Procedures Procedures (including critical care  time)  Medications Ordered in ED Medications  0.9 %  sodium chloride infusion ( Intravenous New Bag/Given 03/16/17 1715)  HYDROmorphone (DILAUDID) injection 1 mg (1 mg Intravenous Given 03/16/17 1709)  ondansetron (ZOFRAN) injection 4 mg (4 mg Intravenous Given 03/16/17 1709)  sodium chloride 0.9 % bolus 1,000 mL (1,000 mLs Intravenous New Bag/Given 03/16/17 1710)  insulin aspart (novoLOG) injection 10 Units (10 Units Intravenous Given 03/16/17 1711)  HYDROmorphone (DILAUDID) injection 1 mg (1 mg Intravenous Given 03/16/17 1907)     Initial Impression / Assessment and Plan / ED Course  I have reviewed the triage vital signs and the nursing notes.  Pertinent labs & imaging results that were available during my care of the patient were reviewed by me and considered in my medical decision making (see chart for details).     Patient with right flank pain no evidence of any kidney stones. Patient was just seen for pain complaint felt to be gout on March 13. She was not treated with outpatient narcotic pain medicine at that time. Symptoms today negative for any ureteral stones. May very well be musculoskeletal. CT scan of abdomen without any acute findings. Labs without sniffing and amount is alert he was significant only hyperglycemic improved with 10 units of regular insulin IV. Not acidotic. Patient stable for discharge home. Will treat with a short course of pain medicine antinausea medicine and following up with his doctor.  Final Clinical Impressions(s) / ED Diagnoses   Final diagnoses:  Right flank pain  Hyperglycemia    New Prescriptions New Prescriptions   HYDROCODONE-ACETAMINOPHEN (NORCO/VICODIN) 5-325 MG TABLET    Take 1-2 tablets by mouth every 6 (six) hours as needed.   ONDANSETRON (ZOFRAN ODT) 4 MG DISINTEGRATING TABLET    Take 1 tablet (4 mg total) by mouth every 8 (eight) hours as needed.     Vanetta MuldersScott Rosabella Edgin, MD 03/16/17 [id number]

## 2017-03-23 ENCOUNTER — Emergency Department (HOSPITAL_COMMUNITY)
Admission: EM | Admit: 2017-03-23 | Discharge: 2017-03-23 | Disposition: A | Discharge status: Home / Self Care | Payer: Medicare Other | Attending: Dermatology | Admitting: Dermatology

## 2017-03-23 DIAGNOSIS — Z794 Long term (current) use of insulin: Secondary | ICD-10-CM | POA: Diagnosis not present

## 2017-03-23 DIAGNOSIS — F17210 Nicotine dependence, cigarettes, uncomplicated: Secondary | ICD-10-CM | POA: Insufficient documentation

## 2017-03-23 DIAGNOSIS — E119 Type 2 diabetes mellitus without complications: Secondary | ICD-10-CM | POA: Insufficient documentation

## 2017-03-23 DIAGNOSIS — Z5321 Procedure and treatment not carried out due to patient leaving prior to being seen by health care provider: Secondary | ICD-10-CM | POA: Diagnosis not present

## 2017-03-23 DIAGNOSIS — I10 Essential (primary) hypertension: Secondary | ICD-10-CM | POA: Insufficient documentation

## 2017-03-23 NOTE — ED Notes (Signed)
No answer when called 

## 2017-03-23 NOTE — ED Notes (Signed)
Called no answer

## 2017-03-23 NOTE — ED Notes (Signed)
No answer

## 2017-03-23 NOTE — ED Notes (Signed)
No response when name called.

## 2017-05-02 ENCOUNTER — Encounter (HOSPITAL_COMMUNITY): Payer: Self-pay

## 2017-05-02 ENCOUNTER — Emergency Department (HOSPITAL_COMMUNITY)
Admission: EM | Admit: 2017-05-02 | Discharge: 2017-05-02 | Disposition: A | Discharge status: Home / Self Care | Payer: Medicare Other | Attending: Emergency Medicine | Admitting: Emergency Medicine

## 2017-05-02 DIAGNOSIS — E1165 Type 2 diabetes mellitus with hyperglycemia: Secondary | ICD-10-CM | POA: Insufficient documentation

## 2017-05-02 DIAGNOSIS — M25562 Pain in left knee: Secondary | ICD-10-CM | POA: Insufficient documentation

## 2017-05-02 DIAGNOSIS — F17210 Nicotine dependence, cigarettes, uncomplicated: Secondary | ICD-10-CM | POA: Insufficient documentation

## 2017-05-02 DIAGNOSIS — Z794 Long term (current) use of insulin: Secondary | ICD-10-CM | POA: Diagnosis not present

## 2017-05-02 DIAGNOSIS — I10 Essential (primary) hypertension: Secondary | ICD-10-CM | POA: Diagnosis not present

## 2017-05-02 DIAGNOSIS — Z79899 Other long term (current) drug therapy: Secondary | ICD-10-CM | POA: Insufficient documentation

## 2017-05-02 LAB — CBC WITH DIFFERENTIAL/PLATELET
Basophils Absolute: 0.1 10*3/uL (ref 0.0–0.1)
Basophils Relative: 1 %
EOS ABS: 0.3 10*3/uL (ref 0.0–0.7)
Eosinophils Relative: 3 %
HCT: 38.9 % — ABNORMAL LOW (ref 39.0–52.0)
HEMOGLOBIN: 13.3 g/dL (ref 13.0–17.0)
LYMPHS ABS: 2.6 10*3/uL (ref 0.7–4.0)
Lymphocytes Relative: 27 %
MCH: 30 pg (ref 26.0–34.0)
MCHC: 34.2 g/dL (ref 30.0–36.0)
MCV: 87.6 fL (ref 78.0–100.0)
MONO ABS: 1.1 10*3/uL — AB (ref 0.1–1.0)
MONOS PCT: 12 %
NEUTROS PCT: 57 %
Neutro Abs: 5.4 10*3/uL (ref 1.7–7.7)
PLATELETS: 246 10*3/uL (ref 150–400)
RBC: 4.44 MIL/uL (ref 4.22–5.81)
RDW: 14.8 % (ref 11.5–15.5)
WBC: 9.4 10*3/uL (ref 4.0–10.5)

## 2017-05-02 LAB — COMPREHENSIVE METABOLIC PANEL
ALBUMIN: 3.8 g/dL (ref 3.5–5.0)
ALK PHOS: 108 U/L (ref 38–126)
ALT: 30 U/L (ref 17–63)
AST: 26 U/L (ref 15–41)
Anion gap: 10 (ref 5–15)
BUN: 8 mg/dL (ref 6–20)
CHLORIDE: 100 mmol/L — AB (ref 101–111)
CO2: 25 mmol/L (ref 22–32)
CREATININE: 0.89 mg/dL (ref 0.61–1.24)
Calcium: 8.9 mg/dL (ref 8.9–10.3)
GFR calc non Af Amer: >60 mL/min (ref >60)
GLUCOSE: 288 mg/dL — AB (ref 65–99)
Potassium: 3.8 mmol/L (ref 3.5–5.1)
SODIUM: 135 mmol/L (ref 135–145)
Total Bilirubin: 0.6 mg/dL (ref 0.3–1.2)
Total Protein: 6.9 g/dL (ref 6.5–8.1)

## 2017-05-02 LAB — CBG MONITORING, ED
GLUCOSE-CAPILLARY: 282 mg/dL — AB (ref 65–99)
GLUCOSE-CAPILLARY: 224 mg/dL — AB (ref 65–99)

## 2017-05-02 MED ORDER — HYDROMORPHONE HCL 1 MG/ML IJ SOLN
1.0000 mg | Freq: Once | INTRAMUSCULAR | Status: AC
  Administered 2017-05-02: 1 mg via INTRAVENOUS
  Filled 2017-05-02: qty 1

## 2017-05-02 MED ORDER — SODIUM CHLORIDE 0.9 % IV BOLUS (SEPSIS)
1000.0000 mL | Freq: Once | INTRAVENOUS | Status: AC
  Administered 2017-05-02: 1000 mL via INTRAVENOUS

## 2017-05-02 MED ORDER — PREDNISONE 20 MG PO TABS: ORAL_TABLET | ORAL | 0 refills | Status: DC

## 2017-05-02 MED ORDER — HYDROGEN PEROXIDE 3 % EX SOLN
CUTANEOUS | Status: AC
  Filled 2017-05-02: qty 473

## 2017-05-02 MED ORDER — PREDNISONE 50 MG PO TABS
60.0000 mg | ORAL_TABLET | Freq: Once | ORAL | Status: AC
  Administered 2017-05-02: 16:00:00 60 mg via ORAL
  Filled 2017-05-02: qty 1

## 2017-05-02 MED ORDER — INSULIN ASPART 100 UNIT/ML ~~LOC~~ SOLN
10.0000 [IU] | Freq: Once | SUBCUTANEOUS | Status: AC
  Administered 2017-05-02: 10 [IU] via INTRAVENOUS
  Filled 2017-05-02: qty 1

## 2017-05-02 MED ORDER — OXYCODONE-ACETAMINOPHEN 5-325 MG PO TABS: 1.0000 | ORAL_TABLET | ORAL | 0 refills | Status: DC | PRN

## 2017-05-02 NOTE — ED Triage Notes (Signed)
Ems reports pt c/o r knee pain and swelling since yesterday.  Also told ems his vision was " a little fuzzy" in both eyes for the past couple of hours.  Pt says his vision does that when his sugar goes up.  EMS reports cbg 333.

## 2017-05-02 NOTE — ED Provider Notes (Signed)
[facility] Provider Note   CSN: [id number] Arrival date & time: 05/02/17  1348     History   Chief Complaint Chief Complaint  Patient presents with  . Knee Pain    HPI Thomas Donovan is a 56 y.o. male.   Knee Pain   This is a recurrent problem. The current episode started yesterday. The problem has not changed since onset.The pain is present in the left knee. The quality of the pain is described as aching and sharp. The pain is moderate. He has tried OTC pain medications for the symptoms. The treatment provided mild relief. There has been no history of extremity trauma. Family history is significant for gout.    Past Medical History:  Diagnosis Date  . Chronic back pain   . Cold    recent rx  . Degeneration of lumbar intervertebral disc   . Diabetes mellitus   . GERD (gastroesophageal reflux disease)   . Gout   . History of kidney stones   . Hypertension   . Pneumonia     Patient Active Problem List   Diagnosis Date Noted  . 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 BACK SURGERY    . MAXIMUM ACCESS (MAS)POSTERIOR LUMBAR INTERBODY FUSION (PLIF) 1 LEVEL  11/20/2013   Procedure: FOR MAXIMUM ACCESS (MAS) POSTERIOR LUMBAR INTERBODY FUSION LUMBAR FIVE-SACRAL ONE;  Surgeon: Tia Alert, MD;  Location: [facility];  Service: Neurosurgery;;  FOR MAXIMUM ACCESS (MAS) POSTERIOR LUMBAR INTERBODY FUSION LUMBAR FIVE-SACRAL ONE  . TONSILLECTOMY         Home Medications    Prior to Admission medications   Medication Sig Start Date End Date Taking? Authorizing Provider  albuterol (PROVENTIL HFA;VENTOLIN HFA) 108 (90 Base) MCG/ACT inhaler Inhale 1-2 puffs into the lungs every 6 (six) hours as needed for wheezing or shortness of breath.    [provider]  colchicine 0.6 MG tablet Take 0.6-1.2 mg by mouth 2 (two) times daily as needed (for gout flares).    [provider]  gabapentin (NEURONTIN)  600 MG tablet Take 600 mg by mouth 4 (four) times daily.    [provider]  hydrochlorothiazide (HYDRODIURIL) 25 MG tablet Take 25 mg by mouth daily.    [provider]  HYDROcodone-acetaminophen (NORCO/VICODIN) 5-325 MG tablet Take 1-2 tablets by mouth every 6 (six) hours as needed. 03/16/17   Vanetta Mulders, MD  insulin NPH Human (HUMULIN N,NOVOLIN N) 100 UNIT/ML injection Inject 30 Units into the skin 3 (three) times daily.     [provider]  metFORMIN (GLUCOPHAGE-XR) 500 MG 24 hr tablet Take 500-1,000 mg by mouth 2 (two) times daily. Pt takes two tablets in the morning and one at bedtime.    [provider]  omeprazole (PRILOSEC) 20 MG capsule Take 20 mg by mouth 2 (two) times daily before a meal.    [provider]  ondansetron (ZOFRAN ODT) 4 MG disintegrating tablet Take 1 tablet (4 mg total) by mouth every 8 (eight) hours as needed. 03/16/17   Vanetta Mulders, MD  oxyCODONE-acetaminophen (PERCOCET) 5-325 MG tablet Take 1-2 tablets by mouth every 4 (four) hours as needed. 05/02/17   Dalon Reichart, Barbara Cower, MD  predniSONE (DELTASONE) 20 MG tablet 2 tabs po daily x 4 days 05/02/17   Larue Lightner, Barbara Cower, MD    Family History No family history on file.  Social History Social History  Substance Use Topics  . Smoking status:  Current Every Day Smoker    Packs/day: 0.50    Years: 10.00    Types: Cigarettes  . Smokeless tobacco: Never Used  . Alcohol use 7.2 oz/week    12 Cans of beer per week     Comment: every 2 or 3 days     Allergies   Aspirin; Ibuprofen; and Toradol [ketorolac tromethamine]   Review of Systems Review of Systems  All other systems reviewed and are negative.    Physical Exam Updated Vital Signs BP (!) 189/99 (BP Location: Left Arm)   Pulse 95   Temp 98.5 F (36.9 C) (Axillary)   Resp 18   Ht 6' (1.829 m)   Wt 300 lb (136.1 kg)   SpO2 95%   BMI 40.69 kg/m   Physical Exam  Constitutional: He appears well-developed and  well-nourished.  HENT:  Head: Normocephalic and atraumatic.  Eyes: Conjunctivae and EOM are normal.  Neck: Normal range of motion.  Cardiovascular: Normal rate.   Pulmonary/Chest: Effort normal. No respiratory distress.  Abdominal: Soft. He exhibits no distension.  Musculoskeletal: Normal range of motion. He exhibits edema (left knee without erythema, induration or warmth).  Neurological: He is alert.  Skin: Skin is warm and dry.  Nursing note and vitals reviewed.    ED Treatments / Results  Labs (all labs ordered are listed, but only abnormal results are displayed) Labs Reviewed  COMPREHENSIVE METABOLIC PANEL - Abnormal; Notable for the following:       Result Value   Chloride 100 (*)    Glucose, Bld 288 (*)    All other components within normal limits  CBC WITH DIFFERENTIAL/PLATELET - Abnormal; Notable for the following:    HCT 38.9 (*)    Monocytes Absolute 1.1 (*)    All other components within normal limits  CBG MONITORING, ED - Abnormal; Notable for the following:    Glucose-Capillary 282 (*)    All other components within normal limits  CBG MONITORING, ED - Abnormal; Notable for the following:    Glucose-Capillary 224 (*)    All other components within normal limits    EKG  EKG Interpretation None       Radiology No results found.  Procedures Procedures (including critical care time)  Medications Ordered in ED Medications  hydrogen peroxide 3 % external solution (not administered)  sodium chloride 0.9 % bolus 1,000 mL (0 mLs Intravenous Stopped 05/02/17 1703)  HYDROmorphone (DILAUDID) injection 1 mg (1 mg Intravenous Given 05/02/17 1548)  predniSONE (DELTASONE) tablet 60 mg (60 mg Oral Given 05/02/17 1548)  insulin aspart (novoLOG) injection 10 Units (10 Units Intravenous Given 05/02/17 1548)  HYDROmorphone (DILAUDID) injection 1 mg (1 mg Intravenous Given 05/02/17 1711)     Initial Impression / Assessment and Plan / ED Course  I have reviewed the triage  vital signs and the nursing notes.  Pertinent labs & imaging results that were available during my care of the patient were reviewed by me and considered in my medical decision making (see chart for details).    Likely gout flare. c/w h/o same. Will treat appropriately. Also with hyperglycemia and associated blurry vision so will help with that as well with fluids and insulin.   Suspect gouty flare. cbg improved. Repeat BP elevated, but on BP meds at home, likely related to pain, plan for dc to continue following up with PCP. Vision improved.   Final Clinical Impressions(s) / ED Diagnoses   Final diagnoses:  Acute pain of left knee  New Prescriptions Discharge Medication List as of 05/02/2017  5:00 PM    START taking these medications   Details  oxyCODONE-acetaminophen (PERCOCET) 5-325 MG tablet Take 1-2 tablets by mouth every 4 (four) hours as needed., Starting Tue 05/02/2017, Print    predniSONE (DELTASONE) 20 MG tablet 2 tabs po daily x 4 days, Print         Travin Marik, Barbara CowerJason, MD 05/02/17 1850

## 2017-05-02 NOTE — ED Notes (Signed)
Dr. Clayborne DanaMesner okay with bp at this time.  Pt informed to take bp medication since he had not taken any today.

## 2017-05-02 NOTE — ED Notes (Addendum)
Pt made aware to return if symptoms worsen or if any life threatening symptoms occur.  Discharge instructions gone over by Valora CorporalLeslie Cardwell,rn.

## 2017-05-02 NOTE — ED Notes (Signed)
Pt mentions that his bilateral eyes are fuzzy and this usually happens when sugar elevates, MD notified.

## 2017-08-13 ENCOUNTER — Encounter (HOSPITAL_COMMUNITY): Payer: Self-pay | Admitting: Cardiology

## 2017-08-13 ENCOUNTER — Emergency Department (HOSPITAL_COMMUNITY): Payer: Medicare Other

## 2017-08-13 ENCOUNTER — Emergency Department (HOSPITAL_COMMUNITY)
Admission: EM | Admit: 2017-08-13 | Discharge: 2017-08-13 | Disposition: A | Discharge status: Home / Self Care | Payer: Medicare Other | Attending: Emergency Medicine | Admitting: Emergency Medicine

## 2017-08-13 DIAGNOSIS — Z79899 Other long term (current) drug therapy: Secondary | ICD-10-CM | POA: Diagnosis not present

## 2017-08-13 DIAGNOSIS — F17210 Nicotine dependence, cigarettes, uncomplicated: Secondary | ICD-10-CM | POA: Diagnosis not present

## 2017-08-13 DIAGNOSIS — M109 Gout, unspecified: Secondary | ICD-10-CM | POA: Insufficient documentation

## 2017-08-13 DIAGNOSIS — M25562 Pain in left knee: Secondary | ICD-10-CM | POA: Diagnosis present

## 2017-08-13 DIAGNOSIS — Z794 Long term (current) use of insulin: Secondary | ICD-10-CM | POA: Insufficient documentation

## 2017-08-13 DIAGNOSIS — E119 Type 2 diabetes mellitus without complications: Secondary | ICD-10-CM | POA: Insufficient documentation

## 2017-08-13 DIAGNOSIS — I10 Essential (primary) hypertension: Secondary | ICD-10-CM | POA: Insufficient documentation

## 2017-08-13 MED ORDER — HYDROMORPHONE HCL 1 MG/ML IJ SOLN
1.0000 mg | Freq: Once | INTRAMUSCULAR | Status: AC
  Administered 2017-08-13: 1 mg via INTRAMUSCULAR
  Filled 2017-08-13: qty 1

## 2017-08-13 MED ORDER — PREDNISONE 10 MG PO TABS: 20.0000 mg | ORAL_TABLET | Freq: Every day | ORAL | 0 refills | Status: DC

## 2017-08-13 MED ORDER — ONDANSETRON 4 MG PO TBDP
4.0000 mg | ORAL_TABLET | Freq: Once | ORAL | Status: AC
  Administered 2017-08-13: 4 mg via ORAL
  Filled 2017-08-13: qty 1

## 2017-08-13 MED ORDER — OXYCODONE-ACETAMINOPHEN 5-325 MG PO TABS: 1.0000 | ORAL_TABLET | ORAL | 0 refills | Status: DC | PRN

## 2017-08-13 MED ORDER — OXYCODONE-ACETAMINOPHEN 5-325 MG PO TABS
2.0000 | ORAL_TABLET | Freq: Once | ORAL | Status: AC
  Administered 2017-08-13: 2 via ORAL
  Filled 2017-08-13: qty 2

## 2017-08-13 NOTE — ED Notes (Signed)
Pt requesting pain medication, MD notified.

## 2017-08-13 NOTE — ED Notes (Signed)
Pt transported to [facility]

## 2017-08-13 NOTE — Discharge Instructions (Signed)
Test showed no evidence of a blood clot. Prescription for prednisone and pain medicine. Elevate, ice pack, follow-up your primary care doctor or orthopedic surgeon. Phone number given.

## 2017-08-13 NOTE — ED Triage Notes (Signed)
Left leg pan and swelling since yesterday.  Denies any injury. C/o nausea

## 2017-08-16 NOTE — ED Provider Notes (Signed)
[facility] Provider Note   CSN: [id number] Arrival date & time: 08/13/17  5573     History   Chief Complaint Chief Complaint  Patient presents with  . Leg Pain    HPI Thomas Donovan is a 56 y.o. male.  Patient complains of pain in the left popliteal area for 24 hours with associated leg swelling. Questionable history of gout. No fever, sweats, chills, evidence of abscess or cellulitis. Severity of pain is moderate. Range of motion makes pain worse.      Past Medical History:  Diagnosis Date  . Chronic back pain   . Cold    recent rx  . Degeneration of lumbar intervertebral disc   . Diabetes mellitus   . GERD (gastroesophageal reflux disease)   . Gout   . History of kidney stones   . Hypertension   . Pneumonia     Patient Active Problem List   Diagnosis Date Noted  . 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 BACK SURGERY    . MAXIMUM ACCESS (MAS)POSTERIOR LUMBAR INTERBODY FUSION (PLIF) 1 LEVEL  11/20/2013   Procedure: FOR MAXIMUM ACCESS (MAS) POSTERIOR LUMBAR INTERBODY FUSION LUMBAR FIVE-SACRAL ONE;  Surgeon: Tia Alert, MD;  Location: [facility];  Service: Neurosurgery;;  FOR MAXIMUM ACCESS (MAS) POSTERIOR LUMBAR INTERBODY FUSION LUMBAR FIVE-SACRAL ONE  . TONSILLECTOMY         Home Medications    Prior to Admission medications   Medication Sig Start Date End Date Taking? Authorizing Provider  colchicine 0.6 MG tablet Take 0.6-1.2 mg by mouth 2 (two) times daily as needed (for gout flares).   Yes [provider]  insulin NPH Human (HUMULIN N,NOVOLIN N) 100 UNIT/ML injection Inject 30 Units into the skin 3 (three) times daily.    Yes [provider]  metFORMIN (GLUCOPHAGE-XR) 500 MG 24 hr tablet Take 500-1,000 mg by mouth 2 (two) times daily. Pt takes two tablets in the morning and one at bedtime.   Yes [provider]  omeprazole (PRILOSEC) 20 MG capsule Take 20 mg  by mouth daily.    Yes [provider]  albuterol (PROVENTIL HFA;VENTOLIN HFA) 108 (90 Base) MCG/ACT inhaler Inhale 1-2 puffs into the lungs every 6 (six) hours as needed for wheezing or shortness of breath.    [provider]  HYDROcodone-acetaminophen (NORCO/VICODIN) 5-325 MG tablet Take 1-2 tablets by mouth every 6 (six) hours as needed. Patient not taking: Reported on 08/13/2017 03/16/17   Vanetta Mulders, MD  ondansetron (ZOFRAN ODT) 4 MG disintegrating tablet Take 1 tablet (4 mg total) by mouth every 8 (eight) hours as needed. Patient not taking: Reported on 08/13/2017 03/16/17   Vanetta Mulders, MD  oxyCODONE-acetaminophen (PERCOCET) 5-325 MG tablet Take 1-2 tablets by mouth every 4 (four) hours as needed. 08/13/17   Donnetta Hutching, MD  predniSONE (DELTASONE) 10 MG tablet Take 2 tablets (20 mg total) by mouth daily. 08/13/17   Donnetta Hutching, MD    Family History History reviewed. No pertinent family history.  Social History Social History  Substance Use Topics  . Smoking status: Current Every Day Smoker    Packs/day: 0.50    Years: 10.00    Types: Cigarettes  . Smokeless tobacco: Never Used  . Alcohol use 7.2 oz/week    12 Cans of beer per week     Comment: every 2 or 3 days     Allergies   Aspirin; Ibuprofen;  and Toradol [ketorolac tromethamine]   Review of Systems Review of Systems  All other systems reviewed and are negative.    Physical Exam Updated Vital Signs BP 120/85 (BP Location: Right Arm)   Pulse 89   Temp 97.9 F (36.6 C) (Oral)   Resp 18   Ht 6' (1.829 m)   Wt 131.5 kg (290 lb)   SpO2 96%   BMI 39.33 kg/m   Physical Exam  Constitutional: He is oriented to person, place, and time. He appears well-developed and well-nourished.  HENT:  Head: Normocephalic and atraumatic.  Eyes: Conjunctivae are normal.  Neck: Neck supple.  Cardiovascular: Normal rate and regular rhythm.   Pulmonary/Chest: Effort normal and breath sounds normal.    Abdominal: Soft. Bowel sounds are normal.  Musculoskeletal:  Left knee area: There is some tenderness in the popliteal area. No obvious masses were palpated. Minimal edema surrounding knee.  Neurological: He is alert and oriented to person, place, and time.  Skin: Skin is warm and dry.  Psychiatric: He has a normal mood and affect. His behavior is normal.  Nursing note and vitals reviewed.    ED Treatments / Results  Labs (all labs ordered are listed, but only abnormal results are displayed) Labs Reviewed - No data to display  EKG  EKG Interpretation None       Radiology No results found.  Procedures Procedures (including critical care time)  Medications Ordered in ED Medications  HYDROmorphone (DILAUDID) injection 1 mg (1 mg Intramuscular Given 08/13/17 1005)  ondansetron (ZOFRAN-ODT) disintegrating tablet 4 mg (4 mg Oral Given 08/13/17 1011)  oxyCODONE-acetaminophen (PERCOCET/ROXICET) 5-325 MG per tablet 2 tablet (2 tablets Oral Given 08/13/17 1137)     Initial Impression / Assessment and Plan / ED Course  I have reviewed the triage vital signs and the nursing notes.  Pertinent labs & imaging results that were available during my care of the patient were reviewed by me and considered in my medical decision making (see chart for details).     Ultrasound of left lower extremity shows no evidence of DVT.  No evidence of abscess or cellulitis.  Final Clinical Impressions(s) / ED Diagnoses   Final diagnoses:  Acute pain of left knee    New Prescriptions Discharge Medication List as of 08/13/2017  1:15 PM       Donnetta Hutching, MD 08/16/17 [phone]

## 2017-08-21 ENCOUNTER — Observation Stay (HOSPITAL_COMMUNITY): Payer: Medicare Other

## 2017-08-21 ENCOUNTER — Emergency Department (HOSPITAL_COMMUNITY): Payer: Medicare Other

## 2017-08-21 ENCOUNTER — Observation Stay (HOSPITAL_COMMUNITY)
Admission: EM | Admit: 2017-08-21 | Discharge: 2017-08-23 | Disposition: A | Discharge status: Home / Self Care | Payer: Medicare Other | Attending: Family Medicine | Admitting: Family Medicine

## 2017-08-21 ENCOUNTER — Encounter (HOSPITAL_COMMUNITY): Payer: Self-pay

## 2017-08-21 DIAGNOSIS — R079 Chest pain, unspecified: Secondary | ICD-10-CM

## 2017-08-21 DIAGNOSIS — R0600 Dyspnea, unspecified: Secondary | ICD-10-CM | POA: Diagnosis present

## 2017-08-21 DIAGNOSIS — M79605 Pain in left leg: Secondary | ICD-10-CM | POA: Diagnosis not present

## 2017-08-21 DIAGNOSIS — R55 Syncope and collapse: Principal | ICD-10-CM

## 2017-08-21 DIAGNOSIS — K921 Melena: Secondary | ICD-10-CM | POA: Diagnosis present

## 2017-08-21 DIAGNOSIS — Z79899 Other long term (current) drug therapy: Secondary | ICD-10-CM | POA: Diagnosis not present

## 2017-08-21 DIAGNOSIS — E119 Type 2 diabetes mellitus without complications: Secondary | ICD-10-CM | POA: Insufficient documentation

## 2017-08-21 DIAGNOSIS — R0789 Other chest pain: Secondary | ICD-10-CM | POA: Diagnosis not present

## 2017-08-21 DIAGNOSIS — K219 Gastro-esophageal reflux disease without esophagitis: Secondary | ICD-10-CM | POA: Diagnosis present

## 2017-08-21 DIAGNOSIS — E876 Hypokalemia: Secondary | ICD-10-CM | POA: Diagnosis present

## 2017-08-21 DIAGNOSIS — M25562 Pain in left knee: Secondary | ICD-10-CM

## 2017-08-21 DIAGNOSIS — Z720 Tobacco use: Secondary | ICD-10-CM | POA: Diagnosis present

## 2017-08-21 DIAGNOSIS — R1110 Vomiting, unspecified: Secondary | ICD-10-CM | POA: Diagnosis not present

## 2017-08-21 DIAGNOSIS — E1165 Type 2 diabetes mellitus with hyperglycemia: Secondary | ICD-10-CM | POA: Diagnosis present

## 2017-08-21 DIAGNOSIS — I10 Essential (primary) hypertension: Secondary | ICD-10-CM | POA: Diagnosis not present

## 2017-08-21 DIAGNOSIS — F17210 Nicotine dependence, cigarettes, uncomplicated: Secondary | ICD-10-CM | POA: Insufficient documentation

## 2017-08-21 DIAGNOSIS — Z794 Long term (current) use of insulin: Secondary | ICD-10-CM | POA: Insufficient documentation

## 2017-08-21 DIAGNOSIS — R0609 Other forms of dyspnea: Secondary | ICD-10-CM | POA: Insufficient documentation

## 2017-08-21 DIAGNOSIS — E1169 Type 2 diabetes mellitus with other specified complication: Secondary | ICD-10-CM | POA: Diagnosis present

## 2017-08-21 LAB — CBC
HEMATOCRIT: 41.4 % (ref 39.0–52.0)
HEMOGLOBIN: 13.9 g/dL (ref 13.0–17.0)
MCH: 30.8 pg (ref 26.0–34.0)
MCHC: 33.6 g/dL (ref 30.0–36.0)
MCV: 91.8 fL (ref 78.0–100.0)
PLATELETS: 254 10*3/uL (ref 150–400)
RBC: 4.51 MIL/uL (ref 4.22–5.81)
RDW: 14.1 % (ref 11.5–15.5)
WBC: 9.9 10*3/uL (ref 4.0–10.5)

## 2017-08-21 LAB — HEPATIC FUNCTION PANEL
ALBUMIN: 3.8 g/dL (ref 3.5–5.0)
ALT: 67 U/L — ABNORMAL HIGH (ref 17–63)
AST: 49 U/L — ABNORMAL HIGH (ref 15–41)
Alkaline Phosphatase: 85 U/L (ref 38–126)
BILIRUBIN INDIRECT: 0.3 mg/dL (ref 0.3–0.9)
BILIRUBIN TOTAL: 0.4 mg/dL (ref 0.3–1.2)
Bilirubin, Direct: 0.1 mg/dL (ref 0.1–0.5)
TOTAL PROTEIN: 6.4 g/dL — AB (ref 6.5–8.1)

## 2017-08-21 LAB — BASIC METABOLIC PANEL
Anion gap: 10 (ref 5–15)
BUN: 7 mg/dL (ref 6–20)
CHLORIDE: 107 mmol/L (ref 101–111)
CO2: 26 mmol/L (ref 22–32)
Calcium: 9 mg/dL (ref 8.9–10.3)
Creatinine, Ser: 0.91 mg/dL (ref 0.61–1.24)
GFR calc non Af Amer: >60 mL/min (ref >60)
Glucose, Bld: 121 mg/dL — ABNORMAL HIGH (ref 65–99)
POTASSIUM: 3.2 mmol/L — AB (ref 3.5–5.1)
SODIUM: 143 mmol/L (ref 135–145)

## 2017-08-21 LAB — TROPONIN I: Troponin I: <0.03 ng/mL (ref <0.03)

## 2017-08-21 MED ORDER — POTASSIUM CHLORIDE 10 MEQ/100ML IV SOLN
10.0000 meq | INTRAVENOUS | Status: AC
  Administered 2017-08-22 (×3): 10 meq via INTRAVENOUS
  Filled 2017-08-21 (×3): qty 100

## 2017-08-21 MED ORDER — SODIUM CHLORIDE 0.9 % IV BOLUS (SEPSIS)
1000.0000 mL | Freq: Once | INTRAVENOUS | Status: AC
  Administered 2017-08-21: 1000 mL via INTRAVENOUS

## 2017-08-21 MED ORDER — SODIUM CHLORIDE 0.9% FLUSH
3.0000 mL | Freq: Two times a day (BID) | INTRAVENOUS | Status: DC
  Administered 2017-08-22 – 2017-08-23 (×4): 3 mL via INTRAVENOUS

## 2017-08-21 MED ORDER — ONDANSETRON HCL 4 MG PO TABS: 4.0000 mg | ORAL_TABLET | Freq: Four times a day (QID) | ORAL | Status: DC | PRN

## 2017-08-21 MED ORDER — INSULIN ASPART 100 UNIT/ML ~~LOC~~ SOLN
0.0000 [IU] | Freq: Three times a day (TID) | SUBCUTANEOUS | Status: DC
  Administered 2017-08-22 – 2017-08-23 (×3): 3 [IU] via SUBCUTANEOUS
  Administered 2017-08-23: 5 [IU] via SUBCUTANEOUS

## 2017-08-21 MED ORDER — IOPAMIDOL (ISOVUE-370) INJECTION 76%
100.0000 mL | Freq: Once | INTRAVENOUS | Status: AC | PRN
  Administered 2017-08-21: 100 mL via INTRAVENOUS

## 2017-08-21 MED ORDER — ONDANSETRON HCL 4 MG/2ML IJ SOLN
4.0000 mg | Freq: Four times a day (QID) | INTRAMUSCULAR | Status: DC | PRN
  Administered 2017-08-22: 4 mg via INTRAVENOUS
  Filled 2017-08-21: qty 2

## 2017-08-21 MED ORDER — MORPHINE SULFATE (PF) 4 MG/ML IV SOLN
6.0000 mg | Freq: Once | INTRAVENOUS | Status: AC
  Administered 2017-08-21: 6 mg via INTRAVENOUS
  Filled 2017-08-21: qty 2

## 2017-08-21 MED ORDER — ONDANSETRON HCL 4 MG/2ML IJ SOLN
4.0000 mg | Freq: Once | INTRAMUSCULAR | Status: AC
  Administered 2017-08-21: 4 mg via INTRAVENOUS
  Filled 2017-08-21: qty 2

## 2017-08-21 MED ORDER — PANTOPRAZOLE SODIUM 40 MG IV SOLR
40.0000 mg | Freq: Once | INTRAVENOUS | Status: AC
  Administered 2017-08-21: 40 mg via INTRAVENOUS
  Filled 2017-08-21: qty 40

## 2017-08-21 MED ORDER — OXYCODONE-ACETAMINOPHEN 5-325 MG PO TABS
1.0000 | ORAL_TABLET | Freq: Once | ORAL | Status: AC
  Administered 2017-08-21: 1 via ORAL
  Filled 2017-08-21: qty 1

## 2017-08-21 MED ORDER — SODIUM CHLORIDE 0.9% FLUSH: 3.0000 mL | INTRAVENOUS | Status: DC | PRN

## 2017-08-21 MED ORDER — MORPHINE SULFATE (PF) 2 MG/ML IV SOLN
1.0000 mg | INTRAVENOUS | Status: DC | PRN
  Administered 2017-08-22 – 2017-08-23 (×9): 1 mg via INTRAVENOUS
  Filled 2017-08-21 (×10): qty 1

## 2017-08-21 MED ORDER — PANTOPRAZOLE SODIUM 40 MG IV SOLR
40.0000 mg | Freq: Two times a day (BID) | INTRAVENOUS | Status: DC
  Administered 2017-08-22 – 2017-08-23 (×3): 40 mg via INTRAVENOUS
  Filled 2017-08-21 (×3): qty 40

## 2017-08-21 MED ORDER — SODIUM CHLORIDE 0.9 % IV SOLN
INTRAVENOUS | Status: DC
  Administered 2017-08-22: via INTRAVENOUS

## 2017-08-21 NOTE — ED Notes (Signed)
Pt back from CT

## 2017-08-21 NOTE — ED Notes (Signed)
Pt sats are 94& RA, but started on 2L [place] due to pt stating he is SOB

## 2017-08-21 NOTE — ED Notes (Signed)
Called Phlebotomy about coming to draw patient's blood work since patient came in with an IV

## 2017-08-21 NOTE — ED Notes (Signed)
Pt back from X-ray.  

## 2017-08-21 NOTE — ED Triage Notes (Addendum)
Pt found on ground by EMS by mailbox. Pt states the chest pain hit him and he fell on the ground. That's all he remembered.   Pt complaining of overall generalized pain, but especially in left chest area that radiates to left arm. Has been vomiting up blood and having blood in stool for the last 3 days. Pt is also complaining of SOB upon entering ED. Expiratory wheezes heard upon assessment.   VSS NAD History of DM, Gout, Hypertension, and GI Bleeds.

## 2017-08-21 NOTE — H&P (Signed)
TRH H&P    Patient Demographics:    Thomas Donovan, is a 56 y.o. male  MRN: [id number]  DOB - 06-Nov-1961  Admit Date - 08/21/2017  Referring MD/NP/PA: Dr. Estell Harpin  Outpatient Primary MD for the patient is Margo Common Oley Balm., MD  Patient coming from: Home  Chief Complaint  Patient presents with  . Chest Pain  . Shortness of Breath      HPI:    Thomas Donovan  is a 56 y.o. male, With history of chronic back pain, diabetes mellitus, Gout, hypertension, GERD who was seen in the ED on 08/13/2017 with left leg swelling. At that time venous Doppler ultrasound of the lower extremity showed no DVT. Patient was discharged home on prednisone for possible gout.  Patient says that he has been passing out frequently for past 1 week. He describes it as blackouts. He says that he becomes dizzy with a feeling of passing out and then finds himself on the floor. He denies seizure activity. He also complains of chest pain which radiates to his jaw. Also complains of shortness of breath becomes worse on standing. Patient also has been completing of nausea and vomiting along with diarrhea. Patient says that he had four bloody  bowel movements today, he also admits to having vomited black colored vomitus.  In the ED, CT head was negative, chest x-ray shows no acute abnormality. Lab work showed hypokalemia with potassium of 3.2.      Review of systems:    In addition to the HPI above,  No Fever-chills, No Headache, No changes with Vision or hearing, No problems swallowing food or Liquids, + Chest pain,  No Cough  + Abdominal pain,  + Blood in stool  No dysuria, No new skin rashes or bruises, No new joints pains-aches,  No new weakness, tingling, numbness in any extremity, No polyuria, polydypsia or polyphagia, No significant Mental Stressors.  All other systems reviewed and are negative.   With Past History of the  following :    Past Medical History:  Diagnosis Date  . Chronic back pain   . Cold    recent rx  . Degeneration of lumbar intervertebral disc   . Diabetes mellitus   . GERD (gastroesophageal reflux disease)   . Gout   . History of kidney stones   . Hypertension   . Pneumonia       Past Surgical History:  Procedure Laterality Date  . BACK SURGERY    . MAXIMUM ACCESS (MAS)POSTERIOR LUMBAR INTERBODY FUSION (PLIF) 1 LEVEL  11/20/2013   Procedure: FOR MAXIMUM ACCESS (MAS) POSTERIOR LUMBAR INTERBODY FUSION LUMBAR FIVE-SACRAL ONE;  Surgeon: Tia Alert, MD;  Location: [facility];  Service: Neurosurgery;;  FOR MAXIMUM ACCESS (MAS) POSTERIOR LUMBAR INTERBODY FUSION LUMBAR FIVE-SACRAL ONE  . TONSILLECTOMY        Social History:      Social History  Substance Use Topics  . Smoking status: Current Every Day Smoker    Packs/day: 0.50    Years: 10.00    Types: Cigarettes  .  Smokeless tobacco: Never Used  . Alcohol use 7.2 oz/week    12 Cans of beer per week     Comment: every 2 or 3 days       Family History :   No family history of cancer   Home Medications:   Prior to Admission medications   Medication Sig Start Date End Date Taking? Authorizing Provider  colchicine 0.6 MG tablet Take 0.6-1.2 mg by mouth 2 (two) times daily as needed (for gout flares).   Yes [provider]  insulin NPH Human (HUMULIN N,NOVOLIN N) 100 UNIT/ML injection Inject 30 Units into the skin 3 (three) times daily.    Yes [provider]  metFORMIN (GLUCOPHAGE-XR) 500 MG 24 hr tablet Take 500-1,000 mg by mouth 2 (two) times daily. Pt takes two tablets in the morning and one at bedtime.   Yes [provider]  omeprazole (PRILOSEC) 20 MG capsule Take 20 mg by mouth daily.    Yes [provider]  albuterol (PROVENTIL HFA;VENTOLIN HFA) 108 (90 Base) MCG/ACT inhaler Inhale 1-2 puffs into the lungs every 6 (six) hours as needed for wheezing or shortness of breath.     [provider]  oxyCODONE-acetaminophen (PERCOCET) 5-325 MG tablet Take 1-2 tablets by mouth every 4 (four) hours as needed. Patient not taking: Reported on 08/21/2017 08/13/17   Donnetta Hutching, MD  predniSONE (DELTASONE) 10 MG tablet Take 2 tablets (20 mg total) by mouth daily. Patient not taking: Reported on 08/21/2017 08/13/17   Donnetta Hutching, MD     Allergies:     Allergies  Allergen Reactions  . Aspirin Other (See Comments)    Medication is contraindicated with pts gout medicine.    . Ibuprofen Other (See Comments)    Reaction:  GI bleeding   . Toradol [Ketorolac Tromethamine] Other (See Comments)    Reaction:  Blisters between fingers   . Tramadol Hives          Physical Exam:   Vitals  Blood pressure 108/62, pulse 91, resp. rate 18, height 6' (1.829 m), weight 131.5 kg (290 lb), SpO2 94 %.  1.  General: Appears in no acute distress  2. Psychiatric:  Intact judgement and  insight, awake alert, oriented x 3.  3. Neurologic: No focal neurological deficits, all cranial nerves intact.Strength 5/5 all 4 extremities, sensation intact all 4 extremities, plantars down going.  4. Eyes :  anicteric sclerae, moist conjunctivae with no lid lag. PERRLA.  5. ENMT:  Oropharynx clear with moist mucous membranes and good dentition  6. Neck:  supple, no cervical lymphadenopathy appriciated, No thyromegaly  7. Respiratory : Normal respiratory effort, good air movement bilaterally,clear to  auscultation bilaterally  8. Cardiovascular : RRR, no gallops, rubs or murmurs, no leg edema  9. Gastrointestinal:  Positive bowel sounds, abdomen soft, non-tender to palpation,no hepatosplenomegaly, no rigidity or guarding       10. Skin:  No cyanosis, normal texture and turgor, no rash, lesions or ulcers  11.Musculoskeletal:  Good muscle tone,  joints appear normal , no effusions,  normal range of motion    Data Review:    CBC  Recent Labs Lab 08/21/17 1832  WBC 9.9  HGB  13.9  HCT 41.4  PLT 254  MCV 91.8  MCH 30.8  MCHC 33.6  RDW 14.1   ------------------------------------------------------------------------------------------------------------------  Chemistries   Recent Labs Lab 08/21/17 1832  NA 143  K 3.2*  CL 107  CO2 26  GLUCOSE 121*  BUN 7  CREATININE 0.91  CALCIUM 9.0  AST 49*  ALT 67*  ALKPHOS 85  BILITOT 0.4   ------------------------------------------------------------------------------------------------------------------  ------------------------------------------------------------------------------------------------------------------ GFR: Estimated Creatinine Clearance: 128.7 mL/min (by C-G formula based on SCr of 0.91 mg/dL). Liver Function Tests:  Recent Labs Lab 08/21/17 1832  AST 49*  ALT 67*  ALKPHOS 85  BILITOT 0.4  PROT 6.4*  ALBUMIN 3.8   No results for input(s): LIPASE, AMYLASE in the last 168 hours. No results for input(s): AMMONIA in the last 168 hours. Coagulation Profile: No results for input(s): INR, PROTIME in the last 168 hours. Cardiac Enzymes:  Recent Labs Lab 08/21/17 1832  TROPONINI <0.03   BNP (last 3 results) No results for input(s): PROBNP in the last 8760 hours. HbA1C: No results for input(s): HGBA1C in the last 72 hours. CBG: No results for input(s): GLUCAP in the last 168 hours. Lipid Profile: No results for input(s): CHOL, HDL, LDLCALC, TRIG, CHOLHDL, LDLDIRECT in the last 72 hours. Thyroid Function Tests: No results for input(s): TSH, T4TOTAL, FREET4, T3FREE, THYROIDAB in the last 72 hours. Anemia Panel: No results for input(s): VITAMINB12, FOLATE, FERRITIN, TIBC, IRON, RETICCTPCT in the last 72 hours.  ---------------------------------------------------------------------------------------------------------------    Imaging Results:    Dg Chest 2 View  Result Date: 08/21/2017 CLINICAL DATA:  Chest pain, syncopal episodes over the last few days, history hypertension,  diabetes mellitus EXAM: CHEST  2 VIEW COMPARISON:  02/19/2016 FINDINGS: Normal heart size, mediastinal contours, and pulmonary vascularity. Lungs clear. No pleural effusion or pneumothorax. 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 IMPRESSION: No acute abnormalities. Electronically Signed   By: Ulyses Southward M.D.   On: 08/21/2017 19:35   Ct Head Wo Contrast  Result Date: 08/21/2017 CLINICAL DATA:  56 year old male with fall and loss of consciousness today. EXAM: CT HEAD WITHOUT CONTRAST TECHNIQUE: Contiguous axial images were obtained from the base of the skull through the vertex without intravenous contrast. COMPARISON:  02/22/2016 01/06/2005 head CT FINDINGS: Brain: No evidence of acute infarction, hemorrhage, hydrocephalus, extra-axial collection or mass lesion/mass effect. Vascular: No hyperdense vessel or unexpected calcification. Skull: Normal. Negative for fracture or focal lesion. Sinuses/Orbits: No acute finding. Other: None. IMPRESSION: Unremarkable noncontrast head CT. Electronically Signed   By: Harmon Pier M.D.   On: 08/21/2017 22:16    My personal review of EKG: Rhythm NSR   Assessment & Plan:    Active Problems:   Syncope   Chest pain   Dyspnea  Patient is presenting with multiple complaints  1. Syncope- no clear etiology, will monitor on telemetry. Check echocardiogram in a.m. Cycle troponin every 6 hours 3. 2. Chest pain-patient complains of left-sided chest pain with radiation to jaw and back, EKG showed no significant changes. Troponin is negative in the ED. We'll cycle troponin every 6 hours 3. 3. Dyspnea on exertion-patient complains of severe dyspnea on standing and exertion, followed by syncope. History is concerning for PE though Doppler ultrasound of left lower extremity was negative in the ED on 08/13/2017. We'll obtain CTA chest to rule out pulmonary embolism. 4. Hematochezia- patient complains of multiple bloody bowel movements for past 1 week,  rectal exam done by ED physician did not yield any stool. Will check stool for occult blood 3. Hemoglobin is stable at 13.9, which is in fact better from previous hemoglobin 13.3 from 05/02/2017. Check CBC in a.m. 5. Vomiting-patient complains of black colored vomitus, will keep him nothing by mouth. Continue Protonix 40 mg IV every 12 hours. Will monitor. 6. Left  leg pain- patient complains of pain behind popliteal fossa. Venous duplex of left lower extremity was negative for DVT. Will obtain x-ray of left knee. Patient completed the course of prednisone 2 days ago. 7. Diabetes mellitus-will keep patient nothing by mouth, start sliding scale insulin with NovoLog. 8. Hypokalemia- will replace potassium with IV KCl 10 mg 3. Check BMP in a.m.    DVT Prophylaxis-    SCDs   AM Labs Ordered, also please review Full Orders  Family Communication: Admission, patients condition and plan of care including tests being ordered have been discussed with the patient who indicate understanding and agree with the plan and Code Status.  Code Status:  Full code  Admission status: Observation    Time spent in minutes : 60 minutes   Zyliah Schier S M.D on 08/21/2017 at 10:51 PM  Between 7am to 7pm - Pager - [phone]. After 7pm go to [email or web address] - password [facility]  [facility] - Office  [phone]

## 2017-08-22 ENCOUNTER — Encounter (HOSPITAL_COMMUNITY): Payer: Self-pay

## 2017-08-22 ENCOUNTER — Observation Stay (HOSPITAL_BASED_OUTPATIENT_CLINIC_OR_DEPARTMENT_OTHER): Payer: Medicare Other

## 2017-08-22 DIAGNOSIS — R0789 Other chest pain: Secondary | ICD-10-CM | POA: Diagnosis not present

## 2017-08-22 DIAGNOSIS — E1169 Type 2 diabetes mellitus with other specified complication: Secondary | ICD-10-CM

## 2017-08-22 DIAGNOSIS — R55 Syncope and collapse: Secondary | ICD-10-CM

## 2017-08-22 DIAGNOSIS — K219 Gastro-esophageal reflux disease without esophagitis: Secondary | ICD-10-CM | POA: Diagnosis not present

## 2017-08-22 DIAGNOSIS — R079 Chest pain, unspecified: Secondary | ICD-10-CM | POA: Diagnosis not present

## 2017-08-22 DIAGNOSIS — Z720 Tobacco use: Secondary | ICD-10-CM | POA: Diagnosis not present

## 2017-08-22 DIAGNOSIS — E876 Hypokalemia: Secondary | ICD-10-CM | POA: Diagnosis present

## 2017-08-22 DIAGNOSIS — Z794 Long term (current) use of insulin: Secondary | ICD-10-CM | POA: Diagnosis not present

## 2017-08-22 DIAGNOSIS — K921 Melena: Secondary | ICD-10-CM | POA: Diagnosis not present

## 2017-08-22 DIAGNOSIS — E1165 Type 2 diabetes mellitus with hyperglycemia: Secondary | ICD-10-CM | POA: Diagnosis present

## 2017-08-22 LAB — COMPREHENSIVE METABOLIC PANEL
ALBUMIN: 3.5 g/dL (ref 3.5–5.0)
ALK PHOS: 79 U/L (ref 38–126)
ALT: 52 U/L (ref 17–63)
AST: 35 U/L (ref 15–41)
Anion gap: 7 (ref 5–15)
BUN: 7 mg/dL (ref 6–20)
CO2: 27 mmol/L (ref 22–32)
Calcium: 8.1 mg/dL — ABNORMAL LOW (ref 8.9–10.3)
Chloride: 103 mmol/L (ref 101–111)
Creatinine, Ser: 0.77 mg/dL (ref 0.61–1.24)
GFR calc Af Amer: >60 mL/min (ref >60)
GFR calc non Af Amer: >60 mL/min (ref >60)
GLUCOSE: 85 mg/dL (ref 65–99)
POTASSIUM: 3.1 mmol/L — AB (ref 3.5–5.1)
SODIUM: 137 mmol/L (ref 135–145)
TOTAL PROTEIN: 6.1 g/dL — AB (ref 6.5–8.1)
Total Bilirubin: 1 mg/dL (ref 0.3–1.2)

## 2017-08-22 LAB — CBC
HEMATOCRIT: 39.5 % (ref 39.0–52.0)
HEMOGLOBIN: 13.1 g/dL (ref 13.0–17.0)
MCH: 30.7 pg (ref 26.0–34.0)
MCHC: 33.2 g/dL (ref 30.0–36.0)
MCV: 92.5 fL (ref 78.0–100.0)
Platelets: 219 10*3/uL (ref 150–400)
RBC: 4.27 MIL/uL (ref 4.22–5.81)
RDW: 14.1 % (ref 11.5–15.5)
WBC: 9.9 10*3/uL (ref 4.0–10.5)

## 2017-08-22 LAB — GLUCOSE, CAPILLARY
GLUCOSE-CAPILLARY: 73 mg/dL (ref 65–99)
GLUCOSE-CAPILLARY: 90 mg/dL (ref 65–99)
GLUCOSE-CAPILLARY: 215 mg/dL — AB (ref 65–99)
GLUCOSE-CAPILLARY: 228 mg/dL — AB (ref 65–99)
Glucose-Capillary: 71 mg/dL (ref 65–99)

## 2017-08-22 LAB — TSH: TSH: 2.261 u[IU]/mL (ref 0.350–4.500)

## 2017-08-22 LAB — TROPONIN I

## 2017-08-22 LAB — ECHOCARDIOGRAM COMPLETE
Height: 72 in
WEIGHTICAEL: 4640 [oz_av]

## 2017-08-22 LAB — BRAIN NATRIURETIC PEPTIDE: B NATRIURETIC PEPTIDE 5: 35 pg/mL (ref 0.0–100.0)

## 2017-08-22 LAB — HEMOGLOBIN A1C
HEMOGLOBIN A1C: 9.2 % — AB (ref 4.8–5.6)
MEAN PLASMA GLUCOSE: 217.34 mg/dL

## 2017-08-22 MED ORDER — IPRATROPIUM-ALBUTEROL 0.5-2.5 (3) MG/3ML IN SOLN
3.0000 mL | Freq: Four times a day (QID) | RESPIRATORY_TRACT | Status: DC | PRN
Start: 2017-08-22 — End: 2017-08-23

## 2017-08-22 MED ORDER — PERFLUTREN LIPID MICROSPHERE
1.0000 mL | INTRAVENOUS | Status: AC | PRN
  Administered 2017-08-22: 2 mL via INTRAVENOUS
  Filled 2017-08-22: qty 10

## 2017-08-22 MED ORDER — GI COCKTAIL ~~LOC~~
30.0000 mL | Freq: Three times a day (TID) | ORAL | Status: DC | PRN
  Administered 2017-08-22: 30 mL via ORAL
  Filled 2017-08-22: qty 30

## 2017-08-22 MED ORDER — POTASSIUM CHLORIDE CRYS ER 20 MEQ PO TBCR
40.0000 meq | EXTENDED_RELEASE_TABLET | Freq: Once | ORAL | Status: AC
  Administered 2017-08-22: 40 meq via ORAL
  Filled 2017-08-22: qty 2

## 2017-08-22 MED ORDER — OXYCODONE-ACETAMINOPHEN 5-325 MG PO TABS
1.0000 | ORAL_TABLET | ORAL | Status: DC | PRN
  Administered 2017-08-22 – 2017-08-23 (×5): 2 via ORAL
  Administered 2017-08-23: 1 via ORAL
  Filled 2017-08-22 (×6): qty 2

## 2017-08-22 MED ORDER — IPRATROPIUM-ALBUTEROL 0.5-2.5 (3) MG/3ML IN SOLN
3.0000 mL | Freq: Four times a day (QID) | RESPIRATORY_TRACT | Status: DC
  Administered 2017-08-22 (×2): 3 mL via RESPIRATORY_TRACT
  Filled 2017-08-22 (×2): qty 3

## 2017-08-22 MED ORDER — COLCHICINE 0.6 MG PO TABS
0.6000 mg | ORAL_TABLET | Freq: Two times a day (BID) | ORAL | Status: DC
  Administered 2017-08-22 – 2017-08-23 (×3): 0.6 mg via ORAL
  Filled 2017-08-22 (×3): qty 1

## 2017-08-22 MED ORDER — GUAIFENESIN ER 600 MG PO TB12
1200.0000 mg | ORAL_TABLET | Freq: Two times a day (BID) | ORAL | Status: DC
  Administered 2017-08-22 – 2017-08-23 (×3): 1200 mg via ORAL
  Filled 2017-08-22 (×3): qty 2

## 2017-08-22 MED ORDER — GABAPENTIN 300 MG PO CAPS
600.0000 mg | ORAL_CAPSULE | Freq: Three times a day (TID) | ORAL | Status: DC
  Administered 2017-08-22 – 2017-08-23 (×4): 600 mg via ORAL
  Filled 2017-08-22 (×4): qty 2

## 2017-08-22 NOTE — Consult Note (Signed)
Primary Physician: Primary Cardiologist:  New  Asked by Dr Kerry Hough to see for CP and syncope  HPI:  Pt is a 56 yo with history of DM, gout, HTN, GERD  Admitted on 8/26 with CP   SOB Patient says over the past few months increased SOB with activity  SLowed down a lot  The past week got worse  Now having chest discomfort to arm and neck with acktivty   Breaks into sweat Like getting out of shower.SOB with activity Wakes up with chest discomfort wringing in sweat Syncopal spells with standing  Also if walking if pushes selffeels like "going to die"  Squat down to avoid passing out. Has had N/V and diarrhea (bloodY)   CT negatvie for PE    DM x 10 years  Smokes off and on since age 89 FHx   Father and mother with CVA     Past Medical History:  Diagnosis Date  . Chronic back pain   . Cold    recent rx  . Degeneration of lumbar intervertebral disc   . Diabetes mellitus   . GERD (gastroesophageal reflux disease)   . Gout   . History of kidney stones   . Hypertension   . Pneumonia     Medications Prior to Admission  Medication Sig Dispense Refill  . colchicine 0.6 MG tablet Take 0.6-1.2 mg by mouth 2 (two) times daily as needed (for gout flares).    . insulin NPH Human (HUMULIN N,NOVOLIN N) 100 UNIT/ML injection Inject 30 Units into the skin 3 (three) times daily.     . metFORMIN (GLUCOPHAGE-XR) 500 MG 24 hr tablet Take 500-1,000 mg by mouth 2 (two) times daily. Pt takes two tablets in the morning and one at bedtime.    Marland Kitchen omeprazole (PRILOSEC) 20 MG capsule Take 20 mg by mouth daily.     Marland Kitchen albuterol (PROVENTIL HFA;VENTOLIN HFA) 108 (90 Base) MCG/ACT inhaler Inhale 1-2 puffs into the lungs every 6 (six) hours as needed for wheezing or shortness of breath.    . oxyCODONE-acetaminophen (PERCOCET) 5-325 MG tablet Take 1-2 tablets by mouth every 4 (four) hours as needed. (Patient not taking: Reported on 08/21/2017) 6 tablet 0  . predniSONE (DELTASONE) 10 MG tablet Take 2 tablets (20 mg  total) by mouth daily. (Patient not taking: Reported on 08/21/2017) 15 tablet 0     . colchicine  0.6 mg Oral BID  . guaiFENesin  1,200 mg Oral BID  . insulin aspart  0-9 Units Subcutaneous TID WC  . ipratropium-albuterol  3 mL Nebulization Q6H  . pantoprazole (PROTONIX) IV  40 mg Intravenous Q12H  . sodium chloride flush  3 mL Intravenous Q12H    Infusions: . sodium chloride 75 mL/hr at 08/22/17 0027    Allergies  Allergen Reactions  . Aspirin Other (See Comments)    Medication is contraindicated with pts gout medicine.    . Ibuprofen Other (See Comments)    Reaction:  GI bleeding   . Toradol [Ketorolac Tromethamine] Other (See Comments)    Reaction:  Blisters between fingers   . Tramadol Hives         Social History   Social History  . Marital status: Married    Spouse name: N/A  . Number of children: N/A  . Years of education: N/A   Occupational History  . Not on file.   Social History Main Topics  . Smoking status: Current Every Day Smoker    Packs/day: 0.50  Years: 10.00    Types: Cigarettes  . Smokeless tobacco: Never Used  . Alcohol use 7.2 oz/week    12 Cans of beer per week     Comment: every 2 or 3 days  . Drug use: No  . Sexual activity: Yes   Other Topics Concern  . Not on file   Social History Narrative  . No narrative on file    History reviewed. No pertinent family history.  REVIEW OF SYSTEMS:  All systems reviewed  Negative to the above problem except as noted above.    PHYSICAL EXAM: Vitals:   08/22/17 0518 08/22/17 0813  BP: (!) 134/56   Pulse: 79 80  Resp: 16 16  Temp: 98.4 F (36.9 C) 97.7 F (36.5 C)  SpO2: 99% 98%     Intake/Output Summary (Last 24 hours) at 08/22/17 1423 Last data filed at 08/22/17 0744  Gross per 24 hour  Intake             1668 ml  Output              250 ml  Net             1418 ml    General:  Obese 56 yo . No respiratory difficulty HEENT: normal Neck: supple. no JVD. Carotids 2+ bilat;  no bruits. No lymphadenopathy or thryomegaly appreciated. Cor: PMI nondisplaced. Regular rate & rhythm. No rubs, gallops or murmurs. Lungs: clear Abdomen: soft, nontender, nondistended. No hepatosplenomegaly. No bruits or masses. Good bowel sounds. Extremities: no cyanosis, clubbing, rash, edema Neuro: alert & oriented x 3, cranial nerves grossly intact. moves all 4 extremities w/o difficulty. Affect pleasant.  ECG:  SR 93 bpm  Occasinal PAC  Results for orders placed or performed during the hospital encounter of 08/21/17 (from the past 24 hour(s))  Basic metabolic panel     Status: Abnormal   Collection Time: 08/21/17  6:32 PM  Result Value Ref Range   Sodium 143 135 - 145 mmol/L   Potassium 3.2 (L) 3.5 - 5.1 mmol/L   Chloride 107 101 - 111 mmol/L   CO2 26 22 - 32 mmol/L   Glucose, Bld 121 (H) 65 - 99 mg/dL   BUN 7 6 - 20 mg/dL   Creatinine, Ser 1.61 0.61 - 1.24 mg/dL   Calcium 9.0 8.9 - 09.6 mg/dL   GFR calc non Af Amer >60 >60 mL/min   GFR calc Af Amer >60 >60 mL/min   Anion gap 10 5 - 15  CBC     Status: None   Collection Time: 08/21/17  6:32 PM  Result Value Ref Range   WBC 9.9 4.0 - 10.5 K/uL   RBC 4.51 4.22 - 5.81 MIL/uL   Hemoglobin 13.9 13.0 - 17.0 g/dL   HCT 04.5 40.9 - 81.1 %   MCV 91.8 78.0 - 100.0 fL   MCH 30.8 26.0 - 34.0 pg   MCHC 33.6 30.0 - 36.0 g/dL   RDW 91.4 78.2 - 95.6 %   Platelets 254 150 - 400 K/uL  Troponin I     Status: None   Collection Time: 08/21/17  6:32 PM  Result Value Ref Range   Troponin I <0.03 <0.03 ng/mL  Hepatic function panel     Status: Abnormal   Collection Time: 08/21/17  6:32 PM  Result Value Ref Range   Total Protein 6.4 (L) 6.5 - 8.1 g/dL   Albumin 3.8 3.5 - 5.0 g/dL   AST 49 (H)  15 - 41 U/L   ALT 67 (H) 17 - 63 U/L   Alkaline Phosphatase 85 38 - 126 U/L   Total Bilirubin 0.4 0.3 - 1.2 mg/dL   Bilirubin, Direct 0.1 0.1 - 0.5 mg/dL   Indirect Bilirubin 0.3 0.3 - 0.9 mg/dL  TSH     Status: None   Collection Time: 08/21/17   6:32 PM  Result Value Ref Range   TSH 2.261 0.350 - 4.500 uIU/mL  Hemoglobin A1c     Status: Abnormal   Collection Time: 08/21/17  6:32 PM  Result Value Ref Range   Hgb A1c MFr Bld 9.2 (H) 4.8 - 5.6 %   Mean Plasma Glucose 217.34 mg/dL  Glucose, capillary     Status: None   Collection Time: 08/22/17 12:10 AM  Result Value Ref Range   Glucose-Capillary 71 65 - 99 mg/dL   Comment 1 Notify RN    Comment 2 Document in Chart   Troponin I     Status: None   Collection Time: 08/22/17 12:21 AM  Result Value Ref Range   Troponin I <0.03 <0.03 ng/mL  Glucose, capillary     Status: None   Collection Time: 08/22/17  3:28 AM  Result Value Ref Range   Glucose-Capillary 73 65 - 99 mg/dL  Troponin I (q 6hr x 3)     Status: None   Collection Time: 08/22/17  6:39 AM  Result Value Ref Range   Troponin I <0.03 <0.03 ng/mL  CBC     Status: None   Collection Time: 08/22/17  6:39 AM  Result Value Ref Range   WBC 9.9 4.0 - 10.5 K/uL   RBC 4.27 4.22 - 5.81 MIL/uL   Hemoglobin 13.1 13.0 - 17.0 g/dL   HCT 16.1 09.6 - 04.5 %   MCV 92.5 78.0 - 100.0 fL   MCH 30.7 26.0 - 34.0 pg   MCHC 33.2 30.0 - 36.0 g/dL   RDW 40.9 81.1 - 91.4 %   Platelets 219 150 - 400 K/uL  Comprehensive metabolic panel     Status: Abnormal   Collection Time: 08/22/17  6:39 AM  Result Value Ref Range   Sodium 137 135 - 145 mmol/L   Potassium 3.1 (L) 3.5 - 5.1 mmol/L   Chloride 103 101 - 111 mmol/L   CO2 27 22 - 32 mmol/L   Glucose, Bld 85 65 - 99 mg/dL   BUN 7 6 - 20 mg/dL   Creatinine, Ser 7.82 0.61 - 1.24 mg/dL   Calcium 8.1 (L) 8.9 - 10.3 mg/dL   Total Protein 6.1 (L) 6.5 - 8.1 g/dL   Albumin 3.5 3.5 - 5.0 g/dL   AST 35 15 - 41 U/L   ALT 52 17 - 63 U/L   Alkaline Phosphatase 79 38 - 126 U/L   Total Bilirubin 1.0 0.3 - 1.2 mg/dL   GFR calc non Af Amer >60 >60 mL/min   GFR calc Af Amer >60 >60 mL/min   Anion gap 7 5 - 15  Glucose, capillary     Status: None   Collection Time: 08/22/17  7:44 AM  Result Value Ref  Range   Glucose-Capillary 90 65 - 99 mg/dL  Glucose, capillary     Status: Abnormal   Collection Time: 08/22/17 11:57 AM  Result Value Ref Range   Glucose-Capillary 215 (H) 65 - 99 mg/dL  Troponin I (q 6hr x 3)     Status: None   Collection Time: 08/22/17 12:31 PM  Result Value Ref Range   Troponin I <0.03 <0.03 ng/mL   Dg Chest 2 View  Result Date: 08/21/2017 CLINICAL DATA:  Chest pain, syncopal episodes over the last few days, history hypertension, diabetes mellitus EXAM: CHEST  2 VIEW COMPARISON:  02/19/2016 FINDINGS: Normal heart size, mediastinal contours, and pulmonary vascularity. Lungs clear. No pleural effusion or pneumothorax. 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 IMPRESSION: No acute abnormalities. Electronically Signed   By: Ulyses Southward M.D.   On: 08/21/2017 19:35   Ct Head Wo Contrast  Result Date: 08/21/2017 CLINICAL DATA:  56 year old male with fall and loss of consciousness today. EXAM: CT HEAD WITHOUT CONTRAST TECHNIQUE: Contiguous axial images were obtained from the base of the skull through the vertex without intravenous contrast. COMPARISON:  02/22/2016 01/06/2005 head CT FINDINGS: Brain: No evidence of acute infarction, hemorrhage, hydrocephalus, extra-axial collection or mass lesion/mass effect. Vascular: No hyperdense vessel or unexpected calcification. Skull: Normal. Negative for fracture or focal lesion. Sinuses/Orbits: No acute finding. Other: None. IMPRESSION: Unremarkable noncontrast head CT. Electronically Signed   By: Harmon Pier M.D.   On: 08/21/2017 22:16   Ct Angio Chest Pe W Or Wo Contrast  Result Date: 08/22/2017 CLINICAL DATA:  Syncopal episode some shortness of breath EXAM: CT ANGIOGRAPHY CHEST WITH CONTRAST TECHNIQUE: Multidetector CT imaging of the chest was performed using the standard protocol during bolus administration of intravenous contrast. Multiplanar CT image reconstructions and MIPs were obtained to evaluate the vascular  anatomy. CONTRAST:  100 mL Isovue 370 intravenous COMPARISON:  Radiograph 08/21/2017, CT abdomen 03/16/2017 FINDINGS: Cardiovascular: Satisfactory opacification of the pulmonary arteries to the segmental level. No evidence of pulmonary embolism. Normal heart size. No pericardial effusion. Non aneurysmal aorta. No dissection is seen Mediastinum/Nodes: No enlarged mediastinal, hilar, or axillary lymph nodes. Thyroid gland, trachea, and esophagus demonstrate no significant findings. Lungs/Pleura: Lungs are clear. No pleural effusion or pneumothorax. Upper Abdomen: No acute abnormality. Musculoskeletal: Spondylosis and endplate changes in the thoracic spine. No acute or suspicious bone lesion. Review of the MIP images confirms the above findings. IMPRESSION: 1. Negative for acute pulmonary embolus or aortic dissection 2. Clear lung fields Electronically Signed   By: Jasmine Pang M.D.   On: 08/22/2017 00:28   Dg Knee Complete 4 Views Left  Result Date: 08/22/2017 CLINICAL DATA:  Left knee pain EXAM: LEFT KNEE - COMPLETE 4+ VIEW COMPARISON:  None. FINDINGS: No evidence of fracture, dislocation, or joint effusion. No evidence of arthropathy or other focal bone abnormality. Soft tissues are unremarkable. IMPRESSION: Negative. Electronically Signed   By: Deatra Robinson M.D.   On: 08/22/2017 00:38     ASSESSMENT:  Pt is a 56 yo with history of chest discomfort with activity, SOB with activity, near syncope with activity and syncope with standing   He has r/o for MI I have reviewed echo LVEF and RVEF are normal CT neg for PE Also with some diarrhea prior, reported bloody.  Pt not anemic  On exam, pt gets a little dizzy with sitting  Otherwise exam unremarkable   EKG with SR   Echo normal LV and RV function   Concerning for progressive / unstable angina + some orthostasis.  Discussed with pt  Should have eval before d/c Discussed risks/benefits of myovue vs cath   Would recomm L and R heart cath to  define anatomy / pressures Pt would like to review with wife  Would recomm :  Hold IV fluids  Check orthostatics  2  Tob  Counselled on cessation  3  DM    4  Lipids  With DM pt should be on statin  Check lipids in AM

## 2017-08-22 NOTE — Progress Notes (Signed)
PROGRESS NOTE    ALDEAN PIPE  VHQ:[id number] DOB: 09/21/1961 DOA: 08/21/2017 PCP: Louie Boston., MD    Brief Narrative:  56 -year-old with a history of diabetes, GERD, tobacco use, who frequently visits the ER. He's had 4 visits in the past 5 months. He is admitted to the hospital with chest pain and syncope. Etiology is unclear. Certainly GERD could be playing a role. He does have risk factors for coronary disease and has not had any cardiac workup. He is ruled out for ACS with negative cardiac markers. Cardiology has been consulted.   Assessment & Plan:   Active Problems:   Syncope   Chest pain   Dyspnea   Hematochezia   Tobacco use   Hypokalemia   GERD (gastroesophageal reflux disease)   Type 2 diabetes mellitus with other specified complication (HCC)   1. Chest pain. Etiology is unclear. There could be an element related to GERD. He may have some underlying bronchospasms that could be contributing. Pain is described as a tightness in the substernal area and discomfort in his neck and shoulder/left arm. This can be brought on while he is laying down, but it is worse with exertion and is relieved with rest. He does has risk factors for coronary disease and reports that he has not had a stress test in the past. He has ruled out for ACS with negative cardiac markers. Will request cardiology consult to see if this further workup such as stress testing is necessary. 2. Syncope. Possibly related to hypoxia. Orthostatics have been negative. Echocardiogram has been ordered. Patient reports having chest pain and nausea prior to syncope. Question vasovagal event. Will ambulate and check O2 saturations. 3. GERD. Continue on proton pump inhibitors. Use GI cocktail when necessary 4. Hematochezia. Patient reports some blood in stools approximately 2-3 days ago. He's not had any since then. Hemoglobin is currently stable. If recurs, Can consider further GI evaluation 5. Hypokalemia.  Replace 6. Tobacco use. Counseled on the importance of tobacco cessation 7. Dyspnea. With his long-standing tobacco use, suspect that he has some degree of COPD . started on bronchodilators.   DVT prophylaxis: SCDs Code Status: Full code Family Communication: No family present Disposition Plan: Discharge home once improved   Consultants:     Procedures:     Antimicrobials:     Subjective: Continues to complain of chest discomfort. Feels lightheaded on standing. Feels short of breath on exertion.  Objective: Vitals:   08/21/17 2330 08/22/17 0010 08/22/17 0518 08/22/17 0813  BP:   (!) 134/56   Pulse: 86 80 79 80  Resp: 12 16 16 16   Temp:  97.8 F (36.6 C) 98.4 F (36.9 C) 97.7 F (36.5 C)  TempSrc:  Oral Oral Oral  SpO2: 96% 98% 99% 98%  Weight:  131.5 kg (290 lb)    Height:  6' (1.829 m)      Intake/Output Summary (Last 24 hours) at 08/22/17 1134 Last data filed at 08/22/17 0744  Gross per 24 hour  Intake             1668 ml  Output              250 ml  Net             1418 ml   Filed Weights   08/21/17 1802 08/22/17 0010  Weight: 131.5 kg (290 lb) 131.5 kg (290 lb)    Examination:  General exam: Appears calm and comfortable  Respiratory system: mostly  clear bilaterally with mild wheeze. Respiratory effort normal. Cardiovascular system: S1 & S2 heard, RRR. No JVD, murmurs, rubs, gallops or clicks. No pedal edema. Gastrointestinal system: Abdomen is nondistended, soft and nontender. No organomegaly or masses felt. Normal bowel sounds heard. Central nervous system: Alert and oriented. No focal neurological deficits. Extremities: Symmetric 5 x 5 power. Skin: No rashes, lesions or ulcers Psychiatry: Judgement and insight appear normal. Mood & affect appropriate.     Data Reviewed: I have personally reviewed following labs and imaging studies  CBC:  Recent Labs Lab 08/21/17 1832 08/22/17 0639  WBC 9.9 9.9  HGB 13.9 13.1  HCT 41.4 39.5  MCV 91.8  92.5  PLT 254 219   Basic Metabolic Panel:  Recent Labs Lab 08/21/17 1832 08/22/17 0639  NA 143 137  K 3.2* 3.1*  CL 107 103  CO2 26 27  GLUCOSE 121* 85  BUN 7 7  CREATININE 0.91 0.77  CALCIUM 9.0 8.1*   GFR: Estimated Creatinine Clearance: 146.4 mL/min (by C-G formula based on SCr of 0.77 mg/dL). Liver Function Tests:  Recent Labs Lab 08/21/17 1832 08/22/17 0639  AST 49* 35  ALT 67* 52  ALKPHOS 85 79  BILITOT 0.4 1.0  PROT 6.4* 6.1*  ALBUMIN 3.8 3.5   No results for input(s): LIPASE, AMYLASE in the last 168 hours. No results for input(s): AMMONIA in the last 168 hours. Coagulation Profile: No results for input(s): INR, PROTIME in the last 168 hours. Cardiac Enzymes:  Recent Labs Lab 08/21/17 1832 08/22/17 0021 08/22/17 0639  TROPONINI <0.03 <0.03 <0.03   BNP (last 3 results) No results for input(s): PROBNP in the last 8760 hours. HbA1C: No results for input(s): HGBA1C in the last 72 hours. CBG:  Recent Labs Lab 08/22/17 0010 08/22/17 0328 08/22/17 0744  GLUCAP 71 73 90   Lipid Profile: No results for input(s): CHOL, HDL, LDLCALC, TRIG, CHOLHDL, LDLDIRECT in the last 72 hours. Thyroid Function Tests:  Recent Labs  08/21/17 1832  TSH 2.261   Anemia Panel: No results for input(s): VITAMINB12, FOLATE, FERRITIN, TIBC, IRON, RETICCTPCT in the last 72 hours. Sepsis Labs: No results for input(s): PROCALCITON, LATICACIDVEN in the last 168 hours.  No results found for this or any previous visit (from the past 240 hour(s)).       Radiology Studies: Dg Chest 2 View  Result Date: 08/21/2017 CLINICAL DATA:  Chest pain, syncopal episodes over the last few days, history hypertension, diabetes mellitus EXAM: CHEST  2 VIEW COMPARISON:  02/19/2016 FINDINGS: Normal heart size, mediastinal contours, and pulmonary vascularity. Lungs clear. No pleural effusion or pneumothorax. 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 IMPRESSION: No  acute abnormalities. Electronically Signed   By: Ulyses Southward M.D.   On: 08/21/2017 19:35   Ct Head Wo Contrast  Result Date: 08/21/2017 CLINICAL DATA:  56 year old male with fall and loss of consciousness today. EXAM: CT HEAD WITHOUT CONTRAST TECHNIQUE: Contiguous axial images were obtained from the base of the skull through the vertex without intravenous contrast. COMPARISON:  02/22/2016 01/06/2005 head CT FINDINGS: Brain: No evidence of acute infarction, hemorrhage, hydrocephalus, extra-axial collection or mass lesion/mass effect. Vascular: No hyperdense vessel or unexpected calcification. Skull: Normal. Negative for fracture or focal lesion. Sinuses/Orbits: No acute finding. Other: None. IMPRESSION: Unremarkable noncontrast head CT. Electronically Signed   By: Harmon Pier M.D.   On: 08/21/2017 22:16   Ct Angio Chest Pe W Or Wo Contrast  Result Date: 08/22/2017 CLINICAL DATA:  Syncopal episode some  shortness of breath EXAM: CT ANGIOGRAPHY CHEST WITH CONTRAST TECHNIQUE: Multidetector CT imaging of the chest was performed using the standard protocol during bolus administration of intravenous contrast. Multiplanar CT image reconstructions and MIPs were obtained to evaluate the vascular anatomy. CONTRAST:  100 mL Isovue 370 intravenous COMPARISON:  Radiograph 08/21/2017, CT abdomen 03/16/2017 FINDINGS: Cardiovascular: Satisfactory opacification of the pulmonary arteries to the segmental level. No evidence of pulmonary embolism. Normal heart size. No pericardial effusion. Non aneurysmal aorta. No dissection is seen Mediastinum/Nodes: No enlarged mediastinal, hilar, or axillary lymph nodes. Thyroid gland, trachea, and esophagus demonstrate no significant findings. Lungs/Pleura: Lungs are clear. No pleural effusion or pneumothorax. Upper Abdomen: No acute abnormality. Musculoskeletal: Spondylosis and endplate changes in the thoracic spine. No acute or suspicious bone lesion. Review of the MIP images confirms the  above findings. IMPRESSION: 1. Negative for acute pulmonary embolus or aortic dissection 2. Clear lung fields Electronically Signed   By: Jasmine Pang M.D.   On: 08/22/2017 00:28   Dg Knee Complete 4 Views Left  Result Date: 08/22/2017 CLINICAL DATA:  Left knee pain EXAM: LEFT KNEE - COMPLETE 4+ VIEW COMPARISON:  None. FINDINGS: No evidence of fracture, dislocation, or joint effusion. No evidence of arthropathy or other focal bone abnormality. Soft tissues are unremarkable. IMPRESSION: Negative. Electronically Signed   By: Deatra Robinson M.D.   On: 08/22/2017 00:38        Scheduled Meds: . colchicine  0.6 mg Oral BID  . guaiFENesin  1,200 mg Oral BID  . insulin aspart  0-9 Units Subcutaneous TID WC  . ipratropium-albuterol  3 mL Nebulization Q6H  . pantoprazole (PROTONIX) IV  40 mg Intravenous Q12H  . potassium chloride  40 mEq Oral Once  . sodium chloride flush  3 mL Intravenous Q12H   Continuous Infusions: . sodium chloride 75 mL/hr at 08/22/17 0027     LOS: 0 days    Time spent:    MEMON,JEHANZEB, MD [facility] Pager [phone]  If 7PM-7AM, please contact night-coverage [email or web address] Password [facility] 08/22/2017, 11:34 AM

## 2017-08-22 NOTE — Progress Notes (Signed)
*  PRELIMINARY RESULTS* Echocardiogram 2D Echocardiogram has been performed.  Jeryl Columbia 08/22/2017, 2:40 PM

## 2017-08-22 NOTE — Care Management Obs Status (Signed)
MEDICARE OBSERVATION STATUS NOTIFICATION   Patient Details  Name: Thomas Donovan MRN: [id number] Date of Birth: 1961-05-01   Medicare Observation Status Notification Given:  Yes    Malcolm Metro, RN 08/22/2017, 11:23 AM

## 2017-08-22 NOTE — Care Management Note (Signed)
Case Management Note  Patient Details  Name: Thomas Donovan MRN: [id number] Date of Birth: 03-Jan-1961  Subjective/Objective:                  Pt admitted with syncope. Pt from home, lives with spouse. ind with ADL's. No HH or DME needs pta. Pt has PCP, transportation to appointments and insurance with drug coverage. Pt communicates no needs/concerns about DC.   Action/Plan: DC home tomorrow, after syncope workup complete, with self care. No CM needs anticipated.   Expected Discharge Date:      08/23/2017            Expected Discharge Plan:  Home/Self Care  In-House Referral:  NA  Discharge planning Services  CM Consult  Post Acute Care Choice:  NA Choice offered to:  NA  Status of Service:  Completed, signed off  Malcolm Metro, RN 08/22/2017, 1:56 PM

## 2017-08-23 ENCOUNTER — Encounter (HOSPITAL_COMMUNITY): Payer: Self-pay

## 2017-08-23 ENCOUNTER — Observation Stay (HOSPITAL_BASED_OUTPATIENT_CLINIC_OR_DEPARTMENT_OTHER): Payer: Medicare Other

## 2017-08-23 DIAGNOSIS — R0789 Other chest pain: Secondary | ICD-10-CM | POA: Diagnosis not present

## 2017-08-23 DIAGNOSIS — E1169 Type 2 diabetes mellitus with other specified complication: Secondary | ICD-10-CM | POA: Diagnosis not present

## 2017-08-23 DIAGNOSIS — R0609 Other forms of dyspnea: Secondary | ICD-10-CM | POA: Diagnosis not present

## 2017-08-23 DIAGNOSIS — Z720 Tobacco use: Secondary | ICD-10-CM | POA: Diagnosis not present

## 2017-08-23 DIAGNOSIS — R55 Syncope and collapse: Secondary | ICD-10-CM

## 2017-08-23 DIAGNOSIS — E1165 Type 2 diabetes mellitus with hyperglycemia: Secondary | ICD-10-CM

## 2017-08-23 DIAGNOSIS — R079 Chest pain, unspecified: Secondary | ICD-10-CM | POA: Diagnosis not present

## 2017-08-23 LAB — GLUCOSE, CAPILLARY
GLUCOSE-CAPILLARY: 205 mg/dL — AB (ref 65–99)
GLUCOSE-CAPILLARY: 236 mg/dL — AB (ref 65–99)
Glucose-Capillary: 230 mg/dL — ABNORMAL HIGH (ref 65–99)
Glucose-Capillary: 215 mg/dL — ABNORMAL HIGH (ref 65–99)
Glucose-Capillary: 265 mg/dL — ABNORMAL HIGH (ref 65–99)

## 2017-08-23 LAB — NM MYOCAR MULTI W/SPECT W/WALL MOTION / EF
CHL CUP NUCLEAR SRS: 3
CHL CUP NUCLEAR SSS: 4
CHL CUP RESTING HR STRESS: 80 {beats}/min
CSEPPHR: 106 {beats}/min
LV dias vol: 131 mL (ref 62–150)
LV sys vol: 53 mL
NUC STRESS TID: 1.1
RATE: 0.36
SDS: 1

## 2017-08-23 LAB — BASIC METABOLIC PANEL
ANION GAP: 6 (ref 5–15)
BUN: 7 mg/dL (ref 6–20)
CHLORIDE: 101 mmol/L (ref 101–111)
CO2: 27 mmol/L (ref 22–32)
Calcium: 8.8 mg/dL — ABNORMAL LOW (ref 8.9–10.3)
Creatinine, Ser: 0.89 mg/dL (ref 0.61–1.24)
GFR calc Af Amer: >60 mL/min (ref >60)
GLUCOSE: 217 mg/dL — AB (ref 65–99)
POTASSIUM: 4.2 mmol/L (ref 3.5–5.1)
SODIUM: 134 mmol/L — AB (ref 135–145)

## 2017-08-23 LAB — HIV ANTIBODY (ROUTINE TESTING W REFLEX): HIV SCREEN 4TH GENERATION: NONREACTIVE

## 2017-08-23 MED ORDER — SODIUM CHLORIDE 0.9% FLUSH
INTRAVENOUS | Status: AC
  Administered 2017-08-23: 10 mL via INTRAVENOUS
  Filled 2017-08-23: qty 10

## 2017-08-23 MED ORDER — TECHNETIUM TC 99M TETROFOSMIN IV KIT
10.0000 | PACK | Freq: Once | INTRAVENOUS | Status: AC | PRN
  Administered 2017-08-23: 11 via INTRAVENOUS

## 2017-08-23 MED ORDER — METFORMIN HCL ER 500 MG PO TB24: 500.0000 mg | ORAL_TABLET | Freq: Two times a day (BID) | ORAL | 0 refills | Status: DC

## 2017-08-23 MED ORDER — ALBUTEROL SULFATE HFA 108 (90 BASE) MCG/ACT IN AERS: 1.0000 | INHALATION_SPRAY | Freq: Four times a day (QID) | RESPIRATORY_TRACT | 0 refills | Status: DC | PRN

## 2017-08-23 MED ORDER — COLCHICINE 0.6 MG PO TABS: 0.6000 mg | ORAL_TABLET | Freq: Two times a day (BID) | ORAL | 0 refills | Status: DC | PRN

## 2017-08-23 MED ORDER — LIVING WELL WITH DIABETES BOOK
Freq: Once | Status: AC
  Administered 2017-08-23: 12:00:00
  Filled 2017-08-23: qty 1

## 2017-08-23 MED ORDER — REGADENOSON 0.4 MG/5ML IV SOLN
INTRAVENOUS | Status: AC
  Administered 2017-08-23: 0.4 mg via INTRAVENOUS
  Filled 2017-08-23: qty 5

## 2017-08-23 MED ORDER — TECHNETIUM TC 99M TETROFOSMIN IV KIT
30.0000 | PACK | Freq: Once | INTRAVENOUS | Status: AC | PRN
  Administered 2017-08-23: 33 via INTRAVENOUS

## 2017-08-23 MED ORDER — INSULIN NPH (HUMAN) (ISOPHANE) 100 UNIT/ML ~~LOC~~ SUSP: 30.0000 [IU] | Freq: Three times a day (TID) | SUBCUTANEOUS | 0 refills | Status: DC

## 2017-08-23 NOTE — Plan of Care (Signed)
Problem: Safety: Goal: Ability to remain free from injury will improve Outcome: Progressing Bed in lowest position. Belongings and call bell within reach. RN discussed with patient the importance of calling for help when needing to get out of bed. Patient acknowledges information. patient has been using call light to get out of bed since discussion.

## 2017-08-23 NOTE — Progress Notes (Signed)
Progress Note  Patient Name: [name] Date of Encounter: 08/23/2017  Consulting Cardiologist: Dr. Dietrich Pates  Subjective   Reports intermittent dyspnea on exertion with lightheadedness. No chest pain or recurrent syncope under observation. He admits that he is under a tremendous amount of psychosocial stress at this time.  Inpatient Medications    Scheduled Meds: . colchicine  0.6 mg Oral BID  . gabapentin  600 mg Oral TID  . guaiFENesin  1,200 mg Oral BID  . insulin aspart  0-9 Units Subcutaneous TID WC  . pantoprazole (PROTONIX) IV  40 mg Intravenous Q12H  . sodium chloride flush  3 mL Intravenous Q12H    PRN Meds: gi cocktail, ipratropium-albuterol, morphine injection, ondansetron **OR** ondansetron (ZOFRAN) IV, oxyCODONE-acetaminophen, sodium chloride flush   Vital Signs    Vitals:   08/22/17 1432 08/22/17 2029 08/22/17 2032 08/23/17 0613  BP:  (!) 152/77  (!) 146/78  Pulse:  86  73  Resp:  18  20  Temp:  98.7 F (37.1 C)  97.6 F (36.4 C)  TempSrc:  Oral  Oral  SpO2: 95% 98% 94% 97%  Weight:      Height:        Intake/Output Summary (Last 24 hours) at 08/23/17 1536 Last data filed at 08/23/17 4287  Gross per 24 hour  Intake          1206.25 ml  Output             1150 ml  Net            56.25 ml   Filed Weights   08/21/17 1802 08/22/17 0010  Weight: 290 lb (131.5 kg) 290 lb (131.5 kg)    Telemetry    Sinus rhythm with rare PACs and PVCs, no sustained arrhythmias. Personally reviewed.  ECG    Tracing from 08/22/2017 showed sinus rhythm with PACs, one conducted and one nonconducted, nonspecific T-wave changes. Personally reviewed.  Physical Exam   GEN: Obese male. No acute distress.   Neck: No JVD. Cardiac: RRR, no murmur, rub, or gallop.  Respiratory: Nonlabored. Clear to auscultation bilaterally. GI: Soft, nontender, bowel sounds present. MS: No edema; No deformity.  Labs    Chemistry Recent Labs Lab 08/21/17 1832  08/22/17 0639 08/23/17 1212  NA 143 137 134*  K 3.2* 3.1* 4.2  CL 107 103 101  CO2 26 27 27   GLUCOSE 121* 85 217*  BUN 7 7 7   CREATININE 0.91 0.77 0.89  CALCIUM 9.0 8.1* 8.8*  PROT 6.4* 6.1*  --   ALBUMIN 3.8 3.5  --   AST 49* 35  --   ALT 67* 52  --   ALKPHOS 85 79  --   BILITOT 0.4 1.0  --   GFRNONAA >60 >60 >60  GFRAA >60 >60 >60  ANIONGAP 10 7 6      Hematology Recent Labs Lab 08/21/17 1832 08/22/17 0639  WBC 9.9 9.9  RBC 4.51 4.27  HGB 13.9 13.1  HCT 41.4 39.5  MCV 91.8 92.5  MCH 30.8 30.7  MCHC 33.6 33.2  RDW 14.1 14.1  PLT 254 219    Cardiac Enzymes Recent Labs Lab 08/21/17 1832 08/22/17 0021 08/22/17 0639 08/22/17 1231  TROPONINI <0.03 <0.03 <0.03 <0.03   No results for input(s): TROPIPOC in the last 168 hours.   BNP Recent Labs Lab 08/22/17 0639  BNP 35.0     Radiology    Chest CTA 08/21/2017: FINDINGS: Cardiovascular: Satisfactory opacification of the pulmonary  arteries to the segmental level. No evidence of pulmonary embolism. Normal heart size. No pericardial effusion. Non aneurysmal aorta. No dissection is seen  Mediastinum/Nodes: No enlarged mediastinal, hilar, or axillary lymph nodes. Thyroid gland, trachea, and esophagus demonstrate no significant findings.  Lungs/Pleura: Lungs are clear. No pleural effusion or pneumothorax.  Upper Abdomen: No acute abnormality.  Musculoskeletal: Spondylosis and endplate changes in the thoracic spine. No acute or suspicious bone lesion.  Review of the MIP images confirms the above findings.  IMPRESSION: 1. Negative for acute pulmonary embolus or aortic dissection 2. Clear lung fields  Cardiac Studies   Echocardiogram 08/22/2017: Study Conclusions  - Left ventricle: The cavity size was normal. Wall thickness was   normal. Systolic function was vigorous. The estimated ejection   fraction was in the range of 65% to 70%. Doppler parameters are   consistent with abnormal left  ventricular relaxation (grade 1   diastolic dysfunction). - Impressions: Poor acoustic windows limit study No gross wall   motion abnormalities  Impressions:  - Poor acoustic windows limit study No gross wall motion   abnormalities  Lexiscan Myoview 08/23/2017:  No diagnostic ST segment changes to indicate ischemia. No significant arrhythmias with rare PACs and PVCs.  Small, moderate intensity, apical to basal inferolateral defect that is fixed, in fact more prominent at rest than stress suggesting soft tissue attenuation rather than scar. No large ischemic zones are noted. TID ratio normal at 1.10.  This is a low risk study.  Nuclear stress EF: 60%.  Patient Profile     56 y.o. male with a history of hypertension, diabetes mellitus, obesity, chronic pain, and significant psychosocial stress now presenting with exertional shortness of breath, intermittent chest discomfort, also lightheadedness and reported orthostatic syncopal spells. He has ruled out for ACS by cardiac enzymes. ECG overall nonspecific. Telemetry shows rare PACs and PVCs, no sustained arrhythmias or pauses. Patient was seen in consultation by Dr. Tenny Craw on 8/28 with recommendation to consider left and right heart catheterization, however patient declines. He has undergone noninvasive cardiac evaluation which is been reassuring overall.  Assessment & Plan    1. Dyspnea on exertion and intermittent chest pain. No evidence of ACS by troponin I levels and ECG nonspecific. LVEF is normal range by echocardiogram and Lexiscan Myoview done earlier today was overall low risk showing no definitive ischemia. He had no evidence of aortic dissection or pulmonary embolus by chest CTA.  2. Reported episodes of syncope and intermittent lightheadedness, at this point etiology is uncertain. No arrhythmias or pauses have been uncovered by telemetry under observation. LVEF is normal range which would make the likelihood of a malignant  arrhythmia less probable.  3. Tobacco abuse. Smoking cessation recommended.  4. Type 2 diabetes mellitus, currently on insulin. Uncontrolled, hemoglobin A1c 9.2.  Discussed cardiac test results with patient and Dr. Irene Limbo. Less indication now to push him for further invasive cardiac assessment, and he has in fact declined thus far. Would consider checking ambulatory oxygen saturation to exclude hypoxia as a contributing factor. It may also be worth considering additional outpatient cardiac monitoring to exclude any sudden arrhythmias or pauses that have not been documented as an inpatient. At this point no further inpatient cardiac testing is planned. We will sign off. If further cardiac monitoring is requested, this can be arranged through our office with follow-up per Dr. Tenny Craw or APP.  Signed, Nona Dell, MD  08/23/2017, 3:36 PM
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
## 2017-08-23 NOTE — Discharge Summary (Addendum)
Physician Discharge Summary  Thomas Donovan ZOX:[id number] DOB: 10-Mar-1961 DOA: 08/21/2017  PCP: Louie Boston., MD  Admit date: 08/21/2017 Discharge date: 08/23/2017  Recommendations for Outpatient Follow-up:  1. Chest pain, syncope. If further systems, suggest outpatient cardiology evaluation. 2. Continue to encourage smoking cessation 3. Reported blood with bowel movements. Reported history of hemorrhoids. Could consider repeat CBC as outpatient.   Follow-up Information    Louie Boston., MD. Schedule an appointment as soon as possible for a visit in 1 week(s).   Specialty:  Family Medicine Contact information: [address] [place] [address] [phone]            Discharge Diagnoses:  1. Chest pain 2. Vasovagal syncope 3. Diabetes mellitus type 2 with peripheral neuropathy 4. Cigarette smoker  Discharge Condition: improved Disposition: home   Diet recommendation: heart healthy diabetic diet  Filed Weights   08/21/17 1802 08/22/17 0010  Weight: 131.5 kg (290 lb) 131.5 kg (290 lb)    History of present illness:  56 year old man PMH diabetes mellitus, presented with history of syncope, chest pain and reports of blood with bowel movements, vomiting blood. Admitted for chest pain evaluation.  Hospital Course:  Patient was observed, troponins were negative, ACS was ruled out. CT angiogram of the chest was negative for PE or dissection. He was seen by cardiology, recommendation was made for heart catheterization but patient declined this. Therefore nuclear stress testing was pursued. Stress test was noted to be low risk. Echocardiogram was reassuring. Cardiology recommended no further inpatient evaluation. Patient wondered spontaneously whether this might be related to significant stress he has currently experiencing. Other issues included reported syncopal event was likely vasovagal in nature. He reports only passing out once at home, other times he gets  dizzy and goes to his knees. No evidence of arrhythmias. Initial orthostatics are positive, repeat orthostatics were negative. He also reported retching and then coughing up what he describes as a small dark amount of material which he thought might be blood. This happened only one time prior to admission. He also reports that he has hemorrhoids and has chronic bleeding, apparently intermittently. No evidence of significant bleeding this hospitalization. Hemoglobin was within normal limits, no significant decline. No further inpatient evaluation recommended. Diabetes remains stable, hospitalization was uncomplicated.  Consultants:  Cardiology  Procedures:  Echo Study Conclusions  - Left ventricle: The cavity size was normal. Wall thickness was normal. Systolic function was vigorous. The estimated ejection fraction was in the range of 65% to 70%. Doppler parameters are consistent with abnormal left ventricular relaxation (grade 1 diastolic dysfunction). - Impressions: Poor acoustic windows limit study No gross wall motion abnormalities  Impressions:  - Poor acoustic windows limit study No gross wall motion abnormalities  Today's assessment: S: Feels okay today. Able to ambulate. He reports chronic pain. Seems to be breathing okay. Mild chest discomfort. O: Vitals: 98.3, 18, 85, 169/79, 98% on room air   Constitutional. Appears calm, comfortable.  Cardiovascular. Regular rate and rhythm. No murmur, rub or gallop.  Respiratory. Clear to auscultation bilaterally. No wheezes, rales or rhonchi. Normal respiratory effort.  Psychiatric. Grossly normal mood and affect. Speech fluent and appropriate.   I have personally reviewed the following:   Labs:  Recent metabolic panel unremarkable  Troponins negative, BNP unremarkable  LFTs unremarkable 8/28  Imaging studies:  Chest x-ray and apparently reviewed, no acute disease  CT angiogram of chest was negative  for PE or dissection  CT head negative  Nuclear stress tests low risk.  Discharge Instructions  Discharge Instructions    Diet - low sodium heart healthy    Complete by:  As directed    Diet Carb Modified    Complete by:  As directed    Discharge instructions    Complete by:  As directed    Call your physician or seek immediate medical attention for chest pain, shortness of breath, passing out, dizziness or worsening of condition.   Increase activity slowly    Complete by:  As directed      Allergies as of 08/23/2017      Reactions   Aspirin Other (See Comments)   Medication is contraindicated with pts gout medicine.     Ibuprofen Other (See Comments)   Reaction:  GI bleeding    Toradol [ketorolac Tromethamine] Other (See Comments)   Reaction:  Blisters between fingers    Tramadol Hives         Medication List    STOP taking these medications   oxyCODONE-acetaminophen 5-325 MG tablet Commonly known as:  PERCOCET   predniSONE 10 MG tablet Commonly known as:  DELTASONE     TAKE these medications   albuterol 108 (90 Base) MCG/ACT inhaler Commonly known as:  PROVENTIL HFA;VENTOLIN HFA Inhale 1-2 puffs into the lungs every 6 (six) hours as needed for wheezing or shortness of breath.   colchicine 0.6 MG tablet Take 1-2 tablets (0.6-1.2 mg total) by mouth 2 (two) times daily as needed (for gout flares).   insulin NPH Human 100 UNIT/ML injection Commonly known as:  HUMULIN N,NOVOLIN N Inject 0.3 mLs (30 Units total) into the skin 3 (three) times daily.   metFORMIN 500 MG 24 hr tablet Commonly known as:  GLUCOPHAGE-XR Take 1-2 tablets (500-1,000 mg total) by mouth 2 (two) times daily. Pt takes two tablets in the morning and one at bedtime.   omeprazole 20 MG capsule Commonly known as:  PRILOSEC Take 20 mg by mouth daily.            Discharge Care Instructions        Start     Ordered   08/23/17 0000  metFORMIN (GLUCOPHAGE-XR) 500 MG 24 hr tablet  2 times  daily     08/23/17 1551   08/23/17 0000  albuterol (PROVENTIL HFA;VENTOLIN HFA) 108 (90 Base) MCG/ACT inhaler  Every 6 hours PRN     08/23/17 1551   08/23/17 0000  insulin NPH Human (HUMULIN N,NOVOLIN N) 100 UNIT/ML injection  3 times daily     08/23/17 1551   08/23/17 0000  colchicine 0.6 MG tablet  2 times daily PRN     08/23/17 1551   08/23/17 0000  Increase activity slowly     08/23/17 1551   08/23/17 0000  Diet - low sodium heart healthy     08/23/17 1551   08/23/17 0000  Discharge instructions    Comments:  Call your physician or seek immediate medical attention for chest pain, shortness of breath, passing out, dizziness or worsening of condition.   08/23/17 1551   08/23/17 0000  Diet Carb Modified     08/23/17 1551     Allergies  Allergen Reactions  . Aspirin Other (See Comments)    Medication is contraindicated with pts gout medicine.    . Ibuprofen Other (See Comments)    Reaction:  GI bleeding   . Toradol [Ketorolac Tromethamine] Other (See Comments)    Reaction:  Blisters between fingers   .  Tramadol Hives         The results of significant diagnostics from this hospitalization (including imaging, microbiology, ancillary and laboratory) are listed below for reference.    Significant Diagnostic Studies: Dg Chest 2 View  Result Date: 08/21/2017 CLINICAL DATA:  Chest pain, syncopal episodes over the last few days, history hypertension, diabetes mellitus EXAM: CHEST  2 VIEW COMPARISON:  02/19/2016 FINDINGS: Normal heart size, mediastinal contours, and pulmonary vascularity. Lungs clear. No pleural effusion or pneumothorax. 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 IMPRESSION: No acute abnormalities. Electronically Signed   By: Ulyses Southward M.D.   On: 08/21/2017 19:35   Ct Head Wo Contrast  Result Date: 08/21/2017 CLINICAL DATA:  56 year old male with fall and loss of consciousness today. EXAM: CT HEAD WITHOUT CONTRAST TECHNIQUE: Contiguous axial  images were obtained from the base of the skull through the vertex without intravenous contrast. COMPARISON:  02/22/2016 01/06/2005 head CT FINDINGS: Brain: No evidence of acute infarction, hemorrhage, hydrocephalus, extra-axial collection or mass lesion/mass effect. Vascular: No hyperdense vessel or unexpected calcification. Skull: Normal. Negative for fracture or focal lesion. Sinuses/Orbits: No acute finding. Other: None. IMPRESSION: Unremarkable noncontrast head CT. Electronically Signed   By: Harmon Pier M.D.   On: 08/21/2017 22:16   Ct Angio Chest Pe W Or Wo Contrast  Result Date: 08/22/2017 CLINICAL DATA:  Syncopal episode some shortness of breath EXAM: CT ANGIOGRAPHY CHEST WITH CONTRAST TECHNIQUE: Multidetector CT imaging of the chest was performed using the standard protocol during bolus administration of intravenous contrast. Multiplanar CT image reconstructions and MIPs were obtained to evaluate the vascular anatomy. CONTRAST:  100 mL Isovue 370 intravenous COMPARISON:  Radiograph 08/21/2017, CT abdomen 03/16/2017 FINDINGS: Cardiovascular: Satisfactory opacification of the pulmonary arteries to the segmental level. No evidence of pulmonary embolism. Normal heart size. No pericardial effusion. Non aneurysmal aorta. No dissection is seen Mediastinum/Nodes: No enlarged mediastinal, hilar, or axillary lymph nodes. Thyroid gland, trachea, and esophagus demonstrate no significant findings. Lungs/Pleura: Lungs are clear. No pleural effusion or pneumothorax. Upper Abdomen: No acute abnormality. Musculoskeletal: Spondylosis and endplate changes in the thoracic spine. No acute or suspicious bone lesion. Review of the MIP images confirms the above findings. IMPRESSION: 1. Negative for acute pulmonary embolus or aortic dissection 2. Clear lung fields Electronically Signed   By: Jasmine Pang M.D.   On: 08/22/2017 00:28   Nm Myocar Multi W/spect W/wall Motion / Ef  Result Date: 08/23/2017  No diagnostic ST  segment changes to indicate ischemia. No significant arrhythmias with rare PACs and PVCs.  Small, moderate intensity, apical to basal inferolateral defect that is fixed, in fact more prominent at rest than stress suggesting soft tissue attenuation rather than scar. No large ischemic zones are noted. TID ratio normal at 1.10.  This is a low risk study.  Nuclear stress EF: 60%.    Dg Knee Complete 4 Views Left  Result Date: 08/22/2017 CLINICAL DATA:  Left knee pain EXAM: LEFT KNEE - COMPLETE 4+ VIEW COMPARISON:  None. FINDINGS: No evidence of fracture, dislocation, or joint effusion. No evidence of arthropathy or other focal bone abnormality. Soft tissues are unremarkable. IMPRESSION: Negative. Electronically Signed   By: Deatra Robinson M.D.   On: 08/22/2017 00:38    Labs: Basic Metabolic Panel:  Recent Labs Lab 08/21/17 1832 08/22/17 0639 08/23/17 1212  NA 143 137 134*  K 3.2* 3.1* 4.2  CL 107 103 101  CO2 26 27 27   GLUCOSE 121* 85 217*  BUN 7 7  7  CREATININE 0.91 0.77 0.89  CALCIUM 9.0 8.1* 8.8*   Liver Function Tests:  Recent Labs Lab 08/21/17 1832 08/22/17 0639  AST 49* 35  ALT 67* 52  ALKPHOS 85 79  BILITOT 0.4 1.0  PROT 6.4* 6.1*  ALBUMIN 3.8 3.5   CBC:  Recent Labs Lab 08/21/17 1832 08/22/17 0639  WBC 9.9 9.9  HGB 13.9 13.1  HCT 41.4 39.5  MCV 91.8 92.5  PLT 254 219   Cardiac Enzymes:  Recent Labs Lab 08/21/17 1832 08/22/17 0021 08/22/17 0639 08/22/17 1231  TROPONINI <0.03 <0.03 <0.03 <0.03     Recent Labs  08/22/17 0639  BNP 35.0    CBG:  Recent Labs Lab 08/23/17 0107 08/23/17 0612 08/23/17 0808 08/23/17 1134 08/23/17 1650  GLUCAP 205* 230* 215* 236* 265*    Active Problems:   Syncope   Chest pain   Dyspnea   Hematochezia   Tobacco use   Hypokalemia   GERD (gastroesophageal reflux disease)   Type 2 diabetes mellitus with other specified complication (HCC)   Time coordinating discharge: 35 minutes  Signed:  Brendia Sacksaniel  Goodrich, MD [facility] 08/23/2017, 5:55 PM

## 2017-08-23 NOTE — Progress Notes (Signed)
Thomas Mccoyonald R Koch discharged Home per MD order.  Discharge instructions reviewed and discussed with the patient, all questions and concerns answered. Copy of instructions and scripts given to patient.  Allergies as of 08/23/2017      Reactions   Aspirin Other (See Comments)   Medication is contraindicated with pts gout medicine.     Ibuprofen Other (See Comments)   Reaction:  GI bleeding    Toradol [ketorolac Tromethamine] Other (See Comments)   Reaction:  Blisters between fingers    Tramadol Hives         Medication List    STOP taking these medications   oxyCODONE-acetaminophen 5-325 MG tablet Commonly known as:  PERCOCET   predniSONE 10 MG tablet Commonly known as:  DELTASONE     TAKE these medications   albuterol 108 (90 Base) MCG/ACT inhaler Commonly known as:  PROVENTIL HFA;VENTOLIN HFA Inhale 1-2 puffs into the lungs every 6 (six) hours as needed for wheezing or shortness of breath.   colchicine 0.6 MG tablet Take 1-2 tablets (0.6-1.2 mg total) by mouth 2 (two) times daily as needed (for gout flares).   insulin NPH Human 100 UNIT/ML injection Commonly known as:  HUMULIN N,NOVOLIN N Inject 0.3 mLs (30 Units total) into the skin 3 (three) times daily.   metFORMIN 500 MG 24 hr tablet Commonly known as:  GLUCOPHAGE-XR Take 1-2 tablets (500-1,000 mg total) by mouth 2 (two) times daily. Pt takes two tablets in the morning and one at bedtime.   omeprazole 20 MG capsule Commonly known as:  PRILOSEC Take 20 mg by mouth daily.            Discharge Care Instructions        Start     Ordered   08/23/17 0000  metFORMIN (GLUCOPHAGE-XR) 500 MG 24 hr tablet  2 times daily     08/23/17 1551   08/23/17 0000  albuterol (PROVENTIL HFA;VENTOLIN HFA) 108 (90 Base) MCG/ACT inhaler  Every 6 hours PRN     08/23/17 1551   08/23/17 0000  insulin NPH Human (HUMULIN N,NOVOLIN N) 100 UNIT/ML injection  3 times daily     08/23/17 1551   08/23/17 0000  colchicine 0.6 MG tablet  2  times daily PRN     08/23/17 1551   08/23/17 0000  Increase activity slowly     08/23/17 1551   08/23/17 0000  Diet - low sodium heart healthy     08/23/17 1551   08/23/17 0000  Discharge instructions    Comments:  Call your physician or seek immediate medical attention for chest pain, shortness of breath, passing out, dizziness or worsening of condition.   08/23/17 1551   08/23/17 0000  Diet Carb Modified     08/23/17 1551      Patients skin is clean, dry and intact, no evidence of skin break down. IV site discontinued and catheter remains intact. Site without signs and symptoms of complications. Dressing and pressure applied.  Patient ambulated to room 338 (who is a family member) to wait for his ride,  no distress noted upon discharge.  Rica KoyanagiBonnie M Lanitra Battaglini 08/23/2017 5:20 PM

## 2017-08-24 NOTE — ED Provider Notes (Signed)
[facility]-EMERGENCY DEPT Provider Note   CSN: [id number] Arrival date & time: 08/21/17  1757     History   Chief Complaint Chief Complaint  Patient presents with  . Chest Pain  . Shortness of Breath    HPI Thomas Donovan is a [age] y.o. male.  Pt states he has 2 episodes of passing out today.  No dizzyness   The history is provided by the patient.  Loss of Consciousness   This is a new problem. The current episode started 1 to 2 hours ago. The problem occurs rarely. The problem has been resolved. He lost consciousness for a period of 1 to 5 minutes. The problem is associated with normal activity. Associated symptoms include chest pain. Pertinent negatives include abdominal pain, back pain, congestion, headaches, seizures and visual change.    Past Medical History:  Diagnosis Date  . Chronic back pain   . Cold    recent rx  . Degeneration of lumbar intervertebral disc   . Diabetes mellitus   . GERD (gastroesophageal reflux disease)   . Gout   . History of kidney stones   . Hypertension   . Pneumonia     Patient Active Problem List   Diagnosis Date Noted  . Tobacco use 08/22/2017  . Hypokalemia 08/22/2017  . GERD (gastroesophageal reflux disease) 08/22/2017  . Type 2 diabetes mellitus with other specified complication (HCC) 08/22/2017  . Syncope 08/21/2017  . Chest pain 08/21/2017  . Dyspnea 08/21/2017  . Hematochezia 08/21/2017  . 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 BACK SURGERY    . MAXIMUM ACCESS (MAS)POSTERIOR LUMBAR INTERBODY FUSION (PLIF) 1 LEVEL  11/20/2013   Procedure: FOR MAXIMUM ACCESS (MAS) POSTERIOR LUMBAR INTERBODY FUSION LUMBAR FIVE-SACRAL ONE;  Surgeon: Tia Alertavid S Jones, MD;  Location: [facility];  Service: Neurosurgery;;  FOR MAXIMUM ACCESS (MAS) POSTERIOR LUMBAR INTERBODY FUSION LUMBAR FIVE-SACRAL ONE  . TONSILLECTOMY         Home Medications    Prior to Admission medications     Medication Sig Start Date End Date Taking? Authorizing Provider  omeprazole (PRILOSEC) 20 MG capsule Take 20 mg by mouth daily.    Yes [provider]  albuterol (PROVENTIL HFA;VENTOLIN HFA) 108 (90 Base) MCG/ACT inhaler Inhale 1-2 puffs into the lungs every 6 (six) hours as needed for wheezing or shortness of breath. 08/23/17   Standley BrookingGoodrich, Daniel P, MD  colchicine 0.6 MG tablet Take 1-2 tablets (0.6-1.2 mg total) by mouth 2 (two) times daily as needed (for gout flares). 08/23/17   Standley BrookingGoodrich, Daniel P, MD  insulin NPH Human (HUMULIN N,NOVOLIN N) 100 UNIT/ML injection Inject 0.3 mLs (30 Units total) into the skin 3 (three) times daily. 08/23/17   Standley BrookingGoodrich, Daniel P, MD  metFORMIN (GLUCOPHAGE-XR) 500 MG 24 hr tablet Take 1-2 tablets (500-1,000 mg total) by mouth 2 (two) times daily. Pt takes two tablets in the morning and one at bedtime. 08/23/17   Standley BrookingGoodrich, Daniel P, MD    Family History History reviewed. No pertinent family history.  Social History Social History  Substance Use Topics  . Smoking status: Current Every Day Smoker    Packs/day: 0.50    Years: 10.00    Types: Cigarettes  . Smokeless tobacco: Never Used  . Alcohol use 7.2 oz/week    12 Cans of beer per week     Comment: every 2 or 3 days     Allergies   Aspirin;  Ibuprofen; Toradol [ketorolac tromethamine]; and Tramadol   Review of Systems Review of Systems  Constitutional: Negative for appetite change and fatigue.  HENT: Negative for congestion, ear discharge and sinus pressure.   Eyes: Negative for discharge.  Respiratory: Negative for cough.   Cardiovascular: Positive for chest pain and syncope.  Gastrointestinal: Negative for abdominal pain and diarrhea.  Genitourinary: Negative for frequency and hematuria.  Musculoskeletal: Negative for back pain.  Skin: Negative for rash.  Neurological: Positive for syncope. Negative for seizures and headaches.  Psychiatric/Behavioral: Negative for hallucinations.      Physical Exam Updated Vital Signs BP (!) 169/79 (BP Location: Left Arm)   Pulse 85   Temp 98.3 F (36.8 C) (Oral)   Resp 18   Ht 6' (1.829 m)   Wt 131.5 kg (290 lb)   SpO2 98%   BMI 39.33 kg/m   Physical Exam  Constitutional: He is oriented to person, place, and time. He appears well-developed.  HENT:  Head: Normocephalic.  Eyes: Conjunctivae and EOM are normal. No scleral icterus.  Neck: Neck supple. No thyromegaly present.  Cardiovascular: Normal rate and regular rhythm.  Exam reveals no gallop and no friction rub.   No murmur heard. Pulmonary/Chest: No stridor. He has no wheezes. He has no rales. He exhibits no tenderness.  Abdominal: He exhibits no distension. There is no tenderness. There is no rebound.  Musculoskeletal: Normal range of motion. He exhibits no edema.  Lymphadenopathy:    He has no cervical adenopathy.  Neurological: He is oriented to person, place, and time. He exhibits normal muscle tone. Coordination normal.  Skin: No rash noted. No erythema.  Psychiatric: He has a normal mood and affect. His behavior is normal.     ED Treatments / Results  Labs (all labs ordered are listed, but only abnormal results are displayed) Labs Reviewed  BASIC METABOLIC PANEL - Abnormal; Notable for the following:       Result Value   Potassium 3.2 (*)    Glucose, Bld 121 (*)    All other components within normal limits  HEPATIC FUNCTION PANEL - Abnormal; Notable for the following:    Total Protein 6.4 (*)    AST 49 (*)    ALT 67 (*)    All other components within normal limits  HEMOGLOBIN A1C - Abnormal; Notable for the following:    Hgb A1c MFr Bld 9.2 (*)    All other components within normal limits  COMPREHENSIVE METABOLIC PANEL - Abnormal; Notable for the following:    Potassium 3.1 (*)    Calcium 8.1 (*)    Total Protein 6.1 (*)    All other components within normal limits  GLUCOSE, CAPILLARY - Abnormal; Notable for the following:     Glucose-Capillary 215 (*)    All other components within normal limits  GLUCOSE, CAPILLARY - Abnormal; Notable for the following:    Glucose-Capillary 228 (*)    All other components within normal limits  GLUCOSE, CAPILLARY - Abnormal; Notable for the following:    Glucose-Capillary 205 (*)    All other components within normal limits  GLUCOSE, CAPILLARY - Abnormal; Notable for the following:    Glucose-Capillary 230 (*)    All other components within normal limits  GLUCOSE, CAPILLARY - Abnormal; Notable for the following:    Glucose-Capillary 215 (*)    All other components within normal limits  BASIC METABOLIC PANEL - Abnormal; Notable for the following:    Sodium 134 (*)    Glucose,  Bld 217 (*)    Calcium 8.8 (*)    All other components within normal limits  GLUCOSE, CAPILLARY - Abnormal; Notable for the following:    Glucose-Capillary 236 (*)    All other components within normal limits  GLUCOSE, CAPILLARY - Abnormal; Notable for the following:    Glucose-Capillary 265 (*)    All other components within normal limits  CBC  TROPONIN I  HIV ANTIBODY (ROUTINE TESTING)  TSH  TROPONIN I  TROPONIN I  TROPONIN I  CBC  GLUCOSE, CAPILLARY  GLUCOSE, CAPILLARY  GLUCOSE, CAPILLARY  BRAIN NATRIURETIC PEPTIDE    EKG  EKG Interpretation  Date/Time:  Monday August 21 2017 18:03:04 EDT Ventricular Rate:  93 PR Interval:    QRS Duration: 88 QT Interval:  352 QTC Calculation: 438 R Axis:   6 Text Interpretation:  Sinus rhythm Atrial premature complex Sinus pause Abnormal R-wave progression, early transition Borderline T wave abnormalities Confirmed by Jacalyn Lefevre [phone]) on 08/22/2017 11:47:37 AM       Radiology Nm Myocar Multi W/spect W/wall Motion / Ef  Result Date: 08/23/2017  No diagnostic ST segment changes to indicate ischemia. No significant arrhythmias with rare PACs and PVCs.  Small, moderate intensity, apical to basal inferolateral defect that is fixed, in  fact more prominent at rest than stress suggesting soft tissue attenuation rather than scar. No large ischemic zones are noted. TID ratio normal at 1.10.  This is a low risk study.  Nuclear stress EF: 60%.     Procedures Procedures (including critical care time)  Medications Ordered in ED Medications  perflutren lipid microspheres (DEFINITY) IV suspension (2 mLs Intravenous Given 08/22/17 1425)  morphine 4 MG/ML injection 6 mg (6 mg Intravenous Given 08/21/17 1837)  ondansetron (ZOFRAN) injection 4 mg (4 mg Intravenous Given 08/21/17 1837)  pantoprazole (PROTONIX) injection 40 mg (40 mg Intravenous Given 08/21/17 1837)  sodium chloride 0.9 % bolus 1,000 mL (0 mLs Intravenous Stopped 08/21/17 2310)  oxyCODONE-acetaminophen (PERCOCET/ROXICET) 5-325 MG per tablet 1 tablet (1 tablet Oral Given 08/21/17 2139)  potassium chloride 10 mEq in 100 mL IVPB (0 mEq Intravenous Stopped 08/22/17 0422)  iopamidol (ISOVUE-370) 76 % injection 100 mL (100 mLs Intravenous Contrast Given 08/21/17 2320)  potassium chloride SA (K-DUR,KLOR-CON) CR tablet 40 mEq (40 mEq Oral Given 08/22/17 1141)  living well with diabetes book MISC ( Does not apply Given 08/23/17 1144)  technetium tetrofosmin (TC-MYOVIEW) injection 30 millicurie (33 millicuries Intravenous Contrast Given 08/23/17 1015)  technetium tetrofosmin (TC-MYOVIEW) injection 10 millicurie (11 millicuries Intravenous Contrast Given 08/23/17 0850)  regadenoson (LEXISCAN) 0.4 MG/5ML injection SOLN (0.4 mg Intravenous Given 08/23/17 1019)  sodium chloride flush (NS) 0.9 % injection (10 mLs Intravenous Given 08/23/17 1019)     Initial Impression / Assessment and Plan / ED Course  I have reviewed the triage vital signs and the nursing notes.  Pertinent labs & imaging results that were available during my care of the patient were reviewed by me and considered in my medical decision making (see chart for details).     Pt admitted for syncope and chest pain  Final  Clinical Impressions(s) / ED Diagnoses   Final diagnoses:  Syncope and collapse    New Prescriptions Discharge Medication List as of 08/23/2017  4:53 PM       Bethann Berkshire, MD 08/24/17 1302

## 2017-12-01 ENCOUNTER — Emergency Department (HOSPITAL_COMMUNITY): Payer: Medicare Other

## 2017-12-01 ENCOUNTER — Encounter (HOSPITAL_COMMUNITY): Payer: Self-pay | Admitting: Emergency Medicine

## 2017-12-01 ENCOUNTER — Emergency Department (HOSPITAL_COMMUNITY)
Admission: EM | Admit: 2017-12-01 | Discharge: 2017-12-01 | Disposition: A | Discharge status: Home / Self Care | Payer: Medicare Other | Attending: Emergency Medicine | Admitting: Emergency Medicine

## 2017-12-01 DIAGNOSIS — F17210 Nicotine dependence, cigarettes, uncomplicated: Secondary | ICD-10-CM | POA: Diagnosis not present

## 2017-12-01 DIAGNOSIS — R319 Hematuria, unspecified: Secondary | ICD-10-CM | POA: Diagnosis present

## 2017-12-01 DIAGNOSIS — Z794 Long term (current) use of insulin: Secondary | ICD-10-CM | POA: Insufficient documentation

## 2017-12-01 DIAGNOSIS — N200 Calculus of kidney: Secondary | ICD-10-CM

## 2017-12-01 DIAGNOSIS — I10 Essential (primary) hypertension: Secondary | ICD-10-CM | POA: Diagnosis not present

## 2017-12-01 DIAGNOSIS — Z79899 Other long term (current) drug therapy: Secondary | ICD-10-CM | POA: Diagnosis not present

## 2017-12-01 DIAGNOSIS — E119 Type 2 diabetes mellitus without complications: Secondary | ICD-10-CM | POA: Insufficient documentation

## 2017-12-01 DIAGNOSIS — R109 Unspecified abdominal pain: Secondary | ICD-10-CM | POA: Insufficient documentation

## 2017-12-01 LAB — BASIC METABOLIC PANEL
ANION GAP: 13 (ref 5–15)
BUN: 15 mg/dL (ref 6–20)
CO2: 26 mmol/L (ref 22–32)
Calcium: 9.2 mg/dL (ref 8.9–10.3)
Chloride: 99 mmol/L — ABNORMAL LOW (ref 101–111)
Creatinine, Ser: 0.9 mg/dL (ref 0.61–1.24)
Glucose, Bld: 187 mg/dL — ABNORMAL HIGH (ref 65–99)
POTASSIUM: 3.2 mmol/L — AB (ref 3.5–5.1)
SODIUM: 138 mmol/L (ref 135–145)

## 2017-12-01 LAB — URINALYSIS, ROUTINE W REFLEX MICROSCOPIC
BILIRUBIN URINE: NEGATIVE
Bacteria, UA: NONE SEEN
Glucose, UA: >=500 mg/dL — AB
Ketones, ur: NEGATIVE mg/dL
Leukocytes, UA: NEGATIVE
Nitrite: NEGATIVE
PROTEIN: NEGATIVE mg/dL
Specific Gravity, Urine: 1.002 — ABNORMAL LOW (ref 1.005–1.030)
Squamous Epithelial / LPF: NONE SEEN
pH: 6 (ref 5.0–8.0)

## 2017-12-01 LAB — CBC WITH DIFFERENTIAL/PLATELET
BASOS ABS: 0 10*3/uL (ref 0.0–0.1)
Basophils Relative: 0 %
EOS ABS: 0.3 10*3/uL (ref 0.0–0.7)
EOS PCT: 4 %
HCT: 41.4 % (ref 39.0–52.0)
Hemoglobin: 13.6 g/dL (ref 13.0–17.0)
LYMPHS PCT: 36 %
Lymphs Abs: 2.9 10*3/uL (ref 0.7–4.0)
MCH: 30.3 pg (ref 26.0–34.0)
MCHC: 32.9 g/dL (ref 30.0–36.0)
MCV: 92.2 fL (ref 78.0–100.0)
Monocytes Absolute: 0.5 10*3/uL (ref 0.1–1.0)
Monocytes Relative: 7 %
Neutro Abs: 4.2 10*3/uL (ref 1.7–7.7)
Neutrophils Relative %: 53 %
Platelets: 186 10*3/uL (ref 150–400)
RBC: 4.49 MIL/uL (ref 4.22–5.81)
RDW: 13.8 % (ref 11.5–15.5)
WBC: 7.9 10*3/uL (ref 4.0–10.5)

## 2017-12-01 MED ORDER — MORPHINE SULFATE (PF) 4 MG/ML IV SOLN
4.0000 mg | Freq: Once | INTRAVENOUS | Status: AC
  Administered 2017-12-01: 4 mg via INTRAVENOUS
  Filled 2017-12-01: qty 1

## 2017-12-01 MED ORDER — HYDROMORPHONE HCL 1 MG/ML IJ SOLN
1.0000 mg | Freq: Once | INTRAMUSCULAR | Status: AC
  Administered 2017-12-01: 1 mg via INTRAVENOUS
  Filled 2017-12-01: qty 1

## 2017-12-01 MED ORDER — ONDANSETRON HCL 4 MG/2ML IJ SOLN
4.0000 mg | Freq: Once | INTRAMUSCULAR | Status: AC
  Administered 2017-12-01: 4 mg via INTRAVENOUS
  Filled 2017-12-01: qty 2

## 2017-12-01 MED ORDER — SODIUM CHLORIDE 0.9 % IV BOLUS (SEPSIS)
1000.0000 mL | Freq: Once | INTRAVENOUS | Status: AC
  Administered 2017-12-01: 1000 mL via INTRAVENOUS

## 2017-12-01 NOTE — Discharge Instructions (Signed)
Please follow-up with the urologist for work-up of your prostate. The blood in your urine is related to your kidney stones but your urologist can also follow-up with this.  Please call your primary care doctor for close follow-up and pain control  Return for worsening symptoms, including inability to urinate, fever, vomiting or any other symptoms concerning to you.

## 2017-12-01 NOTE — ED Triage Notes (Signed)
Pt reports left flank pain with hematuria x 3 days.

## 2017-12-01 NOTE — ED Provider Notes (Signed)
[facility] EMERGENCY DEPARTMENT Provider Note   CSN: [id number] Arrival date & time: 12/01/17  1820     History   Chief Complaint Chief Complaint  Patient presents with  . Hematuria    HPI Thomas Donovan is a 56 y.o. male.  HPI 56 year old male who presents with hematuria and left flank pain.  He has had intermittent left flank pain over the past 2-3 weeks, although more constant over the past few days.  Feels very different than when he has had kidney stones in the past.  He has no prior abdominal surgeries and also has a history of diabetes hypertension.  Over the course of the past 3 days, he has noticed hematuria, passing frank blood.  He has not had urinary retention, dysuria but does feel urinary frequency.  Does state recently that he has had blood work performed through the PCPs office and is told that he may have prostate issues as well as kidney issues.  Denies any fevers, chills, nausea or vomiting, testicular swelling or pain, diarrhea. Past Medical History:  Diagnosis Date  . Chronic back pain   . Cold    recent rx  . Degeneration of lumbar intervertebral disc   . Diabetes mellitus   . GERD (gastroesophageal reflux disease)   . Gout   . History of kidney stones   . Hypertension   . Pneumonia     Patient Active Problem List   Diagnosis Date Noted  . Tobacco use 08/22/2017  . Hypokalemia 08/22/2017  . GERD (gastroesophageal reflux disease) 08/22/2017  . Type 2 diabetes mellitus with other specified complication (HCC) 08/22/2017  . Syncope 08/21/2017  . Chest pain 08/21/2017  . Dyspnea 08/21/2017  . Hematochezia 08/21/2017  . 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 BACK SURGERY    . MAXIMUM ACCESS (MAS)POSTERIOR LUMBAR INTERBODY FUSION (PLIF) 1 LEVEL  11/20/2013   Procedure: FOR MAXIMUM ACCESS (MAS) POSTERIOR LUMBAR INTERBODY FUSION LUMBAR FIVE-SACRAL ONE;  Surgeon: Tia Alert, MD;  Location: [facility];  Service: Neurosurgery;;  FOR MAXIMUM ACCESS (MAS) POSTERIOR LUMBAR INTERBODY FUSION LUMBAR FIVE-SACRAL ONE  . TONSILLECTOMY         Home Medications    Prior to Admission medications   Medication Sig Start Date End Date Taking? Authorizing Provider  albuterol (PROVENTIL HFA;VENTOLIN HFA) 108 (90 Base) MCG/ACT inhaler Inhale 1-2 puffs into the lungs every 6 (six) hours as needed for wheezing or shortness of breath. 08/23/17   Standley Brooking, MD  colchicine 0.6 MG tablet Take 1-2 tablets (0.6-1.2 mg total) by mouth 2 (two) times daily as needed (for gout flares). 08/23/17   Standley Brooking, MD  insulin NPH Human (HUMULIN N,NOVOLIN N) 100 UNIT/ML injection Inject 0.3 mLs (30 Units total) into the skin 3 (three) times daily. 08/23/17   Standley Brooking, MD  metFORMIN (GLUCOPHAGE-XR) 500 MG 24 hr tablet Take 1-2 tablets (500-1,000 mg total) by mouth 2 (two) times daily. Pt takes two tablets in the morning and one at bedtime. 08/23/17   Standley Brooking, MD  omeprazole (PRILOSEC) 20 MG capsule Take 20 mg by mouth daily.     [provider]    Family History History reviewed. No pertinent family history.  Social History Social History   Tobacco Use  . Smoking status: Current Every Day Smoker    Packs/day: 0.50    Years: 10.00    Pack years: 5.00  Types: Cigarettes  . Smokeless tobacco: Never Used  Substance Use Topics  . Alcohol use: Yes    Alcohol/week: 7.2 oz    Types: 12 Cans of beer per week    Comment: every 2 or 3 days  . Drug use: No     Allergies   Aspirin; Ibuprofen; Toradol [ketorolac tromethamine]; and Tramadol   Review of Systems Review of Systems  Constitutional: Negative for fever.  Respiratory: Negative for shortness of breath.   Genitourinary: Positive for flank pain and hematuria. Negative for dysuria.  All other systems reviewed and are negative.    Physical Exam Updated Vital Signs BP 130/65 (BP Location: Left Arm)    Pulse 88   Temp 98.2 F (36.8 C) (Oral)   Resp 18   Ht 6' (1.829 m)   Wt 131.5 kg (290 lb)   SpO2 95%   BMI 39.33 kg/m   Physical Exam Physical Exam  Nursing note and vitals reviewed. Constitutional: Well developed, well nourished, non-toxic, and in no acute distress Head: Normocephalic and atraumatic.  Mouth/Throat: Oropharynx is clear and moist.  Neck: Normal range of motion. Neck supple.  Cardiovascular: Normal rate and regular rhythm.   Pulmonary/Chest: Effort normal and breath sounds normal.  Abdominal: Soft. There is no tenderness. There is no rebound and no guarding. left CVA tenderness Musculoskeletal: Normal range of motion.  Neurological: Alert, no facial droop, fluent speech, moves all extremities symmetrically Skin: Skin is warm and dry.  Psychiatric: Cooperative   ED Treatments / Results  Labs (all labs ordered are listed, but only abnormal results are displayed) Labs Reviewed  URINALYSIS, ROUTINE W REFLEX MICROSCOPIC - Abnormal; Notable for the following components:      Result Value   Color, Urine STRAW (*)    Specific Gravity, Urine 1.002 (*)    Glucose, UA >=500 (*)    Hgb urine dipstick LARGE (*)    All other components within normal limits  BASIC METABOLIC PANEL - Abnormal; Notable for the following components:   Potassium 3.2 (*)    Chloride 99 (*)    Glucose, Bld 187 (*)    All other components within normal limits  CBC WITH DIFFERENTIAL/PLATELET    EKG  EKG Interpretation None       Radiology Ct Renal Stone Study  Result Date: 12/01/2017 CLINICAL DATA:  Left-sided flank pain and hematuria for 2 weeks. EXAM: CT ABDOMEN AND PELVIS WITHOUT CONTRAST TECHNIQUE: Multidetector CT imaging of the abdomen and pelvis was performed following the standard protocol without IV contrast. COMPARISON:  CT abdomen and pelvis without contrast 03/16/2017. FINDINGS: Lower chest: Pleural scarring is present at the left base. The lungs are otherwise clear. The  heart size is normal. No significant pleural or pericardial effusion is present. Hepatobiliary: No focal liver abnormality is seen. No gallstones, gallbladder wall thickening, or biliary dilatation. Pancreas: Unremarkable. No pancreatic ductal dilatation or surrounding inflammatory changes. Spleen: Normal in size without focal abnormality. Adrenals/Urinary Tract: The adrenal glands are normal bilaterally. Bilateral nonobstructing lower pole renal calculi are again seen. There is no significant interval change. The right lower lobe stone scratched at the right lower pole stone measures 8 mm. Left-sided calcification is within or adjacent to renal cyst. There is no hydronephrosis. Ureters are within normal limits bilaterally. The urinary bladder is within normal limits. Stomach/Bowel: The stomach and duodenum are within normal limits. Small bowel is unremarkable. Terminal ileum is within normal limits. The appendix is visualized and normal. The ascending and transverse colon  are within normal limits. The the descending colon is normal. Diverticular changes are present throughout the sigmoid colon. There is no focal inflammation to suggest diverticulitis. Vascular/Lymphatic: No significant vascular findings are present. No enlarged abdominal or pelvic lymph nodes. Reproductive: Prostate is unremarkable. Other: At herniates into the inguinal canals bilaterally without associated bowel. No other ventral hernia is present. There is no significant free fluid. Musculoskeletal: L5-S1 fusion is stable. A vacuum disc and adjacent level disease is again noted at L4-5. Vertebral body heights maintained. No focal lytic or blastic lesions are present. The bony pelvis is intact. The hips are located and within normal limits bilaterally. IMPRESSION: 1. Stable appearance of bilateral nonobstructive lower lower pole nephrolithiasis. 2. No hydronephrosis or inflammatory change to suggest recent passage of stone. 3. No acute or focal  abnormality to explain left-sided flank pain or hematuria. 4. Sigmoid diverticulosis without diverticulitis. Electronically Signed   By: Marin Robertshristopher  Mattern M.D.   On: 12/01/2017 21:16    Procedures Procedures (including critical care time)  Medications Ordered in ED Medications  morphine 4 MG/ML injection 4 mg (4 mg Intravenous Given 12/01/17 2024)  ondansetron (ZOFRAN) injection 4 mg (4 mg Intravenous Given 12/01/17 2024)  sodium chloride 0.9 % bolus 1,000 mL (0 mLs Intravenous Stopped 12/01/17 2149)  HYDROmorphone (DILAUDID) injection 1 mg (1 mg Intravenous Given 12/01/17 2125)     Initial Impression / Assessment and Plan / ED Course  I have reviewed the triage vital signs and the nursing notes.  Pertinent labs & imaging results that were available during my care of the patient were reviewed by me and considered in my medical decision making (see chart for details).     Presenting with intermittent left flank pain for 2-3 weeks with 3 days of hematuria.  Vital signs are normal.  He is nontoxic in no acute distress.  Soft and nonsurgical abdomen, but with left flank tenderness to palpation percussion.  UA does show hematuria but without evidence of infection.  He has normal renal function.  No leukocytosis.  CT renal study shows nonobstructing kidney stones but no other acute retroperitoneal or intra-abdominal processes.  Hematuria related to nephrolithiasis.  He also was told by his PCP that he has prostate issues.  We will give him urology follow-up. Pain may be MSK back pain. no midline tenderness or neuro complaints. Supportive care reviewed.  Strict return and follow-up instructions reviewed. He expressed understanding of all discharge instructions and felt comfortable with the plan of care.   Final Clinical Impressions(s) / ED Diagnoses   Final diagnoses:  Left flank pain  Nephrolithiasis    ED Discharge Orders    None       Lavera GuiseLiu, Monay Houlton Duo, MD 12/01/17 2238

## 2017-12-26 DIAGNOSIS — I719 Aortic aneurysm of unspecified site, without rupture: Secondary | ICD-10-CM

## 2017-12-26 HISTORY — DX: Aortic aneurysm of unspecified site, without rupture: I71.9

## 2018-01-08 ENCOUNTER — Ambulatory Visit: Payer: Medicare Other | Admitting: Physical Therapy

## 2018-03-01 ENCOUNTER — Encounter (HOSPITAL_COMMUNITY): Payer: Self-pay | Admitting: Emergency Medicine

## 2018-03-01 ENCOUNTER — Emergency Department (HOSPITAL_COMMUNITY)
Admission: EM | Admit: 2018-03-01 | Discharge: 2018-03-02 | Disposition: A | Discharge status: Home / Self Care | Payer: Medicare Other | Source: Home / Self Care | Attending: Emergency Medicine | Admitting: Emergency Medicine

## 2018-03-01 DIAGNOSIS — M25562 Pain in left knee: Secondary | ICD-10-CM | POA: Insufficient documentation

## 2018-03-01 DIAGNOSIS — F17210 Nicotine dependence, cigarettes, uncomplicated: Secondary | ICD-10-CM

## 2018-03-01 DIAGNOSIS — I10 Essential (primary) hypertension: Secondary | ICD-10-CM

## 2018-03-01 DIAGNOSIS — I2510 Atherosclerotic heart disease of native coronary artery without angina pectoris: Secondary | ICD-10-CM | POA: Diagnosis not present

## 2018-03-01 DIAGNOSIS — I259 Chronic ischemic heart disease, unspecified: Secondary | ICD-10-CM

## 2018-03-01 DIAGNOSIS — E119 Type 2 diabetes mellitus without complications: Secondary | ICD-10-CM

## 2018-03-01 DIAGNOSIS — I119 Hypertensive heart disease without heart failure: Secondary | ICD-10-CM | POA: Diagnosis not present

## 2018-03-01 DIAGNOSIS — Z794 Long term (current) use of insulin: Secondary | ICD-10-CM

## 2018-03-01 DIAGNOSIS — M10062 Idiopathic gout, left knee: Secondary | ICD-10-CM | POA: Insufficient documentation

## 2018-03-01 DIAGNOSIS — M109 Gout, unspecified: Secondary | ICD-10-CM

## 2018-03-01 DIAGNOSIS — R1084 Generalized abdominal pain: Secondary | ICD-10-CM | POA: Diagnosis present

## 2018-03-01 DIAGNOSIS — Z79899 Other long term (current) drug therapy: Secondary | ICD-10-CM | POA: Diagnosis not present

## 2018-03-01 DIAGNOSIS — M545 Low back pain: Secondary | ICD-10-CM | POA: Diagnosis not present

## 2018-03-01 HISTORY — DX: Aortic aneurysm of unspecified site, without rupture: I71.9

## 2018-03-01 HISTORY — DX: Atherosclerotic heart disease of native coronary artery without angina pectoris: I25.10

## 2018-03-01 NOTE — ED Triage Notes (Signed)
Pt reports he has had progressive numbness to right fingers x 4 days.  Also c/o left leg pain which he describes as "gout".  No focal neuro deficit noted.

## 2018-03-01 NOTE — ED Notes (Signed)
Patient c/o left leg and hip pain since 0300 today.  Patient reports hx of gout and states this feels the same.  Patient also c/o numbness in right 3rd-5th fingers with numbness to mid forearm. Patient states numbness is worse at night.

## 2018-03-02 ENCOUNTER — Encounter (HOSPITAL_COMMUNITY): Payer: Self-pay

## 2018-03-02 ENCOUNTER — Emergency Department (HOSPITAL_COMMUNITY)
Admission: EM | Admit: 2018-03-02 | Discharge: 2018-03-02 | Disposition: A | Discharge status: Home / Self Care | Payer: Medicare Other | Attending: Emergency Medicine | Admitting: Emergency Medicine

## 2018-03-02 ENCOUNTER — Emergency Department (HOSPITAL_COMMUNITY): Payer: Medicare Other

## 2018-03-02 DIAGNOSIS — E119 Type 2 diabetes mellitus without complications: Secondary | ICD-10-CM | POA: Insufficient documentation

## 2018-03-02 DIAGNOSIS — F17210 Nicotine dependence, cigarettes, uncomplicated: Secondary | ICD-10-CM | POA: Insufficient documentation

## 2018-03-02 DIAGNOSIS — Z79899 Other long term (current) drug therapy: Secondary | ICD-10-CM | POA: Insufficient documentation

## 2018-03-02 DIAGNOSIS — Z794 Long term (current) use of insulin: Secondary | ICD-10-CM | POA: Insufficient documentation

## 2018-03-02 DIAGNOSIS — M545 Low back pain, unspecified: Secondary | ICD-10-CM

## 2018-03-02 DIAGNOSIS — I2510 Atherosclerotic heart disease of native coronary artery without angina pectoris: Secondary | ICD-10-CM | POA: Insufficient documentation

## 2018-03-02 DIAGNOSIS — I119 Hypertensive heart disease without heart failure: Secondary | ICD-10-CM | POA: Insufficient documentation

## 2018-03-02 LAB — CBC WITH DIFFERENTIAL/PLATELET
Basophils Absolute: 0 10*3/uL (ref 0.0–0.1)
Basophils Relative: 0 %
Eosinophils Absolute: 0 10*3/uL (ref 0.0–0.7)
Eosinophils Relative: 0 %
HCT: 42.2 % (ref 39.0–52.0)
Hemoglobin: 14 g/dL (ref 13.0–17.0)
Lymphocytes Relative: 10 %
Lymphs Abs: 0.8 10*3/uL (ref 0.7–4.0)
MCH: 30.6 pg (ref 26.0–34.0)
MCHC: 33.2 g/dL (ref 30.0–36.0)
MCV: 92.3 fL (ref 78.0–100.0)
Monocytes Absolute: 0.1 10*3/uL (ref 0.1–1.0)
Monocytes Relative: 2 %
Neutro Abs: 6.5 10*3/uL (ref 1.7–7.7)
Neutrophils Relative %: 88 %
Platelets: 208 10*3/uL (ref 150–400)
RBC: 4.57 MIL/uL (ref 4.22–5.81)
RDW: 13.3 % (ref 11.5–15.5)
WBC: 7.4 10*3/uL (ref 4.0–10.5)

## 2018-03-02 LAB — BASIC METABOLIC PANEL
Anion gap: 12 (ref 5–15)
BUN: 13 mg/dL (ref 6–20)
CO2: 24 mmol/L (ref 22–32)
Calcium: 9.1 mg/dL (ref 8.9–10.3)
Chloride: 98 mmol/L — ABNORMAL LOW (ref 101–111)
Creatinine, Ser: 0.96 mg/dL (ref 0.61–1.24)
GFR calc Af Amer: >60 mL/min (ref >60)
GFR calc non Af Amer: >60 mL/min (ref >60)
Glucose, Bld: 380 mg/dL — ABNORMAL HIGH (ref 65–99)
Potassium: 4.2 mmol/L (ref 3.5–5.1)
Sodium: 134 mmol/L — ABNORMAL LOW (ref 135–145)

## 2018-03-02 LAB — URINALYSIS, ROUTINE W REFLEX MICROSCOPIC
Bacteria, UA: NONE SEEN
Bilirubin Urine: NEGATIVE
Glucose, UA: >=500 mg/dL — AB
Ketones, ur: NEGATIVE mg/dL
Leukocytes, UA: NEGATIVE
Nitrite: NEGATIVE
Protein, ur: 30 mg/dL — AB
Specific Gravity, Urine: 1.023 (ref 1.005–1.030)
pH: 6 (ref 5.0–8.0)

## 2018-03-02 LAB — CBG MONITORING, ED: GLUCOSE-CAPILLARY: 157 mg/dL — AB (ref 65–99)

## 2018-03-02 MED ORDER — DEXAMETHASONE SODIUM PHOSPHATE 10 MG/ML IJ SOLN
10.0000 mg | Freq: Once | INTRAMUSCULAR | Status: AC
  Administered 2018-03-02: 10 mg via INTRAMUSCULAR
  Filled 2018-03-02: qty 1

## 2018-03-02 MED ORDER — ACETAMINOPHEN 325 MG PO TABS
650.0000 mg | ORAL_TABLET | Freq: Once | ORAL | Status: AC
  Administered 2018-03-02: 650 mg via ORAL
  Filled 2018-03-02: qty 2

## 2018-03-02 MED ORDER — PREDNISONE 20 MG PO TABS: ORAL_TABLET | ORAL | 0 refills | Status: DC

## 2018-03-02 NOTE — Discharge Instructions (Signed)
Continue your colchicine twice a day.  Take this prednisone until gone.  Use ice and heat for comfort.  Please look at the low purine diet to avoid things that will make you have a flareup.  Please watch your diabetic diet while taking the prednisone and monitor your blood sugars closely.  You will need to see your primary care doctor to get more of your oxycodone if you have run out.

## 2018-03-02 NOTE — ED Provider Notes (Signed)
[facility] EMERGENCY DEPARTMENT Provider Note   CSN: [id number] Arrival date & time: 03/01/18  1830  Time seen 12:50 AM   History   Chief Complaint Chief Complaint  Patient presents with  . Numbness  . Leg Pain    HPI Thomas Donovan is a 57 y.o. male.  HPI patient states his left knee started swelling 2 or 3 days ago and it feels tight when he tries to bend his knee although he is able to show me how he can flex it almost 90 degrees.  He states it throbs like a toothache.  He states he has been having trouble sleeping.  He has not been aware of any fever.  He states he has had this before and he was diagnosed of gout.  He states he has had arthrocentesis about 10-12 times.  He states about 6 weeks ago his doctor told him to start taking colchicine 2 tablets a day which he is done.  Yesterday he took 3.  PC[facility] Katie at [facility]   Past Medical History:  Diagnosis Date  . Aortic aneurysm (HCC)   . Chronic back pain   . Cold    recent rx  . Coronary artery disease   . Degeneration of lumbar intervertebral disc   . Diabetes mellitus   . GERD (gastroesophageal reflux disease)   . Gout   . History of kidney stones   . Hypertension   . Pneumonia     Patient Active Problem List   Diagnosis Date Noted  . Tobacco use 08/22/2017  . Hypokalemia 08/22/2017  . GERD (gastroesophageal reflux disease) 08/22/2017  . Type 2 diabetes mellitus with other specified complication (HCC) 08/22/2017  . Syncope 08/21/2017  . Chest pain 08/21/2017  . Dyspnea 08/21/2017  . Hematochezia 08/21/2017  . 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 BACK SURGERY    . MAXIMUM ACCESS (MAS)POSTERIOR LUMBAR INTERBODY FUSION (PLIF) 1 LEVEL  11/20/2013   Procedure: FOR MAXIMUM ACCESS (MAS) POSTERIOR LUMBAR INTERBODY FUSION LUMBAR FIVE-SACRAL ONE;  Surgeon: Tia Alertavid S Jones, MD;  Location: [facility];  Service: Neurosurgery;;  FOR MAXIMUM ACCESS (MAS)  POSTERIOR LUMBAR INTERBODY FUSION LUMBAR FIVE-SACRAL ONE  . TONSILLECTOMY         Home Medications    Prior to Admission medications   Medication Sig Start Date End Date Taking? Authorizing Provider  colchicine 0.6 MG tablet Take 1-2 tablets (0.6-1.2 mg total) by mouth 2 (two) times daily as needed (for gout flares). 08/23/17  Yes Standley BrookingGoodrich, Daniel P, MD  gabapentin (NEURONTIN) 800 MG tablet Take 800 mg by mouth 4 (four) times daily. 02/23/18  Yes [provider]  Insulin Glargine (LANTUS SOLOSTAR) 100 UNIT/ML Solostar Pen Inject 60 Units into the skin every morning.  02/06/18  Yes [provider]  insulin lispro (HUMALOG) 100 UNIT/ML injection Inject into the skin 3 (three) times daily before meals. Based on blood sugar levels   Yes [provider]  losartan (COZAAR) 50 MG tablet Take 50 mg by mouth daily.  01/01/18  Yes [provider]  metFORMIN (GLUCOPHAGE-XR) 500 MG 24 hr tablet Take 1-2 tablets (500-1,000 mg total) by mouth 2 (two) times daily. Pt takes two tablets in the morning and one at bedtime. 08/23/17  Yes Standley BrookingGoodrich, Daniel P, MD  omeprazole (PRILOSEC) 40 MG capsule Take 40 mg by mouth daily.  11/25/17  Yes [provider]  oxyCODONE-acetaminophen (PERCOCET) 7.5-325 MG tablet  Take 1 tablet by mouth every 6 (six) hours as needed for moderate pain.  03/08/18 04/07/18 Yes [provider]  predniSONE (DELTASONE) 20 MG tablet Take 3 po QD x 3d , then 2 po QD x 3d then 1 po QD x 3d 03/02/18   Devoria Albe, MD    Family History History reviewed. No pertinent family history.  Social History Social History   Tobacco Use  . Smoking status: Current Every Day Smoker    Packs/day: 0.50    Years: 10.00    Pack years: 5.00    Types: Cigarettes  . Smokeless tobacco: Never Used  Substance Use Topics  . Alcohol use: Yes    Alcohol/week: 7.2 oz    Types: 12 Cans of beer per week    Comment: every 2 or 3 days  . Drug use: No  on disability for  back surgery and rotator cuff injury right shoulder   Allergies   Aspirin; Ibuprofen; Toradol [ketorolac tromethamine]; and Tramadol   Review of Systems Review of Systems  All other systems reviewed and are negative.    Physical Exam Updated Vital Signs BP (!) 164/90   Pulse 90   Temp 98 F (36.7 C) (Oral)   Resp 17   Ht 6' (1.829 m)   Wt 131.5 kg (290 lb)   SpO2 97%   BMI 39.33 kg/m   Vital signs normal    Physical Exam  Constitutional: He is oriented to person, place, and time. He appears well-developed and well-nourished.  Non-toxic appearance. He does not appear ill. No distress.  HENT:  Head: Normocephalic and atraumatic.  Right Ear: External ear normal.  Left Ear: External ear normal.  Nose: Nose normal.  Mouth/Throat: No uvula swelling.  Eyes: Conjunctivae and EOM are normal. Pupils are equal, round, and reactive to light.  Neck: Normal range of motion and full passive range of motion without pain. Neck supple.  Cardiovascular: Normal rate.  Pulmonary/Chest: Effort normal. No respiratory distress. He has no rhonchi. He exhibits no crepitus.  Abdominal: Normal appearance.  Musculoskeletal: Normal range of motion. He exhibits edema and tenderness.  Moves all extremities well.  Patient has mild diffuse enlargement of the left knee compared to the right with questionable effusion.  When I palpate his patella there is a clicking sound.  There is no redness or warmth to the skin.  Neurological: He is alert and oriented to person, place, and time. He has normal strength. No cranial nerve deficit.  Skin: Skin is warm, dry and intact. No rash noted. No erythema. No pallor.  Psychiatric: He has a normal mood and affect. His speech is normal and behavior is normal. His mood appears not anxious.  Nursing note and vitals reviewed.    ED Treatments / Results  Labs (all labs ordered are listed, but only abnormal results are displayed) Results for orders placed or  performed during the hospital encounter of 03/01/18  CBG monitoring, ED  Result Value Ref Range   Glucose-Capillary 157 (H) 65 - 99 mg/dL      EKG  EKG Interpretation None       Radiology No results found.  Procedures Procedures (including critical care time)  Medications Ordered in ED Medications  dexamethasone (DECADRON) injection 10 mg (not administered)     Initial Impression / Assessment and Plan / ED Course  I have reviewed the triage vital signs and the nursing notes.  Pertinent labs & imaging results that were available during my care of  the patient were reviewed by me and considered in my medical decision making (see chart for details).      Patient states his CBG is normally run around 200.  I was going to give him Toradol and reviewed his last blood work which was December 01, 2017 where he had a normal BUN and creatinine however he has multiple allergies to anti-inflammatories.  He was given Decadron IM.  His CBG was checked.  Patient was extremely somnolent and nurse had to do a sternal rub earlier to get him to wake up.  Patient really is trying to get me to give him pain medication.  However I reviewed the [facility] and he should have pain pills at home.  Please note his wife is also in the ED and she also was very somnolent and hard to keep awake.  We discussed alcohol can make him have an attack.  He should continue his colchicine.   Review of the [place] shows patient gets #120 oxycodone 7.5/325 monthly, last filled February 12 and January 10.  He got #30 lorazepam 0.5 mg on January 7 and 5 tablets on January 14.  He also has gotten multiple small quantities of hydrocodone 5/325 and October and November mainly from our emergency department.  His overdose risk score is 750.  Final Clinical Impressions(s) / ED Diagnoses   Final diagnoses:  Acute gout of left knee, unspecified cause    ED Discharge Orders        Ordered     predniSONE (DELTASONE) 20 MG tablet     03/02/18 0142     Plan discharge  Devoria Albe, MD, Concha Pyo, MD 03/02/18 [id number]

## 2018-03-02 NOTE — ED Triage Notes (Addendum)
Pt reports that he has left flank pain increasing since last night. Reports he was seen here yesterday for gout. Feet swollen this morning. Red blood in stool approx 3 hrs ago, has had a BM since with no blood

## 2018-03-02 NOTE — ED Notes (Signed)
Pt transported to xray 

## 2018-03-02 NOTE — ED Notes (Signed)
Pt given diet soda and crackers.  

## 2018-03-02 NOTE — ED Notes (Signed)
Pt called out asking for blanket. Nurse to provide blanket.

## 2018-03-08 NOTE — ED Provider Notes (Signed)
[facility] EMERGENCY DEPARTMENT Provider Note   CSN: [id number] Arrival date & time: 03/02/18  [id number]     History   Chief Complaint Chief Complaint  Patient presents with  . Flank Pain    HPI Marylou MccoyRonald R Beier is a 57 y.o. male.  HPI   57 year old male with left flank/right lower back pain.  Patient was actually seen here earlier for gout.  Apparently he did not mention this pain at that time.  He states that when he left he began having severe flank pain.  He thinks he may have a kidney stone.  Initially reports he had hematuria which has since resolved.  Mild nausea.  Past Medical History:  Diagnosis Date  . Aortic aneurysm (HCC)   . Chronic back pain   . Cold    recent rx  . Coronary artery disease   . Degeneration of lumbar intervertebral disc   . Diabetes mellitus   . GERD (gastroesophageal reflux disease)   . Gout   . History of kidney stones   . Hypertension   . Pneumonia     Patient Active Problem List   Diagnosis Date Noted  . Tobacco use 08/22/2017  . Hypokalemia 08/22/2017  . GERD (gastroesophageal reflux disease) 08/22/2017  . Type 2 diabetes mellitus with other specified complication (HCC) 08/22/2017  . Syncope 08/21/2017  . Chest pain 08/21/2017  . Dyspnea 08/21/2017  . Hematochezia 08/21/2017  . 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 BACK SURGERY    . MAXIMUM ACCESS (MAS)POSTERIOR LUMBAR INTERBODY FUSION (PLIF) 1 LEVEL  11/20/2013   Procedure: FOR MAXIMUM ACCESS (MAS) POSTERIOR LUMBAR INTERBODY FUSION LUMBAR FIVE-SACRAL ONE;  Surgeon: Tia Alertavid S Jones, MD;  Location: [facility];  Service: Neurosurgery;;  FOR MAXIMUM ACCESS (MAS) POSTERIOR LUMBAR INTERBODY FUSION LUMBAR FIVE-SACRAL ONE  . TONSILLECTOMY         Home Medications    Prior to Admission medications   Medication Sig Start Date End Date Taking? Authorizing Provider  colchicine 0.6 MG tablet Take 1-2 tablets (0.6-1.2 mg total) by  mouth 2 (two) times daily as needed (for gout flares). 08/23/17   Standley BrookingGoodrich, Daniel P, MD  gabapentin (NEURONTIN) 800 MG tablet Take 800 mg by mouth 4 (four) times daily. 02/23/18   [provider]  Insulin Glargine (LANTUS SOLOSTAR) 100 UNIT/ML Solostar Pen Inject 60 Units into the skin every morning.  02/06/18   [provider]  insulin lispro (HUMALOG) 100 UNIT/ML injection Inject into the skin 3 (three) times daily before meals. Based on blood sugar levels    [provider]  losartan (COZAAR) 50 MG tablet Take 50 mg by mouth daily.  01/01/18   [provider]  metFORMIN (GLUCOPHAGE-XR) 500 MG 24 hr tablet Take 1-2 tablets (500-1,000 mg total) by mouth 2 (two) times daily. Pt takes two tablets in the morning and one at bedtime. 08/23/17   Standley BrookingGoodrich, Daniel P, MD  omeprazole (PRILOSEC) 40 MG capsule Take 40 mg by mouth daily.  11/25/17   [provider]  oxyCODONE-acetaminophen (PERCOCET) 7.5-325 MG tablet Take 1 tablet by mouth every 6 (six) hours as needed for moderate pain.  03/08/18 04/07/18  [provider]  predniSONE (DELTASONE) 20 MG tablet Take 3 po QD x 3d , then 2 po QD x 3d then 1 po QD x 3d 03/02/18   Devoria AlbeKnapp, Iva, MD    Family History No family history on file.  Social  History Social History   Tobacco Use  . Smoking status: Current Every Day Smoker    Packs/day: 0.50    Years: 10.00    Pack years: 5.00    Types: Cigarettes  . Smokeless tobacco: Never Used  Substance Use Topics  . Alcohol use: Yes    Alcohol/week: 7.2 oz    Types: 12 Cans of beer per week    Comment: every 2 or 3 days  . Drug use: No     Allergies   Aspirin; Ibuprofen; Toradol [ketorolac tromethamine]; and Tramadol   Review of Systems Review of Systems All systems reviewed and negative, other than as noted in HPI.   Physical Exam Updated Vital Signs BP (!) 135/99 (BP Location: Left Arm)   Pulse 95   Temp 97.8 F (36.6 C) (Oral)   Resp 19   Wt 131.5  kg (290 lb)   SpO2 96%   BMI 39.33 kg/m   Physical Exam  Constitutional: He appears well-developed and well-nourished. No distress.  HENT:  Head: Normocephalic and atraumatic.  Eyes: Conjunctivae are normal. Right eye exhibits no discharge. Left eye exhibits no discharge.  Neck: Neck supple.  Cardiovascular: Normal rate, regular rhythm and normal heart sounds. Exam reveals no gallop and no friction rub.  No murmur heard. Pulmonary/Chest: Effort normal and breath sounds normal. No respiratory distress.  Abdominal: Soft. He exhibits no distension. There is no tenderness.  Musculoskeletal: He exhibits no edema or tenderness.  No midline spinal tenderness.  Strength is 5 out of 5 bilateral lower extremities.  Sensation is intact to light touch.  Neurological: He is alert.  Skin: Skin is warm and dry.  Psychiatric: He has a normal mood and affect. His behavior is normal. Thought content normal.  Nursing note and vitals reviewed.    ED Treatments / Results  Labs (all labs ordered are listed, but only abnormal results are displayed) Labs Reviewed  URINALYSIS, ROUTINE W REFLEX MICROSCOPIC - Abnormal; Notable for the following components:      Result Value   Glucose, UA >=500 (*)    Hgb urine dipstick SMALL (*)    Protein, ur 30 (*)    Squamous Epithelial / LPF 0-5 (*)    All other components within normal limits  BASIC METABOLIC PANEL - Abnormal; Notable for the following components:   Sodium 134 (*)    Chloride 98 (*)    Glucose, Bld 380 (*)    All other components within normal limits  CBC WITH DIFFERENTIAL/PLATELET    EKG  EKG Interpretation None       Radiology No results found.   Ct Renal Stone Study  Result Date: 03/02/2018 CLINICAL DATA:  Left flank pain since last night. Blood in stool 3 hours ago. EXAM: CT ABDOMEN AND PELVIS WITHOUT CONTRAST TECHNIQUE: Multidetector CT imaging of the abdomen and pelvis was performed following the standard protocol without IV  contrast. COMPARISON:  CTA of the abdomen and pelvis of 4 days ago. FINDINGS: Lower chest: Bibasilar scarring. Normal heart size without pericardial or pleural effusion. Hepatobiliary: Mild hepatic steatosis. Hepatomegaly at 20.7 cm craniocaudal. Caudate lobe enlargement. Normal gallbladder, without biliary ductal dilatation. Pancreas: Normal, without mass or ductal dilatation. Spleen: Normal in size, without focal abnormality. Adrenals/Urinary Tract: Normal adrenal glands. Interpolar are left renal cyst or cysts. Bilateral renal collecting system calculi. The largest stone is left lower pole and measures 10 mm. No hydronephrosis. No hydroureter or ureteric calculi. No bladder calculi. Stomach/Bowel: Normal stomach, without wall thickening.  Scattered colonic diverticula. Normal terminal ileum and appendix. Vascular/Lymphatic: Normal caliber of the aorta and branch vessels. No abdominopelvic adenopathy. Reproductive: Normal prostate. Other: No significant free fluid. Small fat containing inguinal hernias. Musculoskeletal: Lumbosacral spine fixation. IMPRESSION: 1. Bilateral nephrolithiasis, without obstructive uropathy. 2. Hepatomegaly and mild hepatic steatosis. Caudate lobe enlargement is nonspecific but can be seen with mild cirrhosis. Correlate with risk factors for liver disease. Electronically Signed   By: Jeronimo Greaves M.D.   On: 03/02/2018 08:33    Procedures Procedures (including critical care time)  Medications Ordered in ED Medications  acetaminophen (TYLENOL) tablet 650 mg (650 mg Oral Given 03/02/18 1610)     Initial Impression / Assessment and Plan / ED Course  I have reviewed the triage vital signs and the nursing notes.  Pertinent labs & imaging results that were available during my care of the patient were reviewed by me and considered in my medical decision making (see chart for details).    57 year old male with left flank/back pain.  Multiple red flags in his story.  He was seen in  the emergency room a few hours earlier did not mention this complaint.  Nothing objective on exam.  Has multiple requests for pain medication.  Allergies to multiple medications including NSAIDs.  He was advised to take Tylenol.  I doubt emergent condition.  Final Clinical Impressions(s) / ED Diagnoses   Final diagnoses:  Left-sided low back pain without sciatica, unspecified chronicity    ED Discharge Orders    None       Raeford Razor, MD 03/08/18 [phone]

## 2018-06-02 ENCOUNTER — Emergency Department (HOSPITAL_COMMUNITY)
Admission: EM | Admit: 2018-06-02 | Discharge: 2018-06-02 | Disposition: A | Discharge status: Home / Self Care | Payer: Medicare Other | Attending: Emergency Medicine | Admitting: Emergency Medicine

## 2018-06-02 ENCOUNTER — Encounter (HOSPITAL_COMMUNITY): Payer: Self-pay | Admitting: Emergency Medicine

## 2018-06-02 ENCOUNTER — Emergency Department (HOSPITAL_COMMUNITY): Payer: Medicare Other

## 2018-06-02 DIAGNOSIS — Z794 Long term (current) use of insulin: Secondary | ICD-10-CM | POA: Insufficient documentation

## 2018-06-02 DIAGNOSIS — I10 Essential (primary) hypertension: Secondary | ICD-10-CM | POA: Insufficient documentation

## 2018-06-02 DIAGNOSIS — I2510 Atherosclerotic heart disease of native coronary artery without angina pectoris: Secondary | ICD-10-CM | POA: Diagnosis not present

## 2018-06-02 DIAGNOSIS — R109 Unspecified abdominal pain: Secondary | ICD-10-CM

## 2018-06-02 DIAGNOSIS — F17210 Nicotine dependence, cigarettes, uncomplicated: Secondary | ICD-10-CM | POA: Insufficient documentation

## 2018-06-02 DIAGNOSIS — E119 Type 2 diabetes mellitus without complications: Secondary | ICD-10-CM | POA: Diagnosis not present

## 2018-06-02 DIAGNOSIS — Z79899 Other long term (current) drug therapy: Secondary | ICD-10-CM | POA: Diagnosis not present

## 2018-06-02 DIAGNOSIS — R1011 Right upper quadrant pain: Secondary | ICD-10-CM | POA: Insufficient documentation

## 2018-06-02 LAB — URINALYSIS, ROUTINE W REFLEX MICROSCOPIC
BILIRUBIN URINE: NEGATIVE
Bacteria, UA: NONE SEEN
GLUCOSE, UA: 50 mg/dL — AB
KETONES UR: NEGATIVE mg/dL
Leukocytes, UA: NEGATIVE
NITRITE: NEGATIVE
PH: 5 (ref 5.0–8.0)
Protein, ur: 30 mg/dL — AB
Specific Gravity, Urine: 1.015 (ref 1.005–1.030)

## 2018-06-02 LAB — BASIC METABOLIC PANEL
Anion gap: 9 (ref 5–15)
BUN: 16 mg/dL (ref 6–20)
CALCIUM: 9.2 mg/dL (ref 8.9–10.3)
CO2: 26 mmol/L (ref 22–32)
Chloride: 105 mmol/L (ref 101–111)
Creatinine, Ser: 1.14 mg/dL (ref 0.61–1.24)
GFR calc Af Amer: >60 mL/min (ref >60)
GLUCOSE: 215 mg/dL — AB (ref 65–99)
Potassium: 4.1 mmol/L (ref 3.5–5.1)
SODIUM: 140 mmol/L (ref 135–145)

## 2018-06-02 LAB — CBC
HCT: 42.4 % (ref 39.0–52.0)
Hemoglobin: 14.1 g/dL (ref 13.0–17.0)
MCH: 31.2 pg (ref 26.0–34.0)
MCHC: 33.3 g/dL (ref 30.0–36.0)
MCV: 93.8 fL (ref 78.0–100.0)
PLATELETS: 235 10*3/uL (ref 150–400)
RBC: 4.52 MIL/uL (ref 4.22–5.81)
RDW: 14.9 % (ref 11.5–15.5)
WBC: 8.9 10*3/uL (ref 4.0–10.5)

## 2018-06-02 MED ORDER — HYDROMORPHONE HCL 1 MG/ML IJ SOLN
1.0000 mg | Freq: Once | INTRAMUSCULAR | Status: AC
  Administered 2018-06-02: 1 mg via INTRAVENOUS
  Filled 2018-06-02: qty 1

## 2018-06-02 MED ORDER — HYDROMORPHONE HCL 1 MG/ML IJ SOLN
0.5000 mg | Freq: Once | INTRAMUSCULAR | Status: AC
  Administered 2018-06-02: 0.5 mg via INTRAVENOUS
  Filled 2018-06-02: qty 1

## 2018-06-02 MED ORDER — METHYLPREDNISOLONE SODIUM SUCC 125 MG IJ SOLR
125.0000 mg | Freq: Once | INTRAMUSCULAR | Status: AC
  Administered 2018-06-02: 125 mg via INTRAVENOUS
  Filled 2018-06-02: qty 2

## 2018-06-02 MED ORDER — ONDANSETRON HCL 4 MG/2ML IJ SOLN
4.0000 mg | Freq: Once | INTRAMUSCULAR | Status: AC
  Administered 2018-06-02: 4 mg via INTRAVENOUS
  Filled 2018-06-02: qty 2

## 2018-06-02 MED ORDER — CYCLOBENZAPRINE HCL 10 MG PO TABS: 10.0000 mg | ORAL_TABLET | Freq: Three times a day (TID) | ORAL | 0 refills | Status: DC

## 2018-06-02 NOTE — ED Notes (Addendum)
Pt reports hematuria for the "last 6 months" has [facility] [place]  Reports has not followed recently due to an unfortunate incarceration in which they did nothing for his chronic pain nor did they do a workup for  His hematuria  He reports that he is disabled "because of a number of things"  unable to work

## 2018-06-02 NOTE — ED Notes (Signed)
To CT

## 2018-06-02 NOTE — ED Triage Notes (Signed)
Pt took oxycodone 7.5mg  1 hr pta with no relief.

## 2018-06-02 NOTE — ED Notes (Signed)
Pt sitting calmly in waiting area in no acute distress.  Conversing with another person waiting.

## 2018-06-02 NOTE — ED Provider Notes (Signed)
[facility] EMERGENCY DEPARTMENT Provider Note   CSN: [id number] Arrival date & time: 06/02/18  1224     History   Chief Complaint Chief Complaint  Patient presents with  . Flank Pain    HPI Thomas Donovan is a 57 y.o. male.  Patient complains of right flank pain.  Patient has history of kidney stones.  Patient does not complain of fevers or chills  The history is provided by the patient. No language interpreter was used.  Flank Pain  This is a new problem. The current episode started 12 to 24 hours ago. The problem occurs constantly. The problem has not changed since onset.Pertinent negatives include no chest pain, no abdominal pain and no headaches. The symptoms are aggravated by bending. Nothing relieves the symptoms. He has tried nothing for the symptoms.    Past Medical History:  Diagnosis Date  . Aortic aneurysm (HCC)   . Chronic back pain   . Cold    recent rx  . Coronary artery disease   . Degeneration of lumbar intervertebral disc   . Diabetes mellitus   . GERD (gastroesophageal reflux disease)   . Gout   . History of kidney stones   . Hypertension   . Pneumonia     Patient Active Problem List   Diagnosis Date Noted  . Tobacco use 08/22/2017  . Hypokalemia 08/22/2017  . GERD (gastroesophageal reflux disease) 08/22/2017  . Type 2 diabetes mellitus with other specified complication (HCC) 08/22/2017  . Syncope 08/21/2017  . Chest pain 08/21/2017  . Dyspnea 08/21/2017  . Hematochezia 08/21/2017  . 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 BACK SURGERY    . MAXIMUM ACCESS (MAS)POSTERIOR LUMBAR INTERBODY FUSION (PLIF) 1 LEVEL  11/20/2013   Procedure: FOR MAXIMUM ACCESS (MAS) POSTERIOR LUMBAR INTERBODY FUSION LUMBAR FIVE-SACRAL ONE;  Surgeon: Tia Alert, MD;  Location: [facility];  Service: Neurosurgery;;  FOR MAXIMUM ACCESS (MAS) POSTERIOR LUMBAR INTERBODY FUSION LUMBAR FIVE-SACRAL ONE  .  TONSILLECTOMY          Home Medications    Prior to Admission medications   Medication Sig Start Date End Date Taking? Authorizing Provider  colchicine 0.6 MG tablet Take 1-2 tablets (0.6-1.2 mg total) by mouth 2 (two) times daily as needed (for gout flares). 08/23/17   Standley Brooking, MD  gabapentin (NEURONTIN) 800 MG tablet Take 800 mg by mouth 4 (four) times daily. 02/23/18   [provider]  Insulin Glargine (LANTUS SOLOSTAR) 100 UNIT/ML Solostar Pen Inject 60 Units into the skin every morning.  02/06/18   [provider]  insulin lispro (HUMALOG) 100 UNIT/ML injection Inject into the skin 3 (three) times daily before meals. Based on blood sugar levels    [provider]  losartan (COZAAR) 50 MG tablet Take 50 mg by mouth daily.  01/01/18   [provider]  metFORMIN (GLUCOPHAGE-XR) 500 MG 24 hr tablet Take 1-2 tablets (500-1,000 mg total) by mouth 2 (two) times daily. Pt takes two tablets in the morning and one at bedtime. 08/23/17   Standley Brooking, MD  omeprazole (PRILOSEC) 40 MG capsule Take 40 mg by mouth daily.  11/25/17   [provider]  predniSONE (DELTASONE) 20 MG tablet Take 3 po QD x 3d , then 2 po QD x 3d then 1 po QD x 3d 03/02/18   Devoria Albe, MD    Family History History reviewed. No pertinent family  history.  Social History Social History   Tobacco Use  . Smoking status: Current Every Day Smoker    Packs/day: 0.50    Years: 10.00    Pack years: 5.00    Types: Cigarettes  . Smokeless tobacco: Never Used  Substance Use Topics  . Alcohol use: Yes    Alcohol/week: 7.2 oz    Types: 12 Cans of beer per week    Comment: every 2 or 3 days  . Drug use: No     Allergies   Aspirin; Ibuprofen; Toradol [ketorolac tromethamine]; and Tramadol   Review of Systems Review of Systems  Constitutional: Negative for appetite change and fatigue.  HENT: Negative for congestion, ear discharge and sinus pressure.   Eyes: Negative  for discharge.  Respiratory: Negative for cough.   Cardiovascular: Negative for chest pain.  Gastrointestinal: Negative for abdominal pain and diarrhea.  Genitourinary: Positive for flank pain. Negative for frequency and hematuria.  Musculoskeletal: Negative for back pain.  Skin: Negative for rash.  Neurological: Negative for seizures and headaches.  Psychiatric/Behavioral: Negative for hallucinations.     Physical Exam Updated Vital Signs Ht 6' (1.829 m)   Wt 113.4 kg (250 lb)   BMI 33.91 kg/m   Physical Exam  Constitutional: He is oriented to person, place, and time. He appears well-developed.  HENT:  Head: Normocephalic.  Eyes: Conjunctivae and EOM are normal. No scleral icterus.  Neck: Neck supple. No thyromegaly present.  Cardiovascular: Normal rate and regular rhythm. Exam reveals no gallop and no friction rub.  No murmur heard. Pulmonary/Chest: No stridor. He has no wheezes. He has no rales. He exhibits no tenderness.  Abdominal: He exhibits no distension. There is no tenderness. There is no rebound.  Genitourinary:  Genitourinary Comments: Tender right flank  Musculoskeletal: Normal range of motion. He exhibits no edema.  Lymphadenopathy:    He has no cervical adenopathy.  Neurological: He is oriented to person, place, and time. He exhibits normal muscle tone. Coordination normal.  Skin: No rash noted. No erythema.  Psychiatric: He has a normal mood and affect. His behavior is normal.     ED Treatments / Results  Labs (all labs ordered are listed, but only abnormal results are displayed) Labs Reviewed  BASIC METABOLIC PANEL - Abnormal; Notable for the following components:      Result Value   Glucose, Bld 215 (*)    All other components within normal limits  CBC  URINALYSIS, ROUTINE W REFLEX MICROSCOPIC    EKG None  Radiology No results found.  Procedures Procedures (including critical care time)  Medications Ordered in ED Medications    HYDROmorphone (DILAUDID) injection 1 mg (has no administration in time range)  ondansetron (ZOFRAN) injection 4 mg (has no administration in time range)     Initial Impression / Assessment and Plan / ED Course  I have reviewed the triage vital signs and the nursing notes.  Pertinent labs & imaging results that were available during my care of the patient were reviewed by me and considered in my medical decision making (see chart for details).     Labs unremarkable except for mildly elevated glucose.  Patient does have a history of diabetes.  CT scan shows no kidney stones in the ureters.  Suspect musculoskeletal pain for his discomfort.  Patient is already taking pain medicines so he will be placed on some muscle relaxers and given 1 shot of Solu-Medrol and will follow-up with his PCP  Final Clinical Impressions(s) / ED  Diagnoses   Final diagnoses:  None    ED Discharge Orders    None       Bethann BerkshireZammit, Kiyoko Mcguirt, MD 06/02/18 1725

## 2018-06-02 NOTE — Discharge Instructions (Signed)
Follow-up with your family doctor next week for recheck. 

## 2018-06-02 NOTE — ED Notes (Signed)
Pt with history of chronic pain   Here with complaint of flank pain

## 2018-06-02 NOTE — ED Notes (Signed)
From CT requesting TV be turned up

## 2018-06-02 NOTE — ED Triage Notes (Signed)
Pt reports right flank pain x 3 days with hematuria x2 days.

## 2018-07-03 ENCOUNTER — Emergency Department (HOSPITAL_COMMUNITY): Payer: Medicare Other

## 2018-07-03 ENCOUNTER — Other Ambulatory Visit: Payer: Self-pay

## 2018-07-03 ENCOUNTER — Emergency Department (HOSPITAL_COMMUNITY)
Admission: EM | Admit: 2018-07-03 | Discharge: 2018-07-03 | Disposition: A | Discharge status: Home / Self Care | Payer: Medicare Other | Attending: Emergency Medicine | Admitting: Emergency Medicine

## 2018-07-03 DIAGNOSIS — R0789 Other chest pain: Secondary | ICD-10-CM | POA: Diagnosis not present

## 2018-07-03 DIAGNOSIS — R079 Chest pain, unspecified: Secondary | ICD-10-CM | POA: Diagnosis present

## 2018-07-03 DIAGNOSIS — I10 Essential (primary) hypertension: Secondary | ICD-10-CM | POA: Insufficient documentation

## 2018-07-03 DIAGNOSIS — F17210 Nicotine dependence, cigarettes, uncomplicated: Secondary | ICD-10-CM | POA: Diagnosis not present

## 2018-07-03 DIAGNOSIS — Z7984 Long term (current) use of oral hypoglycemic drugs: Secondary | ICD-10-CM | POA: Diagnosis not present

## 2018-07-03 DIAGNOSIS — E119 Type 2 diabetes mellitus without complications: Secondary | ICD-10-CM | POA: Diagnosis not present

## 2018-07-03 DIAGNOSIS — Z79899 Other long term (current) drug therapy: Secondary | ICD-10-CM | POA: Insufficient documentation

## 2018-07-03 DIAGNOSIS — I2510 Atherosclerotic heart disease of native coronary artery without angina pectoris: Secondary | ICD-10-CM | POA: Diagnosis not present

## 2018-07-03 LAB — HEPATIC FUNCTION PANEL
ALBUMIN: 3.7 g/dL (ref 3.5–5.0)
ALK PHOS: 88 U/L (ref 38–126)
ALT: 22 U/L (ref 0–44)
AST: 24 U/L (ref 15–41)
BILIRUBIN INDIRECT: 0.4 mg/dL (ref 0.3–0.9)
Bilirubin, Direct: 0.2 mg/dL (ref 0.0–0.2)
TOTAL PROTEIN: 6.1 g/dL — AB (ref 6.5–8.1)
Total Bilirubin: 0.6 mg/dL (ref 0.3–1.2)

## 2018-07-03 LAB — BASIC METABOLIC PANEL
Anion gap: 13 (ref 5–15)
BUN: 13 mg/dL (ref 6–20)
CALCIUM: 8.5 mg/dL — AB (ref 8.9–10.3)
CO2: 20 mmol/L — ABNORMAL LOW (ref 22–32)
CREATININE: 1.04 mg/dL (ref 0.61–1.24)
Chloride: 107 mmol/L (ref 98–111)
Glucose, Bld: 194 mg/dL — ABNORMAL HIGH (ref 70–99)
Potassium: 3.9 mmol/L (ref 3.5–5.1)
SODIUM: 140 mmol/L (ref 135–145)

## 2018-07-03 LAB — CBC
HCT: 39.9 % (ref 39.0–52.0)
Hemoglobin: 13.9 g/dL (ref 13.0–17.0)
MCH: 32.7 pg (ref 26.0–34.0)
MCHC: 34.8 g/dL (ref 30.0–36.0)
MCV: 93.9 fL (ref 78.0–100.0)
PLATELETS: 206 10*3/uL (ref 150–400)
RBC: 4.25 MIL/uL (ref 4.22–5.81)
RDW: 13.4 % (ref 11.5–15.5)
WBC: 11.8 10*3/uL — ABNORMAL HIGH (ref 4.0–10.5)

## 2018-07-03 LAB — I-STAT TROPONIN, ED
TROPONIN I, POC: 0.01 ng/mL (ref 0.00–0.08)
Troponin i, poc: 0 ng/mL (ref 0.00–0.08)

## 2018-07-03 LAB — LIPASE, BLOOD: LIPASE: 41 U/L (ref 11–51)

## 2018-07-03 MED ORDER — ONDANSETRON HCL 4 MG/2ML IJ SOLN
4.0000 mg | Freq: Once | INTRAMUSCULAR | Status: AC
  Administered 2018-07-03: 4 mg via INTRAVENOUS
  Filled 2018-07-03: qty 2

## 2018-07-03 MED ORDER — SODIUM CHLORIDE 0.9 % IV BOLUS
1000.0000 mL | Freq: Once | INTRAVENOUS | Status: AC
  Administered 2018-07-03: 1000 mL via INTRAVENOUS

## 2018-07-03 MED ORDER — FENTANYL CITRATE (PF) 100 MCG/2ML IJ SOLN
50.0000 ug | Freq: Once | INTRAMUSCULAR | Status: AC
  Administered 2018-07-03: 50 ug via INTRAVENOUS
  Filled 2018-07-03: qty 2

## 2018-07-03 MED ORDER — GI COCKTAIL ~~LOC~~
30.0000 mL | Freq: Once | ORAL | Status: AC
  Administered 2018-07-03: 30 mL via ORAL
  Filled 2018-07-03: qty 30

## 2018-07-03 MED ORDER — IOPAMIDOL (ISOVUE-370) INJECTION 76%
100.0000 mL | Freq: Once | INTRAVENOUS | Status: AC | PRN
  Administered 2018-07-03: 100 mL via INTRAVENOUS

## 2018-07-03 NOTE — ED Notes (Signed)
Family at bedside. 

## 2018-07-03 NOTE — ED Provider Notes (Signed)
[facility] EMERGENCY DEPARTMENT Provider Note  CSN: [id number] Arrival date & time: 07/03/18 4098  Chief Complaint(s) Chest Pain  HPI Thomas Donovan is a 57 y.o. male   The history is provided by the patient.  Chest Pain   This is a recurrent problem. The current episode started 1 to 2 hours ago. The problem occurs constantly. The problem has not changed since onset.The pain is present in the substernal region. The pain is severe. The quality of the pain is described as sharp, stabbing and pressure-like. The pain radiates to the upper back, left neck and left shoulder. The symptoms are aggravated by exertion. Associated symptoms include back pain, diaphoresis, dizziness, malaise/fatigue, nausea, near-syncope, shortness of breath and vomiting. Risk factors include alcohol intake.  His past medical history is significant for aortic aneurysm (reported AAA; CTA in Aug was negative).  Pertinent negatives for past medical history include no diabetes, no hyperlipidemia and no hypertension.   Reports drinking 3 beers earlier in the day.   Lexiscan obtain August 2008 revealed low risk for CAD. Past Medical History Past Medical History:  Diagnosis Date  . Aortic aneurysm (HCC)   . Chronic back pain   . Cold    recent rx  . Coronary artery disease   . Degeneration of lumbar intervertebral disc   . Diabetes mellitus   . GERD (gastroesophageal reflux disease)   . Gout   . History of kidney stones   . Hypertension   . Pneumonia    Patient Active Problem List   Diagnosis Date Noted  . Tobacco use 08/22/2017  . Hypokalemia 08/22/2017  . GERD (gastroesophageal reflux disease) 08/22/2017  . Type 2 diabetes mellitus with other specified complication (HCC) 08/22/2017  . Syncope 08/21/2017  . Chest pain 08/21/2017  . Dyspnea 08/21/2017  . Hematochezia 08/21/2017  . S/P lumbar spinal fusion 11/20/2013   Home Medication(s) Prior to Admission medications   Medication  Sig Start Date End Date Taking? Authorizing Provider  colchicine 0.6 MG tablet Take 1-2 tablets (0.6-1.2 mg total) by mouth 2 (two) times daily as needed (for gout flares). 08/23/17  Yes Standley Brooking, MD  gabapentin (NEURONTIN) 800 MG tablet Take 800 mg by mouth 4 (four) times daily. 02/23/18  Yes [provider]  metFORMIN (GLUCOPHAGE-XR) 500 MG 24 hr tablet Take 1-2 tablets (500-1,000 mg total) by mouth 2 (two) times daily. Pt takes two tablets in the morning and one at bedtime. Patient taking differently: Take 500 mg by mouth 2 (two) times daily. Pt takes two tablets in the morning and one at bedtime. 08/23/17  Yes Standley Brooking, MD  omeprazole (PRILOSEC) 40 MG capsule Take 40 mg by mouth daily.  11/25/17  Yes [provider]  oxyCODONE-acetaminophen (PERCOCET) 7.5-325 MG tablet Take 1 tablet by mouth 3 (three) times daily.   Yes [provider]  cyclobenzaprine (FLEXERIL) 10 MG tablet Take 1 tablet (10 mg total) by mouth 3 (three) times daily. Patient not taking: Reported on 07/03/2018 06/02/18   Bethann Berkshire, MD  predniSONE (DELTASONE) 20 MG tablet Take 3 po QD x 3d , then 2 po QD x 3d then 1 po QD x 3d Patient not taking: Reported on 07/03/2018 03/02/18   Devoria Albe, MD  Past Surgical History Past Surgical History:  Procedure Laterality Date  . BACK SURGERY    . MAXIMUM ACCESS (MAS)POSTERIOR LUMBAR INTERBODY FUSION (PLIF) 1 LEVEL  11/20/2013   Procedure: FOR MAXIMUM ACCESS (MAS) POSTERIOR LUMBAR INTERBODY FUSION LUMBAR FIVE-SACRAL ONE;  Surgeon: Tia Alert, MD;  Location: [facility];  Service: Neurosurgery;;  FOR MAXIMUM ACCESS (MAS) POSTERIOR LUMBAR INTERBODY FUSION LUMBAR FIVE-SACRAL ONE  . TONSILLECTOMY     Family History No family history on file.  Social History Social History   Tobacco Use  . Smoking status: Current  Every Day Smoker    Packs/day: 0.50    Years: 10.00    Pack years: 5.00    Types: Cigarettes  . Smokeless tobacco: Never Used  Substance Use Topics  . Alcohol use: Yes    Alcohol/week: 7.2 oz    Types: 12 Cans of beer per week    Comment: every 2 or 3 days  . Drug use: No   Allergies Aspirin; Ibuprofen; Toradol [ketorolac tromethamine]; and Tramadol  Review of Systems Review of Systems  Constitutional: Positive for diaphoresis and malaise/fatigue.  Respiratory: Positive for shortness of breath.   Cardiovascular: Positive for chest pain and near-syncope.  Gastrointestinal: Positive for nausea and vomiting.  Musculoskeletal: Positive for back pain.  Neurological: Positive for dizziness.   All other systems are reviewed and are negative for acute change except as noted in the HPI  Physical Exam Vital Signs  I have reviewed the triage vital signs BP 138/61   Pulse 90   Temp 99.5 F (37.5 C) (Oral)   Resp 17   SpO2 97%   Physical Exam  Constitutional: He is oriented to person, place, and time. He appears well-developed and well-nourished. No distress.  HENT:  Head: Normocephalic and atraumatic.  Nose: Nose normal.  Eyes: Pupils are equal, round, and reactive to light. Conjunctivae and EOM are normal. Right eye exhibits no discharge. Left eye exhibits no discharge. No scleral icterus.  Neck: Normal range of motion. Neck supple.  Cardiovascular: Normal rate and regular rhythm. Exam reveals no gallop and no friction rub.  No murmur heard. Pulmonary/Chest: Effort normal and breath sounds normal. No stridor. No respiratory distress. He has no rales.  Abdominal: Soft. He exhibits no distension. There is no tenderness. There is no rigidity, no rebound and no guarding.  Musculoskeletal: He exhibits no edema or tenderness.  Neurological: He is alert and oriented to person, place, and time.  Skin: Skin is warm and dry. No rash noted. He is not diaphoretic. No erythema.    Psychiatric: He has a normal mood and affect.  Vitals reviewed.   ED Results and Treatments Labs (all labs ordered are listed, but only abnormal results are displayed) Labs Reviewed  BASIC METABOLIC PANEL - Abnormal; Notable for the following components:      Result Value   CO2 20 (*)    Glucose, Bld 194 (*)    Calcium 8.5 (*)    All other components within normal limits  CBC - Abnormal; Notable for the following components:   WBC 11.8 (*)    All other components within normal limits  HEPATIC FUNCTION PANEL - Abnormal; Notable for the following components:   Total Protein 6.1 (*)    All other components within normal limits  LIPASE, BLOOD  I-STAT TROPONIN, ED  I-STAT TROPONIN, ED  EKG  EKG Interpretation  Date/Time:  Tuesday July 03 2018 01:05:54 EDT Ventricular Rate:  91 PR Interval:    QRS Duration: 95 QT Interval:  347 QTC Calculation: 427 R Axis:   27 Text Interpretation:  Sinus rhythm Artifact Otherwise no significant change Confirmed by Drema Pryardama, Pedro [phone]([id number]) on 07/03/2018 1:12:40 AM Also confirmed by Drema Pryardama, Pedro [phone]([id number]), editor Elita QuickWatlington, Beverly ([id number])  on 07/03/2018 6:45:00 AM      Radiology Dg Chest 2 View  Result Date: 07/03/2018 CLINICAL DATA:  Chest pain EXAM: CHEST - 2 VIEW COMPARISON:  Chest CT 02/26/2018 FINDINGS: Normal heart size and mediastinal contours. There is no edema, consolidation, effusion, or pneumothorax. Artifact from EKG leads. Spondylosis with multi-level ankylosis. IMPRESSION: No evidence of active disease. Electronically Signed   By: Marnee SpringJonathon  Watts M.D.   On: 07/03/2018 02:06   Ct Angio Chest/abd/pel For Dissection W And/or Wo Contrast  Result Date: 07/03/2018 CLINICAL DATA:  Chest and back pain.  Aortic dissection suspected EXAM: CT ANGIOGRAPHY CHEST, ABDOMEN AND PELVIS TECHNIQUE: Multidetector CT imaging through the  chest, abdomen and pelvis was performed using the standard protocol during bolus administration of intravenous contrast. Multiplanar reconstructed images and MIPs were obtained and reviewed to evaluate the vascular anatomy. CONTRAST:  100mL ISOVUE-370 IOPAMIDOL (ISOVUE-370) INJECTION 76% COMPARISON:  None. FINDINGS: CTA CHEST FINDINGS Cardiovascular: No intramural hematoma on noncontrast phase. Negative for aortic dissection or aneurysm. No noted atheromatous changes to the aorta. The opacified pulmonary arteries are clear no cardiomegaly or pericardial effusion. Mediastinum/Nodes: Negative for adenopathy. Lungs/Pleura: There is no edema, consolidation, effusion, or pneumothorax. Generalized airway thickening. Musculoskeletal: No acute or aggressive finding.  Spondylosis. Review of the MIP images confirms the above findings. CTA ABDOMEN AND PELVIS FINDINGS VASCULAR Aorta: Negative Celiac: Negative SMA: Negative Renals: Single right renal artery and duplicated left renal arteries that are unremarkable. IMA: Widely patent. Inflow: Negative. Veins: Negative noncontrast appearance Review of the MIP images confirms the above findings. NON-VASCULAR Hepatobiliary: No focal liver abnormality. Lobulated liver surface with prominent caudate size. No evidence of biliary obstruction or stone. Pancreas: Unremarkable. Spleen: Unremarkable. Adrenals/Urinary Tract: Negative adrenals. 9 mm left renal stone. Left renal cystic densities. Unremarkable bladder. Stomach/Bowel: No obstruction. No appendicitis. Mild sigmoid diverticulosis. Lymphatic: No mass or adenopathy. Reproductive:No pathologic findings. Other: No ascites or pneumoperitoneum. Musculoskeletal: No acute abnormalities. Spondylosis with multi-level ankylosis. 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 L5-S1 PLIF. Advanced L4-5 facet arthropathy and hypertrophy. L4-5 disc degeneration and high-grade spinal stenosis. Spinal canal is congenitally narrow. Review of the  MIP images confirms the above findings. IMPRESSION: 1. No acute finding.  Normal appearance of the aorta. 2. Left nephrolithiasis. 3. Possible cirrhosis, please correlate for risk factors. Electronically Signed   By: Marnee SpringJonathon  Watts M.D.   On: 07/03/2018 03:03   Pertinent labs & imaging results that were available during my care of the patient were reviewed by me and considered in my medical decision making (see chart for details).  Medications Ordered in ED Medications  sodium chloride 0.9 % bolus 1,000 mL (0 mLs Intravenous Stopped 07/03/18 0536)  fentaNYL (SUBLIMAZE) injection 50 mcg (50 mcg Intravenous Given 07/03/18 0154)  ondansetron (ZOFRAN) injection 4 mg (4 mg Intravenous Given 07/03/18 0153)  iopamidol (ISOVUE-370) 76 % injection 100 mL (100 mLs Intravenous Contrast Given 07/03/18 0204)  gi cocktail (Maalox,Lidocaine,Donnatal) (30 mLs Oral Given 07/03/18 0410)  Procedures Procedures  (including critical care time)  Medical Decision Making / ED Course I have reviewed the nursing notes for this encounter and the patient's prior records (if available in EHR or on provided paperwork).    Atypical chest pain.  EKG without acute ischemic changes or evidence of pericarditis.  Initial troponin negative.  Patient had recent stress test revealing low risk for CAD.  Will obtain delta troponin if rest of the work-up is negative.   Given patient's reported history of aortic aneurysm, CTA was obtained negative for pulmonary emboli or aortic process.  No aneurysms noted.  Delta troponin negative.  Patient provided with GI cocktail  Patient noted to be sleeping comfortably in bed on multiple reassessments.  When he is awakened, he complains of pain.  Patient reported drinking 3 beers earlier today.  No evidence of hepatitis or pancreatitis.  No biliary obstruction.  No  evidence of intra-abdominal inflammatory/infectious process on imaging.  The patient is safe for discharge with strict return precautions.   Final Clinical Impression(s) / ED Diagnoses Final diagnoses:  Atypical chest pain    Disposition: Discharge  Condition: Good  I have discussed the results, Dx and Tx plan with the patient who expressed understanding and agree(s) with the plan. Discharge instructions discussed at great length. The patient was given strict return precautions who verbalized understanding of the instructions. No further questions at time of discharge.    ED Discharge Orders    None       Follow Up: [facility] [address] [place] [address] [phone]  Schedule an appointment as soon as possible for a visit  As needed     This chart was dictated using voice recognition software.  Despite best efforts to proofread,  errors can occur which can change the documentation meaning.   Nira Conn, MD 07/03/18 [phone]

## 2018-07-03 NOTE — ED Triage Notes (Signed)
[facility], Pt has hx of AAA, pt had sudden onset of Sharp central CP radiating to L arm, L neck and between shoulder blades, 10/10. Pt took 324 ASA at home. Pt received 1 nitro en route and his BP dropped to 100/68. Pt actively vomiting upon arrival to room.

## 2018-07-03 NOTE — ED Notes (Signed)
Called lab adding on labs 

## 2018-07-22 ENCOUNTER — Other Ambulatory Visit: Payer: Self-pay

## 2018-07-22 ENCOUNTER — Encounter (HOSPITAL_COMMUNITY): Payer: Self-pay | Admitting: *Deleted

## 2018-07-22 ENCOUNTER — Emergency Department (HOSPITAL_COMMUNITY): Payer: Medicare Other

## 2018-07-22 ENCOUNTER — Emergency Department (HOSPITAL_COMMUNITY)
Admission: EM | Admit: 2018-07-22 | Discharge: 2018-07-22 | Disposition: A | Discharge status: Home / Self Care | Payer: Medicare Other | Attending: Emergency Medicine | Admitting: Emergency Medicine

## 2018-07-22 DIAGNOSIS — I10 Essential (primary) hypertension: Secondary | ICD-10-CM | POA: Diagnosis not present

## 2018-07-22 DIAGNOSIS — Z79899 Other long term (current) drug therapy: Secondary | ICD-10-CM | POA: Diagnosis not present

## 2018-07-22 DIAGNOSIS — R109 Unspecified abdominal pain: Secondary | ICD-10-CM | POA: Diagnosis present

## 2018-07-22 DIAGNOSIS — N201 Calculus of ureter: Secondary | ICD-10-CM | POA: Insufficient documentation

## 2018-07-22 DIAGNOSIS — Z7984 Long term (current) use of oral hypoglycemic drugs: Secondary | ICD-10-CM | POA: Insufficient documentation

## 2018-07-22 DIAGNOSIS — E119 Type 2 diabetes mellitus without complications: Secondary | ICD-10-CM | POA: Insufficient documentation

## 2018-07-22 DIAGNOSIS — G894 Chronic pain syndrome: Secondary | ICD-10-CM | POA: Diagnosis not present

## 2018-07-22 DIAGNOSIS — M5432 Sciatica, left side: Secondary | ICD-10-CM | POA: Diagnosis not present

## 2018-07-22 DIAGNOSIS — R3911 Hesitancy of micturition: Secondary | ICD-10-CM | POA: Insufficient documentation

## 2018-07-22 DIAGNOSIS — I2510 Atherosclerotic heart disease of native coronary artery without angina pectoris: Secondary | ICD-10-CM | POA: Diagnosis not present

## 2018-07-22 DIAGNOSIS — F17210 Nicotine dependence, cigarettes, uncomplicated: Secondary | ICD-10-CM | POA: Diagnosis not present

## 2018-07-22 LAB — URINALYSIS, ROUTINE W REFLEX MICROSCOPIC
BACTERIA UA: NONE SEEN
Bilirubin Urine: NEGATIVE
Glucose, UA: >=500 mg/dL — AB
KETONES UR: NEGATIVE mg/dL
LEUKOCYTES UA: NEGATIVE
Nitrite: NEGATIVE
PROTEIN: 100 mg/dL — AB
Specific Gravity, Urine: 1.023 (ref 1.005–1.030)
pH: 5 (ref 5.0–8.0)

## 2018-07-22 MED ORDER — CYCLOBENZAPRINE HCL 10 MG PO TABS: 10.0000 mg | ORAL_TABLET | Freq: Three times a day (TID) | ORAL | 0 refills | Status: DC

## 2018-07-22 MED ORDER — OXYCODONE-ACETAMINOPHEN 5-325 MG PO TABS
1.0000 | ORAL_TABLET | Freq: Once | ORAL | Status: AC
  Administered 2018-07-22: 1 via ORAL
  Filled 2018-07-22: qty 1

## 2018-07-22 MED ORDER — HYDROMORPHONE HCL 2 MG/ML IJ SOLN
2.0000 mg | Freq: Once | INTRAMUSCULAR | Status: AC
  Administered 2018-07-22: 2 mg via INTRAMUSCULAR
  Filled 2018-07-22: qty 1

## 2018-07-22 MED ORDER — TAMSULOSIN HCL 0.4 MG PO CAPS
0.4000 mg | ORAL_CAPSULE | Freq: Every day | ORAL | 0 refills | Status: DC
Start: 2018-07-22 — End: 2023-09-18

## 2018-07-22 MED ORDER — DEXAMETHASONE SODIUM PHOSPHATE 10 MG/ML IJ SOLN
10.0000 mg | Freq: Once | INTRAMUSCULAR | Status: AC
  Administered 2018-07-22: 10 mg via INTRAMUSCULAR
  Filled 2018-07-22: qty 1

## 2018-07-22 MED ORDER — TAMSULOSIN HCL 0.4 MG PO CAPS
0.4000 mg | ORAL_CAPSULE | Freq: Once | ORAL | Status: AC
  Administered 2018-07-22: 0.4 mg via ORAL
  Filled 2018-07-22: qty 1

## 2018-07-22 MED ORDER — ONDANSETRON 8 MG PO TBDP
8.0000 mg | ORAL_TABLET | Freq: Once | ORAL | Status: AC
  Administered 2018-07-22: 8 mg via ORAL
  Filled 2018-07-22: qty 1

## 2018-07-22 NOTE — ED Provider Notes (Signed)
[facility] EMERGENCY DEPARTMENT Provider Note   CSN: [id number] Arrival date & time: 07/22/18  0114     History   Chief Complaint Chief Complaint  Patient presents with  . Flank Pain    HPI Thomas Donovan is a 57 y.o. male.  The history is provided by the patient.  He has history of chronic back pain, diabetes, hypertension, kidney stones and comes in complaining of left flank pain radiating to the left groin and left leg.  Pain started about 3 days ago and has been intermittent, but getting worse.  He currently rates pain a 10/10.  There is been nausea but no vomiting.  There has been some urinary hesitancy.  He denies fever or chills.  He tried taking ibuprofen without any relief.  He states that he cannot find a position where he is comfortable.  Past Medical History:  Diagnosis Date  . Aortic aneurysm (HCC)   . Chronic back pain   . Cold    recent rx  . Coronary artery disease   . Degeneration of lumbar intervertebral disc   . Diabetes mellitus   . GERD (gastroesophageal reflux disease)   . Gout   . History of kidney stones   . Hypertension   . Pneumonia     Patient Active Problem List   Diagnosis Date Noted  . Tobacco use 08/22/2017  . Hypokalemia 08/22/2017  . GERD (gastroesophageal reflux disease) 08/22/2017  . Type 2 diabetes mellitus with other specified complication (HCC) 08/22/2017  . Syncope 08/21/2017  . Chest pain 08/21/2017  . Dyspnea 08/21/2017  . Hematochezia 08/21/2017  . 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 BACK SURGERY    . MAXIMUM ACCESS (MAS)POSTERIOR LUMBAR INTERBODY FUSION (PLIF) 1 LEVEL  11/20/2013   Procedure: FOR MAXIMUM ACCESS (MAS) POSTERIOR LUMBAR INTERBODY FUSION LUMBAR FIVE-SACRAL ONE;  Surgeon: Tia Alert, MD;  Location: [facility];  Service: Neurosurgery;;  FOR MAXIMUM ACCESS (MAS) POSTERIOR LUMBAR INTERBODY FUSION LUMBAR FIVE-SACRAL ONE  . TONSILLECTOMY           Home Medications    Prior to Admission medications   Medication Sig Start Date End Date Taking? Authorizing Provider  gabapentin (NEURONTIN) 800 MG tablet Take 800 mg by mouth 4 (four) times daily. 02/23/18  Yes [provider]  metFORMIN (GLUCOPHAGE-XR) 500 MG 24 hr tablet Take 1-2 tablets (500-1,000 mg total) by mouth 2 (two) times daily. Pt takes two tablets in the morning and one at bedtime. Patient taking differently: Take 500 mg by mouth 2 (two) times daily. Pt takes two tablets in the morning and one at bedtime. 08/23/17  Yes Standley Brooking, MD  omeprazole (PRILOSEC) 40 MG capsule Take 40 mg by mouth daily.  11/25/17  Yes [provider]  oxyCODONE-acetaminophen (PERCOCET) 7.5-325 MG tablet Take 1 tablet by mouth 3 (three) times daily.   Yes [provider]  colchicine 0.6 MG tablet Take 1-2 tablets (0.6-1.2 mg total) by mouth 2 (two) times daily as needed (for gout flares). 08/23/17   Standley Brooking, MD  cyclobenzaprine (FLEXERIL) 10 MG tablet Take 1 tablet (10 mg total) by mouth 3 (three) times daily. Patient not taking: Reported on 07/03/2018 06/02/18   Bethann Berkshire, MD  predniSONE (DELTASONE) 20 MG tablet Take 3 po QD x 3d , then 2 po QD x 3d then 1 po QD x 3d 03/02/18   Devoria Albe, MD  Family History No family history on file.  Social History Social History   Tobacco Use  . Smoking status: Current Every Day Smoker    Packs/day: 0.50    Years: 10.00    Pack years: 5.00    Types: Cigarettes  . Smokeless tobacco: Never Used  Substance Use Topics  . Alcohol use: Yes    Alcohol/week: 7.2 oz    Types: 12 Cans of beer per week    Comment: every 2 or 3 days  . Drug use: No     Allergies   Aspirin; Ibuprofen; Toradol [ketorolac tromethamine]; and Tramadol   Review of Systems Review of Systems  All other systems reviewed and are negative.    Physical Exam Updated Vital Signs BP (!) 191/113   Pulse 75   Temp 97.6 F (36.4  C) (Oral)   Resp (!) 21   Ht 6' (1.829 m)   Wt 129.3 kg (285 lb)   SpO2 100%   BMI 38.65 kg/m   Physical Exam  Nursing note and vitals reviewed.  57 year old male, resting comfortably and in no acute distress. Vital signs are significant for elevated blood pressure and borderline elevated respiratory rate. Oxygen saturation is 100%, which is normal. Head is normocephalic and atraumatic. PERRLA, EOMI. Oropharynx is clear. Neck is nontender and supple without adenopathy or JVD. Back has mild tenderness in the mid lumbar area.  There is no CVA tenderness.  Straight leg raise is positive on the left at 30 degrees. Lungs are clear without rales, wheezes, or rhonchi. Chest is nontender. Heart has regular rate and rhythm without murmur. Abdomen is soft, flat, nontender without masses or hepatosplenomegaly and peristalsis is normoactive. Extremities have no cyanosis or edema, full range of motion is present. Skin is warm and dry without rash. Neurologic: Mental status is normal, cranial nerves are intact, there are no motor or sensory deficits.  ED Treatments / Results  Labs (all labs ordered are listed, but only abnormal results are displayed) Labs Reviewed  URINALYSIS, ROUTINE W REFLEX MICROSCOPIC - Abnormal; Notable for the following components:      Result Value   APPearance HAZY (*)    Glucose, UA >=500 (*)    Hgb urine dipstick LARGE (*)    Protein, ur 100 (*)    RBC / HPF >50 (*)    All other components within normal limits   Radiology Ct Renal Stone Study  Result Date: 07/22/2018 CLINICAL DATA:  Left-sided flank pain EXAM: CT ABDOMEN AND PELVIS WITHOUT CONTRAST TECHNIQUE: Multidetector CT imaging of the abdomen and pelvis was performed following the standard protocol without IV contrast. COMPARISON:  CT 07/05/2018, 06/02/2018, 03/02/2018. FINDINGS: Lower chest: Lung bases demonstrate no acute consolidation or effusion. Heart size within normal limits. Hepatobiliary: Contracted  gallbladder. No calcified stones. No biliary dilatation. No focal hepatic abnormality. Pancreas: Unremarkable. No pancreatic ductal dilatation or surrounding inflammatory changes. Spleen: Normal in size without focal abnormality. Adrenals/Urinary Tract: Adrenal glands are normal. No significant hydronephrosis. Cyst in the mid to lower left kidney. Multiple stones in the left kidney, largest measures up to 1 cm in the lower pole. Punctate 3 mm stone in the proximal left ureter about 2.5 cm distal to the left UPJ. The bladder is unremarkable Stomach/Bowel: Stomach is within normal limits. Appendix appears normal. No evidence of bowel wall thickening, distention, or inflammatory changes. Sigmoid colon diverticular disease without acute inflammation Vascular/Lymphatic: Nonaneurysmal aorta. No significantly enlarged lymph nodes Reproductive: Prostate is unremarkable. Other: Negative for  free air or free fluid. Fat within the inguinal canal Musculoskeletal: Posterior stabilization rod with fixating screws and interbody device at L5-S1 IMPRESSION: 1. Punctate 3 mm stone proximal left ureter about 2.5 cm distal to the left UPJ. No significant hydronephrosis 2. Intrarenal calculi on the left 3. Sigmoid colon diverticular disease without acute inflammatory process Electronically Signed   By: Jasmine Pang M.D.   On: 07/22/2018 02:45    Procedures Procedures   Medications Ordered in ED Medications - No data to display   Initial Impression / Assessment and Plan / ED Course  I have reviewed the triage vital signs and the nursing notes.  Pertinent labs & imaging results that were available during my care of the patient were reviewed by me and considered in my medical decision making (see chart for details).  Left flank pain which seems more consistent with sciatica than renal colic.  Urinalysis does show hematuria, and the past urinalyses have shown hematuria.  He has had 3 CT scans of abdomen and pelvis in the  last year all of which show bilateral nephrolithiasis, but he has not had any ureterolithiasis.  He has had several ED visits for flank pain.  Also, of note, his problem list states aortic aneurysm, but CT angiograms of chest in 2019, and abdomen and pelvis in 2017 do not show any evidence of aortic aneurysm.  Since ultrasound is not available at this time, we will repeat renal stone protocol CT scan to make sure that he does not have ureterolithiasis.  He will be given oxycodone-acetaminophen for pain.  His record on the [place] controlled substance reporting website was reviewed, and he gets monthly prescriptions for oxycodone-acetaminophen 90 tablets with last prescription filled July 5.  He got little relief with oxycodone-acetaminophen, and is given a second dose.  He still complaining of considerable pain.  CT scan does show a 2 mm proximal left ureteral calculus without hydronephrosis.  It is likely that his pain radiating to the groin area is from the ureteral calculus, and pain going down his leg is from sciatica.  He is requesting a steroid injection and an pain injection.  He is given an injection of dexamethasone and hydromorphone.  He is discharged with prescription for tamsulosin, advised to continue taking his narcotic pain medication as prescribed.  Follow-up with his PCP and with urology.  Return precautions discussed.  Final Clinical Impressions(s) / ED Diagnoses   Final diagnoses:  Ureterolithiasis  Sciatica, left side  Chronic pain syndrome    ED Discharge Orders        Ordered    cyclobenzaprine (FLEXERIL) 10 MG tablet  3 times daily     07/22/18 0431    tamsulosin (FLOMAX) 0.4 MG CAPS capsule  Daily     07/22/18 0431       Dione Booze, MD 07/22/18 [phone]

## 2018-07-22 NOTE — Discharge Instructions (Signed)
Continue taking your pain medication as prescribed.  Return if you start running a fever.

## 2018-07-22 NOTE — ED Triage Notes (Addendum)
Pt c/o left lower abd pain, left flank pain that started tonight, pt reports dark urine with odor noted along with nausea. Pain radiates to left leg, worse with movement,

## 2018-07-24 ENCOUNTER — Emergency Department (HOSPITAL_COMMUNITY): Payer: Medicare Other

## 2018-07-24 ENCOUNTER — Emergency Department (HOSPITAL_COMMUNITY)
Admission: EM | Admit: 2018-07-24 | Discharge: 2018-07-24 | Disposition: A | Discharge status: Home / Self Care | Payer: Medicare Other | Attending: Emergency Medicine | Admitting: Emergency Medicine

## 2018-07-24 ENCOUNTER — Encounter (HOSPITAL_COMMUNITY): Payer: Self-pay | Admitting: Emergency Medicine

## 2018-07-24 ENCOUNTER — Other Ambulatory Visit: Payer: Self-pay

## 2018-07-24 DIAGNOSIS — F17210 Nicotine dependence, cigarettes, uncomplicated: Secondary | ICD-10-CM | POA: Insufficient documentation

## 2018-07-24 DIAGNOSIS — Z7984 Long term (current) use of oral hypoglycemic drugs: Secondary | ICD-10-CM | POA: Diagnosis not present

## 2018-07-24 DIAGNOSIS — Z79899 Other long term (current) drug therapy: Secondary | ICD-10-CM | POA: Insufficient documentation

## 2018-07-24 DIAGNOSIS — M5440 Lumbago with sciatica, unspecified side: Secondary | ICD-10-CM | POA: Diagnosis not present

## 2018-07-24 DIAGNOSIS — I2510 Atherosclerotic heart disease of native coronary artery without angina pectoris: Secondary | ICD-10-CM | POA: Insufficient documentation

## 2018-07-24 DIAGNOSIS — I10 Essential (primary) hypertension: Secondary | ICD-10-CM | POA: Insufficient documentation

## 2018-07-24 DIAGNOSIS — R1032 Left lower quadrant pain: Secondary | ICD-10-CM | POA: Diagnosis present

## 2018-07-24 DIAGNOSIS — E119 Type 2 diabetes mellitus without complications: Secondary | ICD-10-CM | POA: Diagnosis not present

## 2018-07-24 LAB — CBC WITH DIFFERENTIAL/PLATELET
BASOS ABS: 0.1 10*3/uL (ref 0.0–0.1)
BASOS PCT: 1 %
EOS ABS: 0.1 10*3/uL (ref 0.0–0.7)
Eosinophils Relative: 1 %
HCT: 44.7 % (ref 39.0–52.0)
HEMOGLOBIN: 15.9 g/dL (ref 13.0–17.0)
LYMPHS ABS: 2.7 10*3/uL (ref 0.7–4.0)
Lymphocytes Relative: 25 %
MCH: 32.5 pg (ref 26.0–34.0)
MCHC: 35.6 g/dL (ref 30.0–36.0)
MCV: 91.4 fL (ref 78.0–100.0)
Monocytes Absolute: 0.6 10*3/uL (ref 0.1–1.0)
Monocytes Relative: 5 %
NEUTROS PCT: 68 %
Neutro Abs: 7.4 10*3/uL (ref 1.7–7.7)
Platelets: 277 10*3/uL (ref 150–400)
RBC: 4.89 MIL/uL (ref 4.22–5.81)
RDW: 13 % (ref 11.5–15.5)
WBC: 10.9 10*3/uL — AB (ref 4.0–10.5)

## 2018-07-24 LAB — URINALYSIS, ROUTINE W REFLEX MICROSCOPIC
BACTERIA UA: NONE SEEN
BILIRUBIN URINE: NEGATIVE
Glucose, UA: 50 mg/dL — AB
KETONES UR: 5 mg/dL — AB
Leukocytes, UA: NEGATIVE
Nitrite: NEGATIVE
PROTEIN: 100 mg/dL — AB
SPECIFIC GRAVITY, URINE: 1.031 — AB (ref 1.005–1.030)
pH: 5 (ref 5.0–8.0)

## 2018-07-24 LAB — BASIC METABOLIC PANEL
ANION GAP: 11 (ref 5–15)
BUN: 16 mg/dL (ref 6–20)
CHLORIDE: 106 mmol/L (ref 98–111)
CO2: 23 mmol/L (ref 22–32)
Calcium: 9.4 mg/dL (ref 8.9–10.3)
Creatinine, Ser: 1.14 mg/dL (ref 0.61–1.24)
Glucose, Bld: 227 mg/dL — ABNORMAL HIGH (ref 70–99)
POTASSIUM: 3.9 mmol/L (ref 3.5–5.1)
SODIUM: 140 mmol/L (ref 135–145)

## 2018-07-24 MED ORDER — HYDROMORPHONE HCL 1 MG/ML IJ SOLN
1.0000 mg | Freq: Once | INTRAMUSCULAR | Status: AC
  Administered 2018-07-24: 1 mg via INTRAVENOUS
  Filled 2018-07-24: qty 1

## 2018-07-24 MED ORDER — HYDROMORPHONE HCL 4 MG PO TABS: 4.0000 mg | ORAL_TABLET | ORAL | 0 refills | Status: DC | PRN

## 2018-07-24 MED ORDER — METHYLPREDNISOLONE SODIUM SUCC 125 MG IJ SOLR
125.0000 mg | Freq: Once | INTRAMUSCULAR | Status: AC
  Administered 2018-07-24: 125 mg via INTRAVENOUS
  Filled 2018-07-24: qty 2

## 2018-07-24 MED ORDER — HYDROMORPHONE HCL 1 MG/ML IJ SOLN
0.5000 mg | Freq: Once | INTRAMUSCULAR | Status: AC
  Administered 2018-07-24: 0.5 mg via INTRAVENOUS
  Filled 2018-07-24: qty 1

## 2018-07-24 MED ORDER — ONDANSETRON HCL 4 MG/2ML IJ SOLN
4.0000 mg | Freq: Once | INTRAMUSCULAR | Status: AC
  Administered 2018-07-24: 4 mg via INTRAVENOUS
  Filled 2018-07-24: qty 2

## 2018-07-24 NOTE — ED Notes (Signed)
Pt stated pain is coming back 9/10

## 2018-07-24 NOTE — ED Notes (Signed)
Pt back from x-ray.

## 2018-07-24 NOTE — ED Provider Notes (Signed)
[facility] EMERGENCY DEPARTMENT Provider Note   CSN: [id number] Arrival date & time: 07/24/18  1339     History   Chief Complaint Chief Complaint  Patient presents with  . Flank Pain    HPI Thomas Donovan is a 57 y.o. male.  Patient is a kidney stone but he recently fell when on his back and now he is having pain in his lower back radiating down his left leg.  Patient has a history of surgery to his lower back before  The history is provided by the patient. No language interpreter was used.  Flank Pain  This is a new problem. The current episode started 12 to 24 hours ago. The problem occurs constantly. The problem has not changed since onset.Pertinent negatives include no chest pain, no abdominal pain and no headaches. Exacerbated by: Movement. Nothing relieves the symptoms. He has tried nothing for the symptoms. The treatment provided no relief.    Past Medical History:  Diagnosis Date  . Aortic aneurysm (HCC)   . Chronic back pain   . Cold    recent rx  . Coronary artery disease   . Degeneration of lumbar intervertebral disc   . Diabetes mellitus   . GERD (gastroesophageal reflux disease)   . Gout   . History of kidney stones   . Hypertension   . Pneumonia     Patient Active Problem List   Diagnosis Date Noted  . Tobacco use 08/22/2017  . Hypokalemia 08/22/2017  . GERD (gastroesophageal reflux disease) 08/22/2017  . Type 2 diabetes mellitus with other specified complication (HCC) 08/22/2017  . Syncope 08/21/2017  . Chest pain 08/21/2017  . Dyspnea 08/21/2017  . Hematochezia 08/21/2017  . 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 BACK SURGERY    . MAXIMUM ACCESS (MAS)POSTERIOR LUMBAR INTERBODY FUSION (PLIF) 1 LEVEL  11/20/2013   Procedure: FOR MAXIMUM ACCESS (MAS) POSTERIOR LUMBAR INTERBODY FUSION LUMBAR FIVE-SACRAL ONE;  Surgeon: Tia Alert, MD;  Location: [facility];  Service: Neurosurgery;;  FOR  MAXIMUM ACCESS (MAS) POSTERIOR LUMBAR INTERBODY FUSION LUMBAR FIVE-SACRAL ONE  . TONSILLECTOMY          Home Medications    Prior to Admission medications   Medication Sig Start Date End Date Taking? Authorizing Provider  colchicine 0.6 MG tablet Take 1-2 tablets (0.6-1.2 mg total) by mouth 2 (two) times daily as needed (for gout flares). 08/23/17  Yes Standley Brooking, MD  cyclobenzaprine (FLEXERIL) 10 MG tablet Take 1 tablet (10 mg total) by mouth 3 (three) times daily. 07/22/18  Yes Dione Booze, MD  gabapentin (NEURONTIN) 800 MG tablet Take 800 mg by mouth 4 (four) times daily. 02/23/18  Yes [provider]  losartan (COZAAR) 50 MG tablet Take 50 mg by mouth daily.   Yes [provider]  metFORMIN (GLUCOPHAGE-XR) 500 MG 24 hr tablet Take 1-2 tablets (500-1,000 mg total) by mouth 2 (two) times daily. Pt takes two tablets in the morning and one at bedtime. Patient taking differently: Take 500 mg by mouth 2 (two) times daily. Pt takes two tablets in the morning and one at bedtime. 08/23/17  Yes Standley Brooking, MD  omeprazole (PRILOSEC) 40 MG capsule Take 40 mg by mouth daily.  11/25/17  Yes [provider]  oxyCODONE-acetaminophen (PERCOCET) 7.5-325 MG tablet Take 1 tablet by mouth 3 (three) times daily.   Yes [provider]  tamsulosin (FLOMAX) 0.4 MG CAPS  capsule Take 1 capsule (0.4 mg total) by mouth daily. 07/22/18  Yes Dione BoozeGlick, David, MD  HYDROmorphone (DILAUDID) 4 MG tablet Take 1 tablet (4 mg total) by mouth every 4 (four) hours as needed for severe pain. 07/24/18   Bethann BerkshireZammit, Shina Wass, MD    Family History History reviewed. No pertinent family history.  Social History Social History   Tobacco Use  . Smoking status: Current Every Day Smoker    Packs/day: 0.50    Years: 10.00    Pack years: 5.00    Types: Cigarettes  . Smokeless tobacco: Never Used  Substance Use Topics  . Alcohol use: Yes    Alcohol/week: 7.2 oz    Types: 12 Cans of beer per  week    Comment: every 2 or 3 days  . Drug use: No     Allergies   Aspirin; Ibuprofen; Toradol [ketorolac tromethamine]; and Tramadol   Review of Systems Review of Systems  Constitutional: Negative for appetite change and fatigue.  HENT: Negative for congestion, ear discharge and sinus pressure.   Eyes: Negative for discharge.  Respiratory: Negative for cough.   Cardiovascular: Negative for chest pain.  Gastrointestinal: Negative for abdominal pain and diarrhea.  Genitourinary: Positive for flank pain. Negative for frequency and hematuria.  Musculoskeletal: Negative for back pain.  Skin: Negative for rash.  Neurological: Negative for seizures and headaches.  Psychiatric/Behavioral: Negative for hallucinations.     Physical Exam Updated Vital Signs BP (!) 153/90   Pulse 78   Temp 98.3 F (36.8 C) (Oral)   Resp (!) 21   Ht 6' (1.829 m)   Wt 129.3 kg (285 lb)   SpO2 99%   BMI 38.65 kg/m   Physical Exam  Constitutional: He is oriented to person, place, and time. He appears well-developed.  HENT:  Head: Normocephalic.  Eyes: Conjunctivae and EOM are normal. No scleral icterus.  Neck: Neck supple. No thyromegaly present.  Cardiovascular: Normal rate and regular rhythm. Exam reveals no gallop and no friction rub.  No murmur heard. Pulmonary/Chest: No stridor. He has no wheezes. He has no rales. He exhibits no tenderness.  Abdominal: He exhibits no distension. There is no tenderness. There is no rebound.  Genitourinary:  Genitourinary Comments: Lumbar spine  Musculoskeletal: Normal range of motion. He exhibits no edema.  Other straight leg raise on the left  Lymphadenopathy:    He has no cervical adenopathy.  Neurological: He is oriented to person, place, and time. He exhibits normal muscle tone. Coordination normal.  Skin: No rash noted. No erythema.  Psychiatric: He has a normal mood and affect. His behavior is normal.     ED Treatments / Results  Labs (all  labs ordered are listed, but only abnormal results are displayed) Labs Reviewed  URINALYSIS, ROUTINE W REFLEX MICROSCOPIC - Abnormal; Notable for the following components:      Result Value   Color, Urine AMBER (*)    APPearance HAZY (*)    Specific Gravity, Urine 1.031 (*)    Glucose, UA 50 (*)    Hgb urine dipstick LARGE (*)    Ketones, ur 5 (*)    Protein, ur 100 (*)    RBC / HPF >50 (*)    All other components within normal limits  CBC WITH DIFFERENTIAL/PLATELET - Abnormal; Notable for the following components:   WBC 10.9 (*)    All other components within normal limits  BASIC METABOLIC PANEL - Abnormal; Notable for the following components:   Glucose, Bld  227 (*)    All other components within normal limits    EKG None  Radiology Dg Lumbar Spine Complete  Result Date: 07/24/2018 CLINICAL DATA:  Three weeks of left flank pain with known history of kidney stones. The patient ports falling yesterday with onset of back pain. History of previous back surgery. EXAM: LUMBAR SPINE - COMPLETE 4+ VIEW COMPARISON:  Coronal and sagittal reconstructed images through the lumbar spine from an abdominal and pelvic CT scan dated July 22, 2018 FINDINGS: The lumbar vertebral bodies are preserved in height. The patient has undergone posterior fusion and intradiscal device placement at L5-S1. There is no spondylolisthesis. There is mild disc space narrowing at L4-5. There are anterior near bridging osteophytes at L2-3 and L3-4. The pedicles and transverse processes are intact where visualized. The metallic fusion hardware is intact. IMPRESSION: Previous PLIF at L5-S1 with no acute abnormality observed here. Mild degenerative disc space narrowing at L4-5. No compression fracture or spondylolisthesis. Electronically Signed   By: David  [place] M.D.   On: 07/24/2018 16:50    Procedures Procedures (including critical care time)  Medications Ordered in ED Medications  methylPREDNISolone sodium succinate  (SOLU-MEDROL) 125 mg/2 mL injection 125 mg (has no administration in time range)  HYDROmorphone (DILAUDID) injection 0.5 mg (has no administration in time range)  HYDROmorphone (DILAUDID) injection 1 mg (1 mg Intravenous Given 07/24/18 1612)  ondansetron (ZOFRAN) injection 4 mg (4 mg Intravenous Given 07/24/18 1609)  HYDROmorphone (DILAUDID) injection 1 mg (1 mg Intravenous Given 07/24/18 1856)     Initial Impression / Assessment and Plan / ED Course  I have reviewed the triage vital signs and the nursing notes.  Pertinent labs & imaging results that were available during my care of the patient were reviewed by me and considered in my medical decision making (see chart for details).     Patient with a small kidney stone that he is in a follow-up with urology.  His meds today he was having lower back pain from a fall.  Patient with contusion to lumbar spine with sciatica.  He will be given some Dilaudid to help with that pain and will continue taking his Flexeril will follow-up with his family doctor and urology  Final Clinical Impressions(s) / ED Diagnoses   Final diagnoses:  Acute right-sided low back pain with sciatica, sciatica laterality unspecified    ED Discharge Orders        Ordered    HYDROmorphone (DILAUDID) 4 MG tablet  Every 4 hours PRN     07/24/18 2018       Bethann Berkshire, MD 07/24/18 2020

## 2018-07-24 NOTE — Discharge Instructions (Signed)
Follow-up with [facility] for your kidney stone next week and follow-up with your family doctor for your back pain from your fall

## 2018-07-24 NOTE — ED Triage Notes (Signed)
Patient complaining of left flank pain x 3 weeks. States "I have a kidney stone I can't get rid of."

## 2018-07-24 NOTE — ED Notes (Signed)
Pt in wheelchair back to room 2, in a gown, labs completed. States he has fallen 2 times, back pain radiating down left leg. 10/10

## 2018-08-01 ENCOUNTER — Encounter (HOSPITAL_COMMUNITY): Payer: Self-pay | Admitting: Emergency Medicine

## 2018-08-01 ENCOUNTER — Emergency Department (HOSPITAL_COMMUNITY)
Admission: EM | Admit: 2018-08-01 | Discharge: 2018-08-01 | Disposition: A | Discharge status: Home / Self Care | Payer: Medicare Other | Attending: Emergency Medicine | Admitting: Emergency Medicine

## 2018-08-01 ENCOUNTER — Other Ambulatory Visit: Payer: Self-pay

## 2018-08-01 ENCOUNTER — Emergency Department (HOSPITAL_COMMUNITY): Payer: Medicare Other

## 2018-08-01 DIAGNOSIS — F17210 Nicotine dependence, cigarettes, uncomplicated: Secondary | ICD-10-CM | POA: Insufficient documentation

## 2018-08-01 DIAGNOSIS — I10 Essential (primary) hypertension: Secondary | ICD-10-CM | POA: Diagnosis not present

## 2018-08-01 DIAGNOSIS — I2510 Atherosclerotic heart disease of native coronary artery without angina pectoris: Secondary | ICD-10-CM | POA: Diagnosis not present

## 2018-08-01 DIAGNOSIS — W19XXXA Unspecified fall, initial encounter: Secondary | ICD-10-CM

## 2018-08-01 DIAGNOSIS — Z79899 Other long term (current) drug therapy: Secondary | ICD-10-CM | POA: Insufficient documentation

## 2018-08-01 DIAGNOSIS — Z7984 Long term (current) use of oral hypoglycemic drugs: Secondary | ICD-10-CM | POA: Insufficient documentation

## 2018-08-01 DIAGNOSIS — E119 Type 2 diabetes mellitus without complications: Secondary | ICD-10-CM | POA: Insufficient documentation

## 2018-08-01 DIAGNOSIS — M5442 Lumbago with sciatica, left side: Secondary | ICD-10-CM | POA: Insufficient documentation

## 2018-08-01 DIAGNOSIS — M545 Low back pain: Secondary | ICD-10-CM | POA: Diagnosis present

## 2018-08-01 LAB — URINALYSIS, ROUTINE W REFLEX MICROSCOPIC
Bacteria, UA: NONE SEEN
Bilirubin Urine: NEGATIVE
GLUCOSE, UA: 50 mg/dL — AB
Ketones, ur: NEGATIVE mg/dL
Leukocytes, UA: NEGATIVE
Nitrite: NEGATIVE
PH: 8 (ref 5.0–8.0)
Protein, ur: >=300 mg/dL — AB
RBC / HPF: >50 RBC/hpf — ABNORMAL HIGH (ref 0–5)
Specific Gravity, Urine: 1.016 (ref 1.005–1.030)

## 2018-08-01 MED ORDER — DEXAMETHASONE SODIUM PHOSPHATE 4 MG/ML IJ SOLN
10.0000 mg | Freq: Once | INTRAMUSCULAR | Status: AC
  Administered 2018-08-01: 10 mg via INTRAMUSCULAR
  Filled 2018-08-01: qty 3

## 2018-08-01 MED ORDER — PREDNISONE 10 MG PO TABS: ORAL_TABLET | ORAL | 0 refills | Status: DC

## 2018-08-01 MED ORDER — OXYCODONE-ACETAMINOPHEN 5-325 MG PO TABS
2.0000 | ORAL_TABLET | Freq: Once | ORAL | Status: AC
  Administered 2018-08-01: 2 via ORAL
  Filled 2018-08-01: qty 2

## 2018-08-01 NOTE — ED Notes (Signed)
Patient states he fell in the shower this am. C/O lower back pain with radiation down his L leg. Patient is on a pain contract. States the medication he is taking is not strong enough.

## 2018-08-01 NOTE — Discharge Instructions (Addendum)
Apply ice packs on/off to your back.  Call your pain management provider to arrange a follow-up appt.
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
## 2019-01-16 IMAGING — DX DG LUMBAR SPINE COMPLETE 4+V
5 series · 5 of 5 positions shown · non-contrast
Comparison: Seven hundred thirty-nine

CLINICAL DATA: Back pain.  Fall

EXAM:
LUMBAR SPINE - COMPLETE 4+ VIEW

[l-spine ap]
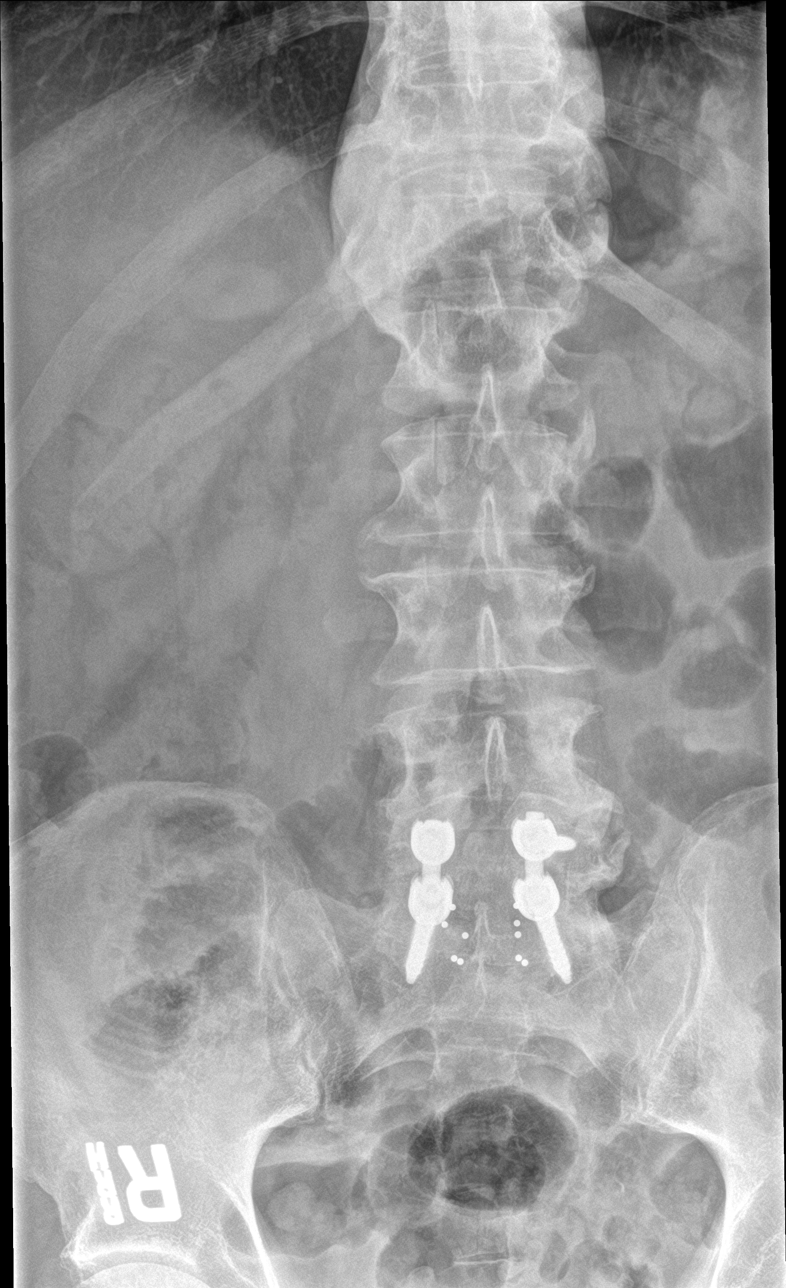

[l-spine obl (1 of 2)]
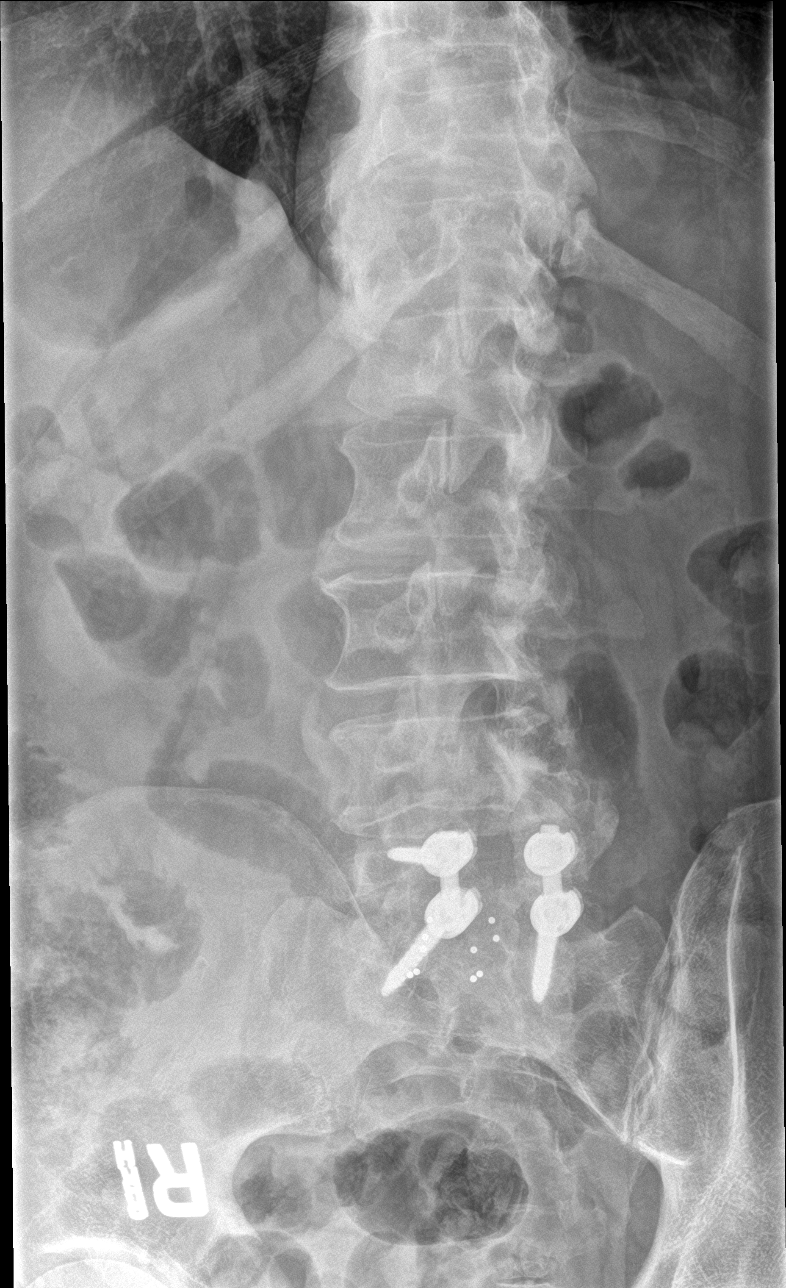

[l-spine obl (2 of 2)]
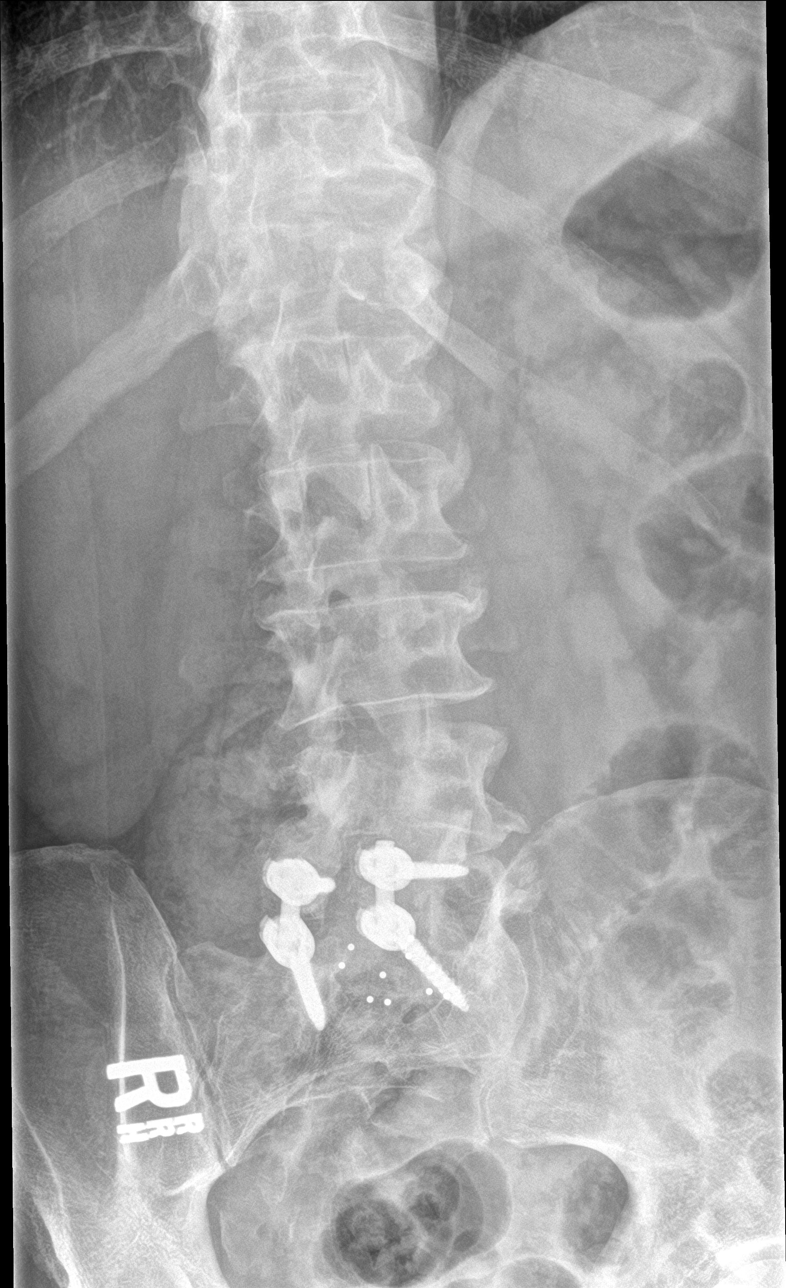

[l-spine lat]
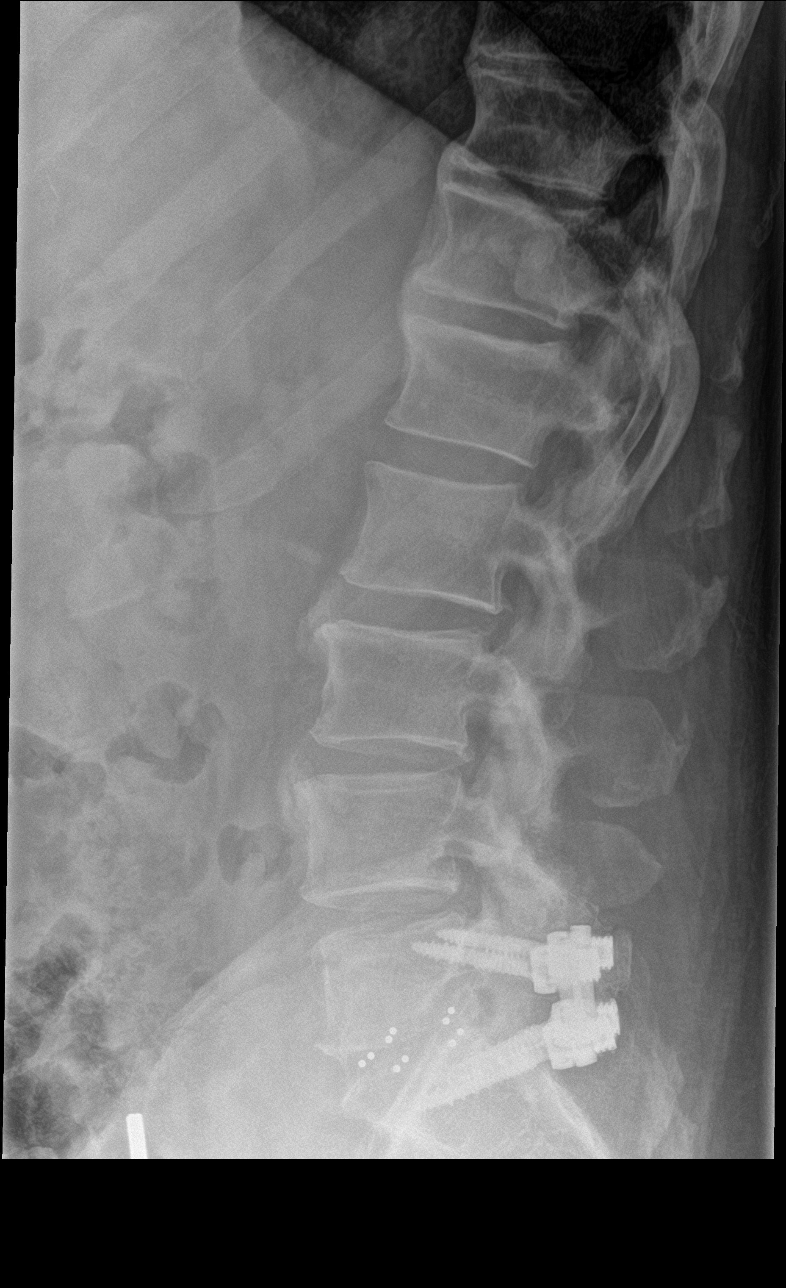

[l-spine spot]
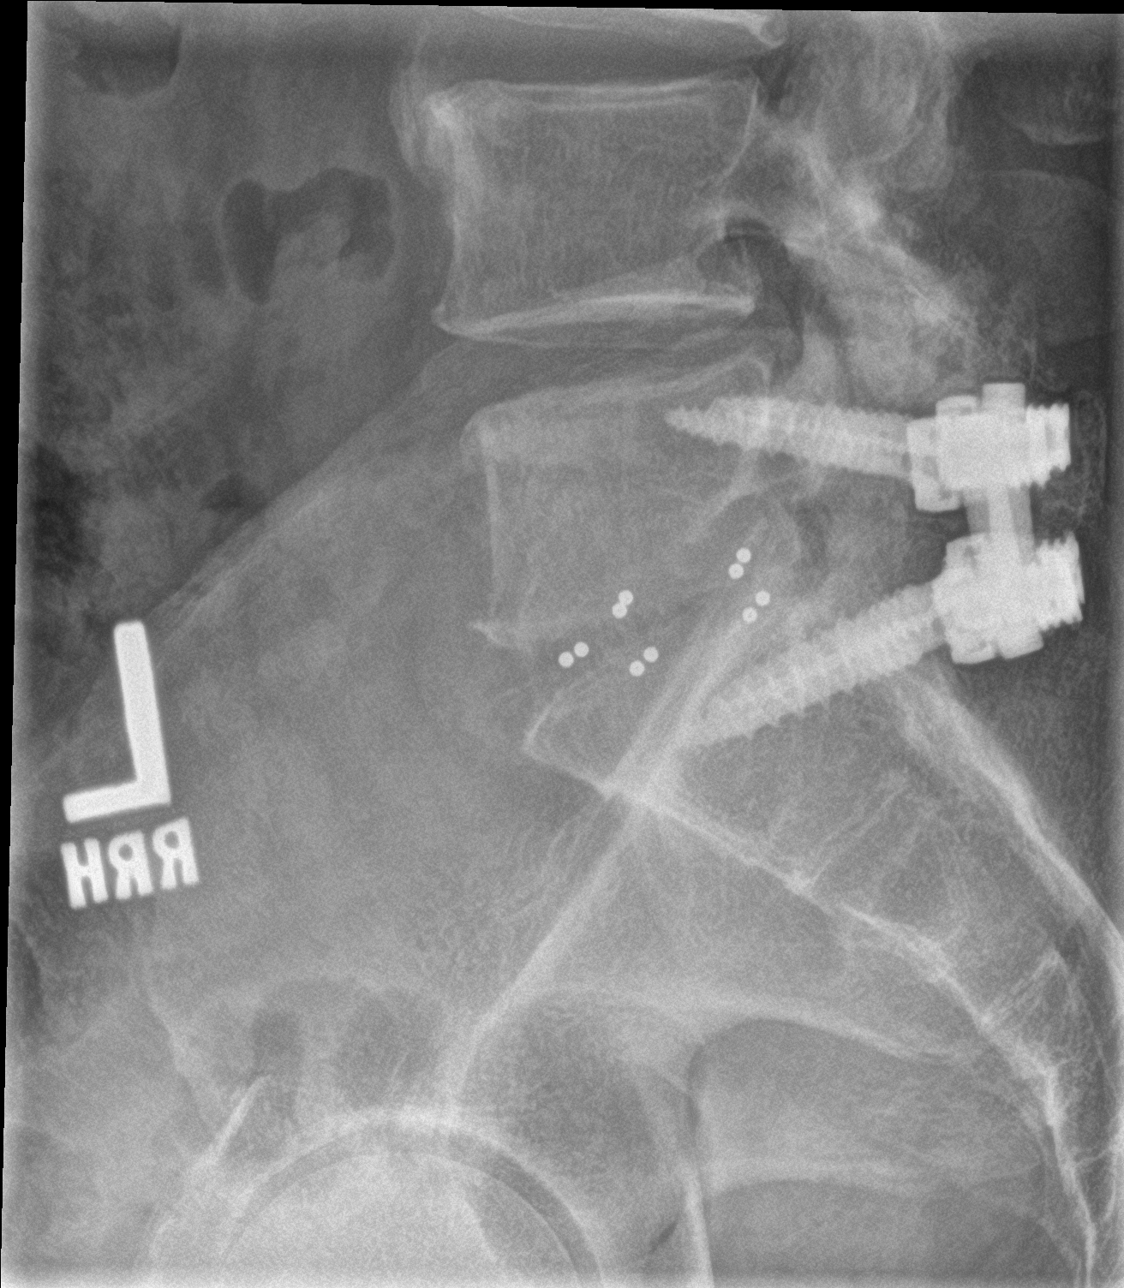

[5 of 5 positions shown; findings below may reference images not displayed]

FINDINGS: Posterior lumbar fusion at L5-S1. Fusion intact. Multiple levels of
osteophytosis. No acute loss of vertebral body height or disc
height. No subluxation.
IMPRESSION: No acute findings of the lumbar spine.

## 2019-03-18 ENCOUNTER — Telehealth: Payer: Self-pay | Admitting: Emergency Medicine

## 2019-03-18 NOTE — Telephone Encounter (Signed)
Called and spoke with mrs Stogner. Notified her that we are unable to see th patient at this time due cones restrictions from corona virus out break. She stated she understood and it was fine to go ahead and cancel his appointment and they will schedule it for a later date once it passes

## 2019-03-19 ENCOUNTER — Ambulatory Visit: Payer: Medicare Other | Admitting: General Surgery

## 2019-06-25 ENCOUNTER — Other Ambulatory Visit: Payer: Self-pay

## 2019-06-25 ENCOUNTER — Ambulatory Visit (INDEPENDENT_AMBULATORY_CARE_PROVIDER_SITE_OTHER): Payer: Medicare Other | Admitting: General Surgery

## 2019-06-25 ENCOUNTER — Encounter: Payer: Self-pay | Admitting: General Surgery

## 2019-06-25 VITALS — BP 159/96 | HR 97 | Temp 98.0°F | Resp 16 | Ht 72.0 in | Wt 285.0 lb

## 2019-06-25 DIAGNOSIS — K610 Anal abscess: Secondary | ICD-10-CM

## 2019-06-25 MED ORDER — SULFAMETHOXAZOLE-TRIMETHOPRIM 800-160 MG PO TABS: 1.0000 | ORAL_TABLET | Freq: Two times a day (BID) | ORAL | 0 refills | Status: DC

## 2019-06-25 NOTE — Progress Notes (Signed)
Thomas Donovan; [id number]; 1961/06/22   HPI Patient is a 58 year old white male who was referred to my care by [facility] for evaluation treatment of a recurrent perineal abscess.  Patient states he had incision of the drainage of the perineal abscess last year.  Since that time, he has had intermittent episodes of drainage of yellow fluid.  It does swell up and gets tender to touch.  It does extend up to the base of the scrotum.  He does state that sometimes he has pain during intercourse in the perineal region.  He has not been on antibiotics for some time now.  He was supposed to see me earlier this year, but COVID-19 occurred.  He states the pain is 10 out of 10 when it does occur.  He denies passing any air or stool in that region.  He denies any incontinence. Past Medical History:  Diagnosis Date  . Aortic aneurysm ([place])   . Chronic back pain   . Cold    recent rx  . Coronary artery disease   . Degeneration of lumbar intervertebral disc   . Diabetes mellitus   . GERD (gastroesophageal reflux disease)   . Gout   . History of kidney stones   . Hypertension   . Pneumonia     Past Surgical History:  Procedure Laterality Date  . BACK SURGERY    . MAXIMUM ACCESS (MAS)POSTERIOR LUMBAR INTERBODY FUSION (PLIF) 1 LEVEL  11/20/2013   Procedure: FOR MAXIMUM ACCESS (MAS) POSTERIOR LUMBAR INTERBODY FUSION LUMBAR FIVE-SACRAL ONE;  Surgeon: Eustace Moore, MD;  Location: [facility];  Service: Neurosurgery;;  FOR MAXIMUM ACCESS (MAS) POSTERIOR LUMBAR INTERBODY FUSION LUMBAR FIVE-SACRAL ONE  . TONSILLECTOMY      History reviewed. No pertinent family history.  Current Outpatient Medications on File Prior to Visit  Medication Sig Dispense Refill  . colchicine 0.6 MG tablet Take 1-2 tablets (0.6-1.2 mg total) by mouth 2 (two) times daily as needed (for gout flares). 20 tablet 0  . gabapentin (NEURONTIN) 800 MG tablet Take 800 mg by mouth 4 (four) times daily.    Marland Kitchen losartan (COZAAR) 50 MG  tablet Take 50 mg by mouth daily.    . metFORMIN (GLUCOPHAGE-XR) 500 MG 24 hr tablet Take 1-2 tablets (500-1,000 mg total) by mouth 2 (two) times daily. Pt takes two tablets in the morning and one at bedtime. (Patient taking differently: Take 500 mg by mouth 2 (two) times daily. Pt takes two tablets in the morning and one at bedtime.) 45 tablet 0  . omeprazole (PRILOSEC) 40 MG capsule Take 40 mg by mouth daily.     Marland Kitchen oxyCODONE-acetaminophen (PERCOCET) 7.5-325 MG tablet Take 1 tablet by mouth 3 (three) times daily.    . predniSONE (DELTASONE) 10 MG tablet Take 6 tablets day one, 5 tablets day two, 4 tablets day three, 3 tablets day four, 2 tablets day five, then 1 tablet day six 21 tablet 0  . cyclobenzaprine (FLEXERIL) 10 MG tablet Take 1 tablet (10 mg total) by mouth 3 (three) times daily. (Patient not taking: Reported on 06/25/2019) 30 tablet 0  . HYDROmorphone (DILAUDID) 4 MG tablet Take 1 tablet (4 mg total) by mouth every 4 (four) hours as needed for severe pain. (Patient not taking: Reported on 06/25/2019) 20 tablet 0  . tamsulosin (FLOMAX) 0.4 MG CAPS capsule Take 1 capsule (0.4 mg total) by mouth daily. (Patient not taking: Reported on 06/25/2019) 10 capsule 0   No current facility-administered medications on  file prior to visit.     Allergies  Allergen Reactions  . Aspirin Other (See Comments)    Medication is contraindicated with pts gout medicine.    . Ibuprofen Other (See Comments)    Reaction:  GI bleeding   . Toradol [Ketorolac Tromethamine] Other (See Comments)    Reaction:  Blisters between fingers   . Tramadol Hives         Social History   Substance and Sexual Activity  Alcohol Use Yes  . Alcohol/week: 12.0 standard drinks  . Types: 12 Cans of beer per week   Comment: every 2 or 3 days    Social History   Tobacco Use  Smoking Status Current Every Day Smoker  . Packs/day: 0.50  . Years: 10.00  . Pack years: 5.00  . Types: Cigarettes  Smokeless Tobacco Never  Used    Review of Systems  Constitutional: Negative.   HENT: Negative.   Eyes: Positive for blurred vision and double vision.  Respiratory: Positive for shortness of breath and wheezing.   Cardiovascular: Negative.   Gastrointestinal: Positive for heartburn.  Genitourinary: Positive for dysuria and urgency.  Musculoskeletal: Positive for back pain.  Skin: Negative.   Neurological: Negative.   Endo/Heme/Allergies: Negative.   Psychiatric/Behavioral: Negative.     Objective   Vitals:   06/25/19 1359 06/25/19 1403  BP: (!) 172/113 (!) 159/96  Pulse: 97   Resp: 16   Temp: 98 F (36.7 C)   SpO2: 95%     Physical Exam Constitutional:      Appearance: Normal appearance. He is obese. He is not ill-appearing.  HENT:     Head: Normocephalic and atraumatic.  Cardiovascular:     Rate and Rhythm: Normal rate and regular rhythm.     Heart sounds: Normal heart sounds. No murmur. No friction rub. No gallop.   Pulmonary:     Effort: Pulmonary effort is normal. No respiratory distress.     Breath sounds: Normal breath sounds. No stridor. No wheezing, rhonchi or rales.  Genitourinary:    Penis: Normal.      Rectum: Normal.     Comments: Subcutaneous indurated cord noted extending from the 11 o'clock position of the anus towards the scrotum.  No obvious drainage noted.  No fistula appreciated.  No erythema present. Skin:    General: Skin is warm and dry.  Neurological:     Mental Status: He is alert and oriented to person, place, and time.    Previous notes reviewed from epic. Assessment  Recurrent perianal abscess.  Suspect granulation tissue in fistulous tract. Plan   Bactrim DS 1 tablet twice a day for 10 days.  Will reassess patient in 2 weeks.  May need further exploration of this area to remove the granulation tissue and assess for a fistula.  Patient understands and agrees.

## 2019-07-04 ENCOUNTER — Telehealth: Payer: Self-pay

## 2019-07-04 NOTE — Telephone Encounter (Signed)
Pt called stating that the antibiotics were making him nauseous. Discussed with patient that he should be taking it with food, he states he is but it is not helping. Will discuss with MD.

## 2019-07-11 ENCOUNTER — Other Ambulatory Visit: Payer: Self-pay

## 2019-07-11 ENCOUNTER — Ambulatory Visit (INDEPENDENT_AMBULATORY_CARE_PROVIDER_SITE_OTHER): Payer: Medicare Other | Admitting: General Surgery

## 2019-07-11 ENCOUNTER — Encounter: Payer: Self-pay | Admitting: General Surgery

## 2019-07-11 VITALS — BP 178/109 | HR 87 | Temp 97.7°F | Resp 18 | Ht 72.0 in | Wt 284.0 lb

## 2019-07-11 DIAGNOSIS — L02215 Cutaneous abscess of perineum: Secondary | ICD-10-CM

## 2019-07-11 NOTE — Progress Notes (Signed)
Subjective:     Thomas Donovan  Here for follow-up visit.  Still has induration and pain in the perineum extending up to the base of the scrotum.  He also had a small abscess that drained from the perianal region. Objective:    BP (!) 178/109 (BP Location: Left Arm, Patient Position: Sitting, Cuff Size: Normal)   Pulse 87   Temp 97.7 F (36.5 C) (Tympanic)   Resp 18   Ht 6' (1.829 m)   Wt 284 lb (128.8 kg)   SpO2 96%   BMI 38.52 kg/m   General:  alert, cooperative and no distress  Indurated and tender linear area extending up to the base of the scrotum in the perineal region.  No active drainage noted.     Assessment:    Perineal abscess    Plan:   Patient is scheduled for incision and drainage of perineal abscess on 07/15/2019.  The risks and benefits of the procedure including bleeding, infection, and recurrence of the abscess were fully explained to the patient, who gave informed consent.  He is undergoing treatment through a chronic pain specialist.  Will give patient Percocet 7.5 mg temporarily postoperatively.  He will notify the pain clinic that he is undergoing surgery.

## 2019-07-11 NOTE — H&P (Signed)
Thomas Donovan; [id number]; 1961/06/22   HPI Patient is a 58 year old white male who was referred to my care by [facility] for evaluation treatment of a recurrent perineal abscess.  Patient states he had incision of the drainage of the perineal abscess last year.  Since that time, he has had intermittent episodes of drainage of yellow fluid.  It does swell up and gets tender to touch.  It does extend up to the base of the scrotum.  He does state that sometimes he has pain during intercourse in the perineal region.  He has not been on antibiotics for some time now.  He was supposed to see me earlier this year, but COVID-19 occurred.  He states the pain is 10 out of 10 when it does occur.  He denies passing any air or stool in that region.  He denies any incontinence. Past Medical History:  Diagnosis Date  . Aortic aneurysm ([place])   . Chronic back pain   . Cold    recent rx  . Coronary artery disease   . Degeneration of lumbar intervertebral disc   . Diabetes mellitus   . GERD (gastroesophageal reflux disease)   . Gout   . History of kidney stones   . Hypertension   . Pneumonia     Past Surgical History:  Procedure Laterality Date  . BACK SURGERY    . MAXIMUM ACCESS (MAS)POSTERIOR LUMBAR INTERBODY FUSION (PLIF) 1 LEVEL  11/20/2013   Procedure: FOR MAXIMUM ACCESS (MAS) POSTERIOR LUMBAR INTERBODY FUSION LUMBAR FIVE-SACRAL ONE;  Surgeon: Eustace Moore, MD;  Location: [facility];  Service: Neurosurgery;;  FOR MAXIMUM ACCESS (MAS) POSTERIOR LUMBAR INTERBODY FUSION LUMBAR FIVE-SACRAL ONE  . TONSILLECTOMY      History reviewed. No pertinent family history.  Current Outpatient Medications on File Prior to Visit  Medication Sig Dispense Refill  . colchicine 0.6 MG tablet Take 1-2 tablets (0.6-1.2 mg total) by mouth 2 (two) times daily as needed (for gout flares). 20 tablet 0  . gabapentin (NEURONTIN) 800 MG tablet Take 800 mg by mouth 4 (four) times daily.    Marland Kitchen losartan (COZAAR) 50 MG  tablet Take 50 mg by mouth daily.    . metFORMIN (GLUCOPHAGE-XR) 500 MG 24 hr tablet Take 1-2 tablets (500-1,000 mg total) by mouth 2 (two) times daily. Pt takes two tablets in the morning and one at bedtime. (Patient taking differently: Take 500 mg by mouth 2 (two) times daily. Pt takes two tablets in the morning and one at bedtime.) 45 tablet 0  . omeprazole (PRILOSEC) 40 MG capsule Take 40 mg by mouth daily.     Marland Kitchen oxyCODONE-acetaminophen (PERCOCET) 7.5-325 MG tablet Take 1 tablet by mouth 3 (three) times daily.    . predniSONE (DELTASONE) 10 MG tablet Take 6 tablets day one, 5 tablets day two, 4 tablets day three, 3 tablets day four, 2 tablets day five, then 1 tablet day six 21 tablet 0  . cyclobenzaprine (FLEXERIL) 10 MG tablet Take 1 tablet (10 mg total) by mouth 3 (three) times daily. (Patient not taking: Reported on 06/25/2019) 30 tablet 0  . HYDROmorphone (DILAUDID) 4 MG tablet Take 1 tablet (4 mg total) by mouth every 4 (four) hours as needed for severe pain. (Patient not taking: Reported on 06/25/2019) 20 tablet 0  . tamsulosin (FLOMAX) 0.4 MG CAPS capsule Take 1 capsule (0.4 mg total) by mouth daily. (Patient not taking: Reported on 06/25/2019) 10 capsule 0   No current facility-administered medications on  file prior to visit.     Allergies  Allergen Reactions  . Aspirin Other (See Comments)    Medication is contraindicated with pts gout medicine.    . Ibuprofen Other (See Comments)    Reaction:  GI bleeding   . Toradol [Ketorolac Tromethamine] Other (See Comments)    Reaction:  Blisters between fingers   . Tramadol Hives         Social History   Substance and Sexual Activity  Alcohol Use Yes  . Alcohol/week: 12.0 standard drinks  . Types: 12 Cans of beer per week   Comment: every 2 or 3 days    Social History   Tobacco Use  Smoking Status Current Every Day Smoker  . Packs/day: 0.50  . Years: 10.00  . Pack years: 5.00  . Types: Cigarettes  Smokeless Tobacco Never  Used    Review of Systems  Constitutional: Negative.   HENT: Negative.   Eyes: Positive for blurred vision and double vision.  Respiratory: Positive for shortness of breath and wheezing.   Cardiovascular: Negative.   Gastrointestinal: Positive for heartburn.  Genitourinary: Positive for dysuria and urgency.  Musculoskeletal: Positive for back pain.  Skin: Negative.   Neurological: Negative.   Endo/Heme/Allergies: Negative.   Psychiatric/Behavioral: Negative.     Objective   Vitals:   06/25/19 1359 06/25/19 1403  BP: (!) 172/113 (!) 159/96  Pulse: 97   Resp: 16   Temp: 98 F (36.7 C)   SpO2: 95%     Physical Exam Constitutional:      Appearance: Normal appearance. He is obese. He is not ill-appearing.  HENT:     Head: Normocephalic and atraumatic.  Cardiovascular:     Rate and Rhythm: Normal rate and regular rhythm.     Heart sounds: Normal heart sounds. No murmur. No friction rub. No gallop.   Pulmonary:     Effort: Pulmonary effort is normal. No respiratory distress.     Breath sounds: Normal breath sounds. No stridor. No wheezing, rhonchi or rales.  Genitourinary:    Penis: Normal.      Rectum: Normal.     Comments: Subcutaneous indurated cord noted extending from the 11 o'clock position of the anus towards the scrotum.  No obvious drainage noted.  No fistula appreciated.  No erythema present. Skin:    General: Skin is warm and dry.  Neurological:     Mental Status: He is alert and oriented to person, place, and time.    Previous notes reviewed from epic. Assessment  Recurrent perianal abscess.  Suspect granulation tissue in fistulous tract. Plan   Bactrim DS 1 tablet twice a day for 10 days.  Will reassess patient in 2 weeks.  May need further exploration of this area to remove the granulation tissue and assess for a fistula.  Patient understands and agrees. 

## 2019-07-12 ENCOUNTER — Encounter (HOSPITAL_COMMUNITY): Payer: Self-pay

## 2019-07-12 ENCOUNTER — Encounter (HOSPITAL_COMMUNITY): Admission: RE | Admit: 2019-07-12 | Payer: Medicare Other | Source: Ambulatory Visit

## 2019-07-12 ENCOUNTER — Other Ambulatory Visit (HOSPITAL_COMMUNITY)
Admission: RE | Admit: 2019-07-12 | Discharge: 2019-07-12 | Disposition: A | Discharge status: Home / Self Care | Payer: Medicare Other | Source: Ambulatory Visit | Attending: General Surgery | Admitting: General Surgery

## 2019-07-12 DIAGNOSIS — Z1159 Encounter for screening for other viral diseases: Secondary | ICD-10-CM | POA: Insufficient documentation

## 2019-07-12 LAB — SARS CORONAVIRUS 2 (TAT 6-24 HRS): SARS Coronavirus 2: NEGATIVE

## 2019-07-15 ENCOUNTER — Encounter (HOSPITAL_COMMUNITY): Payer: Self-pay

## 2019-07-15 ENCOUNTER — Ambulatory Visit (HOSPITAL_COMMUNITY)
Admission: RE | Admit: 2019-07-15 | Discharge: 2019-07-15 | Disposition: A | Discharge status: Home / Self Care | Payer: Medicare Other | Attending: General Surgery | Admitting: General Surgery

## 2019-07-15 ENCOUNTER — Encounter (HOSPITAL_COMMUNITY)
Admission: RE | Disposition: A | Discharge status: Home / Self Care | Payer: Self-pay | Source: Home / Self Care | Attending: General Surgery

## 2019-07-15 ENCOUNTER — Ambulatory Visit (HOSPITAL_COMMUNITY): Payer: Medicare Other | Admitting: Anesthesiology

## 2019-07-15 ENCOUNTER — Other Ambulatory Visit: Payer: Self-pay

## 2019-07-15 DIAGNOSIS — Z79899 Other long term (current) drug therapy: Secondary | ICD-10-CM | POA: Diagnosis not present

## 2019-07-15 DIAGNOSIS — G8929 Other chronic pain: Secondary | ICD-10-CM | POA: Diagnosis not present

## 2019-07-15 DIAGNOSIS — L928 Other granulomatous disorders of the skin and subcutaneous tissue: Secondary | ICD-10-CM | POA: Insufficient documentation

## 2019-07-15 DIAGNOSIS — K219 Gastro-esophageal reflux disease without esophagitis: Secondary | ICD-10-CM | POA: Diagnosis not present

## 2019-07-15 DIAGNOSIS — Z885 Allergy status to narcotic agent status: Secondary | ICD-10-CM | POA: Insufficient documentation

## 2019-07-15 DIAGNOSIS — M1990 Unspecified osteoarthritis, unspecified site: Secondary | ICD-10-CM | POA: Insufficient documentation

## 2019-07-15 DIAGNOSIS — L02215 Cutaneous abscess of perineum: Secondary | ICD-10-CM | POA: Diagnosis not present

## 2019-07-15 DIAGNOSIS — M109 Gout, unspecified: Secondary | ICD-10-CM | POA: Diagnosis not present

## 2019-07-15 DIAGNOSIS — M549 Dorsalgia, unspecified: Secondary | ICD-10-CM | POA: Insufficient documentation

## 2019-07-15 DIAGNOSIS — I2510 Atherosclerotic heart disease of native coronary artery without angina pectoris: Secondary | ICD-10-CM | POA: Insufficient documentation

## 2019-07-15 DIAGNOSIS — I10 Essential (primary) hypertension: Secondary | ICD-10-CM | POA: Insufficient documentation

## 2019-07-15 DIAGNOSIS — Z7984 Long term (current) use of oral hypoglycemic drugs: Secondary | ICD-10-CM | POA: Insufficient documentation

## 2019-07-15 DIAGNOSIS — Z886 Allergy status to analgesic agent status: Secondary | ICD-10-CM | POA: Diagnosis not present

## 2019-07-15 DIAGNOSIS — Z79891 Long term (current) use of opiate analgesic: Secondary | ICD-10-CM | POA: Insufficient documentation

## 2019-07-15 DIAGNOSIS — F17210 Nicotine dependence, cigarettes, uncomplicated: Secondary | ICD-10-CM | POA: Insufficient documentation

## 2019-07-15 DIAGNOSIS — E119 Type 2 diabetes mellitus without complications: Secondary | ICD-10-CM | POA: Diagnosis not present

## 2019-07-15 HISTORY — PX: IRRIGATION AND DEBRIDEMENT ABSCESS: SHX5252

## 2019-07-15 LAB — BASIC METABOLIC PANEL
Anion gap: 10 (ref 5–15)
BUN: 15 mg/dL (ref 6–20)
CO2: 22 mmol/L (ref 22–32)
Calcium: 9.2 mg/dL (ref 8.9–10.3)
Chloride: 104 mmol/L (ref 98–111)
Creatinine, Ser: 1.17 mg/dL (ref 0.61–1.24)
GFR calc Af Amer: >60 mL/min (ref >60)
GFR calc non Af Amer: >60 mL/min (ref >60)
Glucose, Bld: 274 mg/dL — ABNORMAL HIGH (ref 70–99)
Potassium: 4.5 mmol/L (ref 3.5–5.1)
Sodium: 136 mmol/L (ref 135–145)

## 2019-07-15 LAB — GLUCOSE, CAPILLARY
Glucose-Capillary: 282 mg/dL — ABNORMAL HIGH (ref 70–99)
Glucose-Capillary: 252 mg/dL — ABNORMAL HIGH (ref 70–99)

## 2019-07-15 LAB — HEMOGLOBIN A1C
Hgb A1c MFr Bld: 8.7 % — ABNORMAL HIGH (ref 4.8–5.6)
Mean Plasma Glucose: 202.99 mg/dL

## 2019-07-15 SURGERY — IRRIGATION AND DEBRIDEMENT ABSCESS
Anesthesia: General

## 2019-07-15 MED ORDER — BUPIVACAINE HCL (PF) 0.5 % IJ SOLN
INTRAMUSCULAR | Status: DC | PRN
  Administered 2019-07-15: 10 mL

## 2019-07-15 MED ORDER — MIDAZOLAM HCL 2 MG/2ML IJ SOLN
INTRAMUSCULAR | Status: AC
  Filled 2019-07-15: qty 2

## 2019-07-15 MED ORDER — ROCURONIUM BROMIDE 10 MG/ML (PF) SYRINGE
PREFILLED_SYRINGE | INTRAVENOUS | Status: DC | PRN
  Administered 2019-07-15: 20 mg via INTRAVENOUS

## 2019-07-15 MED ORDER — PROPOFOL 10 MG/ML IV BOLUS
INTRAVENOUS | Status: DC | PRN
  Administered 2019-07-15: 250 mg via INTRAVENOUS

## 2019-07-15 MED ORDER — PROMETHAZINE HCL 25 MG/ML IJ SOLN: 6.2500 mg | INTRAMUSCULAR | Status: DC | PRN

## 2019-07-15 MED ORDER — SUCCINYLCHOLINE CHLORIDE 200 MG/10ML IV SOSY
PREFILLED_SYRINGE | INTRAVENOUS | Status: AC
  Filled 2019-07-15: qty 10

## 2019-07-15 MED ORDER — FENTANYL CITRATE (PF) 250 MCG/5ML IJ SOLN
INTRAMUSCULAR | Status: AC
  Filled 2019-07-15: qty 5

## 2019-07-15 MED ORDER — SODIUM CHLORIDE 0.9 % IV SOLN
2.0000 g | INTRAVENOUS | Status: AC
  Administered 2019-07-15: 09:00:00 2 g via INTRAVENOUS
  Filled 2019-07-15: qty 2

## 2019-07-15 MED ORDER — HYDROCODONE-ACETAMINOPHEN 7.5-325 MG PO TABS: 1.0000 | ORAL_TABLET | Freq: Once | ORAL | Status: DC | PRN

## 2019-07-15 MED ORDER — CHLORHEXIDINE GLUCONATE CLOTH 2 % EX PADS: 6.0000 | MEDICATED_PAD | Freq: Once | CUTANEOUS | Status: DC

## 2019-07-15 MED ORDER — MIDAZOLAM HCL 2 MG/2ML IJ SOLN: 0.5000 mg | Freq: Once | INTRAMUSCULAR | Status: DC | PRN

## 2019-07-15 MED ORDER — HYDROGEN PEROXIDE 3 % EX SOLN
CUTANEOUS | Status: DC | PRN
  Administered 2019-07-15: 1

## 2019-07-15 MED ORDER — SUCCINYLCHOLINE CHLORIDE 200 MG/10ML IV SOSY
PREFILLED_SYRINGE | INTRAVENOUS | Status: DC | PRN
  Administered 2019-07-15: 140 mg via INTRAVENOUS

## 2019-07-15 MED ORDER — BUPIVACAINE HCL (PF) 0.5 % IJ SOLN
INTRAMUSCULAR | Status: AC
  Filled 2019-07-15: qty 30

## 2019-07-15 MED ORDER — LIDOCAINE 2% (20 MG/ML) 5 ML SYRINGE
INTRAMUSCULAR | Status: AC
  Filled 2019-07-15: qty 5

## 2019-07-15 MED ORDER — ROCURONIUM BROMIDE 10 MG/ML (PF) SYRINGE
PREFILLED_SYRINGE | INTRAVENOUS | Status: AC
  Filled 2019-07-15: qty 10

## 2019-07-15 MED ORDER — ONDANSETRON HCL 4 MG/2ML IJ SOLN
INTRAMUSCULAR | Status: AC
  Filled 2019-07-15: qty 2

## 2019-07-15 MED ORDER — HYDROMORPHONE HCL 1 MG/ML IJ SOLN
0.2500 mg | INTRAMUSCULAR | Status: DC | PRN
  Administered 2019-07-15: 0.5 mg via INTRAVENOUS
  Filled 2019-07-15: qty 0.5

## 2019-07-15 MED ORDER — ONDANSETRON HCL 4 MG/2ML IJ SOLN
INTRAMUSCULAR | Status: DC | PRN
  Administered 2019-07-15: 4 mg via INTRAVENOUS

## 2019-07-15 MED ORDER — LIDOCAINE HCL (PF) 2 % IJ SOLN
INTRAMUSCULAR | Status: DC | PRN
  Administered 2019-07-15: 60 mg via INTRADERMAL

## 2019-07-15 MED ORDER — MIDAZOLAM HCL 5 MG/5ML IJ SOLN
INTRAMUSCULAR | Status: DC | PRN
  Administered 2019-07-15 (×2): 1 mg via INTRAVENOUS

## 2019-07-15 MED ORDER — PROPOFOL 10 MG/ML IV BOLUS
INTRAVENOUS | Status: AC
  Filled 2019-07-15: qty 40

## 2019-07-15 MED ORDER — OXYCODONE-ACETAMINOPHEN 7.5-325 MG PO TABS: 1.0000 | ORAL_TABLET | Freq: Four times a day (QID) | ORAL | 0 refills | Status: AC | PRN

## 2019-07-15 MED ORDER — LACTATED RINGERS IV SOLN
INTRAVENOUS | Status: DC
  Administered 2019-07-15: 09:00:00 via INTRAVENOUS

## 2019-07-15 MED ORDER — SUGAMMADEX SODIUM 200 MG/2ML IV SOLN
INTRAVENOUS | Status: DC | PRN
  Administered 2019-07-15: 200 mg via INTRAVENOUS

## 2019-07-15 MED ORDER — FENTANYL CITRATE (PF) 100 MCG/2ML IJ SOLN
INTRAMUSCULAR | Status: DC | PRN
  Administered 2019-07-15 (×5): 50 ug via INTRAVENOUS

## 2019-07-15 SURGICAL SUPPLY — 27 items
BLADE 11 SAFETY STRL DISP (BLADE) ×3 IMPLANT
CANNULA VESSEL 3MM 2 BLNT TIP (CANNULA) ×2 IMPLANT
CLOTH BEACON ORANGE TIMEOUT ST (SAFETY) ×3 IMPLANT
COVER LIGHT HANDLE STERIS (MISCELLANEOUS) ×6 IMPLANT
COVER WAND RF STERILE (DRAPES) ×2 IMPLANT
DECANTER SPIKE VIAL GLASS SM (MISCELLANEOUS) ×3 IMPLANT
DRAPE HALF SHEET 40X57 (DRAPES) ×2 IMPLANT
DRAPE PROXIMA HALF (DRAPES) ×3 IMPLANT
ELECT REM PT RETURN 9FT ADLT (ELECTROSURGICAL) ×3
ELECTRODE REM PT RTRN 9FT ADLT (ELECTROSURGICAL) ×1 IMPLANT
GAUZE IODOFORM PACK 1/2 7832 (GAUZE/BANDAGES/DRESSINGS) ×2 IMPLANT
GAUZE SPONGE 4X4 12PLY STRL (GAUZE/BANDAGES/DRESSINGS) ×4 IMPLANT
GLOVE BIOGEL PI IND STRL 7.0 (GLOVE) ×1 IMPLANT
GLOVE BIOGEL PI INDICATOR 7.0 (GLOVE) ×4
GLOVE SURG SS PI 7.5 STRL IVOR (GLOVE) ×4 IMPLANT
GOWN STRL REUS W/TWL LRG LVL3 (GOWN DISPOSABLE) ×7 IMPLANT
KIT TURNOVER KIT A (KITS) ×3 IMPLANT
MANIFOLD NEPTUNE II (INSTRUMENTS) ×3 IMPLANT
NDL HYPO 25X1 1.5 SAFETY (NEEDLE) IMPLANT
NEEDLE HYPO 25X1 1.5 SAFETY (NEEDLE) ×3 IMPLANT
NS IRRIG 1000ML POUR BTL (IV SOLUTION) ×3 IMPLANT
PACK PERI GYN (CUSTOM PROCEDURE TRAY) ×2 IMPLANT
PAD ABD 5X9 TENDERSORB (GAUZE/BANDAGES/DRESSINGS) ×2 IMPLANT
PAD ARMBOARD 7.5X6 YLW CONV (MISCELLANEOUS) ×3 IMPLANT
SET BASIN LINEN APH (SET/KITS/TRAYS/PACK) ×3 IMPLANT
SYR CONTROL 10ML LL (SYRINGE) ×4 IMPLANT
SYRINGE 20CC LL (MISCELLANEOUS) ×2 IMPLANT

## 2019-07-15 NOTE — Anesthesia Preprocedure Evaluation (Signed)
Anesthesia Evaluation  Patient identified by MRN, date of birth, ID band Patient awake    Reviewed: Allergy & Precautions, NPO status , Patient's Chart, lab work & pertinent test results  Airway Mallampati: II  TM Distance: >3 FB Neck ROM: Full    Dental no notable dental hx. (+) Partial Upper   Pulmonary shortness of breath and with exertion, sleep apnea , pneumonia, resolved, Current Smoker,  Reports  with CPAP =states its broken   Pulmonary exam normal breath sounds clear to auscultation       Cardiovascular Exercise Tolerance: Poor hypertension, Pt. on medications + CAD  Normal cardiovascular examII Rhythm:Regular Rate:Normal  Poor ET  Denies CP/DOE Reports claudications -   Neuro/Psych negative neurological ROS  negative psych ROS   GI/Hepatic Neg liver ROS, GERD  Medicated and Controlled,  Endo/Other  negative endocrine ROSdiabetes, Well Controlled, Type 2, Oral Hypoglycemic Agents  Renal/GU negative Renal ROS  negative genitourinary   Musculoskeletal  (+) Arthritis , Osteoarthritis,    Abdominal   Peds negative pediatric ROS (+)  Hematology negative hematology ROS (+)   Anesthesia Other Findings   Reproductive/Obstetrics negative OB ROS                             Anesthesia Physical Anesthesia Plan  ASA: III  Anesthesia Plan: General   Post-op Pain Management:    Induction: Intravenous  PONV Risk Score and Plan: 1 and Ondansetron  Airway Management Planned: Oral ETT  Additional Equipment:   Intra-op Plan:   Post-operative Plan: Extubation in OR  Informed Consent: I have reviewed the patients History and Physical, chart, labs and discussed the procedure including the risks, benefits and alternatives for the proposed anesthesia with the patient or authorized representative who has indicated his/her understanding and acceptance.     Dental advisory  given  Plan Discussed with: CRNA  Anesthesia Plan Comments: (Plan Full PPE use  Plan GETA -WTP)        Anesthesia Quick Evaluation

## 2019-07-15 NOTE — Anesthesia Postprocedure Evaluation (Signed)
Anesthesia Post Note  Patient: Thomas Donovan  Procedure(s) Performed: IRRIGATION AND DRAINAGE OFPERINEAL ABSCESS (N/A )  Patient location during evaluation: PACU Anesthesia Type: General Level of consciousness: awake and alert, oriented and patient cooperative Pain management: pain level controlled Vital Signs Assessment: post-procedure vital signs reviewed and stable Respiratory status: spontaneous breathing Cardiovascular status: stable Postop Assessment: no apparent nausea or vomiting Anesthetic complications: no     Last Vitals:  Vitals:   07/15/19 0950 07/15/19 1000  BP:  (!) 131/98  Pulse: 88 90  Resp:  (!) 22  Temp: 36.9 C   SpO2:  97%    Last Pain:  Vitals:   07/15/19 0846  TempSrc: Oral                 ADAMS, AMY A

## 2019-07-15 NOTE — Anesthesia Procedure Notes (Signed)
Procedure Name: Intubation Date/Time: 07/15/2019 9:07 AM Performed by: Lenice Llamas, MD Pre-anesthesia Checklist: Patient identified, Patient being monitored, Timeout performed, Emergency Drugs available and Suction available Patient Re-evaluated:Patient Re-evaluated prior to induction Oxygen Delivery Method: Circle System Utilized Preoxygenation: Pre-oxygenation with 100% oxygen Induction Type: IV induction Ventilation: Mask ventilation without difficulty Laryngoscope Size: 3 and Miller Grade View: Grade II Tube type: Oral Tube size: 7.0 mm Number of attempts: 1 Airway Equipment and Method: stylet Placement Confirmation: ETT inserted through vocal cords under direct vision,  positive ETCO2 and breath sounds checked- equal and bilateral Secured at: 21 cm Tube secured with: Tape Dental Injury: Teeth and Oropharynx as per pre-operative assessment

## 2019-07-15 NOTE — Transfer of Care (Signed)
Immediate Anesthesia Transfer of Care Note  Patient: Thomas Donovan  Procedure(s) Performed: IRRIGATION AND DRAINAGE OFPERINEAL ABSCESS (N/A )  Patient Location: PACU  Anesthesia Type:General  Level of Consciousness: awake, alert  and oriented  Airway & Oxygen Therapy: Patient connected to nasal cannula oxygen  Post-op Assessment: Report given to RN, Post -op Vital signs reviewed and stable and Patient moving all extremities X 4  Post vital signs: Reviewed and stable  Last Vitals:  Vitals Value Taken Time  BP 137/70 07/15/19 0950  Temp    Pulse 90 07/15/19 0951  Resp 21 07/15/19 0951  SpO2 98 % 07/15/19 0951  Vitals shown include unvalidated device data.  Last Pain:  Vitals:   07/15/19 0846  TempSrc: Oral      Patients Stated Pain Goal: 7 ([id number])  Complications: No apparent anesthesia complications

## 2019-07-15 NOTE — Op Note (Signed)
Patient:  Thomas Donovan  DOB:  May 07, 1961  MRN:  [id number]   Preop Diagnosis: Abscess of perineum  Postop Diagnosis: Subcutaneous granuloma of perineum  Procedure: Incision and debridement of perineal wound  Surgeon: Aviva Signs, MD  Anes: General endotracheal  Indications: Patient is a 58 year old white male who presents with occasional yellow drainage from a perineal wound that extends in a linear fashion from the perianal skin to the base of the scrotum.  The risks and benefits of the procedure including bleeding, infection, and recurrence of the infection were fully explained to the patient, who gave informed consent.  Procedure note: The patient was placed in lithotomy position after induction of general endotracheal anesthesia.  The perineum was prepped and draped using usual sterile technique with Betadine.  Surgical site confirmation was performed.  There was an area of indurated tissue that started at the 11 o'clock position just outside the anal verge towards the base of the scrotum.  An incision was made in this region and no abscess cavity was found.  Granulation tissue was noted in the subcutaneous tissue.  I suspect this was in the resolved abscess with granuloma formation.  The granulomatous tissue was curetted and removed.  Hydrogen peroxide was then instilled and used to irrigate the wound.  The total did not extend deep to the muscle or to the scrotal fascia.  There was no extension into the rectum.  A bleeding was controlled using Bovie electrocautery.  0.5% Sensorcaine was instilled into the surrounding wound.  The wound was packed with iodoform Nu Gauze.  All tape and needle counts were correct at the end of the procedure.  The patient was extubated in the operating room and transferred to PACU in stable condition.  Complications: None  EBL: Minimal  Specimen: None

## 2019-07-15 NOTE — Interval H&P Note (Signed)
History and Physical Interval Note:  07/15/2019 8:14 AM  Thomas Donovan  has presented today for surgery, with the diagnosis of perineal abscess.  The various methods of treatment have been discussed with the patient and family. After consideration of risks, benefits and other options for treatment, the patient has consented to  Procedure(s): IRRIGATION AND DEBRIDEMENT PERINEAL ABSCESS (N/A) as a surgical intervention.  The patient's history has been reviewed, patient examined, no change in status, stable for surgery.  I have reviewed the patient's chart and labs.  Questions were answered to the patient's satisfaction.     Aviva Signs

## 2019-07-15 NOTE — Discharge Instructions (Signed)
Remove perineal packing tomorrow, then start Sitz bath twice a day.   How to Take a [employer] A sitz bath is a warm water bath that may be used to care for your rectum, genital area, or the area between your rectum and genitals (perineum). For a sitz bath, the water only comes up to your hips and covers your buttocks. A sitz bath may done at home in a bathtub or with a portable sitz bath that fits over the toilet. Your health care provider may recommend a sitz bath to help:  Relieve pain and discomfort after delivering a baby.  Relieve pain and itching from hemorrhoids or anal fissures.  Relieve pain after certain surgeries.  Relax muscles that are sore or tight. How to take a sitz bath Take 3-4 sitz baths a day, or as many as told by your health care provider. Bathtub sitz bath To take a sitz bath in a bathtub: 1. Partially fill a bathtub with warm water. The water should be deep enough to cover your hips and buttocks when you are sitting in the tub. 2. If your health care provider told you to put medicine in the water, follow his or her instructions. 3. Sit in the water. 4. Open the tub drain a little, and leave it open during your bath. 5. Turn on the warm water again, enough to replace the water that is draining out. Keep the water running throughout your bath. This helps keep the water at the right level and the right temperature. 6. Soak in the water for 15-20 minutes, or as long as told by your health care provider. 7. When you are done, be careful when you stand up. You may feel dizzy. 8. After the sitz bath, pat yourself dry. Do not rub your skin to dry it.  Over-the-toilet sitz bath To take a sitz bath with an over-the-toilet basin: 1. Follow the manufacturer's instructions. 2. Fill the basin with warm water. 3. If your health care provider told you to put medicine in the water, follow his or her instructions. 4. Sit on the seat. Make sure the water covers your buttocks and  perineum. 5. Soak in the water for 15-20 minutes, or as long as told by your health care provider. 6. After the sitz bath, pat yourself dry. Do not rub your skin to dry it. 7. Clean and dry the basin between uses. 8. Discard the basin if it cracks, or according to the manufacturer's instructions. Contact a health care provider if:  Your symptoms get worse. Do not continue with sitz baths if your symptoms get worse.  You have new symptoms. If this happens, do not continue with sitz baths until you talk with your health care provider. Summary  A sitz bath is a warm water bath in which the water only comes up to your hips and covers your buttocks.  A sitz bath may help relieve itching, relieve pain, and relax muscles that are sore or tight in the lower part of your body, including your genital area.  Take 3-4 sitz baths a day, or as many as told by your health care provider. Soak in the water for 15-20 minutes.  Do not continue with sitz baths if your symptoms get worse. This information is not intended to replace advice given to you by your health care provider. Make sure you discuss any questions you have with your health care provider. Document Released: 09/03/2004 Document Revised: 12/14/2017 Document Reviewed: 12/14/2017 Elsevier Patient Education  [employer].

## 2019-07-17 ENCOUNTER — Encounter (HOSPITAL_COMMUNITY): Payer: Self-pay | Admitting: General Surgery

## 2019-07-17 NOTE — Addendum Note (Signed)
Addendum  created 07/17/19 1142 by Vista Deck, CRNA   Intraprocedure Event edited, Intraprocedure Staff edited

## 2019-07-23 ENCOUNTER — Encounter: Payer: Self-pay | Admitting: General Surgery

## 2019-07-23 ENCOUNTER — Ambulatory Visit (INDEPENDENT_AMBULATORY_CARE_PROVIDER_SITE_OTHER): Payer: Self-pay | Admitting: General Surgery

## 2019-07-23 ENCOUNTER — Other Ambulatory Visit: Payer: Self-pay

## 2019-07-23 VITALS — BP 179/118 | HR 97 | Temp 96.8°F | Resp 16 | Ht 72.0 in | Wt 283.0 lb

## 2019-07-23 DIAGNOSIS — Z09 Encounter for follow-up examination after completed treatment for conditions other than malignant neoplasm: Secondary | ICD-10-CM

## 2019-07-23 NOTE — Progress Notes (Signed)
Subjective:     Thomas Donovan  Here for postoperative visit.  Patient denies any purulent drainage.  He is still having some burning sensation at the incision site. Objective:    BP (!) 179/118 (BP Location: Left Arm, Patient Position: Sitting, Cuff Size: Normal)   Pulse 97   Temp (!) 96.8 F (36 C) (Tympanic)   Resp 16   Ht 6' (1.829 m)   Wt 283 lb (128.4 kg)   SpO2 97%   BMI 38.38 kg/m   General:  alert, cooperative and no distress  Perineal wound healing well by secondary intention.  Good granulation tissue noted at base.  No purulent drainage present.     Assessment:    Doing well postoperatively.    Plan:   Since the patient had received a significant portion of narcotics from his pain clinic provider, I told him I could not prescribe any more pain medication.  We will see patient again in 2 weeks for follow-up.  He should keep the wound clean and dry.

## 2019-08-06 ENCOUNTER — Ambulatory Visit: Payer: Medicare Other | Admitting: General Surgery

## 2021-02-07 ENCOUNTER — Emergency Department (HOSPITAL_COMMUNITY)
Admission: EM | Admit: 2021-02-07 | Discharge: 2021-02-07 | Disposition: A | Discharge status: Home / Self Care | Payer: Medicare Other | Attending: Emergency Medicine | Admitting: Emergency Medicine

## 2021-02-07 ENCOUNTER — Encounter (HOSPITAL_COMMUNITY): Payer: Self-pay | Admitting: Emergency Medicine

## 2021-02-07 ENCOUNTER — Other Ambulatory Visit: Payer: Self-pay

## 2021-02-07 DIAGNOSIS — E119 Type 2 diabetes mellitus without complications: Secondary | ICD-10-CM | POA: Diagnosis not present

## 2021-02-07 DIAGNOSIS — Z7984 Long term (current) use of oral hypoglycemic drugs: Secondary | ICD-10-CM | POA: Insufficient documentation

## 2021-02-07 DIAGNOSIS — Z79899 Other long term (current) drug therapy: Secondary | ICD-10-CM | POA: Insufficient documentation

## 2021-02-07 DIAGNOSIS — I10 Essential (primary) hypertension: Secondary | ICD-10-CM | POA: Insufficient documentation

## 2021-02-07 DIAGNOSIS — I2510 Atherosclerotic heart disease of native coronary artery without angina pectoris: Secondary | ICD-10-CM | POA: Diagnosis not present

## 2021-02-07 DIAGNOSIS — M5432 Sciatica, left side: Secondary | ICD-10-CM | POA: Diagnosis not present

## 2021-02-07 DIAGNOSIS — M545 Low back pain, unspecified: Secondary | ICD-10-CM | POA: Diagnosis present

## 2021-02-07 MED ORDER — PREDNISONE 10 MG PO TABS: ORAL_TABLET | ORAL | 0 refills | Status: DC

## 2021-02-07 MED ORDER — DEXAMETHASONE SODIUM PHOSPHATE 10 MG/ML IJ SOLN
10.0000 mg | Freq: Once | INTRAMUSCULAR | Status: AC
  Administered 2021-02-07: 10 mg via INTRAMUSCULAR
  Filled 2021-02-07: qty 1

## 2021-02-07 NOTE — Discharge Instructions (Addendum)
Use heat on the sore area 3-4 times a day.  See your doctor for follow-up care as needed.

## 2021-02-07 NOTE — ED Provider Notes (Signed)
[facility] EMERGENCY DEPARTMENT Provider Note   CSN: [id number] Arrival date & time: 02/07/21  1119     History Chief Complaint  Patient presents with  . Back Pain    Thomas Donovan is a 60 y.o. male.  HPI He presents for evaluation of left-sided "sciatica," pain for 2 weeks, without trauma. he is taking his usual chronic pain medicine, without relief.  No recent trauma.  He is attempting to schedule an appointment with a rehab doctor for a injection of his back.  He denies loss of function of bowel or bladder.  He states the pain radiates from his left lower back to the toes of his left foot.  He continues to be able to ambulate.  There are no other known modifying factors    Past Medical History:  Diagnosis Date  . Aortic aneurysm (HCC) 2019  . Chronic back pain   . Cold    recent rx  . Coronary artery disease   . Degeneration of lumbar intervertebral disc   . Diabetes mellitus   . GERD (gastroesophageal reflux disease)   . Gout   . History of kidney stones   . Hypertension   . Pneumonia     Patient Active Problem List   Diagnosis Date Noted  . Abscess of perineum   . Tobacco use 08/22/2017  . Hypokalemia 08/22/2017  . GERD (gastroesophageal reflux disease) 08/22/2017  . Type 2 diabetes mellitus with other specified complication (HCC) 08/22/2017  . Syncope 08/21/2017  . Chest pain 08/21/2017  . Dyspnea 08/21/2017  . Hematochezia 08/21/2017  . 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 BACK SURGERY    . IRRIGATION AND DEBRIDEMENT ABSCESS N/A 07/15/2019   Procedure: IRRIGATION AND DRAINAGE OF PERINEAL ABSCESS;  Surgeon: Franky Macho, MD;  Location: [facility];  Service: General;  Laterality: N/A;  . MAXIMUM ACCESS (MAS)POSTERIOR LUMBAR INTERBODY FUSION (PLIF) 1 LEVEL  11/20/2013   Procedure: FOR MAXIMUM ACCESS (MAS) POSTERIOR LUMBAR INTERBODY FUSION LUMBAR FIVE-SACRAL ONE;  Surgeon: Tia Alert, MD;  Location:  [facility];  Service: Neurosurgery;;  FOR MAXIMUM ACCESS (MAS) POSTERIOR LUMBAR INTERBODY FUSION LUMBAR FIVE-SACRAL ONE  . TONSILLECTOMY         History reviewed. No pertinent family history.  Social History   Tobacco Use  . Smoking status: Current Every Day Smoker    Packs/day: 0.50    Years: 10.00    Pack years: 5.00    Types: Cigarettes  . Smokeless tobacco: Never Used  Vaping Use  . Vaping Use: Never used  Substance Use Topics  . Alcohol use: Yes    Alcohol/week: 12.0 standard drinks    Types: 12 Cans of beer per week    Comment: every 2 or 3 days  . Drug use: No    Home Medications Prior to Admission medications   Medication Sig Start Date End Date Taking? Authorizing Provider  albuterol (VENTOLIN HFA) 108 (90 Base) MCG/ACT inhaler Inhale 2 puffs into the lungs every 6 (six) hours as needed for pain. 06/21/19   [provider]  colchicine 0.6 MG tablet Take 1-2 tablets (0.6-1.2 mg total) by mouth 2 (two) times daily as needed (for gout flares). 08/23/17   Standley Brooking, MD  cyclobenzaprine (FLEXERIL) 10 MG tablet Take 1 tablet (10 mg total) by mouth 3 (three) times daily. 07/22/18   Dione Booze, MD  gabapentin (NEURONTIN) 800 MG tablet Take 800 mg by  mouth 4 (four) times daily. 02/23/18   [provider]  glipiZIDE (GLUCOTROL XL) 5 MG 24 hr tablet Take 5 mg by mouth daily with breakfast.    [provider]  losartan (COZAAR) 50 MG tablet Take 50 mg by mouth daily.    [provider]  metFORMIN (GLUCOPHAGE-XR) 500 MG 24 hr tablet Take 1-2 tablets (500-1,000 mg total) by mouth 2 (two) times daily. Pt takes two tablets in the morning and one at bedtime. Patient taking differently: Take 500 mg by mouth 3 (three) times daily.  08/23/17   Standley Brooking, MD  Omeprazole 20 MG TBEC Take 20 mg by mouth 2 (two) times a day.  11/25/17   [provider]  predniSONE (DELTASONE) 10 MG tablet Take 6 tablets day one, 5 tablets day two, 4  tablets day three, 3 tablets day four, 2 tablets day five, then 1 tablet day six 08/01/18   Triplett, Tammy, PA-C  sulfamethoxazole-trimethoprim (BACTRIM DS) 800-160 MG tablet Take 1 tablet by mouth 2 (two) times daily. 06/25/19   Franky Macho, MD  tamsulosin (FLOMAX) 0.4 MG CAPS capsule Take 1 capsule (0.4 mg total) by mouth daily. 07/22/18   Dione Booze, MD    Allergies    Celebrex [celecoxib], Aspirin, Ibuprofen, Toradol [ketorolac tromethamine], and Tramadol  Review of Systems   Review of Systems  All other systems reviewed and are negative.   Physical Exam Updated Vital Signs BP (!) 166/86 (BP Location: Left Arm)   Pulse 83   Temp 98.6 F (37 C) (Oral)   Resp 18   Ht 6' (1.829 m)   Wt 127 kg   SpO2 95%   BMI 37.97 kg/m   Physical Exam Vitals and nursing note reviewed.  Constitutional:      General: He is not in acute distress.    Appearance: He is well-developed and well-nourished. He is obese. He is not ill-appearing, toxic-appearing or diaphoretic.  HENT:     Head: Normocephalic and atraumatic.     Right Ear: External ear normal.     Left Ear: External ear normal.  Eyes:     Extraocular Movements: EOM normal.     Conjunctiva/sclera: Conjunctivae normal.     Pupils: Pupils are equal, round, and reactive to light.  Neck:     Trachea: Phonation normal.  Cardiovascular:     Rate and Rhythm: Normal rate.  Pulmonary:     Effort: Pulmonary effort is normal.  Chest:     Chest wall: No bony tenderness.  Abdominal:     General: There is no distension.  Musculoskeletal:        General: Normal range of motion.     Cervical back: Normal range of motion and neck supple.     Comments: Negative straight leg raising bilaterally.  Skin:    General: Skin is warm, dry and intact.  Neurological:     Mental Status: He is alert and oriented to person, place, and time.     Cranial Nerves: No cranial nerve deficit.     Sensory: No sensory deficit.     Motor: No abnormal muscle  tone.     Coordination: Coordination normal.  Psychiatric:        Mood and Affect: Mood and affect and mood normal.        Behavior: Behavior normal.        Thought Content: Thought content normal.        Judgment: Judgment normal.  ED Results / Procedures / Treatments   Labs (all labs ordered are listed, but only abnormal results are displayed) Labs Reviewed - No data to display  EKG None  Radiology No results found.  Procedures Procedures   Medications Ordered in ED Medications  dexamethasone (DECADRON) injection 10 mg (has no administration in time range)    ED Course  I have reviewed the triage vital signs and the nursing notes.  Pertinent labs & imaging results that were available during my care of the patient were reviewed by me and considered in my medical decision making (see chart for details).    MDM Rules/Calculators/A&P                           Patient Vitals for the past 24 hrs:  BP Temp Temp src Pulse Resp SpO2 Height Weight  02/07/21 1217 (!) 166/86 98.6 F (37 C) Oral 83 18 95 % -- --  02/07/21 1131 (!) 185/91 97.6 F (36.4 C) Oral 87 18 96 % 6' (1.829 m) 127 kg    12:25 PM Reevaluation with update and discussion. After initial assessment and treatment, an updated evaluation reveals no change in clinical status, findings discussed with the patient and all questions were answered. Mancel Bale   Medical Decision Making:  This patient is presenting for evaluation of sciatica, which does not require a range of treatment options, and is not a complaint that involves a high risk of morbidity and mortality. The differential diagnoses include muscle strain, radiculopathy, sciatica. I decided to review old records, and in summary patient with chronic back pain, on high-dose narcotic, presenting with atraumatic sciatic symptoms left side.  No associated central symptoms.  I did not require additional historical information from anyone.    Critical  Interventions-evaluation, IM injection Decadron, discussion with patient  After These Interventions, the Patient was reevaluated and was found stable for discharge.  Left-sided sciatica without complicating features.  Doubt lumbar myelopathy.  CRITICAL CARE-no Performed by: Mancel Bale  Nursing Notes Reviewed/ Care Coordinated Applicable Imaging Reviewed Interpretation of Laboratory Data incorporated into ED treatment  The patient appears reasonably screened and/or stabilized for discharge and I doubt any other medical condition or other [facility] requiring further screening, evaluation, or treatment in the ED at this time prior to discharge.  Plan: Home Medications-continue usual; Home Treatments-heat to affected area; return here if the recommended treatment, does not improve the symptoms; Recommended follow up-routine care with usual providers as needed     Final Clinical Impression(s) / ED Diagnoses Final diagnoses:  Sciatica of left side    Rx / DC Orders ED Discharge Orders    None       Mancel Bale, MD 02/07/21 1230

## 2021-02-07 NOTE — ED Triage Notes (Signed)
Pt c/o LL back pain that is chronic in nature and travels down his left leg.  Pt has not been able to see his specialist due to inclement weather.

## 2021-04-30 ENCOUNTER — Other Ambulatory Visit: Payer: Self-pay

## 2021-04-30 ENCOUNTER — Emergency Department (HOSPITAL_COMMUNITY)
Admission: EM | Admit: 2021-04-30 | Discharge: 2021-04-30 | Discharge status: Against Medical Advice | Payer: Medicare Other

## 2023-09-17 ENCOUNTER — Encounter (HOSPITAL_COMMUNITY): Payer: Self-pay

## 2023-09-17 ENCOUNTER — Inpatient Hospital Stay (HOSPITAL_COMMUNITY)
Admission: EM | Admit: 2023-09-17 | Discharge: 2023-09-21 | DRG: 378 | Discharge status: Against Medical Advice | Payer: Medicare HMO | Attending: Family Medicine | Admitting: Family Medicine

## 2023-09-17 ENCOUNTER — Emergency Department (HOSPITAL_COMMUNITY): Payer: Medicare HMO

## 2023-09-17 ENCOUNTER — Other Ambulatory Visit: Payer: Self-pay

## 2023-09-17 DIAGNOSIS — D649 Anemia, unspecified: Secondary | ICD-10-CM | POA: Diagnosis not present

## 2023-09-17 DIAGNOSIS — Z87442 Personal history of urinary calculi: Secondary | ICD-10-CM

## 2023-09-17 DIAGNOSIS — F419 Anxiety disorder, unspecified: Secondary | ICD-10-CM | POA: Diagnosis present

## 2023-09-17 DIAGNOSIS — Z6839 Body mass index (BMI) 39.0-39.9, adult: Secondary | ICD-10-CM

## 2023-09-17 DIAGNOSIS — N401 Enlarged prostate with lower urinary tract symptoms: Secondary | ICD-10-CM | POA: Diagnosis present

## 2023-09-17 DIAGNOSIS — K760 Fatty (change of) liver, not elsewhere classified: Secondary | ICD-10-CM | POA: Diagnosis present

## 2023-09-17 DIAGNOSIS — K922 Gastrointestinal hemorrhage, unspecified: Secondary | ICD-10-CM | POA: Insufficient documentation

## 2023-09-17 DIAGNOSIS — K921 Melena: Secondary | ICD-10-CM | POA: Diagnosis not present

## 2023-09-17 DIAGNOSIS — Z634 Disappearance and death of family member: Secondary | ICD-10-CM

## 2023-09-17 DIAGNOSIS — Z7901 Long term (current) use of anticoagulants: Secondary | ICD-10-CM

## 2023-09-17 DIAGNOSIS — G4733 Obstructive sleep apnea (adult) (pediatric): Secondary | ICD-10-CM | POA: Diagnosis present

## 2023-09-17 DIAGNOSIS — F10920 Alcohol use, unspecified with intoxication, uncomplicated: Secondary | ICD-10-CM

## 2023-09-17 DIAGNOSIS — G8929 Other chronic pain: Secondary | ICD-10-CM | POA: Diagnosis present

## 2023-09-17 DIAGNOSIS — J449 Chronic obstructive pulmonary disease, unspecified: Secondary | ICD-10-CM | POA: Diagnosis present

## 2023-09-17 DIAGNOSIS — F1090 Alcohol use, unspecified, uncomplicated: Secondary | ICD-10-CM | POA: Insufficient documentation

## 2023-09-17 DIAGNOSIS — I719 Aortic aneurysm of unspecified site, without rupture: Secondary | ICD-10-CM | POA: Diagnosis present

## 2023-09-17 DIAGNOSIS — N1831 Chronic kidney disease, stage 3a: Secondary | ICD-10-CM | POA: Insufficient documentation

## 2023-09-17 DIAGNOSIS — D538 Other specified nutritional anemias: Secondary | ICD-10-CM | POA: Diagnosis present

## 2023-09-17 DIAGNOSIS — Z91148 Patient's other noncompliance with medication regimen for other reason: Secondary | ICD-10-CM

## 2023-09-17 DIAGNOSIS — Z885 Allergy status to narcotic agent status: Secondary | ICD-10-CM

## 2023-09-17 DIAGNOSIS — E1165 Type 2 diabetes mellitus with hyperglycemia: Secondary | ICD-10-CM

## 2023-09-17 DIAGNOSIS — R000 Tachycardia, unspecified: Secondary | ICD-10-CM | POA: Diagnosis present

## 2023-09-17 DIAGNOSIS — Z91199 Patient's noncompliance with other medical treatment and regimen due to unspecified reason: Secondary | ICD-10-CM

## 2023-09-17 DIAGNOSIS — I130 Hypertensive heart and chronic kidney disease with heart failure and stage 1 through stage 4 chronic kidney disease, or unspecified chronic kidney disease: Secondary | ICD-10-CM | POA: Diagnosis present

## 2023-09-17 DIAGNOSIS — T39395A Adverse effect of other nonsteroidal anti-inflammatory drugs [NSAID], initial encounter: Secondary | ICD-10-CM | POA: Diagnosis present

## 2023-09-17 DIAGNOSIS — Z79899 Other long term (current) drug therapy: Secondary | ICD-10-CM

## 2023-09-17 DIAGNOSIS — E8809 Other disorders of plasma-protein metabolism, not elsewhere classified: Secondary | ICD-10-CM | POA: Diagnosis present

## 2023-09-17 DIAGNOSIS — Z888 Allergy status to other drugs, medicaments and biological substances status: Secondary | ICD-10-CM

## 2023-09-17 DIAGNOSIS — E785 Hyperlipidemia, unspecified: Secondary | ICD-10-CM | POA: Diagnosis present

## 2023-09-17 DIAGNOSIS — K219 Gastro-esophageal reflux disease without esophagitis: Secondary | ICD-10-CM | POA: Diagnosis present

## 2023-09-17 DIAGNOSIS — R451 Restlessness and agitation: Secondary | ICD-10-CM | POA: Diagnosis not present

## 2023-09-17 DIAGNOSIS — I4819 Other persistent atrial fibrillation: Secondary | ICD-10-CM | POA: Diagnosis present

## 2023-09-17 DIAGNOSIS — I2510 Atherosclerotic heart disease of native coronary artery without angina pectoris: Secondary | ICD-10-CM | POA: Diagnosis present

## 2023-09-17 DIAGNOSIS — Z5900 Homelessness unspecified: Secondary | ICD-10-CM

## 2023-09-17 DIAGNOSIS — T8092XA Unspecified transfusion reaction, initial encounter: Secondary | ICD-10-CM | POA: Diagnosis not present

## 2023-09-17 DIAGNOSIS — D6832 Hemorrhagic disorder due to extrinsic circulating anticoagulants: Secondary | ICD-10-CM | POA: Diagnosis present

## 2023-09-17 DIAGNOSIS — I4891 Unspecified atrial fibrillation: Secondary | ICD-10-CM

## 2023-09-17 DIAGNOSIS — N400 Enlarged prostate without lower urinary tract symptoms: Secondary | ICD-10-CM | POA: Insufficient documentation

## 2023-09-17 DIAGNOSIS — R0789 Other chest pain: Secondary | ICD-10-CM | POA: Diagnosis present

## 2023-09-17 DIAGNOSIS — R079 Chest pain, unspecified: Secondary | ICD-10-CM | POA: Diagnosis present

## 2023-09-17 DIAGNOSIS — Z7984 Long term (current) use of oral hypoglycemic drugs: Secondary | ICD-10-CM

## 2023-09-17 DIAGNOSIS — F1410 Cocaine abuse, uncomplicated: Secondary | ICD-10-CM | POA: Insufficient documentation

## 2023-09-17 DIAGNOSIS — D62 Acute posthemorrhagic anemia: Secondary | ICD-10-CM | POA: Insufficient documentation

## 2023-09-17 DIAGNOSIS — Z794 Long term (current) use of insulin: Secondary | ICD-10-CM

## 2023-09-17 DIAGNOSIS — Z5986 Financial insecurity: Secondary | ICD-10-CM

## 2023-09-17 DIAGNOSIS — Y906 Blood alcohol level of 120-199 mg/100 ml: Secondary | ICD-10-CM | POA: Diagnosis present

## 2023-09-17 DIAGNOSIS — Z86010 Personal history of colonic polyps: Secondary | ICD-10-CM

## 2023-09-17 DIAGNOSIS — R195 Other fecal abnormalities: Secondary | ICD-10-CM

## 2023-09-17 DIAGNOSIS — T45515A Adverse effect of anticoagulants, initial encounter: Secondary | ICD-10-CM | POA: Diagnosis present

## 2023-09-17 DIAGNOSIS — R072 Precordial pain: Secondary | ICD-10-CM | POA: Diagnosis present

## 2023-09-17 DIAGNOSIS — E669 Obesity, unspecified: Secondary | ICD-10-CM | POA: Diagnosis present

## 2023-09-17 DIAGNOSIS — I482 Chronic atrial fibrillation, unspecified: Secondary | ICD-10-CM | POA: Insufficient documentation

## 2023-09-17 DIAGNOSIS — E1122 Type 2 diabetes mellitus with diabetic chronic kidney disease: Secondary | ICD-10-CM | POA: Diagnosis present

## 2023-09-17 DIAGNOSIS — Z886 Allergy status to analgesic agent status: Secondary | ICD-10-CM

## 2023-09-17 DIAGNOSIS — M10272 Drug-induced gout, left ankle and foot: Secondary | ICD-10-CM

## 2023-09-17 DIAGNOSIS — M549 Dorsalgia, unspecified: Secondary | ICD-10-CM | POA: Diagnosis present

## 2023-09-17 DIAGNOSIS — D509 Iron deficiency anemia, unspecified: Secondary | ICD-10-CM | POA: Diagnosis present

## 2023-09-17 DIAGNOSIS — R319 Hematuria, unspecified: Secondary | ICD-10-CM | POA: Insufficient documentation

## 2023-09-17 DIAGNOSIS — K7030 Alcoholic cirrhosis of liver without ascites: Secondary | ICD-10-CM | POA: Diagnosis present

## 2023-09-17 DIAGNOSIS — N289 Disorder of kidney and ureter, unspecified: Secondary | ICD-10-CM

## 2023-09-17 DIAGNOSIS — F17210 Nicotine dependence, cigarettes, uncomplicated: Secondary | ICD-10-CM | POA: Diagnosis present

## 2023-09-17 DIAGNOSIS — E871 Hypo-osmolality and hyponatremia: Secondary | ICD-10-CM | POA: Diagnosis present

## 2023-09-17 DIAGNOSIS — F10120 Alcohol abuse with intoxication, uncomplicated: Secondary | ICD-10-CM | POA: Diagnosis present

## 2023-09-17 DIAGNOSIS — T501X5A Adverse effect of loop [high-ceiling] diuretics, initial encounter: Secondary | ICD-10-CM | POA: Diagnosis present

## 2023-09-17 DIAGNOSIS — I5032 Chronic diastolic (congestive) heart failure: Secondary | ICD-10-CM | POA: Diagnosis present

## 2023-09-17 DIAGNOSIS — Z7951 Long term (current) use of inhaled steroids: Secondary | ICD-10-CM

## 2023-09-17 NOTE — ED Notes (Signed)
Urine sample in room if needed.

## 2023-09-17 NOTE — ED Provider Notes (Signed)
[place] EMERGENCY DEPARTMENT AT [facility] Provider Note   CSN: [id number] Arrival date & time: 09/17/23  2254     History {Add pertinent medical, surgical, social history, OB history to HPI:1} Chief Complaint  Patient presents with   Chest Pain    Thomas Donovan is a 62 y.o. male.  The history is provided by the patient.  Chest Pain He has history of hypertension, diabetes, hyperlipidemia, COPD, atrial fibrillation anticoagulated on apixaban and comes in because of episodes where he has near syncope when he stands up and walks a short distance.  This has been going on about 6-8 months, and is generally getting worse.  He describes a feeling like a balloon being blown up inside his chest.  He endorses shortness of breath, nausea, diaphoresis.  He does admit to using cocaine tonight but he states that that was a one-time event.  He denies ethanol and other drug use.  He states he has been compliant with his cardiac medications including apixaban.  However, he states that he is homeless and that he needs to be admitted to the hospital.   Home Medications Prior to Admission medications   Medication Sig Start Date End Date Taking? Authorizing Provider  albuterol (VENTOLIN HFA) 108 (90 Base) MCG/ACT inhaler Inhale 2 puffs into the lungs every 6 (six) hours as needed for pain. 06/21/19   [provider]  colchicine 0.6 MG tablet Take 1-2 tablets (0.6-1.2 mg total) by mouth 2 (two) times daily as needed (for gout flares). 08/23/17   Standley Brooking, MD  cyclobenzaprine (FLEXERIL) 10 MG tablet Take 1 tablet (10 mg total) by mouth 3 (three) times daily. 07/22/18   Dione Booze, MD  gabapentin (NEURONTIN) 800 MG tablet Take 800 mg by mouth 4 (four) times daily. 02/23/18   [provider]  glipiZIDE (GLUCOTROL XL) 5 MG 24 hr tablet Take 5 mg by mouth daily with breakfast.    [provider]  losartan (COZAAR) 50 MG tablet Take 50 mg by mouth daily.     [provider]  metFORMIN (GLUCOPHAGE-XR) 500 MG 24 hr tablet Take 1-2 tablets (500-1,000 mg total) by mouth 2 (two) times daily. Pt takes two tablets in the morning and one at bedtime. Patient taking differently: Take 500 mg by mouth 3 (three) times daily.  08/23/17   Standley Brooking, MD  Omeprazole 20 MG TBEC Take 20 mg by mouth 2 (two) times a day.  11/25/17   [provider]  predniSONE (DELTASONE) 10 MG tablet TAKE Q DAY 6,5,4,3,2,1 02/07/21   Mancel Bale, MD  sulfamethoxazole-trimethoprim (BACTRIM DS) 800-160 MG tablet Take 1 tablet by mouth 2 (two) times daily. 06/25/19   Franky Macho, MD  tamsulosin (FLOMAX) 0.4 MG CAPS capsule Take 1 capsule (0.4 mg total) by mouth daily. 07/22/18   Dione Booze, MD      Allergies    Celebrex [celecoxib], Aspirin, Ibuprofen, Toradol [ketorolac tromethamine], and Tramadol    Review of Systems   Review of Systems  Cardiovascular:  Positive for chest pain.  All other systems reviewed and are negative.   Physical Exam Updated Vital Signs BP 130/78 (BP Location: Right Arm)   Pulse (!) 110   Temp 98.2 F (36.8 C) (Oral)   Resp 18   Ht 6\' 1"  (1.854 m)   Wt 127 kg   SpO2 97%   BMI 36.94 kg/m  Physical Exam Vitals and nursing note reviewed.   .62 year old male, resting  comfortably and in no acute distress. Vital signs are significant for elevated heart rate. Oxygen saturation is 97%, which is normal. Head is normocephalic and atraumatic. PERRLA, EOMI. Neck is nontender and supple without adenopathy or JVD. Back is nontender and there is no CVA tenderness. Lungs are clear without rales, wheezes, or rhonchi. Chest is nontender. Heart has an irregular rhythm without murmur. Abdomen is soft, flat, nontender. Extremities have 1+ edema. Skin is warm and dry without rash. Neurologic: Mental status is normal, cranial nerves are intact, moves all extremities equally.  ED Results / Procedures / Treatments   Labs (all labs  ordered are listed, but only abnormal results are displayed) Labs Reviewed - No data to display  EKG EKG Interpretation Date/Time:  Sunday September 17 2023 23:10:02 EDT Ventricular Rate:  102 PR Interval:    QRS Duration:  91 QT Interval:  367 QTC Calculation: 479 R Axis:   34  Text Interpretation: Atrial fibrillation Low voltage, precordial leads Minimal ST depression, inferior leads Borderline prolonged QT interval When compared with ECG of 07/03/2018, Atrial fibrillation has replaced Sinus rhythm Confirmed by Dione Booze ([id number]) on 09/17/2023 11:14:03 PM  Radiology No results found.  Procedures Procedures  Cardiac monitor shows atrial fibrillation, per my interpretation.  Medications Ordered in ED Medications - No data to display  ED Course/ Medical Decision Making/ A&P   {   Click here for ABCD2, HEART and other calculatorsREFRESH Note before signing :1}                              Medical Decision Making  Orthostatic near-syncope which is chronic.  Cocaine abuse.  Atrial fibrillation with appropriate anticoagulation.  I have reviewed his past records, and his last cardiology office visit was on 08/17/2021 at which time he was supposed to have a return visit in 3 months.  On 08/17/2021, he had a nuclear medicine myocardial perfusion study which was a low risk study.  On/08/2023 he had an echocardiogram which was a technically difficult study but had normal ejection fraction with indeterminate diastolic parameters.  I have reviewed his electrocardiogram, and my interpretation is atrial fibrillation with nonspecific ST changes, with rhythm change compared with old ECG but no change in QRS morphology or ST segments.  I have ordered orthostatic vital signs, CBC, comprehensive metabolic panel, ethanol level, troponin x 2.  At this point, I do not see indications for hospitalization.  {Document critical care time when appropriate:1} {Document review of labs and clinical decision tools  ie heart score, Chads2Vasc2 etc:1}  {Document your independent review of radiology images, and any outside records:1} {Document your discussion with family members, caretakers, and with consultants:1} {Document social determinants of health affecting pt's care:1} {Document your decision making why or why not admission, treatments were needed:1} Final Clinical Impression(s) / ED Diagnoses Final diagnoses:  None    Rx / DC Orders ED Discharge Orders     None

## 2023-09-17 NOTE — ED Triage Notes (Signed)
Complaining of chest pain that started 5 days ago. Center of chest. He is also complaining of being dizzy when he stands up which started 6 months ago. Admits to drinking alcohol tonight.

## 2023-09-18 ENCOUNTER — Inpatient Hospital Stay (HOSPITAL_COMMUNITY): Payer: Medicare HMO

## 2023-09-18 ENCOUNTER — Encounter (HOSPITAL_COMMUNITY): Payer: Self-pay | Admitting: Internal Medicine

## 2023-09-18 ENCOUNTER — Other Ambulatory Visit (HOSPITAL_COMMUNITY): Payer: Self-pay | Admitting: *Deleted

## 2023-09-18 ENCOUNTER — Observation Stay (HOSPITAL_COMMUNITY): Payer: Medicare HMO

## 2023-09-18 DIAGNOSIS — E871 Hypo-osmolality and hyponatremia: Secondary | ICD-10-CM | POA: Diagnosis present

## 2023-09-18 DIAGNOSIS — N1831 Chronic kidney disease, stage 3a: Secondary | ICD-10-CM | POA: Diagnosis present

## 2023-09-18 DIAGNOSIS — E1165 Type 2 diabetes mellitus with hyperglycemia: Secondary | ICD-10-CM | POA: Diagnosis present

## 2023-09-18 DIAGNOSIS — E669 Obesity, unspecified: Secondary | ICD-10-CM | POA: Diagnosis present

## 2023-09-18 DIAGNOSIS — I130 Hypertensive heart and chronic kidney disease with heart failure and stage 1 through stage 4 chronic kidney disease, or unspecified chronic kidney disease: Secondary | ICD-10-CM | POA: Diagnosis present

## 2023-09-18 DIAGNOSIS — G8929 Other chronic pain: Secondary | ICD-10-CM | POA: Diagnosis present

## 2023-09-18 DIAGNOSIS — J449 Chronic obstructive pulmonary disease, unspecified: Secondary | ICD-10-CM | POA: Diagnosis present

## 2023-09-18 DIAGNOSIS — E1122 Type 2 diabetes mellitus with diabetic chronic kidney disease: Secondary | ICD-10-CM | POA: Diagnosis present

## 2023-09-18 DIAGNOSIS — E8809 Other disorders of plasma-protein metabolism, not elsewhere classified: Secondary | ICD-10-CM | POA: Diagnosis present

## 2023-09-18 DIAGNOSIS — T8092XA Unspecified transfusion reaction, initial encounter: Secondary | ICD-10-CM

## 2023-09-18 DIAGNOSIS — R079 Chest pain, unspecified: Secondary | ICD-10-CM

## 2023-09-18 DIAGNOSIS — K921 Melena: Secondary | ICD-10-CM | POA: Diagnosis present

## 2023-09-18 DIAGNOSIS — F1090 Alcohol use, unspecified, uncomplicated: Secondary | ICD-10-CM | POA: Insufficient documentation

## 2023-09-18 DIAGNOSIS — R319 Hematuria, unspecified: Secondary | ICD-10-CM | POA: Insufficient documentation

## 2023-09-18 DIAGNOSIS — D538 Other specified nutritional anemias: Secondary | ICD-10-CM | POA: Diagnosis present

## 2023-09-18 DIAGNOSIS — Z7901 Long term (current) use of anticoagulants: Secondary | ICD-10-CM

## 2023-09-18 DIAGNOSIS — Z5900 Homelessness unspecified: Secondary | ICD-10-CM | POA: Diagnosis not present

## 2023-09-18 DIAGNOSIS — F1410 Cocaine abuse, uncomplicated: Secondary | ICD-10-CM | POA: Insufficient documentation

## 2023-09-18 DIAGNOSIS — F17210 Nicotine dependence, cigarettes, uncomplicated: Secondary | ICD-10-CM | POA: Diagnosis present

## 2023-09-18 DIAGNOSIS — I4891 Unspecified atrial fibrillation: Secondary | ICD-10-CM | POA: Diagnosis not present

## 2023-09-18 DIAGNOSIS — Z794 Long term (current) use of insulin: Secondary | ICD-10-CM | POA: Diagnosis not present

## 2023-09-18 DIAGNOSIS — D62 Acute posthemorrhagic anemia: Secondary | ICD-10-CM | POA: Insufficient documentation

## 2023-09-18 DIAGNOSIS — E785 Hyperlipidemia, unspecified: Secondary | ICD-10-CM | POA: Diagnosis present

## 2023-09-18 DIAGNOSIS — R195 Other fecal abnormalities: Secondary | ICD-10-CM | POA: Diagnosis not present

## 2023-09-18 DIAGNOSIS — I482 Chronic atrial fibrillation, unspecified: Secondary | ICD-10-CM | POA: Diagnosis not present

## 2023-09-18 DIAGNOSIS — D649 Anemia, unspecified: Principal | ICD-10-CM | POA: Diagnosis present

## 2023-09-18 DIAGNOSIS — Y906 Blood alcohol level of 120-199 mg/100 ml: Secondary | ICD-10-CM | POA: Diagnosis present

## 2023-09-18 DIAGNOSIS — I719 Aortic aneurysm of unspecified site, without rupture: Secondary | ICD-10-CM | POA: Diagnosis present

## 2023-09-18 DIAGNOSIS — F10120 Alcohol abuse with intoxication, uncomplicated: Secondary | ICD-10-CM | POA: Diagnosis present

## 2023-09-18 DIAGNOSIS — K7030 Alcoholic cirrhosis of liver without ascites: Secondary | ICD-10-CM | POA: Diagnosis present

## 2023-09-18 DIAGNOSIS — I5033 Acute on chronic diastolic (congestive) heart failure: Secondary | ICD-10-CM | POA: Diagnosis not present

## 2023-09-18 DIAGNOSIS — K219 Gastro-esophageal reflux disease without esophagitis: Secondary | ICD-10-CM

## 2023-09-18 DIAGNOSIS — N400 Enlarged prostate without lower urinary tract symptoms: Secondary | ICD-10-CM | POA: Insufficient documentation

## 2023-09-18 DIAGNOSIS — R0789 Other chest pain: Secondary | ICD-10-CM

## 2023-09-18 DIAGNOSIS — F419 Anxiety disorder, unspecified: Secondary | ICD-10-CM | POA: Diagnosis present

## 2023-09-18 DIAGNOSIS — D6832 Hemorrhagic disorder due to extrinsic circulating anticoagulants: Secondary | ICD-10-CM | POA: Diagnosis present

## 2023-09-18 DIAGNOSIS — I5032 Chronic diastolic (congestive) heart failure: Secondary | ICD-10-CM | POA: Diagnosis present

## 2023-09-18 DIAGNOSIS — I4819 Other persistent atrial fibrillation: Secondary | ICD-10-CM | POA: Diagnosis present

## 2023-09-18 DIAGNOSIS — K922 Gastrointestinal hemorrhage, unspecified: Secondary | ICD-10-CM | POA: Diagnosis not present

## 2023-09-18 DIAGNOSIS — E46 Unspecified protein-calorie malnutrition: Secondary | ICD-10-CM

## 2023-09-18 LAB — URINALYSIS, ROUTINE W REFLEX MICROSCOPIC
Bacteria, UA: NONE SEEN
Bilirubin Urine: NEGATIVE
Glucose, UA: 50 mg/dL — AB
Hgb urine dipstick: NEGATIVE
Ketones, ur: NEGATIVE mg/dL
Leukocytes,Ua: NEGATIVE
Nitrite: NEGATIVE
Protein, ur: 100 mg/dL — AB
Specific Gravity, Urine: 1.014 (ref 1.005–1.030)
pH: 6 (ref 5.0–8.0)

## 2023-09-18 LAB — COMPREHENSIVE METABOLIC PANEL
ALT: 14 U/L (ref 0–44)
ALT: 14 U/L (ref 0–44)
AST: 18 U/L (ref 15–41)
AST: 17 U/L (ref 15–41)
Albumin: 3.3 g/dL — ABNORMAL LOW (ref 3.5–5.0)
Albumin: 3.3 g/dL — ABNORMAL LOW (ref 3.5–5.0)
Alkaline Phosphatase: 76 U/L (ref 38–126)
Alkaline Phosphatase: 80 U/L (ref 38–126)
Anion gap: 13 (ref 5–15)
Anion gap: 11 (ref 5–15)
BUN: 17 mg/dL (ref 8–23)
BUN: 18 mg/dL (ref 8–23)
CO2: 21 mmol/L — ABNORMAL LOW (ref 22–32)
CO2: 23 mmol/L (ref 22–32)
Calcium: 8.5 mg/dL — ABNORMAL LOW (ref 8.9–10.3)
Calcium: 8.6 mg/dL — ABNORMAL LOW (ref 8.9–10.3)
Chloride: 99 mmol/L (ref 98–111)
Chloride: 102 mmol/L (ref 98–111)
Creatinine, Ser: 1.65 mg/dL — ABNORMAL HIGH (ref 0.61–1.24)
Creatinine, Ser: 1.63 mg/dL — ABNORMAL HIGH (ref 0.61–1.24)
GFR, Estimated: 47 mL/min — ABNORMAL LOW (ref >60)
GFR, Estimated: 48 mL/min — ABNORMAL LOW (ref >60)
Glucose, Bld: 173 mg/dL — ABNORMAL HIGH (ref 70–99)
Glucose, Bld: 212 mg/dL — ABNORMAL HIGH (ref 70–99)
Potassium: 3.8 mmol/L (ref 3.5–5.1)
Potassium: 4.2 mmol/L (ref 3.5–5.1)
Sodium: 133 mmol/L — ABNORMAL LOW (ref 135–145)
Sodium: 136 mmol/L (ref 135–145)
Total Bilirubin: 0.6 mg/dL (ref 0.3–1.2)
Total Bilirubin: 1.2 mg/dL (ref 0.3–1.2)
Total Protein: 6.1 g/dL — ABNORMAL LOW (ref 6.5–8.1)
Total Protein: 6.3 g/dL — ABNORMAL LOW (ref 6.5–8.1)

## 2023-09-18 LAB — CBC
HCT: 27.7 % — ABNORMAL LOW (ref 39.0–52.0)
Hemoglobin: 8.2 g/dL — ABNORMAL LOW (ref 13.0–17.0)
MCH: 23.2 pg — ABNORMAL LOW (ref 26.0–34.0)
MCHC: 29.6 g/dL — ABNORMAL LOW (ref 30.0–36.0)
MCV: 78.5 fL — ABNORMAL LOW (ref 80.0–100.0)
Platelets: 322 10*3/uL (ref 150–400)
RBC: 3.53 MIL/uL — ABNORMAL LOW (ref 4.22–5.81)
RDW: 19.2 % — ABNORMAL HIGH (ref 11.5–15.5)
WBC: 6 10*3/uL (ref 4.0–10.5)
nRBC: 0 % (ref 0.0–0.2)

## 2023-09-18 LAB — RAPID URINE DRUG SCREEN, HOSP PERFORMED
Amphetamines: NOT DETECTED
Barbiturates: NOT DETECTED
Benzodiazepines: NOT DETECTED
Cocaine: POSITIVE — AB
Opiates: NOT DETECTED
Tetrahydrocannabinol: NOT DETECTED

## 2023-09-18 LAB — TROPONIN I (HIGH SENSITIVITY)
Troponin I (High Sensitivity): 8 ng/L (ref <18)
Troponin I (High Sensitivity): 8 ng/L (ref <18)
Troponin I (High Sensitivity): 9 ng/L (ref <18)
Troponin I (High Sensitivity): 9 ng/L (ref <18)

## 2023-09-18 LAB — ECHOCARDIOGRAM COMPLETE
AR max vel: 3.25 cm2
AV Area VTI: 2.97 cm2
AV Area mean vel: 2.99 cm2
AV Mean grad: 3.1 mmHg
AV Peak grad: 5.8 mmHg
Ao pk vel: 1.21 m/s
Area-P 1/2: 3.53 cm2
Height: 73 in
S' Lateral: 3 cm
Weight: 4750.4 oz

## 2023-09-18 LAB — CBC WITH DIFFERENTIAL/PLATELET
Abs Immature Granulocytes: 0.02 10*3/uL (ref 0.00–0.07)
Basophils Absolute: 0.1 10*3/uL (ref 0.0–0.1)
Basophils Relative: 1 %
Eosinophils Absolute: 0.3 10*3/uL (ref 0.0–0.5)
Eosinophils Relative: 4 %
HCT: 23.5 % — ABNORMAL LOW (ref 39.0–52.0)
Hemoglobin: 6.6 g/dL — CL (ref 13.0–17.0)
Immature Granulocytes: 0 %
Lymphocytes Relative: 24 %
Lymphs Abs: 1.5 10*3/uL (ref 0.7–4.0)
MCH: 21.9 pg — ABNORMAL LOW (ref 26.0–34.0)
MCHC: 28.1 g/dL — ABNORMAL LOW (ref 30.0–36.0)
MCV: 78.1 fL — ABNORMAL LOW (ref 80.0–100.0)
Monocytes Absolute: 0.6 10*3/uL (ref 0.1–1.0)
Monocytes Relative: 10 %
Neutro Abs: 3.8 10*3/uL (ref 1.7–7.7)
Neutrophils Relative %: 61 %
Platelets: 322 10*3/uL (ref 150–400)
RBC: 3.01 MIL/uL — ABNORMAL LOW (ref 4.22–5.81)
RDW: 19.7 % — ABNORMAL HIGH (ref 11.5–15.5)
WBC: 6.2 10*3/uL (ref 4.0–10.5)
nRBC: 0 % (ref 0.0–0.2)

## 2023-09-18 LAB — GLUCOSE, CAPILLARY
Glucose-Capillary: 121 mg/dL — ABNORMAL HIGH (ref 70–99)
Glucose-Capillary: 161 mg/dL — ABNORMAL HIGH (ref 70–99)
Glucose-Capillary: 183 mg/dL — ABNORMAL HIGH (ref 70–99)
Glucose-Capillary: 184 mg/dL — ABNORMAL HIGH (ref 70–99)
Glucose-Capillary: 200 mg/dL — ABNORMAL HIGH (ref 70–99)
Glucose-Capillary: 203 mg/dL — ABNORMAL HIGH (ref 70–99)

## 2023-09-18 LAB — TRANSFUSION REACTION
DAT C3: NEGATIVE
Post RXN DAT IgG: NEGATIVE

## 2023-09-18 LAB — HEMOGLOBIN AND HEMATOCRIT, BLOOD
HCT: 29.1 % — ABNORMAL LOW (ref 39.0–52.0)
Hemoglobin: 8.4 g/dL — ABNORMAL LOW (ref 13.0–17.0)

## 2023-09-18 LAB — PREPARE RBC (CROSSMATCH)

## 2023-09-18 LAB — PHOSPHORUS: Phosphorus: 3.7 mg/dL (ref 2.5–4.6)

## 2023-09-18 LAB — RETICULOCYTES
Immature Retic Fract: 33.9 % — ABNORMAL HIGH (ref 2.3–15.9)
RBC.: 3.05 MIL/uL — ABNORMAL LOW (ref 4.22–5.81)
Retic Count, Absolute: 82.7 10*3/uL (ref 19.0–186.0)
Retic Ct Pct: 2.7 % (ref 0.4–3.1)

## 2023-09-18 LAB — IRON AND TIBC
Iron: 17 ug/dL — ABNORMAL LOW (ref 45–182)
Saturation Ratios: 4 % — ABNORMAL LOW (ref 17.9–39.5)
TIBC: 452 ug/dL — ABNORMAL HIGH (ref 250–450)
UIBC: 435 ug/dL

## 2023-09-18 LAB — ABO/RH: ABO/RH(D): O POS

## 2023-09-18 LAB — MAGNESIUM: Magnesium: 1.8 mg/dL (ref 1.7–2.4)

## 2023-09-18 LAB — FERRITIN: Ferritin: 18 ng/mL — ABNORMAL LOW (ref 24–336)

## 2023-09-18 LAB — FOLATE: Folate: 5 ng/mL — ABNORMAL LOW (ref >5.9)

## 2023-09-18 LAB — HIV ANTIBODY (ROUTINE TESTING W REFLEX): HIV Screen 4th Generation wRfx: NONREACTIVE

## 2023-09-18 LAB — HEMOGLOBIN A1C
Hgb A1c MFr Bld: 6.9 % — ABNORMAL HIGH (ref 4.8–5.6)
Mean Plasma Glucose: 151.33 mg/dL

## 2023-09-18 LAB — VITAMIN B12: Vitamin B-12: 166 pg/mL — ABNORMAL LOW (ref 180–914)

## 2023-09-18 LAB — T4, FREE: Free T4: 0.94 ng/dL (ref 0.61–1.12)

## 2023-09-18 LAB — TSH: TSH: 4.973 u[IU]/mL — ABNORMAL HIGH (ref 0.350–4.500)

## 2023-09-18 LAB — ETHANOL: Alcohol, Ethyl (B): 120 mg/dL — ABNORMAL HIGH (ref <10)

## 2023-09-18 LAB — BRAIN NATRIURETIC PEPTIDE: B Natriuretic Peptide: 158 pg/mL — ABNORMAL HIGH (ref 0.0–100.0)

## 2023-09-18 MED ORDER — ACETAMINOPHEN 650 MG RE SUPP: 650.0000 mg | Freq: Four times a day (QID) | RECTAL | Status: DC | PRN

## 2023-09-18 MED ORDER — PANTOPRAZOLE SODIUM 40 MG IV SOLR
40.0000 mg | Freq: Two times a day (BID) | INTRAVENOUS | Status: DC
  Administered 2023-09-18 – 2023-09-21 (×7): 40 mg via INTRAVENOUS
  Filled 2023-09-18 (×7): qty 10

## 2023-09-18 MED ORDER — HYDROMORPHONE HCL 2 MG PO TABS
2.0000 mg | ORAL_TABLET | Freq: Four times a day (QID) | ORAL | Status: DC | PRN
  Administered 2023-09-18 – 2023-09-21 (×7): 2 mg via ORAL
  Filled 2023-09-18 (×7): qty 1

## 2023-09-18 MED ORDER — OXYCODONE-ACETAMINOPHEN 5-325 MG PO TABS
1.0000 | ORAL_TABLET | Freq: Once | ORAL | Status: AC
  Administered 2023-09-18: 1 via ORAL
  Filled 2023-09-18: qty 1

## 2023-09-18 MED ORDER — FLUTICASONE FUROATE-VILANTEROL 100-25 MCG/ACT IN AEPB
1.0000 | INHALATION_SPRAY | Freq: Every day | RESPIRATORY_TRACT | Status: DC
  Administered 2023-09-19 – 2023-09-21 (×3): 1 via RESPIRATORY_TRACT
  Filled 2023-09-18: qty 28

## 2023-09-18 MED ORDER — LORAZEPAM 1 MG PO TABS
1.0000 mg | ORAL_TABLET | ORAL | Status: DC | PRN
  Administered 2023-09-19: 2 mg via ORAL
  Administered 2023-09-20: 1 mg via ORAL
  Administered 2023-09-21: 2 mg via ORAL
  Filled 2023-09-18 (×3): qty 1
  Filled 2023-09-18: qty 2

## 2023-09-18 MED ORDER — FOLIC ACID 1 MG PO TABS
1.0000 mg | ORAL_TABLET | Freq: Every day | ORAL | Status: DC
  Administered 2023-09-19 – 2023-09-21 (×3): 1 mg via ORAL
  Filled 2023-09-18 (×4): qty 1

## 2023-09-18 MED ORDER — GABAPENTIN 300 MG PO CAPS
600.0000 mg | ORAL_CAPSULE | Freq: Three times a day (TID) | ORAL | Status: DC
  Administered 2023-09-18 – 2023-09-21 (×8): 600 mg via ORAL
  Filled 2023-09-18 (×9): qty 2

## 2023-09-18 MED ORDER — FUROSEMIDE 10 MG/ML IJ SOLN
6.0000 mg/h | INTRAVENOUS | Status: DC
  Administered 2023-09-18: 4 mg/h via INTRAVENOUS
  Administered 2023-09-20 – 2023-09-21 (×2): 6 mg/h via INTRAVENOUS
  Filled 2023-09-18 (×3): qty 20

## 2023-09-18 MED ORDER — HYDROMORPHONE HCL 1 MG/ML IJ SOLN
0.5000 mg | Freq: Once | INTRAMUSCULAR | Status: AC | PRN
  Administered 2023-09-18: 0.5 mg via INTRAVENOUS
  Filled 2023-09-18: qty 0.5

## 2023-09-18 MED ORDER — LORAZEPAM 2 MG/ML IJ SOLN
2.0000 mg | Freq: Once | INTRAMUSCULAR | Status: AC
  Administered 2023-09-18: 2 mg via INTRAVENOUS
  Filled 2023-09-18 (×2): qty 1

## 2023-09-18 MED ORDER — ACETAMINOPHEN 325 MG PO TABS: 650.0000 mg | ORAL_TABLET | Freq: Four times a day (QID) | ORAL | Status: DC | PRN

## 2023-09-18 MED ORDER — ACETAMINOPHEN 325 MG PO TABS
650.0000 mg | ORAL_TABLET | Freq: Three times a day (TID) | ORAL | Status: DC
  Administered 2023-09-18 – 2023-09-21 (×8): 650 mg via ORAL
  Filled 2023-09-18 (×9): qty 2

## 2023-09-18 MED ORDER — PERFLUTREN LIPID MICROSPHERE
1.0000 mL | INTRAVENOUS | Status: AC | PRN
  Administered 2023-09-18: 4 mL via INTRAVENOUS

## 2023-09-18 MED ORDER — TAMSULOSIN HCL 0.4 MG PO CAPS
0.4000 mg | ORAL_CAPSULE | Freq: Every day | ORAL | Status: DC
  Administered 2023-09-18 – 2023-09-21 (×4): 0.4 mg via ORAL
  Filled 2023-09-18 (×4): qty 1

## 2023-09-18 MED ORDER — INSULIN ASPART 100 UNIT/ML IJ SOLN
0.0000 [IU] | INTRAMUSCULAR | Status: DC
  Administered 2023-09-18 (×2): 2 [IU] via SUBCUTANEOUS
  Administered 2023-09-18: 1 [IU] via SUBCUTANEOUS
  Administered 2023-09-18: 2 [IU] via SUBCUTANEOUS
  Administered 2023-09-18: 3 [IU] via SUBCUTANEOUS
  Administered 2023-09-19 (×3): 2 [IU] via SUBCUTANEOUS
  Administered 2023-09-19: 5 [IU] via SUBCUTANEOUS
  Administered 2023-09-19: 3 [IU] via SUBCUTANEOUS
  Administered 2023-09-19: 2 [IU] via SUBCUTANEOUS
  Administered 2023-09-19: 3 [IU] via SUBCUTANEOUS
  Administered 2023-09-20: 5 [IU] via SUBCUTANEOUS
  Administered 2023-09-20: 3 [IU] via SUBCUTANEOUS

## 2023-09-18 MED ORDER — LORAZEPAM 2 MG/ML IJ SOLN
1.0000 mg | INTRAMUSCULAR | Status: DC | PRN
  Administered 2023-09-20: 2 mg via INTRAVENOUS
  Filled 2023-09-18 (×2): qty 1

## 2023-09-18 MED ORDER — FOLIC ACID 1 MG PO TABS
1.0000 mg | ORAL_TABLET | Freq: Every day | ORAL | Status: DC
  Administered 2023-09-18: 1 mg via ORAL
  Filled 2023-09-18 (×2): qty 1

## 2023-09-18 MED ORDER — DIPHENHYDRAMINE-ZINC ACETATE 2-0.1 % EX CREA
TOPICAL_CREAM | Freq: Three times a day (TID) | CUTANEOUS | Status: DC | PRN
  Filled 2023-09-18: qty 28

## 2023-09-18 MED ORDER — THIAMINE HCL 100 MG/ML IJ SOLN: 100.0000 mg | Freq: Every day | INTRAMUSCULAR | Status: DC

## 2023-09-18 MED ORDER — SODIUM CHLORIDE 0.9% IV SOLUTION: Freq: Once | INTRAVENOUS | Status: AC

## 2023-09-18 MED ORDER — GLUCERNA SHAKE PO LIQD
237.0000 mL | Freq: Three times a day (TID) | ORAL | Status: DC
  Administered 2023-09-19 – 2023-09-20 (×5): 237 mL via ORAL

## 2023-09-18 MED ORDER — SODIUM CHLORIDE 0.9 % IV SOLN
250.0000 mg | Freq: Once | INTRAVENOUS | Status: AC
  Administered 2023-09-18: 250 mg via INTRAVENOUS
  Filled 2023-09-18: qty 20

## 2023-09-18 MED ORDER — UMECLIDINIUM BROMIDE 62.5 MCG/ACT IN AEPB
1.0000 | INHALATION_SPRAY | Freq: Every day | RESPIRATORY_TRACT | Status: DC
  Administered 2023-09-19 – 2023-09-21 (×3): 1 via RESPIRATORY_TRACT
  Filled 2023-09-18: qty 7

## 2023-09-18 MED ORDER — ACETAMINOPHEN 325 MG PO TABS
650.0000 mg | ORAL_TABLET | Freq: Once | ORAL | Status: DC
  Filled 2023-09-18: qty 2

## 2023-09-18 MED ORDER — DILTIAZEM HCL 30 MG PO TABS
30.0000 mg | ORAL_TABLET | Freq: Four times a day (QID) | ORAL | Status: DC
  Administered 2023-09-18 – 2023-09-19 (×4): 30 mg via ORAL
  Filled 2023-09-18 (×4): qty 1

## 2023-09-18 MED ORDER — CYANOCOBALAMIN 1000 MCG/ML IJ SOLN
1000.0000 ug | Freq: Every day | INTRAMUSCULAR | Status: DC
  Administered 2023-09-18 – 2023-09-21 (×4): 1000 ug via INTRAMUSCULAR
  Filled 2023-09-18 (×4): qty 1

## 2023-09-18 MED ORDER — ISOSORBIDE MONONITRATE ER 60 MG PO TB24
30.0000 mg | ORAL_TABLET | Freq: Every day | ORAL | Status: DC
  Administered 2023-09-18 – 2023-09-21 (×4): 30 mg via ORAL
  Filled 2023-09-18 (×4): qty 1

## 2023-09-18 MED ORDER — HYDROCODONE-ACETAMINOPHEN 5-325 MG PO TABS: 1.0000 | ORAL_TABLET | ORAL | Status: DC | PRN

## 2023-09-18 MED ORDER — ONDANSETRON HCL 4 MG/2ML IJ SOLN: 4.0000 mg | Freq: Four times a day (QID) | INTRAMUSCULAR | Status: DC | PRN

## 2023-09-18 MED ORDER — ALBUTEROL SULFATE (2.5 MG/3ML) 0.083% IN NEBU: 2.5000 mg | INHALATION_SOLUTION | Freq: Four times a day (QID) | RESPIRATORY_TRACT | Status: DC | PRN

## 2023-09-18 MED ORDER — THIAMINE MONONITRATE 100 MG PO TABS
100.0000 mg | ORAL_TABLET | Freq: Every day | ORAL | Status: DC
  Administered 2023-09-18 – 2023-09-21 (×4): 100 mg via ORAL
  Filled 2023-09-18 (×4): qty 1

## 2023-09-18 MED ORDER — ONDANSETRON HCL 4 MG PO TABS: 4.0000 mg | ORAL_TABLET | Freq: Four times a day (QID) | ORAL | Status: DC | PRN

## 2023-09-18 MED ORDER — ADULT MULTIVITAMIN W/MINERALS CH
1.0000 | ORAL_TABLET | Freq: Every day | ORAL | Status: DC
  Administered 2023-09-18 – 2023-09-21 (×4): 1 via ORAL
  Filled 2023-09-18 (×4): qty 1

## 2023-09-18 MED ORDER — HYDROMORPHONE HCL 1 MG/ML IJ SOLN
0.5000 mg | Freq: Three times a day (TID) | INTRAMUSCULAR | Status: DC | PRN
  Administered 2023-09-18: 0.5 mg via INTRAVENOUS
  Filled 2023-09-18 (×2): qty 0.5

## 2023-09-18 MED ORDER — VITAMIN B-12 1000 MCG PO TABS: 1000.0000 ug | ORAL_TABLET | Freq: Every day | ORAL | Status: DC

## 2023-09-18 MED ORDER — ALBUTEROL SULFATE HFA 108 (90 BASE) MCG/ACT IN AERS: 2.0000 | INHALATION_SPRAY | Freq: Four times a day (QID) | RESPIRATORY_TRACT | Status: DC | PRN

## 2023-09-18 NOTE — Evaluation (Signed)
Occupational Therapy Evaluation Patient Details Name: Thomas Donovan MRN: [id number] DOB: [birth year] Today's Date: 09/18/2023   History of Present Illness Thomas Donovan is a 62 y.o. male with medical history significant of A-fib with RVR, type 2 diabetes mellitus, chronic kidney disease stage III A, OSA (noncompliant with CPAP), polysubstance abuse (cocaine/alcohol), obesity who presents to the emergency department due to 6 to 8 months onset of sensation of dizziness mostly when he gets up to walk and this has been progressively worsening.  He complained of midsternal chest pain which started about 5 days ago.  Chest pain PTA was associated with nausea, diaphoresis, shortness of breath.  He admits to using cocaine and drinking alcohol PTA.   He complained of persistent blood in urine about 1.5 years ago, this lasted for a few months and self resolved, this reoccurred about 2 months ago for a few weeks, he states that he was was told at an ER that he may have prostate cancer, however, this was never worked up, nor had he been to any urologist.  He endorsed his urine being pink-tinged within the last 3 to 4 days.  He complained of urinary hesitancy and dribbling on urination.  Patient also endorsed of bright red blood per rectum whenever he takes NSAIDs.  Last episode was about 6 months ago.  Last colonoscopy was about 9 years ago, 2 large polyps were removed by patient, he was asked to return in 2 years, but he never followed up. (per DO)   Clinical Impression   Pt agreeable to OT and PT co-evaluation. Pt reports his primary deficit is dizziness in standing, but today he reported that this was not as bad as it has been. Pt appears near baseline for functional mobility and ADL's. Able to don shoes and ambulate in the hall independently. Pt is not recommended for further acute OT services and will be discharged to care of nursing staff for remaining length of stay.              Functional Status  Assessment  Patient has not had a recent decline in their functional status  Equipment Recommendations  None recommended by OT           Precautions / Restrictions Precautions Precautions: Fall Restrictions Weight Bearing Restrictions: No      Mobility Bed Mobility               General bed mobility comments: Sitting at bedside at the start of the session.    Transfers Overall transfer level: Independent                 General transfer comment: EOB to chair without assist. Pt reports typical dizziness in standing, but not as bad today.      Balance Overall balance assessment: Mild deficits observed, not formally tested                                         ADL either performed or assessed with clinical judgement   ADL Overall ADL's : Independent                                             Vision Baseline Vision/History: 1 Wears glasses Ability to See in Adequate Light: 1 Impaired  Patient Visual Report: No change from baseline Vision Assessment?: No apparent visual deficits;Wears glasses for reading     Perception Perception: Not tested       Praxis Praxis: Not tested       Pertinent Vitals/Pain Pain Assessment Pain Assessment: Faces Faces Pain Scale: Hurts a little bit Pain Location: back, legs Pain Descriptors / Indicators: Discomfort Pain Intervention(s): Monitored during session, Repositioned     Extremity/Trunk Assessment Upper Extremity Assessment Upper Extremity Assessment: RUE deficits/detail;Left hand dominant RUE Deficits / Details: R shoulder limited to 50% A/ROM for shoulder flexion and abduction at baseline.   Lower Extremity Assessment Lower Extremity Assessment: Defer to PT evaluation   Cervical / Trunk Assessment Cervical / Trunk Assessment: Normal   Communication Communication Communication: No apparent difficulties   Cognition Arousal: Alert Behavior During Therapy: WFL for  tasks assessed/performed Overall Cognitive Status: Within Functional Limits for tasks assessed                                                        Home Living Family/patient expects to be discharged to:: Private residence Living Arrangements: Spouse/significant other Available Help at Discharge: Family;Available 24 hours/day Type of Home: House Home Access: Level entry     Home Layout: One level     Bathroom Shower/Tub: Chief Strategy Officer: Standard Bathroom Accessibility: Yes How Accessible: Accessible via walker Home Equipment: None   Additional Comments: Pt is living at a friends house currently.      Prior Functioning/Environment Prior Level of Function : Independent/Modified Independent             Mobility Comments: Community ambulator without AD other than in the grocery store where pt uses a scooter due to dizziness. Drives ADLs Comments: Independent                                Co-evaluation PT/OT/SLP Co-Evaluation/Treatment: Yes Reason for Co-Treatment: To address functional/ADL transfers   OT goals addressed during session: ADL's and self-care      AM-PAC OT "6 Clicks" Daily Activity     Outcome Measure Help from another person eating meals?: None Help from another person taking care of personal grooming?: None Help from another person toileting, which includes using toliet, bedpan, or urinal?: None Help from another person bathing (including washing, rinsing, drying)?: None Help from another person to put on and taking off regular upper body clothing?: None Help from another person to put on and taking off regular lower body clothing?: None 6 Click Score: 24   End of Session    Activity Tolerance: Patient tolerated treatment well Patient left: in chair;with call bell/phone within reach  OT Visit Diagnosis: Unsteadiness on feet (R26.81);Other abnormalities of gait and mobility (R26.89)                 Time: 1610-9604 OT Time Calculation (min): 17 min Charges:  OT General Charges $OT Visit: 1 Visit OT Evaluation $OT Eval Low Complexity: 1 Low  [name] OT, MOT  [name] 09/18/2023, 9:16 AM
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
## 2023-09-18 NOTE — ED Notes (Signed)
Gave report to [employer]

## 2023-09-18 NOTE — TOC Initial Note (Signed)
Transition of Care [facility]) - Initial/Assessment Note    Patient Details  Name: Thomas Donovan MRN: [id number] Date of Birth: [birth year]  [facility]) CM/SW Contact:    Elliot Gault, LCSW Phone Number: 09/18/2023, 1:53 PM  Clinical Narrative:                  Pt admitted from community. Received TOC consult stating pt is homeless.  TOC met with pt at bedside to assess. Per pt, he does not have his own place and he has been staying with friends. Shelter list was offered and pt declined. Pt also declined offer for SA treatment resources. Pt states he is on the Housing authority waiting list.   TOC will follow and assist if needs arise.  Expected Discharge Plan:  (friend's home) Barriers to Discharge: Continued Medical Work up   Patient Goals and CMS Choice Patient states their goals for this hospitalization and ongoing recovery are:: get better          Expected Discharge Plan and Services In-house Referral: Clinical Social Work     Living arrangements for the past 2 months: No permanent address                                      Prior Living Arrangements/Services Living arrangements for the past 2 months: No permanent address Lives with:: Friends Patient language and need for interpreter reviewed:: Yes Do you feel safe going back to the place where you live?: Yes      Need for Family Participation in Patient Care: No (Comment)     Criminal Activity/Legal Involvement Pertinent to Current Situation/Hospitalization: No - Comment as needed  Activities of Daily Living Home Assistive Devices/Equipment: Eyeglasses, CBG Meter (Simultaneous filing. User may not have seen previous data.) ADL Screening (condition at time of admission) Patient's cognitive ability adequate to safely complete daily activities?: Yes Is the patient deaf or have difficulty hearing?: No Does the patient have difficulty seeing, even when wearing glasses/contacts?: No Does the  patient have difficulty concentrating, remembering, or making decisions?: No Patient able to express need for assistance with ADLs?: Yes Does the patient have difficulty dressing or bathing?: No Independently performs ADLs?: Yes (appropriate for developmental age) Does the patient have difficulty walking or climbing stairs?: No Weakness of Legs: Both Weakness of Arms/Hands: None  Permission Sought/Granted                  Emotional Assessment Appearance:: Appears stated age Attitude/Demeanor/Rapport: Engaged, Reactive Affect (typically observed): Irritable, Frustrated Orientation: : Oriented to Self, Oriented to Place, Oriented to  Time, Oriented to Situation Alcohol / Substance Use: Illicit Drugs Psych Involvement: No (comment)  Admission diagnosis:  Hyponatremia [E87.1] Cocaine abuse (HCC) [F14.10] Chronic anticoagulation [Z79.01] Guaiac positive stools [R19.5] Renal insufficiency [N28.9] Acute alcohol intoxication, uncomplicated (HCC) [F10.920] Symptomatic anemia [D64.9] Atrial fibrillation, unspecified type (HCC) [I48.91] Patient Active Problem List   Diagnosis Date Noted   Symptomatic anemia 09/18/2023   Acute on chronic blood loss anemia 09/18/2023   GI bleed 09/18/2023   Alcohol use disorder 09/18/2023   Cocaine abuse (HCC) 09/18/2023   Chronic kidney disease, stage 3a (HCC) 09/18/2023   BPH (benign prostatic hyperplasia) 09/18/2023   Hematuria 09/18/2023   Atrial fibrillation, chronic (HCC) 09/18/2023   Abscess of perineum    Tobacco use 08/22/2017   Hypokalemia 08/22/2017   GERD (gastroesophageal reflux disease) 08/22/2017  Uncontrolled type 2 diabetes mellitus with hyperglycemia, with long-term current use of insulin (HCC) 08/22/2017   Syncope 08/21/2017   Atypical chest pain 08/21/2017   Dyspnea 08/21/2017   Hematochezia 08/21/2017   S/P lumbar spinal fusion 11/20/2013   PCP:  Carmel Sacramento, NP Pharmacy:   [facility] [place], [place] - [address] [place] [address] Phone: [phone] Fax: [phone]     Social Determinants of Health (SDOH) Social History: SDOH Screenings   Food Insecurity: No Food Insecurity (01/05/2023)   Received from [facility], [facility]  Transportation Needs: No Transportation Needs (01/05/2023)   Received from [facility], [facility]  Financial Resource Strain: Medium Risk (01/05/2023)   Received from [facility], [facility]  Tobacco Use: High Risk (09/18/2023)  Health Literacy: High Risk (07/05/2021)   Received from [facility], [facility]   SDOH Interventions:     Readmission Risk Interventions     No data to display

## 2023-09-18 NOTE — H&P (Addendum)
History and Physical    Patient: Thomas Donovan UVO:[id number] DOB: January 14, 1961 DOA: 09/17/2023 DOS: the patient was seen and examined on 09/18/2023 PCP: Carmel [place], NP  Patient coming from: Home  Chief Complaint:  Chief Complaint  Patient presents with   Chest Pain   HPI: Thomas Donovan is a 62 y.o. male with medical history significant of A-fib with RVR, type 2 diabetes mellitus, chronic kidney disease stage III A, OSA (noncompliant with CPAP), polysubstance abuse (cocaine/alcohol), obesity who presents to the emergency department due to 6 to 8 months onset of sensation of dizziness mostly when he gets up to walk and this has been progressively worsening.  He complained of midsternal chest pain which started about 5 days ago.  Chest pain PTA was associated with nausea, diaphoresis, shortness of breath.  He admits to using cocaine and drinking alcohol PTA.  He complained of persistent blood in urine about 1.5 years ago, this lasted for a few months and self resolved, this reoccurred about 2 months ago for a few weeks, he states that he was was told at an ER that he may have prostate cancer, however, this was never worked up, nor had he been to any urologist.  He endorsed his urine being pink-tinged within the last 3 to 4 days.  He complained of urinary hesitancy and dribbling on urination. Patient also endorsed of bright red blood per rectum whenever he takes NSAIDs.  Last episode was about 6 months ago.  Last colonoscopy was about 9 years ago, 2 large polyps were removed by patient, he was asked to return in 2 years, but he never followed up. Patient states that he has had about 6 blood transfusions since December of last year.  ED Course:  In the emergency department, he was tachypneic, tachycardic, BP was 122/90, other vital signs were within normal range.  Workup in the ED showed hemoglobin 6.6 (hemoglobin on 07/24/2023 was 7.4 -Care Everywhere), hematocrit 23.5, MCV 78.1, platelets 322.   BMP was normal except for sodium of 133, bicarb 21, glucose 173, creatinine 1.65 (baseline creatinine at 1.5 - Care Everywhere), albumin 3.3, alcohol level 120, urine drug screen was positive for cocaine, troponin x 2 - 8 > 9, folate 5.0.  FOBT was positive per EDP. Chest x-ray showed no active disease Percocet was given.  2 units of PRBC ordered to be transfused in the ED. Hospitalist was asked to admit patient for further evaluation and management.  Review of Systems: Review of systems as noted in the HPI. All other systems reviewed and are negative.   Past Medical History:  Diagnosis Date   Aortic aneurysm (HCC) 2019   Chronic back pain    Cold    recent rx   Coronary artery disease    Degeneration of lumbar intervertebral disc    Diabetes mellitus    GERD (gastroesophageal reflux disease)    Gout    History of kidney stones    Hypertension    Pneumonia    Past Surgical History:  Procedure Laterality Date   BACK SURGERY     IRRIGATION AND DEBRIDEMENT ABSCESS N/A 07/15/2019   Procedure: IRRIGATION AND DRAINAGE OF PERINEAL ABSCESS;  Surgeon: Franky Macho, MD;  Location: [facility];  Service: General;  Laterality: N/A;   MAXIMUM ACCESS (MAS)POSTERIOR LUMBAR INTERBODY FUSION (PLIF) 1 LEVEL  11/20/2013   Procedure: FOR MAXIMUM ACCESS (MAS) POSTERIOR LUMBAR INTERBODY FUSION LUMBAR FIVE-SACRAL ONE;  Surgeon: Tia Alert, MD;  Location: [facility];  Service: Neurosurgery;;  FOR MAXIMUM ACCESS (MAS) POSTERIOR LUMBAR INTERBODY FUSION LUMBAR FIVE-SACRAL ONE   TONSILLECTOMY      Social History:  reports that he has been smoking cigarettes. He has a 5 pack-year smoking history. He has never used smokeless tobacco. He reports current alcohol use of about 12.0 standard drinks of alcohol per week. He reports that he does not use drugs.   Allergies  Allergen Reactions   Celebrex [Celecoxib] Anaphylaxis   Aspirin Other (See Comments)    Medication is contraindicated with pts gout medicine.      Ibuprofen Other (See Comments)    Reaction:  GI bleeding    Toradol [Ketorolac Tromethamine] Other (See Comments)    Reaction:  Blisters between fingers    Tramadol Hives         History reviewed. No pertinent family history.   Prior to Admission medications   Medication Sig Start Date End Date Taking? Authorizing Provider  albuterol (VENTOLIN HFA) 108 (90 Base) MCG/ACT inhaler Inhale 2 puffs into the lungs every 6 (six) hours as needed for pain. 06/21/19   [provider]  colchicine 0.6 MG tablet Take 1-2 tablets (0.6-1.2 mg total) by mouth 2 (two) times daily as needed (for gout flares). 08/23/17   Standley Brooking, MD  cyclobenzaprine (FLEXERIL) 10 MG tablet Take 1 tablet (10 mg total) by mouth 3 (three) times daily. 07/22/18   Dione Booze, MD  gabapentin (NEURONTIN) 800 MG tablet Take 800 mg by mouth 4 (four) times daily. 02/23/18   [provider]  glipiZIDE (GLUCOTROL XL) 5 MG 24 hr tablet Take 5 mg by mouth daily with breakfast.    [provider]  losartan (COZAAR) 50 MG tablet Take 50 mg by mouth daily.    [provider]  metFORMIN (GLUCOPHAGE-XR) 500 MG 24 hr tablet Take 1-2 tablets (500-1,000 mg total) by mouth 2 (two) times daily. Pt takes two tablets in the morning and one at bedtime. Patient taking differently: Take 500 mg by mouth 3 (three) times daily.  08/23/17   Standley Brooking, MD  Omeprazole 20 MG TBEC Take 20 mg by mouth 2 (two) times a day.  11/25/17   [provider]  predniSONE (DELTASONE) 10 MG tablet TAKE Q DAY 6,5,4,3,2,1 02/07/21   Mancel Bale, MD  sulfamethoxazole-trimethoprim (BACTRIM DS) 800-160 MG tablet Take 1 tablet by mouth 2 (two) times daily. 06/25/19   Franky Macho, MD  tamsulosin (FLOMAX) 0.4 MG CAPS capsule Take 1 capsule (0.4 mg total) by mouth daily. 07/22/18   Dione Booze, MD    Physical Exam: BP (!) 153/97 (BP Location: Left Arm)   Pulse (!) 110   Temp 98.2 F (36.8 C) (Oral)   Resp 20    Ht 6\' 1"  (1.854 m)   Wt 134.7 kg   SpO2 97%   BMI 39.17 kg/m   General: 62 y.o. year-old male well developed well nourished in no acute distress.  Alert and oriented x3. HEENT: NCAT, EOMI Neck: Supple, trachea medial Cardiovascular: Irregularly irregular rate and rhythm with no rubs or gallops.  No thyromegaly or JVD noted.  No lower extremity edema. 2/4 pulses in all 4 extremities. Respiratory: Clear to auscultation with no wheezes or rales. Good inspiratory effort. Abdomen: Soft, nontender nondistended with normal bowel sounds x4 quadrants. Muskuloskeletal: No cyanosis, clubbing or edema noted bilaterally Neuro: CN II-XII intact, strength 5/5 x 4, sensation, reflexes intact Skin: No ulcerative lesions noted or rashes Psychiatry: Judgement and insight appear normal.  Mood is appropriate for condition and setting          Labs on Admission:  Basic Metabolic Panel: Recent Labs  Lab 09/17/23 2325  NA 133*  K 3.8  CL 99  CO2 21*  GLUCOSE 173*  BUN 17  CREATININE 1.65*  CALCIUM 8.5*   Liver Function Tests: Recent Labs  Lab 09/17/23 2325  AST 18  ALT 14  ALKPHOS 76  BILITOT 0.6  PROT 6.1*  ALBUMIN 3.3*   No results for input(s): "LIPASE", "AMYLASE" in the last 168 hours. No results for input(s): "AMMONIA" in the last 168 hours. CBC: Recent Labs  Lab 09/17/23 2325  WBC 6.2  NEUTROABS 3.8  HGB 6.6*  HCT 23.5*  MCV 78.1*  PLT 322   Cardiac Enzymes: No results for input(s): "CKTOTAL", "CKMB", "CKMBINDEX", "TROPONINI" in the last 168 hours.  BNP (last 3 results) No results for input(s): "BNP" in the last 8760 hours.  ProBNP (last 3 results) No results for input(s): "PROBNP" in the last 8760 hours.  CBG: Recent Labs  Lab 09/18/23 0351  GLUCAP 184*    Radiological Exams on Admission: DG Chest Port 1 View  Result Date: 09/17/2023 CLINICAL DATA:  Near syncope chest pain EXAM: PORTABLE CHEST 1 VIEW COMPARISON:  07/03/2018 FINDINGS: The heart size and  mediastinal contours are within normal limits. Both lungs are clear. The visualized skeletal structures are unremarkable. IMPRESSION: No active disease. Electronically Signed   By: Jasmine Pang M.D.   On: 09/17/2023 23:59    EKG: I independently viewed the EKG done and my findings are as followed: Atrial fibrillation with RVR  Assessment/Plan Present on Admission:  Symptomatic anemia  Atypical chest pain  GERD (gastroesophageal reflux disease)  Principal Problem:   Symptomatic anemia Active Problems:   Atypical chest pain   GERD (gastroesophageal reflux disease)   Uncontrolled type 2 diabetes mellitus with hyperglycemia, with long-term current use of insulin (HCC)   Acute on chronic blood loss anemia   GI bleed   Alcohol use disorder   Cocaine abuse (HCC)   Chronic kidney disease, stage 3a (HCC)   BPH (benign prostatic hyperplasia)   Hematuria  Symptomatic anemia Acute on chronic blood loss anemia GI bleed Hemoglobin 6.6 (hemoglobin on 07/24/2023 was 7.4 -Care Everywhere) Hemoccult was positive per EDP Patient states that he takes Eliquis and also takes NSAIDs sometimes, these could have contributed to patient's GI bleed. Type and crossmatch was done and 2 units of PRBC was ordered to be transfused in the ED Continue IV Protonix 40 mg twice daily Patient was placed n.p.o. in anticipation for possible colonoscopy in the morning Gastroenterologist  will be consulted in the morning  Atypical chest pain This may be related to cocaine abuse Troponin x 2 was normal He no longer complains of chest pain, though still complains of chronic back pain  Cocaine abuse Urine drug screen was positive for cocaine Patient was counseled on cocaine abuse cessation  Alcohol use disorder Alcohol level was 120, was counseled on alcohol abuse disorder Patient showed no signs of alcohol withdrawal at this time Continue to monitor patient and consider starting CIWA protocol for any withdrawal  symptoms  Chronic Atrial Fibrillation No home meds per med rec  CKD stage IIIA Creatinine 1.65 (baseline creatinine at 1.5 Renally adjust medications, avoid nephrotoxic agents/dehydration/hypotension  Hypoalbuminemia possibly secondary to mild protein calorie malnutrition Albumin 3.3, protein supplement will be provided  Chronic back pain Continue Norco as needed Continue PT/OT eval and treat  Uncontrolled type 2 diabetes mellitus with hyperglycemia Last A1c in May 2023 was 7.0 -care everywhere Continue ISS and hypoglycemia protocol Metformin and glipizide will be held at this time Hemoglobin A1c will be checked  Chronic HFpEF Patient was hemodynamically stable at this time Continue total input/output, daily weights and fluid restriction Echocardiogram done in August, 2018 showed LVEF of 65 to 70%, G1 DD.    GERD Continue Protonix  BPH Patient states that he ran out of his Flomax a few days ago and has been having difficulty in urination Continue Flomax  Hematuria Patient reported history of hematuria.  Patient will need outpatient urology consult for further workup  Obesity (BMI 39.17) Diet and lifestyle modification  OSA on CPAP Continue CPAP  Social issues: Patient states that he is homeless TOC will be consulted to help with disposition on discharge  DVT prophylaxis: SCDs  Advance Care Planning: CODE STATUS: Full code  Consults: Gastroenterology  Family Communication: None at bedside  Severity of Illness: The appropriate patient status for this patient is OBSERVATION. Observation status is judged to be reasonable and necessary in order to provide the required intensity of service to ensure the patient's safety. The patient's presenting symptoms, physical exam findings, and initial radiographic and laboratory data in the context of their medical condition is felt to place them at decreased risk for further clinical deterioration. Furthermore, it is  anticipated that the patient will be medically stable for discharge from the hospital within 2 midnights of admission.   Author: Frankey Shown, DO 09/18/2023 4:03 AM  For on call review [email or web address].

## 2023-09-18 NOTE — Progress Notes (Signed)
CPAP placed in room , patient did not wish to wear tonight.

## 2023-09-18 NOTE — Progress Notes (Signed)
   09/18/23 0443  Vitals  Temp 98 F (36.7 C)  Temp Source Oral  BP (!) 113/57  MAP (mmHg) 73  BP Location Left Arm  BP Method Automatic  Patient Position (if appropriate) Lying  Pulse Rate (!) 119  Pulse Rate Source Dinamap  MEWS COLOR  MEWS Score Color Yellow  Oxygen Therapy  SpO2 94 %  O2 Device Room Air  MEWS Score  MEWS Temp 0  MEWS Systolic 0  MEWS Pulse 2  MEWS RR 0  MEWS LOC 0  MEWS Score 2  Provider Notification  Provider Name/Title MD Adefeso  Date Provider Notified 09/18/23  Time Provider Notified [phone]  Method of Notification  (Secure chat)  Notification Reason Other (Comment) (Elevated HR)  Provider response No new orders  Date of Provider Response 09/18/23  Time of Provider Response 0545    Patient went to a yellow MEWS. HR elevated. MD Adefeso notified.

## 2023-09-18 NOTE — Progress Notes (Signed)
Progress Note  RN called and states that patient was suspected to be having a transfusion reaction due to being tachycardic, short of breath and broke out in sweat.  Blood transfusion was suspended (patient already had 1 unit of blood and was already started on a second unit of blood). At bedside, patient was sitting on side of bed and was in no acute distress.  Patient states that he usually sweats at home and that oftentimes he wakes up and notes that he broke out in sweat during sleep. It is uncertain at this time if this was due to a transfusion reaction, nonetheless, we shall continue to monitor patient for any further adverse reaction, alcohol withdrawal symptoms, chest pain or any distress in relation to patient's cocaine use and treat accordingly. Supplemental oxygen via [place] at 2 LPM was provided for comfort.  BP (!) 147/82 (BP Location: Left Arm)   Pulse (!) 127   Temp 97.9 F (36.6 C) (Oral)   Resp 18   Ht 6\' 1"  (1.854 m)   Wt 134.7 kg   SpO2 95%   BMI 39.17 kg/m   Please refer to admission H&P for details regarding the care of this patient.  Total time:  22 minutes This includes time reviewing the chart including progress notes, labs, EKGs, talking to patient at bedside, taking medical decisions, ordering labs and documenting findings.

## 2023-09-18 NOTE — Plan of Care (Signed)
  Problem: Acute Rehab PT Goals(only PT should resolve) Goal: Pt Will Go Supine/Side To Sit Outcome: Progressing Flowsheets (Taken 09/18/2023 1410) Pt will go Supine/Side to Sit: Independently Goal: Patient Will Transfer Sit To/From Stand Outcome: Progressing Flowsheets (Taken 09/18/2023 1410) Patient will transfer sit to/from stand: Independently Goal: Pt Will Transfer Bed To Chair/Chair To Bed Outcome: Progressing Flowsheets (Taken 09/18/2023 1410) Pt will Transfer Bed to Chair/Chair to Bed: Independently Goal: Pt Will Ambulate Outcome: Progressing Flowsheets (Taken 09/18/2023 1410) Pt will Ambulate:  > 125 feet  Independently   2:10 PM, 09/18/23 Ocie Bob, MPT Physical Therapist with [facility] [phone] office [phone] mobile phone

## 2023-09-18 NOTE — Progress Notes (Signed)
   09/18/23 0810  Assess: MEWS Score  Temp (!) 97.5 F (36.4 C)  BP 126/84  MAP (mmHg) 98  Pulse Rate (!) 112  Resp 19  SpO2 97 %  O2 Device Nasal Cannula  O2 Flow Rate (L/min) 2 L/min  Assess: MEWS Score  MEWS Temp 0  MEWS Systolic 0  MEWS Pulse 2  MEWS RR 0  MEWS LOC 0  MEWS Score 2  MEWS Score Color Yellow  Assess: if the MEWS score is Yellow or Red  Were vital signs accurate and taken at a resting state? Yes  Does the patient meet 2 or more of the SIRS criteria? No  MEWS guidelines implemented  No, previously yellow, continue vital signs every 4 hours  Notify: Charge Nurse/RN  Name of Charge Nurse/RN Notified Psychologist, prison and probation services  Provider Notification  Provider Name/Title MD Lucianne Muss  Date Provider Notified 09/18/23  Time Provider Notified [phone]  Method of Notification Page (Secure chat)  Notification Reason Other (Comment) (yellow MEWs)  Provider response See new orders  Date of Provider Response 09/18/23  Time of Provider Response 0900  Assess: SIRS CRITERIA  SIRS Temperature  0  SIRS Pulse 1  SIRS Respirations  0  SIRS WBC 0  SIRS Score Sum  1

## 2023-09-18 NOTE — ED Notes (Signed)
Advised Dr Preston Fleeting of hemoglobin of 6.6

## 2023-09-18 NOTE — Progress Notes (Signed)
   09/18/23 1537  Assess: MEWS Score  Temp 98 F (36.7 C)  BP (!) 143/81  MAP (mmHg) 98  Pulse Rate 96  Resp 20  SpO2 99 %  O2 Device Nasal Cannula  O2 Flow Rate (L/min) 2 L/min  Assess: MEWS Score  MEWS Temp 0  MEWS Systolic 0  MEWS Pulse 0  MEWS RR 0  MEWS LOC 0  MEWS Score 0  MEWS Score Color Green  Assess: SIRS CRITERIA  SIRS Temperature  0  SIRS Pulse 1  SIRS Respirations  0  SIRS WBC 0  SIRS Score Sum  1

## 2023-09-18 NOTE — Progress Notes (Signed)
MD Adefeso to bedside, no new orders obtained, patient resting in bed at this time, placed on 2L for SOB

## 2023-09-18 NOTE — Progress Notes (Signed)
[facility] Progress Note  Patient: Thomas Donovan    ZOX:[id number]  DOA: 09/17/2023     Date of Service: the patient was seen and examined on 09/18/2023  Chief Complaint  Patient presents with   Chest Pain   Brief hospital course: MADDYX NAND is a 62 y.o. male with medical history significant of A-fib with RVR, type 2 diabetes mellitus, chronic kidney disease stage III A, OSA (noncompliant with CPAP), polysubstance abuse (cocaine/alcohol), obesity who presents to the emergency department due to 6 to 8 months onset of sensation of dizziness mostly when he gets up to walk and this has been progressively worsening.  He complained of midsternal chest pain which started about 5 days ago.  Chest pain PTA was associated with nausea, diaphoresis, shortness of breath.  He admits to using cocaine and drinking alcohol PTA.  He complained of persistent blood in urine about 1.5 years ago, this lasted for a few months and self resolved, this reoccurred about 2 months ago for a few weeks, he states that he was was told at an ER that he may have prostate cancer, however, this was never worked up, nor had he been to any urologist.  He endorsed his urine being pink-tinged within the last 3 to 4 days.  He complained of urinary hesitancy and dribbling on urination. Patient also endorsed of bright red blood per rectum whenever he takes NSAIDs.  Last episode was about 6 months ago.  Last colonoscopy was about 9 years ago, 2 large polyps were removed by patient, he was asked to return in 2 years, but he never followed up. Patient states that he has had about 6 blood transfusions since December of last year.   ED Course: Pt was tachypneic, tachycardic, BP 122/90, other vital signs were within normal range.   Hb 6.6 (hemoglobin on 07/24/2023 was 7.4 -Care Everywhere), hematocrit 23.5, MCV 78.1, platelets 322.  BMP was normal except for sodium of 133, bicarb 21, glucose 173, creatinine 1.65 (baseline creatinine  at 1.5 - Care Everywhere), albumin 3.3, alcohol level 120, urine drug screen was positive for cocaine, troponin x 2 - 8 > 9, folate 5.0.  FOBT was positive per EDP. Chest x-ray showed no active disease Percocet was given.  1 units of PRBC, pt developed sweating so 2nd unit was given as per pt Hospitalist was asked to admit patient for further evaluation and management.   Assessment and Plan: Principal Problem:   Symptomatic anemia Active Problems:   Atypical chest pain   GERD (gastroesophageal reflux disease)   Uncontrolled type 2 diabetes mellitus with hyperglycemia, with long-term current use of insulin (HCC)   Acute on chronic blood loss anemia   GI bleed   Alcohol use disorder   Cocaine abuse (HCC)   Chronic kidney disease, stage 3a (HCC)   BPH (benign prostatic hyperplasia)   Hematuria   # Symptomatic anemia Acute on chronic blood loss anemia GI bleed Hemoglobin 6.6 (hemoglobin on 07/24/2023 was 7.4 -Care Everywhere) Hemoccult was positive per EDP Patient states that he takes Eliquis and also takes NSAIDs sometimes, these could have contributed to patient's GI bleed. S/p 1 U prbc, Hb 8.2 posttransfusion Continue IV Protonix 40 mg twice daily Started clear liquid diet Patient was placed n.p.o. in anticipation for possible colonoscopy in the morning Gastroenterologist  will be consulted in the morning   Iron deficiency, transferrin saturation 4% Ferric gluconate to 50 mg IV one-time dose given Start oral iron supplement on discharge Follow  with PCP to repeat iron profile after 3 to 6 months  Vitamin B12 deficiency, B12 level 166, goal >400, started vitamin B12 1000 mcg IM injection daily during hospital stay followed by oral supplement on discharge. Follow-up with PCP to repeat B12 level after 3 to 6 months.  Folic acid deficiency, started folic acid 1 mg p.o. daily.  Follow with PCP to repeat folic acid level after 3 to 6 months.   Chronic Atrial Fibrillation with  RVR Med rec shows patient was on Cardizem and Eliquis Resumed Cardizem 30 mg p.o. every 6 hourly Held Eliquis for now due to GI bleeding Follow cardiology consult for further recommendation Follow TTE  Chronic HFpEF Patient was hemodynamically stable at this time Continue total input/output, daily weights and fluid restriction Echocardiogram done in August, 2018 showed LVEF of 65 to 70%, G1 DD.   Significant edema of bilateral lower extremities, started Lasix IV infusion Monitor renal functions daily and urine output Follow TTE Cardiology consulted as above  Atypical chest pain This may be related to cocaine abuse Troponin x 4 was normal He no longer complains of chest pain, though still complains of chronic back pain Started Imdur 30 mg p.o. daily   Cocaine abuse Urine drug screen was positive for cocaine Patient was counseled on cocaine abuse cessation   Alcohol use disorder Alcohol level was 120, was counseled on alcohol abuse disorder Patient showed no signs of alcohol withdrawal at this time Continue to monitor patient and consider starting CIWA protocol for any withdrawal symptoms     CKD stage IIIA Creatinine 1.65 (baseline creatinine at 1.5 Renally adjust medications, avoid nephrotoxic agents/dehydration/hypotension   Hypoalbuminemia possibly secondary to mild protein calorie malnutrition Albumin 3.3, protein supplement will be provided   Chronic back pain Started Tylenol 650 mg p.o. 3 times daily scheduled, gabapentin 300 mg p.o. 3 times daily, Dilaudid oral and IV as needed Continue PT/OT eval and treat   Uncontrolled type 2 diabetes mellitus with hyperglycemia Last A1c in May 2023 was 7.0 -care everywhere Continue ISS and hypoglycemia protocol Metformin and glipizide will be held at this time Hemoglobin A1c will be checked   COPD, no exacerbation noticed on exam Continue home inhalers   GERD Continue Protonix   BPH Patient states that he ran out of  his Flomax a few days ago and has been having difficulty in urination Continue Flomax   Hematuria Patient reported history of hematuria.  Patient will need outpatient urology consult for further workup  Body mass index is 39.17 kg/m.  Interventions:  Diet: CLD DVT Prophylaxis: SCD, pharmacological prophylaxis contraindicated due to GI bleeding     Advance goals of care discussion: Full code  Family Communication: family was not present at bedside, at the time of interview.  The pt provided permission to discuss medical plan with the family. Opportunity was given to ask question and all questions were answered satisfactorily.   Disposition:  Pt is from this community /Homeless, admitted with symptomatic anemia, and A-fib with RVR, needs GI workup and cardiology consulted as well, which precludes a safe discharge. Discharge to home, when stable, may need few days to stay in the hospital..  Subjective: No significant events overnight, after 1 unit of PRBC transfusion patient was tachycardic, short of breath and broke out in sweat, suspected transfusion reaction by RN.  Patient was seen by admitting physician, vital signs stable.  Second unit of PRBC transfusion was canceled.  We will continue to monitor.  No sinus symptoms  at this time for any transfusion reaction. Denied any chest pain or palpitations at this time.  No abdominal pain, no nausea vomiting or diarrhea.  Physical Exam: General: NAD, lying comfortably Appear in no distress, affect appropriate Eyes: PERRLA ENT: Oral Mucosa Clear, moist  Neck: no JVD,  Cardiovascular: S1 and S2 Present, no Murmur,  Respiratory: good respiratory effort, Bilateral Air entry equal and Decreased, no Crackles, no wheezes Abdomen: Bowel Sound present, Soft and no tenderness,  Skin: no rashes Extremities: no Pedal edema, no calf tenderness Neurologic: without any new focal findings Gait not checked due to patient safety concerns  Vitals:    09/18/23 0459 09/18/23 0528 09/18/23 0636 09/18/23 0810  BP: 137/66 125/63 (!) 147/82 126/84  Pulse: (!) 125 (!) 110 (!) 127 (!) 112  Resp: 18 18  19   Temp: 98.2 F (36.8 C) 97.6 F (36.4 C) 97.9 F (36.6 C) (!) 97.5 F (36.4 C)  TempSrc: Oral Oral Oral   SpO2: 95% 95%  97%  Weight:      Height:        Intake/Output Summary (Last 24 hours) at 09/18/2023 1324 Last data filed at 09/18/2023 1300 Gross per 24 hour  Intake 1344 ml  Output 300 ml  Net 1044 ml   Filed Weights   09/17/23 2305 09/18/23 0225  Weight: 127 kg 134.7 kg    Data Reviewed: I have personally reviewed and interpreted daily labs, tele strips, imagings as discussed above. I reviewed all nursing notes, pharmacy notes, vitals, pertinent old records I have discussed plan of care as described above with RN and patient/family.  CBC: Recent Labs  Lab 09/17/23 2325 09/18/23 0722  WBC 6.2 6.0  NEUTROABS 3.8  --   HGB 6.6* 8.2*  HCT 23.5* 27.7*  MCV 78.1* 78.5*  PLT 322 322   Basic Metabolic Panel: Recent Labs  Lab 09/17/23 2325 09/18/23 0722  NA 133* 136  K 3.8 4.2  CL 99 102  CO2 21* 23  GLUCOSE 173* 212*  BUN 17 18  CREATININE 1.65* 1.63*  CALCIUM 8.5* 8.6*  MG  --  1.8  PHOS  --  3.7    Studies: DG Chest Port 1 View  Result Date: 09/17/2023 CLINICAL DATA:  Near syncope chest pain EXAM: PORTABLE CHEST 1 VIEW COMPARISON:  07/03/2018 FINDINGS: The heart size and mediastinal contours are within normal limits. Both lungs are clear. The visualized skeletal structures are unremarkable. IMPRESSION: No active disease. Electronically Signed   By: Jasmine Pang M.D.   On: 09/17/2023 23:59    Scheduled Meds:  acetaminophen  650 mg Oral TID   cyanocobalamin  1,000 mcg Intramuscular Daily   Followed by   Melene Muller ON 09/25/2023] vitamin B-12  1,000 mcg Oral Daily   diltiazem  30 mg Oral Q6H   feeding supplement (GLUCERNA SHAKE)  237 mL Oral TID BM   fluticasone furoate-vilanterol  1 puff Inhalation Daily    And   umeclidinium bromide  1 puff Inhalation Daily   folic acid  1 mg Oral Daily   gabapentin  600 mg Oral TID   insulin aspart  0-9 Units Subcutaneous Q4H   isosorbide mononitrate  30 mg Oral Daily   pantoprazole (PROTONIX) IV  40 mg Intravenous Q12H   tamsulosin  0.4 mg Oral Daily   Continuous Infusions:  furosemide (LASIX) 200 mg in dextrose 5 % 100 mL (2 mg/mL) infusion     PRN Meds: albuterol, diphenhydrAMINE-zinc acetate, HYDROmorphone (DILAUDID) injection, HYDROmorphone, ondansetron **OR**  ondansetron (ZOFRAN) IV  Time spent: 55 minutes  Author: Gillis Santa. MD [facility] 09/18/2023 1:24 PM  To reach On-call, see care teams to locate the attending and reach out to them via [email or web address]. If 7PM-7AM, please contact night-coverage If you still have difficulty reaching the attending provider, please page the [facility] (Director on Call) for [facility] on amion for assistance.

## 2023-09-18 NOTE — ED Notes (Signed)
..ED TO INPATIENT HANDOFF REPORT  ED Nurse Name and Phone #: [phone]  S Name/Age/Gender Thomas Donovan 62 y.o. male Room/Bed: APA10/APA10  Code Status   Code Status: Prior  Home/SNF/Other Home Patient oriented to: self, place, time, and situation Is this baseline? Yes   Triage Complete: Triage complete  Chief Complaint Symptomatic anemia [D64.9]  Triage Note Complaining of chest pain that started 5 days ago. Center of chest. He is also complaining of being dizzy when he stands up which started 6 months ago. Admits to drinking alcohol tonight.    Allergies Allergies  Allergen Reactions   Celebrex [Celecoxib] Anaphylaxis   Aspirin Other (See Comments)    Medication is contraindicated with pts gout medicine.     Ibuprofen Other (See Comments)    Reaction:  GI bleeding    Toradol [Ketorolac Tromethamine] Other (See Comments)    Reaction:  Blisters between fingers    Tramadol Hives         Level of Care/Admitting Diagnosis ED Disposition     ED Disposition  Admit   Condition  --   Comment  Hospital Area: [facility] [[id number]]  Level of Care: Telemetry [5]  Covid Evaluation: Asymptomatic - no recent exposure (last 10 days) testing not required  Diagnosis: Symptomatic anemia [[id number]]  Admitting Physician: Frankey Shown [[id number]]  Attending Physician: Frankey Shown [[id number]]          B Medical/Surgery History Past Medical History:  Diagnosis Date   Aortic aneurysm (HCC) 2019   Chronic back pain    Cold    recent rx   Coronary artery disease    Degeneration of lumbar intervertebral disc    Diabetes mellitus    GERD (gastroesophageal reflux disease)    Gout    History of kidney stones    Hypertension    Pneumonia    Past Surgical History:  Procedure Laterality Date   BACK SURGERY     IRRIGATION AND DEBRIDEMENT ABSCESS N/A 07/15/2019   Procedure: IRRIGATION AND DRAINAGE OF PERINEAL ABSCESS;  Surgeon: Franky Macho, MD;  Location:  [facility];  Service: General;  Laterality: N/A;   MAXIMUM ACCESS (MAS)POSTERIOR LUMBAR INTERBODY FUSION (PLIF) 1 LEVEL  11/20/2013   Procedure: FOR MAXIMUM ACCESS (MAS) POSTERIOR LUMBAR INTERBODY FUSION LUMBAR FIVE-SACRAL ONE;  Surgeon: Tia Alert, MD;  Location: [facility];  Service: Neurosurgery;;  FOR MAXIMUM ACCESS (MAS) POSTERIOR LUMBAR INTERBODY FUSION LUMBAR FIVE-SACRAL ONE   TONSILLECTOMY       A IV Location/Drains/Wounds Patient Lines/Drains/Airways Status     Active Line/Drains/Airways     Name Placement date Placement time Site Days   Peripheral IV 09/18/23 20 G 1" Anterior;Proximal;Right Forearm 09/18/23  0130  Forearm  less than 1   Incision (Closed) 07/15/19 Perineum Right 07/15/19  0956  -- 1526            Intake/Output Last 24 hours No intake or output data in the 24 hours ending 09/18/23 0157  Labs/Imaging Results for orders placed or performed during the hospital encounter of 09/17/23 (from the past 48 hour(s))  Comprehensive metabolic panel     Status: Abnormal   Collection Time: 09/17/23 11:25 PM  Result Value Ref Range   Sodium 133 (L) 135 - 145 mmol/L   Potassium 3.8 3.5 - 5.1 mmol/L   Chloride 99 98 - 111 mmol/L   CO2 21 (L) 22 - 32 mmol/L   Glucose, Bld 173 (H) 70 - 99 mg/dL    Comment: Glucose  reference range applies only to samples taken after fasting for at least 8 hours.   BUN 17 8 - 23 mg/dL   Creatinine, Ser 9.52 (H) 0.61 - 1.24 mg/dL   Calcium 8.5 (L) 8.9 - 10.3 mg/dL   Total Protein 6.1 (L) 6.5 - 8.1 g/dL   Albumin 3.3 (L) 3.5 - 5.0 g/dL   AST 18 15 - 41 U/L   ALT 14 0 - 44 U/L   Alkaline Phosphatase 76 38 - 126 U/L   Total Bilirubin 0.6 0.3 - 1.2 mg/dL   GFR, Estimated 47 (L) >60 mL/min    Comment: (NOTE) Calculated using the CKD-EPI Creatinine Equation (2021)    Anion gap 13 5 - 15    Comment: Performed at [facility], [address], [place], [place] [address]  Ethanol     Status: Abnormal   Collection Time: 09/17/23 11:25 PM   Result Value Ref Range   Alcohol, Ethyl (B) 120 (H) <10 mg/dL    Comment: (NOTE) Lowest detectable limit for serum alcohol is 10 mg/dL.  For medical purposes only. Performed at [facility], [address] [place], [place] [address]   CBC with Differential     Status: Abnormal   Collection Time: 09/17/23 11:25 PM  Result Value Ref Range   WBC 6.2 4.0 - 10.5 K/uL   RBC 3.01 (L) 4.22 - 5.81 MIL/uL   Hemoglobin 6.6 (LL) 13.0 - 17.0 g/dL    Comment: REPEATED TO VERIFY THIS CRITICAL RESULT HAS VERIFIED AND BEEN CALLED TO Deira Shimer,L BY JAMIE WOODIE ON 09 23 2024 AT 0002, AND HAS BEEN READ BACK.     HCT 23.5 (L) 39.0 - 52.0 %   MCV 78.1 (L) 80.0 - 100.0 fL   MCH 21.9 (L) 26.0 - 34.0 pg   MCHC 28.1 (L) 30.0 - 36.0 g/dL   RDW 27.2 (H) 53.6 - 64.4 %   Platelets 322 150 - 400 K/uL   nRBC 0.0 0.0 - 0.2 %   Neutrophils Relative % 61 %   Neutro Abs 3.8 1.7 - 7.7 K/uL   Lymphocytes Relative 24 %   Lymphs Abs 1.5 0.7 - 4.0 K/uL   Monocytes Relative 10 %   Monocytes Absolute 0.6 0.1 - 1.0 K/uL   Eosinophils Relative 4 %   Eosinophils Absolute 0.3 0.0 - 0.5 K/uL   Basophils Relative 1 %   Basophils Absolute 0.1 0.0 - 0.1 K/uL   Immature Granulocytes 0 %   Abs Immature Granulocytes 0.02 0.00 - 0.07 K/uL    Comment: Performed at [facility], [address] [place], [place] [address]  Troponin I (High Sensitivity)     Status: None   Collection Time: 09/17/23 11:25 PM  Result Value Ref Range   Troponin I (High Sensitivity) 8 <18 ng/L    Comment: (NOTE) Elevated high sensitivity troponin I (hsTnI) values and significant  changes across serial measurements may suggest ACS but many other  chronic and acute conditions are known to elevate hsTnI results.  Refer to the "Links" section for chest pain algorithms and additional  guidance. Performed at [facility], [address] [place], [place] [address]   Urine rapid drug screen (hosp performed)     Status: Abnormal   Collection Time:  09/17/23 11:54 PM  Result Value Ref Range   Opiates NONE DETECTED NONE DETECTED   Cocaine POSITIVE (A) NONE DETECTED   Benzodiazepines NONE DETECTED NONE DETECTED   Amphetamines NONE DETECTED NONE DETECTED   Tetrahydrocannabinol NONE  DETECTED NONE DETECTED   Barbiturates NONE DETECTED NONE DETECTED    Comment: (NOTE) DRUG SCREEN FOR MEDICAL PURPOSES ONLY.  IF CONFIRMATION IS NEEDED FOR ANY PURPOSE, NOTIFY LAB WITHIN 5 DAYS.  LOWEST DETECTABLE LIMITS FOR URINE DRUG SCREEN Drug Class                     Cutoff (ng/mL) Amphetamine and metabolites    1000 Barbiturate and metabolites    200 Benzodiazepine                 200 Opiates and metabolites        300 Cocaine and metabolites        300 THC                            50 Performed at [facility], [address], [place], [place] [address]   ABO/Rh     Status: None   Collection Time: 09/17/23 11:55 PM  Result Value Ref Range   ABO/RH(D)      O POS Performed at [facility], [address] [place], [place] [address]   Type and screen [facility]     Status: None (Preliminary result)   Collection Time: 09/18/23 12:27 AM  Result Value Ref Range   ABO/RH(D) O POS    Antibody Screen NEG    Sample Expiration 09/21/2023,2359    Unit Number [id number]    Blood Component Type RED CELLS,LR    Unit division 00    Status of Unit ISSUED    Transfusion Status OK TO TRANSFUSE    Crossmatch Result      Compatible Performed at [facility], [address] [place], [place] [address]    Unit Number [id number]    Blood Component Type RED CELLS,LR    Unit division 00    Status of Unit ALLOCATED    Transfusion Status OK TO TRANSFUSE    Crossmatch Result Compatible   Folate     Status: Abnormal   Collection Time: 09/18/23 12:27 AM  Result Value Ref Range   Folate 5.0 (L) >5.9 ng/mL    Comment: Performed at [facility], [address] [place], [place] [address]  Reticulocytes     Status: Abnormal   Collection Time:  09/18/23 12:27 AM  Result Value Ref Range   Retic Ct Pct 2.7 0.4 - 3.1 %   RBC. 3.05 (L) 4.22 - 5.81 MIL/uL   Retic Count, Absolute 82.7 19.0 - 186.0 K/uL   Immature Retic Fract 33.9 (H) 2.3 - 15.9 %    Comment: Performed at [facility], [address], [place], [place] [address]  Prepare RBC (crossmatch)     Status: None   Collection Time: 09/18/23 12:27 AM  Result Value Ref Range   Order Confirmation      ORDER PROCESSED BY BLOOD BANK Performed at [facility], [address] [place], [place] [address]    DG Chest Port 1 View  Result Date: 09/17/2023 CLINICAL DATA:  Near syncope chest pain EXAM: PORTABLE CHEST 1 VIEW COMPARISON:  07/03/2018 FINDINGS: The heart size and mediastinal contours are within normal limits. Both lungs are clear. The visualized skeletal structures are unremarkable. IMPRESSION: No active disease. Electronically Signed   By: Jasmine Pang M.D.   On: 09/17/2023 23:59    Pending Labs Unresulted Labs (From admission, onward)     Start     Ordered   09/18/23  0017  Vitamin B12  (Anemia Panel (PNL))  Once,   URGENT        09/18/23 0016   09/18/23 0017  Iron and TIBC  (Anemia Panel (PNL))  Once,   URGENT        09/18/23 0016   09/18/23 0017  Ferritin  (Anemia Panel (PNL))  Once,   URGENT        09/18/23 0016            Vitals/Pain Today's Vitals   09/18/23 0030 09/18/23 0045 09/18/23 0137 09/18/23 0152  BP: 126/60 131/75 138/86 (!) 136/102  Pulse: 98 (!) 104 96 (!) 101  Resp: (!) 21 20 20 20   Temp:  98.3 F (36.8 C) 98.3 F (36.8 C) 98.2 F (36.8 C)  TempSrc:   Oral Oral  SpO2: 99% 97%  96%  Weight:      Height:      PainSc:    0-No pain    Isolation Precautions No active isolations  Medications Medications  0.9 %  sodium chloride infusion (Manually program via Guardrails IV Fluids) ( Intravenous New Bag/Given 09/18/23 0142)  oxyCODONE-acetaminophen (PERCOCET/ROXICET) 5-325 MG per tablet 1 tablet (1 tablet Oral Given 09/18/23 0045)     Mobility walks     Focused Assessments Cardiac Assessment Handoff:  Cardiac Rhythm: Normal sinus rhythm Lab Results  Component Value Date   TROPONINI <0.03 08/22/2017   Lab Results  Component Value Date   DDIMER 0.34 02/19/2016   Does the Patient currently have chest pain? No    R Recommendations: See Admitting Provider Note  Report given to:   Additional Notes: pt complaining of chest tightness across the top of his chest. A&O x 4, has been dizzy for the last 6 months off and on. HGB is 6.6 and first unit of blood is hanging.

## 2023-09-18 NOTE — Evaluation (Signed)
Physical Therapy Evaluation Patient Details Name: Thomas Donovan MRN: [id number] DOB: 04/18/1961 Today's Date: 09/18/2023  History of Present Illness  Thomas Donovan is a 62 y.o. male with medical history significant of A-fib with RVR, type 2 diabetes mellitus, chronic kidney disease stage III A, OSA (noncompliant with CPAP), polysubstance abuse (cocaine/alcohol), obesity who presents to the emergency department due to 6 to 8 months onset of sensation of dizziness mostly when he gets up to walk and this has been progressively worsening.  He complained of midsternal chest pain which started about 5 days ago.  Chest pain PTA was associated with nausea, diaphoresis, shortness of breath.  He admits to using cocaine and drinking alcohol PTA.   He complained of persistent blood in urine about 1.5 years ago, this lasted for a few months and self resolved, this reoccurred about 2 months ago for a few weeks, he states that he was was told at an ER that he may have prostate cancer, however, this was never worked up, nor had he been to any urologist.  He endorsed his urine being pink-tinged within the last 3 to 4 days.  He complained of urinary hesitancy and dribbling on urination.  Patient also endorsed of bright red blood per rectum whenever he takes NSAIDs.  Last episode was about 6 months ago.  Last colonoscopy was about 9 years ago, 2 large polyps were removed by patient, he was asked to return in 2 years, but he never followed up.   Clinical Impression  Patient demonstrates good return for transfers and ambulating in room/hallway without requiring use of an AD, but has to take frequent rest breaks and had to lean on wall to rest due to c/o SOB and fatigue.  Patient on room air with SpO2 at 95%, no c/o dizziness, HR increased to 139 BPM during ambulation and tolerated sitting up in chair after therapy with HR at normal level - nurse notified.  Patient will benefit from continued skilled physical therapy in  hospital and recommended venue below to increase strength, balance, endurance for safe ADLs and gait.       If plan is discharge home, recommend the following: A little help with walking and/or transfers;Help with stairs or ramp for entrance;Assistance with cooking/housework   Can travel by private vehicle        Equipment Recommendations None recommended by PT  Recommendations for Other Services       Functional Status Assessment Patient has had a recent decline in their functional status and demonstrates the ability to make significant improvements in function in a reasonable and predictable amount of time.     Precautions / Restrictions Precautions Precautions: Fall Restrictions Weight Bearing Restrictions: No      Mobility  Bed Mobility Overal bed mobility: Modified Independent                  Transfers Overall transfer level: Independent                      Ambulation/Gait Ambulation/Gait assistance: Modified independent (Device/Increase time), Supervision Gait Distance (Feet): 85 Feet Assistive device: None Gait Pattern/deviations: Decreased step length - right, Decreased step length - left, Decreased stride length Gait velocity: decreased     General Gait Details: slow slightly labored cadence required standing rest break leaning on wall for support before returning to room due to c/o fatigue, SOB, with HR increasing to 139 bpm  Stairs  Wheelchair Mobility     Tilt Bed    Modified Rankin (Stroke Patients Only)       Balance Overall balance assessment: Mild deficits observed, not formally tested                                           Pertinent Vitals/Pain Pain Assessment Pain Assessment: Faces Faces Pain Scale: Hurts a little bit Pain Location: back, legs Pain Descriptors / Indicators: Discomfort Pain Intervention(s): Limited activity within patient's tolerance, Monitored during session,  Premedicated before session, Repositioned    Home Living Family/patient expects to be discharged to:: Private residence Living Arrangements: Spouse/significant other Available Help at Discharge: Family;Available 24 hours/day Type of Home: House Home Access: Level entry       Home Layout: One level Home Equipment: None Additional Comments: Pt is living at a friends house currently.    Prior Function Prior Level of Function : Independent/Modified Independent             Mobility Comments: Community ambulator without AD other than in the grocery store where pt uses a scooter due to dizziness. Drives ADLs Comments: Independent     Extremity/Trunk Assessment   Upper Extremity Assessment Upper Extremity Assessment: Defer to OT evaluation    Lower Extremity Assessment Lower Extremity Assessment: Overall WFL for tasks assessed;Generalized weakness    Cervical / Trunk Assessment Cervical / Trunk Assessment: Normal  Communication   Communication Communication: No apparent difficulties  Cognition Arousal: Alert Behavior During Therapy: WFL for tasks assessed/performed Overall Cognitive Status: Within Functional Limits for tasks assessed                                          General Comments      Exercises     Assessment/Plan    PT Assessment Patient needs continued PT services  PT Problem List Decreased activity tolerance;Decreased strength;Decreased balance;Decreased mobility       PT Treatment Interventions DME instruction;Gait training;Stair training;Functional mobility training;Therapeutic activities;Therapeutic exercise;Balance training;Patient/family education    PT Goals (Current goals can be found in the Care Plan section)  Acute Rehab PT Goals Patient Stated Goal: return home PT Goal Formulation: With patient Time For Goal Achievement: 09/21/23 Potential to Achieve Goals: Good    Frequency Min 2X/week     Co-evaluation  PT/OT/SLP Co-Evaluation/Treatment: Yes Reason for Co-Treatment: To address functional/ADL transfers PT goals addressed during session: Mobility/safety with mobility;Balance         AM-PAC PT "6 Clicks" Mobility  Outcome Measure Help needed turning from your back to your side while in a flat bed without using bedrails?: None Help needed moving from lying on your back to sitting on the side of a flat bed without using bedrails?: None Help needed moving to and from a bed to a chair (including a wheelchair)?: None Help needed standing up from a chair using your arms (e.g., wheelchair or bedside chair)?: None Help needed to walk in hospital room?: A Little Help needed climbing 3-5 steps with a railing? : A Little 6 Click Score: 22    End of Session   Activity Tolerance: Patient tolerated treatment well;Patient limited by fatigue Patient left: in chair;with call bell/phone within reach Nurse Communication: Mobility status PT Visit Diagnosis: Unsteadiness on feet (R26.81);Other abnormalities of  gait and mobility (R26.89);Muscle weakness (generalized) (M62.81)    Time: 5284-1324 PT Time Calculation (min) (ACUTE ONLY): 20 min   Charges:   PT Evaluation $PT Eval Moderate Complexity: 1 Mod PT Treatments $Gait Training: 8-22 mins PT General Charges $$ ACUTE PT VISIT: 1 Visit         2:08 PM, 09/18/23 Ocie Bob, MPT Physical Therapist with [facility] 336 [phone] office [phone] mobile phone

## 2023-09-18 NOTE — Progress Notes (Signed)
   09/18/23 0636  Vitals  Temp 97.9 F (36.6 C)  Temp Source Oral  BP (!) 147/82  MAP (mmHg) 101  BP Location Left Arm  BP Method Automatic  Patient Position (if appropriate) Lying  Pulse Rate (!) 127  Pulse Rate Source Monitor  Level of Consciousness  Level of Consciousness Alert  MEWS COLOR  MEWS Score Color Yellow  MEWS Score  MEWS Temp 0  MEWS Systolic 0  MEWS Pulse 2  MEWS RR 0  MEWS LOC 0  MEWS Score 2   Skylar,LPN called this nurse to bedside, patient broken out in sweat, tachycardic, and SOB while blood transfusing, transfusion reaction suspected, blood stopped. Blood bank notified, MD Adefeso aware

## 2023-09-18 NOTE — Progress Notes (Signed)
*  PRELIMINARY RESULTS* Echocardiogram 2D Echocardiogram has been performed with Definity.  Stacey Drain 09/18/2023, 3:52 PM

## 2023-09-18 NOTE — Progress Notes (Signed)
Patient requested pain medication due to pain 10/10 given PRN, see MAR. Patient stated he wanted the pain medication prior to getting the Ativan, patient given ativan at 1535. Noted patient sleepy and having labored breathing. RT called to place CPAP on patient. Vital signs: T-97.8, P-96, R-19, BP-148/78, O2-93% at 2 liters. Noted once CPAP placed breathing normal and non-labored. MD Lucianne Muss made aware.

## 2023-09-18 NOTE — Plan of Care (Signed)

## 2023-09-18 NOTE — Progress Notes (Signed)
Noted IV pole tubing was rearranged on IV pole, patient irritable yelling and cursing at staff regarding meals and not receiving medications at request. Patient informed that he could not receive pain medication at the time it was requested, informed patient regarding the next dosage time. Noted patient sweating and reported he drinks 12 beers and liquor sometimes. MD Lucianne Muss made aware.

## 2023-09-18 NOTE — Progress Notes (Signed)
Patient c/o pain. 10/10. In mid lower back and bilateral legs. MD Thomes Dinning made aware. Received PRN pain medications. See MAR.   Patient c/o itching, states the itching started before he was given blood in the ED. Requesting benadryl. MD Thomes Dinning made aware. Per MD going to hold off on benadryl at this time.  Telemetry called, patient had 5 beat run of Vtach. MD Thomes Dinning made aware.

## 2023-09-18 NOTE — Consult Note (Addendum)
Cardiology Consultation   Patient ID: CHIBUIKE MCCAY MRN: [id number]; DOB: Mar 08, 1961  Admit date: 09/17/2023 Date of Consult: 09/18/2023  PCP:  Carmel Sacramento, NP   [facility] Providers Cardiologist:  [facility]   Patient Profile:   Thomas Donovan is a 62 y.o. male with a hx of tobacco use, diabetes type 2, CKD stage III, OSA noncompliant with CPAP, cocaine use, paroxysmal A-fib, hypertension, dyslipidemia, obesity, COPD, noncompliance, leaving AMA who is being seen 09/18/2023 for the evaluation of atrial fibrillation with RVR at the request of Dr. Thomasena Edis.  History of Present Illness:   [name] was seen in 2018 during an admission for chest pain and syncope.  Troponin levels were normal.  Echo showed normal pump function and Myoview Lexiscan was low risk.    Patient diagnosed with A-fib in 2022.  He was admitted [facility] with shortness of breath, chest pain and pressure after using cocaine found to have A-fib RVR.  He was started on rate control medication and Eliquis.  Echo showed normal LVSF, mild to moderate left atrial dilation.  He was seen in the outpatient setting and was not taking his medications as prescribed.  Started following with [facility] cardiology in 2022.  He was seen for paroxysmal A-fib with a CHA2DS2-VASc of 1.  He was treated with Coreg and Eliquis.  Myoview Lexiscan showed no reversible ischemia or infarction, normal LV function, low risk test.  [facility] patient has been seen in the ER multiple times for A-fib RVR.  Most recent echo 04/15/2023 ordered for elevated BNP showed EF greater than 55%, left atrium moderately dilated, normal RV SF, moderately dilated right atrium.  Patient presented to the ER [facility] with dizziness, chest pain, shortness of breath. He reported worsening symptoms over the last few weeks. Admits to missing doses of his diltiazem and sometimes Eliquis. He says chest pain is constant and pressure-like. He says he feels bloated. No  recent fever or chills. He denied overt bleeding. He admitted to using cocaine and drinking alcohol prior to admission. He says his sone died recently and the patient is trying to figure out housing for himself.   In the ER blood pressure 122/90, he was tachycardic, tachypneic.  EKG showed A-fib with a heart rate of 102 bpm.  Labs showed hemoglobin 6.6, hematocrit 23.5, platelets 322, sodium 133, bicarb 21, blood glucose 173, serum creatinine 1.65, albumin 3.3, alcohol level 120, UDS positive for cocaine, high-sensitivity troponin x 2 negative.  FOBT was positive.  Chest x-ray was unremarkable.  He was given 2 units of PRBCs in the ED. The patient was admitted for further work-up.    Past Medical History:  Diagnosis Date   Aortic aneurysm (HCC) 2019   Chronic back pain    Cold    recent rx   Coronary artery disease    Degeneration of lumbar intervertebral disc    Diabetes mellitus    GERD (gastroesophageal reflux disease)    Gout    History of kidney stones    Hypertension    Pneumonia     Past Surgical History:  Procedure Laterality Date   BACK SURGERY     IRRIGATION AND DEBRIDEMENT ABSCESS N/A 07/15/2019   Procedure: IRRIGATION AND DRAINAGE OF PERINEAL ABSCESS;  Surgeon: Franky Macho, MD;  Location: [facility] ORS;  Service: General;  Laterality: N/A;   MAXIMUM ACCESS (MAS)POSTERIOR LUMBAR INTERBODY FUSION (PLIF) 1 LEVEL  11/20/2013   Procedure: FOR MAXIMUM ACCESS (MAS) POSTERIOR LUMBAR INTERBODY FUSION LUMBAR  FIVE-SACRAL ONE;  Surgeon: Tia Alert, MD;  Location: [facility] NEURO ORS;  Service: Neurosurgery;;  FOR MAXIMUM ACCESS (MAS) POSTERIOR LUMBAR INTERBODY FUSION LUMBAR FIVE-SACRAL ONE   TONSILLECTOMY       Home Medications:  Prior to Admission medications   Medication Sig Start Date End Date Taking? Authorizing Provider  albuterol (VENTOLIN HFA) 108 (90 Base) MCG/ACT inhaler Inhale 2 puffs into the lungs every 6 (six) hours as needed for pain. 06/21/19  Yes [provider]   colchicine 0.6 MG tablet Take 1-2 tablets (0.6-1.2 mg total) by mouth 2 (two) times daily as needed (for gout flares). 08/23/17  Yes Standley Brooking, MD  diclofenac Sodium (VOLTAREN) 1 % GEL Apply 2 g topically 4 (four) times daily as needed (back pain). 08/18/23  Yes [provider]  diltiazem (CARDIZEM) 30 MG tablet Take 30 mg by mouth 4 (four) times daily. 08/01/23  Yes [provider]  ELIQUIS 5 MG TABS tablet Take 5 mg by mouth 2 (two) times daily. 08/14/23  Yes [provider]  esomeprazole (NEXIUM) 40 MG capsule Take 40 mg by mouth daily. 07/31/23  Yes [provider]  fenofibrate micronized (LOFIBRA) 134 MG capsule Take 134 mg by mouth daily. 07/31/23  Yes [provider]  furosemide (LASIX) 40 MG tablet Take 40 mg by mouth daily. 09/14/23  Yes [provider]  gabapentin (NEURONTIN) 300 MG capsule Take 600 mg by mouth 3 (three) times daily.   Yes [provider]  glipiZIDE (GLUCOTROL) 10 MG tablet Take 20 mg by mouth daily. 07/31/23  Yes [provider]  metFORMIN (GLUCOPHAGE) 850 MG tablet Take 850 mg by mouth every morning.   Yes [provider]  RYBELSUS 14 MG TABS Take 1 tablet by mouth daily. 08/04/23  Yes [provider]  TRELEGY ELLIPTA 100-62.5-25 MCG/ACT AEPB Inhale 1 puff into the lungs daily. 07/31/23  Yes [provider]    Inpatient Medications: Scheduled Meds:  cyanocobalamin  1,000 mcg Intramuscular Daily   Followed by   Melene Muller ON 09/25/2023] vitamin B-12  1,000 mcg Oral Daily   diltiazem  30 mg Oral Q6H   feeding supplement (GLUCERNA SHAKE)  237 mL Oral TID BM   folic acid  1 mg Oral Daily   insulin aspart  0-9 Units Subcutaneous Q4H   isosorbide mononitrate  30 mg Oral Daily   pantoprazole (PROTONIX) IV  40 mg Intravenous Q12H   tamsulosin  0.4 mg Oral Daily   Continuous Infusions:  ferric gluconate (FERRLECIT) IVPB     PRN Meds: acetaminophen **OR** acetaminophen,  diphenhydrAMINE-zinc acetate, HYDROcodone-acetaminophen, HYDROmorphone (DILAUDID) injection, ondansetron **OR** ondansetron (ZOFRAN) IV  Allergies:    Allergies  Allergen Reactions   Celebrex [Celecoxib] Anaphylaxis   Aspirin Other (See Comments)    Medication is contraindicated with pts gout medicine.     Ibuprofen Other (See Comments)    Reaction:  GI bleeding    Toradol [Ketorolac Tromethamine] Other (See Comments)    Reaction:  Blisters between fingers    Tramadol Hives         Social History:   Social History   Socioeconomic History   Marital status: Married    Spouse name: Not on file   Number of children: Not on file   Years of education: Not on file   Highest education level: Not on file  Occupational History   Not on file  Tobacco Use   Smoking status: Every Day    Current packs/day: 0.50  Average packs/day: 0.5 packs/day for 10.0 years (5.0 ttl pk-yrs)    Types: Cigarettes   Smokeless tobacco: Never  Vaping Use   Vaping status: Never Used  Substance and Sexual Activity   Alcohol use: Yes    Alcohol/week: 12.0 standard drinks of alcohol    Types: 12 Cans of beer per week    Comment: every 2 or 3 days   Drug use: No   Sexual activity: Yes  Other Topics Concern   Not on file  Social History Narrative   Not on file   Social Determinants of Health   Financial Resource Strain: Medium Risk (01/05/2023)   [facility], [facility]   Overall Financial Resource Strain (CARDIA)    Difficulty of Paying Living Expenses: Somewhat hard  Food Insecurity: No Food Insecurity (01/05/2023)   Received from [facility], [facility]   Hunger Vital Sign    Worried About Running Out of Food in the Last Year: Never true    Ran Out of Food in the Last Year: Never true  Transportation Needs: No Transportation Needs (01/05/2023)   [facility] from [facility], [facility]   [facility] - Transportation    Lack of Transportation (Medical): No     Lack of Transportation (Non-Medical): No  Physical Activity: Not on file  Stress: Not on file  Social Connections: Not on file  Intimate Partner Violence: Unknown (03/31/2022)   Received from [facility], [facility]   HITS    Physically Hurt: Not on file    Insult or Talk Down To: Not on file    Threaten Physical Harm: Not on file    Scream or Curse: Not on file    Family History:   History reviewed. No pertinent family history.   ROS:  Please see the history of present illness.   All other ROS reviewed and negative.     Physical Exam/Data:   Vitals:   09/18/23 0443 09/18/23 0459 09/18/23 0528 09/18/23 0636  BP: (!) 113/57 137/66 125/63 (!) 147/82  Pulse: (!) 119 (!) 125 (!) 110 (!) 127  Resp:  18 18   Temp: 98 F (36.7 C) 98.2 F (36.8 C) 97.6 F (36.4 C) 97.9 F (36.6 C)  TempSrc: Oral Oral Oral Oral  SpO2: 94% 95% 95%   Weight:      Height:        Intake/Output Summary (Last 24 hours) at 09/18/2023 0834 Last data filed at 09/18/2023 0449 Gross per 24 hour  Intake 864 ml  Output --  Net 864 ml      09/18/2023    2:25 AM 09/17/2023   11:05 PM 02/07/2021   11:31 AM  Last 3 Weights  Weight (lbs) 296 lb 14.4 oz 280 lb 280 lb  Weight (kg) 134.673 kg 127.007 kg 127.007 kg     Body mass index is 39.17 kg/m.  General:  Well nourished, well developed, in no acute distress HEENT: normal Neck: no JVD Vascular: No carotid bruits; Distal pulses 2+ bilaterally Cardiac:  normal S1, S2; Irreg Irreg; no murmur  Lungs:  clear to auscultation bilaterally, no wheezing, rhonchi or rales  Abd: soft, nontender, no hepatomegaly  Ext: no edema Musculoskeletal:  No deformities, BUE and BLE strength normal and equal Skin: warm and dry  Neuro:  CNs 2-12 intact, no focal abnormalities noted Psych:  Normal affect   EKG:  The EKG was personally reviewed and demonstrates: A-fib RVR, 102 bpm, minimal  ST depression in inferior leads, nonspecific T wave changes Telemetry:   Telemetry was personally reviewed and demonstrates:  Afib HR 110-120  Relevant CV Studies:  Echo 03/2023 Summary   1. Technically difficult study.    2. The left ventricle is not well visualized but probably normal in size  with normal wall thickness.    3. The left ventricular systolic function is normal, LVEF is visually  estimated at > 55%.    4. The left atrium is moderately dilated in size.    5. The right ventricle is mildly dilated in size, with normal systolic  function.   6. The right atrium is mildly to moderately dilated in size.    7. IVC size and inspiratory change suggest elevated right atrial pressure.  (10-20 mmHg).    8. Normal size aortic root.  Ascending aorta is dilated measuring 4.1 cm.    Myoview lexiscan 2022 1. No reversible ischemia or infarction.   2. Normal left ventricular wall motion.   3. Left ventricular ejection fraction 62%   4. Non invasive risk stratification*: Low   *2012 Appropriate Use Criteria for Coronary Revascularization  Focused Update: J Am Coll Cardiol. 2012;59(9):857-881.  http://content.dementiazones.com.aspx?articleid=1201161   This result has an attachment that is not available.   No significant ST segment changes or arrhythmias were noted during  stress   Non-diagnostic EKG portion of Lexiscan SPECT study   Image interpretation to follow per Radiology department dictated in a  separate report   Stress Findings  The patient underwent pharmacologic stress with regadenoson, 0.4 mg IV. - The patient experienced no angina during the stress test.   Baseline ECG  - Baseline ECG shows normal sinus rhythm, normal axis and non-specific ST T wave changes.   Stress ECG  - No significant ST segment changes or arrhythmias were noted during stress.   Echo 04/2021 Summary   1. Technically difficult study.    2. The left ventricle is normal in size with normal wall thickness.    3. The left ventricular systolic function is  normal, LVEF is visually  estimated at 65-70%.    4. There is grade I diastolic dysfunction (impaired relaxation).    5. The left atrium is mildly to moderately dilated in size.    6. The right ventricle is upper normal in size, with normal systolic  function.   Laboratory Data:  High Sensitivity Troponin:   Recent Labs  Lab 09/17/23 2325 09/18/23 0027  TROPONINIHS 8 9     Chemistry Recent Labs  Lab 09/17/23 2325 09/18/23 0722  NA 133* 136  K 3.8 4.2  CL 99 102  CO2 21* 23  GLUCOSE 173* 212*  BUN 17 18  CREATININE 1.65* 1.63*  CALCIUM 8.5* 8.6*  MG  --  1.8  GFRNONAA 47* 48*  ANIONGAP 13 11    Recent Labs  Lab 09/17/23 2325 09/18/23 0722  PROT 6.1* 6.3*  ALBUMIN 3.3* 3.3*  AST 18 17  ALT 14 14  ALKPHOS 76 80  BILITOT 0.6 1.2   Lipids No results for input(s): "CHOL", "TRIG", "HDL", "LABVLDL", "LDLCALC", "CHOLHDL" in the last 168 hours.  Hematology Recent Labs  Lab 09/17/23 2325 09/18/23 0027 09/18/23 0722  WBC 6.2  --  6.0  RBC 3.01* 3.05* 3.53*  HGB 6.6*  --  8.2*  HCT 23.5*  --  27.7*  MCV 78.1*  --  78.5*  MCH 21.9*  --  23.2*  MCHC 28.1*  --  29.6*  RDW  19.7*  --  19.2*  PLT 322  --  322   Thyroid No results for input(s): "TSH", "FREET4" in the last 168 hours.  BNPNo results for input(s): "BNP", "PROBNP" in the last 168 hours.  DDimer No results for input(s): "DDIMER" in the last 168 hours.   Radiology/Studies:  DG Chest Port 1 View  Result Date: 09/17/2023 CLINICAL DATA:  Near syncope chest pain EXAM: PORTABLE CHEST 1 VIEW COMPARISON:  07/03/2018 FINDINGS: The heart size and mediastinal contours are within normal limits. Both lungs are clear. The visualized skeletal structures are unremarkable. IMPRESSION: No active disease. Electronically Signed   By: Jasmine Pang M.D.   On: 09/17/2023 23:59     Assessment and Plan:   Persistent Afib -Patient was admitted with symptomatic anemia, atypical chest pain with negative troponin, positive UDS  for cocaine, alcohol disorder asked to be seen for A-fib -Patient has a long history of A-fib with medication noncompliance and poor follow-up with multiple ER visits -He was previously followed at [facility] most recently previously on Eliquis and diltiazem 30 mg 4 times daily - unclear if he is mostly in afib -Rates exacerbated due to the above acute issues -Eliquis held for anemia. Overall, patient is not a good candidate for anticoagulation given to poor compliance and follow-up -Keep mag greater than 2 and K greater than 4 - rates still elevated, would consolidate diltiazem and increase the dose. Would be easier for compliance - check echo - will discuss a/c with MD  Symptomatic anemia -Hemoglobin 6.6 on admission status post 2 units PRBCs -Eliquis PTA for A-fib held -Follow-up hemoglobin 8.2 - daily CBC - may need GI work-up - not a good candidate for long-term a/c given noncompliance  Chest pain -Atypical chest pain in the setting of symptomatic anemia, cocaine use and A-fib RVR -Troponin negative x 2 -Myoview Lexiscan in 2022 was low risk with no ischemia or infarction -Check echo - continue Imdur  HFpEF -Most recent echo 03/2023 showed EF greater than 55% -PTA ?Lasix 40 mg daily - difficult to assess volume, but may have extra volume on him - check BNP   CKD stage III - Scr 1.68, which is above baseline -Daily BMET  Cocaine use -UDS positive for cocaine  Alcohol use -Alcohol level was 120 -CIWA Per IM   For questions or updates, please contact [facility] Please consult [email or web address] for contact info under    Signed, Cadence Ardelle Lesches  09/18/2023 8:34 AM   Attending note   Patient seen and discussed with PA Further, I agree with her documentation. 62 yo male history of afib,  HTN, DM2, CKD 3, OSA, polysubtance abuse with EtoH/cocaine, presented with dizziness as well as episodes of chest pain. Admitted with Hgb of 6.6, also cocaine + on admission.  Chest pain feels like a fullness midchest/epigastric lasting hours at a time, feels like needs to belch to clear but can't resolve it.      K 3.8 BUN 17 Cr 1.65 EtOH 120 WBC 6.2 Hgb 6.6 Plt 322 ferritin 18 UDS + cocaine Trop 8-->9 EKG: EKG afib, no ischemic changes CXR: no acute process Echo pending   07/2017 nuclear stress: no ischemia 07/2017 echo: LVE 65-70%, grade I dd, limited visualization 07/2021 nculear stress [facility]: no ischemia 03/2023 echo [facility]: LVE >55%, no significant valve pathology      1.Chest pain - long history of chest pain - Hgb 6.6 on presentation, cocaine + -2018 nuclear stress without ischemia. Discussions at  that time to consider cath but patient declined. Never followed up with cards as outpatient  - 2022 nuclear stress [facility]: No ischemia - current symptoms are not cardiac, fullness midchest/epigastric lasting hours a time.   - in general not a cath candidate given recurrent transfusion dependent anemia. Very clear alternative etiologies for chest pain with severe anemia, cocaine +. Nuclear stress testing has been negative twice within the last 6 years. Recent chest pains noncardiac in description - no plans for repeat ishcemic testing at this time, f/u echo results.     2.Anemia - Hgb 6.6 on presentation - from [facility] notes multiple prior admits with anemia and multiple transfusions - reports hematuria, intermittent bright red blood per rectum as well - ferritin 18. Hemoccult + in ER.    3. Afib - prior history of afib per [facility] notes, has been on eliquis - admitted with symptomatic anemia Hgb 6.6, not an anticoag candidate at this time.  - would stay off eliquis given recurrent anemia - if establishes with consitent f/u could possibly consider watchman evaluation as outpatient.   - rates elevated in setting of severe anemia, Home dilt just restarted this AM, home regimen has been 30mg  every 6 hours. Follow rates, can consoldiate to long acting pending  hemodynamics    4.CKD - admit Cr 1.65 and stable  5. Chronic HFpEF - 03/2023 echo [facility]: LVE >55%, no significant valve pathology - monitor volume status with transfusions. Difficult to assess by exma, bnp is pending. Consider reds vest tomorrow if starts to show signs of fluid overload    Dina Rich MD

## 2023-09-19 ENCOUNTER — Telehealth: Payer: Self-pay | Admitting: Gastroenterology

## 2023-09-19 DIAGNOSIS — D649 Anemia, unspecified: Secondary | ICD-10-CM | POA: Diagnosis not present

## 2023-09-19 DIAGNOSIS — R0789 Other chest pain: Secondary | ICD-10-CM | POA: Diagnosis not present

## 2023-09-19 DIAGNOSIS — I4891 Unspecified atrial fibrillation: Secondary | ICD-10-CM | POA: Diagnosis not present

## 2023-09-19 LAB — TYPE AND SCREEN
ABO/RH(D): O POS
Antibody Screen: NEGATIVE
Unit division: 0
Unit division: 0

## 2023-09-19 LAB — CBC
HCT: 29.8 % — ABNORMAL LOW (ref 39.0–52.0)
Hemoglobin: 8.7 g/dL — ABNORMAL LOW (ref 13.0–17.0)
MCH: 23.5 pg — ABNORMAL LOW (ref 26.0–34.0)
MCHC: 29.2 g/dL — ABNORMAL LOW (ref 30.0–36.0)
MCV: 80.3 fL (ref 80.0–100.0)
Platelets: 357 10*3/uL (ref 150–400)
RBC: 3.71 MIL/uL — ABNORMAL LOW (ref 4.22–5.81)
RDW: 19.6 % — ABNORMAL HIGH (ref 11.5–15.5)
WBC: 7.3 10*3/uL (ref 4.0–10.5)
nRBC: 0 % (ref 0.0–0.2)

## 2023-09-19 LAB — GLUCOSE, CAPILLARY
Glucose-Capillary: 163 mg/dL — ABNORMAL HIGH (ref 70–99)
Glucose-Capillary: 169 mg/dL — ABNORMAL HIGH (ref 70–99)
Glucose-Capillary: 197 mg/dL — ABNORMAL HIGH (ref 70–99)
Glucose-Capillary: 206 mg/dL — ABNORMAL HIGH (ref 70–99)
Glucose-Capillary: 238 mg/dL — ABNORMAL HIGH (ref 70–99)
Glucose-Capillary: 255 mg/dL — ABNORMAL HIGH (ref 70–99)

## 2023-09-19 LAB — BPAM RBC
Blood Product Expiration Date: 202410122359
Blood Product Expiration Date: 202410122359
ISSUE DATE / TIME: 202409230133
ISSUE DATE / TIME: 202409230507
Unit Type and Rh: 5100
Unit Type and Rh: 5100

## 2023-09-19 LAB — PHOSPHORUS: Phosphorus: 3.1 mg/dL (ref 2.5–4.6)

## 2023-09-19 LAB — BASIC METABOLIC PANEL
Anion gap: 10 (ref 5–15)
BUN: 16 mg/dL (ref 8–23)
CO2: 26 mmol/L (ref 22–32)
Calcium: 9.1 mg/dL (ref 8.9–10.3)
Chloride: 101 mmol/L (ref 98–111)
Creatinine, Ser: 1.41 mg/dL — ABNORMAL HIGH (ref 0.61–1.24)
GFR, Estimated: 57 mL/min — ABNORMAL LOW (ref >60)
Glucose, Bld: 177 mg/dL — ABNORMAL HIGH (ref 70–99)
Potassium: 3.9 mmol/L (ref 3.5–5.1)
Sodium: 137 mmol/L (ref 135–145)

## 2023-09-19 LAB — T3, FREE: T3, Free: 2.8 pg/mL (ref 2.0–4.4)

## 2023-09-19 LAB — MAGNESIUM: Magnesium: 1.6 mg/dL — ABNORMAL LOW (ref 1.7–2.4)

## 2023-09-19 MED ORDER — MAGNESIUM OXIDE -MG SUPPLEMENT 400 (240 MG) MG PO TABS
400.0000 mg | ORAL_TABLET | Freq: Two times a day (BID) | ORAL | Status: DC
  Administered 2023-09-20 – 2023-09-21 (×3): 400 mg via ORAL
  Filled 2023-09-19 (×3): qty 1

## 2023-09-19 MED ORDER — DILTIAZEM HCL 30 MG PO TABS
30.0000 mg | ORAL_TABLET | Freq: Once | ORAL | Status: AC
  Administered 2023-09-19: 30 mg via ORAL
  Filled 2023-09-19: qty 1

## 2023-09-19 MED ORDER — DILTIAZEM HCL 30 MG PO TABS
60.0000 mg | ORAL_TABLET | Freq: Three times a day (TID) | ORAL | Status: AC
  Administered 2023-09-19 – 2023-09-20 (×5): 60 mg via ORAL
  Filled 2023-09-19 (×5): qty 2

## 2023-09-19 MED ORDER — FUROSEMIDE 10 MG/ML IJ SOLN
INTRAMUSCULAR | Status: AC
  Filled 2023-09-19: qty 20

## 2023-09-19 MED ORDER — MAGNESIUM SULFATE 2 GM/50ML IV SOLN
2.0000 g | Freq: Once | INTRAVENOUS | Status: AC
  Administered 2023-09-19: 2 g via INTRAVENOUS
  Filled 2023-09-19: qty 50

## 2023-09-19 NOTE — Telephone Encounter (Signed)
Thomas Donovan : Patient is still currently admitted to the hospital, but GI service is signing off.  Please arrange hospital follow-up in 2 weeks with Thomas Loron, NP or myself for anemia and cirrhosis.

## 2023-09-19 NOTE — Progress Notes (Signed)
[facility] Progress Note  Patient: Thomas Donovan    HQI:[id number]  DOA: 09/17/2023     Date of Service: the patient was seen and examined on 09/19/2023  Chief Complaint  Patient presents with   Chest Pain   Brief hospital course: Thomas Donovan is a 62 y.o. male with medical history significant of A-fib with RVR, type 2 diabetes mellitus, chronic kidney disease stage III A, OSA (noncompliant with CPAP), polysubstance abuse (cocaine/alcohol), obesity who presents to the emergency department due to 6 to 8 months onset of sensation of dizziness mostly when he gets up to walk and this has been progressively worsening.  He complained of midsternal chest pain which started about 5 days ago.  Chest pain PTA was associated with nausea, diaphoresis, shortness of breath.  He admits to using cocaine and drinking alcohol PTA.  He complained of persistent blood in urine about 1.5 years ago, this lasted for a few months and self resolved, this reoccurred about 2 months ago for a few weeks, he states that he was was told at an ER that he may have prostate cancer, however, this was never worked up, nor had he been to any urologist.  He endorsed his urine being pink-tinged within the last 3 to 4 days.  He complained of urinary hesitancy and dribbling on urination. Patient also endorsed of bright red blood per rectum whenever he takes NSAIDs.  Last episode was about 6 months ago.  Last colonoscopy was about 9 years ago, 2 large polyps were removed by patient, he was asked to return in 2 years, but he never followed up. Patient states that he has had about 6 blood transfusions since December of last year.   ED Course: Pt was tachypneic, tachycardic, BP 122/90, other vital signs were within normal range.   Hb 6.6 (hemoglobin on 07/24/2023 was 7.4 -Care Everywhere), hematocrit 23.5, MCV 78.1, platelets 322.  BMP was normal except for sodium of 133, bicarb 21, glucose 173, creatinine 1.65 (baseline creatinine  at 1.5 - Care Everywhere), albumin 3.3, alcohol level 120, urine drug screen was positive for cocaine, troponin x 2 - 8 > 9, folate 5.0.  FOBT was positive per EDP. Chest x-ray showed no active disease Percocet was given.  1 units of PRBC, pt developed sweating so 2nd unit was given as per pt Hospitalist was asked to admit patient for further evaluation and management.   Assessment and Plan: Principal Problem:   Symptomatic anemia Active Problems:   Atypical chest pain   GERD (gastroesophageal reflux disease)   Uncontrolled type 2 diabetes mellitus with hyperglycemia, with long-term current use of insulin (HCC)   Acute on chronic blood loss anemia   GI bleed   Alcohol use disorder   Cocaine abuse (HCC)   Chronic kidney disease, stage 3a (HCC)   BPH (benign prostatic hyperplasia)   Hematuria   # Symptomatic anemia Acute on chronic blood loss anemia GI bleed Hemoglobin 6.6 (hemoglobin on 07/24/2023 was 7.4 -Care Everywhere) Hemoccult was positive per EDP Patient states that he takes Eliquis and also takes NSAIDs sometimes, these could have contributed to patient's GI bleed. S/p 1 U prbc, Hb 8.2 posttransfusion Continue IV Protonix 40 mg twice daily GI consulted, patient is not a candidate for scope at this time due to cocaine abuse, discussed with anesthesia.  GI will arrange EGD and colonoscopy as an outpatient. 9/24 Hb 8.7 stable  Iron deficiency, transferrin saturation 4% Ferric gluconate to 50 mg IV one-time dose  given Start oral iron supplement on discharge Follow with PCP to repeat iron profile after 3 to 6 months  Vitamin B12 deficiency, B12 level 166, goal >400, started vitamin B12 1000 mcg IM injection daily during hospital stay followed by oral supplement on discharge. Follow-up with PCP to repeat B12 level after 3 to 6 months.  Folic acid deficiency, started folic acid 1 mg p.o. daily.  Follow with PCP to repeat folic acid level after 3 to 6 months.   Chronic Atrial  Fibrillation with RVR Med rec shows patient was on Cardizem and Eliquis Resumed Cardizem 30 mg p.o. every 6 hourly Held Eliquis for now due to GI bleeding Follow cardiology consult for further recommendation TTE reviewed as below  Chronic HFpEF Continue total input/output, daily weights and fluid restriction Echocardiogram done in August, 2018 showed LVEF of 65 to 70%, G1 DD.   Significant edema of bilateral lower extremities, started Lasix IV infusion Monitor renal functions daily and urine output Follow TTE LVEF 60 to 65%, no wall motion abnormality.  Mild LV hypertrophy. Cardiology consulted as above  Atypical chest pain This may be related to cocaine abuse Troponin x 4 was normal He no longer complains of chest pain, though still complains of chronic back pain Started Imdur 30 mg p.o. daily  Liver cirrhosis Ultrasound abdomen shows liver cirrhosis Patient was recommended to follow-up with GI as an outpatient for possible liver transplant if he is able to quit alcohol for at least 6 months  Cocaine abuse Urine drug screen was positive for cocaine Patient was counseled on cocaine abuse cessation   Alcohol use disorder Alcohol level was 120, was counseled on alcohol abuse disorder Continue to monitor on CIWA protocol   CKD stage IIIA Creatinine 1.65 (baseline creatinine at 1.5 Renally adjust medications, avoid nephrotoxic agents/dehydration/hypotension   Hypoalbuminemia most likely secondary to liver cirrhosis Albumin 3.3, protein supplement will be provided   Chronic back pain Started Tylenol 650 mg p.o. 3 times daily scheduled, gabapentin 300 mg p.o. 3 times daily, Dilaudid oral and IV as needed Continue PT/OT eval and treat   Uncontrolled type 2 diabetes mellitus with hyperglycemia Last A1c in May 2023 was 7.0 -care everywhere Continue ISS and hypoglycemia protocol Metformin and glipizide will be held at this time Hemoglobin A1c will be checked   COPD, no  exacerbation noticed on exam Continue home inhalers OSA secondary to obesity, continue CPAP at nighttime Patient was recommended to follow-up with pulmonologist for sleep study as an outpatient  GERD Continue Protonix   BPH Patient states that he ran out of his Flomax a few days ago and has been having difficulty in urination Continue Flomax   Hematuria Patient reported history of hematuria.  Patient will need outpatient urology consult for further workup   Body mass index is 38.95 kg/m.  Interventions:  Diet: Heart healthy diet DVT Prophylaxis: SCD, pharmacological prophylaxis contraindicated due to GI bleeding     Advance goals of care discussion: Full code  Family Communication: family was not present at bedside, at the time of interview.  The pt provided permission to discuss medical plan with the family. Opportunity was given to ask question and all questions were answered satisfactorily.   Disposition:  Pt is from this community /Homeless, admitted with symptomatic anemia, and A-fib with RVR, lower extremity edema, started on Lasix IV infusion, which precludes a safe discharge. Discharge to home, when stable, may need few days to stay in the hospital..  Subjective: No significant events  overnight, patient has sleep apnea, he was using CPAP.  Lower extremity edema is getting better.  No alcohol withdrawal symptoms today.   Physical Exam: General: NAD, lying comfortably Appear in no distress, affect appropriate Eyes: PERRLA ENT: Oral Mucosa Clear, moist  Neck: no JVD,  Cardiovascular: S1 and S2 Present, no Murmur,  Respiratory: good respiratory effort, Bilateral Air entry equal and Decreased, no Crackles, no wheezes Abdomen: Bowel Sound present, Soft and no tenderness,  Skin: no rashes Extremities: 1-2+ Pedal edema, no calf tenderness Neurologic: without any new focal findings Gait not checked due to patient safety concerns  Vitals:   09/19/23 0740 09/19/23 0749  09/19/23 0750 09/19/23 0858  BP: (!) 147/86   (!) 142/82  Pulse: 88   86  Resp:      Temp:      TempSrc:      SpO2: 97% 97% 97%   Weight:      Height:        Intake/Output Summary (Last 24 hours) at 09/19/2023 1303 Last data filed at 09/19/2023 1015 Gross per 24 hour  Intake 512.31 ml  Output 300 ml  Net 212.31 ml   Filed Weights   09/17/23 2305 09/18/23 0225 09/19/23 0339  Weight: 127 kg 134.7 kg 133.9 kg    Data Reviewed: I have personally reviewed and interpreted daily labs, tele strips, imagings as discussed above. I reviewed all nursing notes, pharmacy notes, vitals, pertinent old records I have discussed plan of care as described above with RN and patient/family.  CBC: Recent Labs  Lab 09/17/23 2325 09/18/23 0722 09/18/23 1801 09/19/23 0417  WBC 6.2 6.0  --  7.3  NEUTROABS 3.8  --   --   --   HGB 6.6* 8.2* 8.4* 8.7*  HCT 23.5* 27.7* 29.1* 29.8*  MCV 78.1* 78.5*  --  80.3  PLT 322 322  --  357   Basic Metabolic Panel: Recent Labs  Lab 09/17/23 2325 09/18/23 0722 09/19/23 0417  NA 133* 136 137  K 3.8 4.2 3.9  CL 99 102 101  CO2 21* 23 26  GLUCOSE 173* 212* 177*  BUN 17 18 16   CREATININE 1.65* 1.63* 1.41*  CALCIUM 8.5* 8.6* 9.1  MG  --  1.8 1.6*  PHOS  --  3.7 3.1    Studies: ECHOCARDIOGRAM COMPLETE  Result Date: 09/18/2023    ECHOCARDIOGRAM REPORT   Patient Name:   Thomas Donovan Date of Exam: 09/18/2023 Medical Rec #:  [id number]        Height:       73.0 in Accession #:    [id number]       Weight:       296.9 lb Date of Birth:  10/14/1961         BSA:          2.545 m Patient Age:    62 years         BP:           126/84 mmHg Patient Gender: M                HR:           84 bpm. [facility] Procedure: 2D Echo, Cardiac Doppler and Color Doppler Indications:    Chest Pain R07.9  History:        Patient has prior history of Echocardiogram examinations, most  recent 08/22/2017. CAD, Arrythmias:Atrial Fibrillation,                  Signs/Symptoms:Syncope; Risk Factors:Diabetes, Tobacco use and                 Hypertension. Alcohol use disorder, Cocaine abuse (HCC).  Sonographer:    Celesta Gentile RCS Referring Phys: [id number] [facility]  Sonographer Comments: Technically difficult study due to poor echo windows. Image acquisition challenging due to patient body habitus. IMPRESSIONS  1. Left ventricular ejection fraction, by estimation, is 60 to 65%. The left ventricle has normal function. The left ventricle has no regional wall motion abnormalities. There is mild left ventricular hypertrophy. Left ventricular diastolic parameters are indeterminate.  2. Right ventricular systolic function is normal. The right ventricular size is moderately enlarged.  3. Left atrial size was mildly dilated.  4. Right atrial size was mildly dilated.  5. The mitral valve is normal in structure. Trivial mitral valve regurgitation. No evidence of mitral stenosis.  6. The aortic valve has an indeterminant number of cusps. Aortic valve regurgitation is not visualized. No aortic stenosis is present.  7. Aortic dilatation noted.  8. The inferior vena cava is dilated in size with <50% respiratory variability, suggesting right atrial pressure of 15 mmHg. FINDINGS  Left Ventricle: Left ventricular ejection fraction, by estimation, is 60 to 65%. The left ventricle has normal function. The left ventricle has no regional wall motion abnormalities. Definity contrast agent was given IV to delineate the left ventricular  endocardial borders. The left ventricular internal cavity size was normal in size. There is mild left ventricular hypertrophy. Left ventricular diastolic parameters are indeterminate. Right Ventricle: The right ventricular size is moderately enlarged. Right vetricular wall thickness was not well visualized. Right ventricular systolic function is normal. Left Atrium: Left atrial size was mildly dilated. Right Atrium: Right atrial size was mildly dilated.  Pericardium: There is no evidence of pericardial effusion. Mitral Valve: The mitral valve is normal in structure. Trivial mitral valve regurgitation. No evidence of mitral valve stenosis. Tricuspid Valve: The tricuspid valve is normal in structure. Tricuspid valve regurgitation is trivial. No evidence of tricuspid stenosis. Aortic Valve: The aortic valve has an indeterminant number of cusps. Aortic valve regurgitation is not visualized. No aortic stenosis is present. Aortic valve mean gradient measures 3.1 mmHg. Aortic valve peak gradient measures 5.8 mmHg. Aortic valve area, by VTI measures 2.97 cm. Pulmonic Valve: The pulmonic valve was not well visualized. Pulmonic valve regurgitation is not visualized. No evidence of pulmonic stenosis. Aorta: Aortic dilatation noted and the aortic root is normal in size and structure. Venous: The inferior vena cava is dilated in size with less than 50% respiratory variability, suggesting right atrial pressure of 15 mmHg. IAS/Shunts: The interatrial septum was not well visualized.  LEFT VENTRICLE PLAX 2D LVIDd:         5.10 cm LVIDs:         3.00 cm LV PW:         1.20 cm LV IVS:        1.20 cm LVOT diam:     2.20 cm LV SV:         65 LV SV Index:   25 LVOT Area:     3.80 cm  RIGHT VENTRICLE TAPSE (M-mode): 1.8 cm LEFT ATRIUM              Index        RIGHT ATRIUM  Index LA diam:        5.00 cm  1.96 cm/m   RA Area:     29.05 cm LA Vol (A2C):   76.9 ml  30.22 ml/m  RA Volume:   98.00 ml  38.51 ml/m LA Vol (A4C):   118.0 ml 46.37 ml/m LA Biplane Vol: 103.0 ml 40.47 ml/m  AORTIC VALVE AV Area (Vmax):    3.25 cm AV Area (Vmean):   2.99 cm AV Area (VTI):     2.97 cm AV Vmax:           120.64 cm/s AV Vmean:          82.375 cm/s AV VTI:            0.218 m AV Peak Grad:      5.8 mmHg AV Mean Grad:      3.1 mmHg LVOT Vmax:         103.00 cm/s LVOT Vmean:        64.800 cm/s LVOT VTI:          0.170 m LVOT/AV VTI ratio: 0.78  AORTA Ao Root diam: 4.00 cm MITRAL VALVE MV  Area (PHT): 3.53 cm     SHUNTS MV Decel Time: 215 msec     Systemic VTI:  0.17 m MV E velocity: 126.00 cm/s  Systemic Diam: 2.20 cm Dina Rich MD Electronically signed by Dina Rich MD Signature Date/Time: 09/18/2023/4:10:02 PM    Final     Scheduled Meds:  acetaminophen  650 mg Oral TID   cyanocobalamin  1,000 mcg Intramuscular Daily   Followed by   Melene Muller ON 09/25/2023] vitamin B-12  1,000 mcg Oral Daily   diltiazem  60 mg Oral TID   feeding supplement (GLUCERNA SHAKE)  237 mL Oral TID BM   fluticasone furoate-vilanterol  1 puff Inhalation Daily   And   umeclidinium bromide  1 puff Inhalation Daily   folic acid  1 mg Oral Daily   gabapentin  600 mg Oral TID   insulin aspart  0-9 Units Subcutaneous Q4H   isosorbide mononitrate  30 mg Oral Daily   [START ON 09/20/2023] magnesium oxide  400 mg Oral BID   multivitamin with minerals  1 tablet Oral Daily   pantoprazole (PROTONIX) IV  40 mg Intravenous Q12H   tamsulosin  0.4 mg Oral Daily   thiamine  100 mg Oral Daily   Or   thiamine  100 mg Intravenous Daily   Continuous Infusions:  furosemide (LASIX) 200 mg in dextrose 5 % 100 mL (2 mg/mL) infusion 6 mg/hr (09/19/23 0914)   PRN Meds: albuterol, diphenhydrAMINE-zinc acetate, HYDROmorphone (DILAUDID) injection, HYDROmorphone, LORazepam **OR** LORazepam, ondansetron **OR** ondansetron (ZOFRAN) IV  Time spent: 35 minutes  Author: Gillis Santa. MD [facility] 09/19/2023 1:03 PM  To reach On-call, see care teams to locate the attending and reach out to them via [email or web address]. If 7PM-7AM, please contact night-coverage If you still have difficulty reaching the attending provider, please page the [facility] (Director on Call) for [facility] on amion for assistance.

## 2023-09-19 NOTE — Progress Notes (Addendum)
Rounding Note    Patient Name: Thomas Donovan Date of Encounter: 09/19/2023  [facility] Cardiologist: None   Subjective   He was started on IV lasix. Kidney function improving. Mag 1.6. TSH 4.9. BNP 158. Hgb 8.7. Echo showed normal pump function. UOP ?not complete.  Patient is overall feeling better, but still SOB. He denies chest pain. He is in afib with rates mostly controlled.   Inpatient Medications    Scheduled Meds:  acetaminophen  650 mg Oral TID   cyanocobalamin  1,000 mcg Intramuscular Daily   Followed by   Melene Muller ON 09/25/2023] vitamin B-12  1,000 mcg Oral Daily   diltiazem  30 mg Oral Q6H   feeding supplement (GLUCERNA SHAKE)  237 mL Oral TID BM   fluticasone furoate-vilanterol  1 puff Inhalation Daily   And   umeclidinium bromide  1 puff Inhalation Daily   folic acid  1 mg Oral Daily   folic acid  1 mg Oral Daily   gabapentin  600 mg Oral TID   insulin aspart  0-9 Units Subcutaneous Q4H   isosorbide mononitrate  30 mg Oral Daily   multivitamin with minerals  1 tablet Oral Daily   pantoprazole (PROTONIX) IV  40 mg Intravenous Q12H   tamsulosin  0.4 mg Oral Daily   thiamine  100 mg Oral Daily   Or   thiamine  100 mg Intravenous Daily   Continuous Infusions:  furosemide (LASIX) 200 mg in dextrose 5 % 100 mL (2 mg/mL) infusion 4 mg/hr (09/18/23 1530)   PRN Meds: albuterol, diphenhydrAMINE-zinc acetate, HYDROmorphone (DILAUDID) injection, HYDROmorphone, LORazepam **OR** LORazepam, ondansetron **OR** ondansetron (ZOFRAN) IV   Vital Signs    Vitals:   09/19/23 0339 09/19/23 0740 09/19/23 0749 09/19/23 0750  BP: (!) 143/90 (!) 147/86    Pulse: (!) 106 88    Resp: 20     Temp: 98.2 F (36.8 C)     TempSrc:      SpO2: 96% 97% 97% 97%  Weight: 133.9 kg     Height:        Intake/Output Summary (Last 24 hours) at 09/19/2023 0752 Last data filed at 09/19/2023 0300 Gross per 24 hour  Intake 992.31 ml  Output 300 ml  Net 692.31 ml       09/19/2023    3:39 AM 09/18/2023    2:25 AM 09/17/2023   11:05 PM  Last 3 Weights  Weight (lbs) 295 lb 3.1 oz 296 lb 14.4 oz 280 lb  Weight (kg) 133.9 kg 134.673 kg 127.007 kg      Telemetry    Afib Hr 90-120s, mostly 90s - Personally Reviewed  ECG    No new - Personally Reviewed  Physical Exam   GEN: No acute distress.   Neck: No JVD Cardiac: Irreg Irreg, no murmurs, rubs, or gallops.  Respiratory: Clear to auscultation bilaterally. GI: Soft, nontender, non-distended  MS: No edema; No deformity. Neuro:  Nonfocal  Psych: Normal affect   Labs    High Sensitivity Troponin:   Recent Labs  Lab 09/17/23 2325 09/18/23 0027 09/18/23 0942 09/18/23 1158  TROPONINIHS 8 9 8 9      Chemistry Recent Labs  Lab 09/17/23 2325 09/18/23 0722 09/19/23 0417  NA 133* 136 137  K 3.8 4.2 3.9  CL 99 102 101  CO2 21* 23 26  GLUCOSE 173* 212* 177*  BUN 17 18 16   CREATININE 1.65* 1.63* 1.41*  CALCIUM 8.5* 8.6* 9.1  MG  --  1.8 1.6*  PROT 6.1* 6.3*  --   ALBUMIN 3.3* 3.3*  --   AST 18 17  --   ALT 14 14  --   ALKPHOS 76 80  --   BILITOT 0.6 1.2  --   GFRNONAA 47* 48* 57*  ANIONGAP 13 11 10     Lipids No results for input(s): "CHOL", "TRIG", "HDL", "LABVLDL", "LDLCALC", "CHOLHDL" in the last 168 hours.  Hematology Recent Labs  Lab 09/17/23 2325 09/18/23 0027 09/18/23 0722 09/18/23 1801 09/19/23 0417  WBC 6.2  --  6.0  --  7.3  RBC 3.01* 3.05* 3.53*  --  3.71*  HGB 6.6*  --  8.2* 8.4* 8.7*  HCT 23.5*  --  27.7* 29.1* 29.8*  MCV 78.1*  --  78.5*  --  80.3  MCH 21.9*  --  23.2*  --  23.5*  MCHC 28.1*  --  29.6*  --  29.2*  RDW 19.7*  --  19.2*  --  19.6*  PLT 322  --  322  --  357   Thyroid  Recent Labs  Lab 09/18/23 0942 09/18/23 1359  TSH 4.973*  --   FREET4  --  0.94    BNP Recent Labs  Lab 09/18/23 0942  BNP 158.0*    DDimer No results for input(s): "DDIMER" in the last 168 hours.   Radiology    US Abdomen Complete  Result Date: 09/18/2023 CLINICAL  DATA:  Right upper quadrant pain EXAM: ABDOMEN ULTRASOUND COMPLETE COMPARISON:  CT abdomen and pelvis 07/06/2023 FINDINGS: Gallbladder: Gallstones are visualized measuring up to 7 mm. There is no gallbladder wall thickening or pericholecystic fluid. No sonographic Murphy sign noted by sonographer. Common bile duct: Diameter: 5 mm Liver: Lobular contour. No focal lesion identified. There is increase in parenchymal echogenicity. Portal vein is patent on color Doppler imaging with normal direction of blood flow towards the liver. IVC: No abnormality visualized. Pancreas: Not visualized secondary to overlying bowel gas. Spleen: Enlarged measuring up to 14.5 cm in length. Right Kidney: Length: 12 cm. Echogenicity within normal limits. There is no hydronephrosis. There is a 1.5 x 1.7 x 1.3 cm cyst in the right kidney. Left Kidney: Length: 12.2 cm. Echogenicity within normal limits. There is no hydronephrosis. There is a 3.2 x 4.6 x 2.9 cm cyst in the left kidney. Abdominal aorta: The proximal abdominal aorta is visualized measuring up to 3.2 cm. Distal abdominal aorta not well seen. Other findings: None. IMPRESSION: 1. Cholelithiasis without sonographic evidence of acute cholecystitis. 2. Cirrhotic liver morphology. 3. Splenomegaly. 4. Bilateral renal cysts. 5. 3.2 cm abdominal aortic aneurysm. Recommend follow-up every 3 years. Electronically Signed   By: Darliss Cheney M.D.   On: 09/18/2023 17:07   ECHOCARDIOGRAM COMPLETE  Result Date: 09/18/2023    ECHOCARDIOGRAM REPORT   Patient Name:   Thomas Donovan Date of Exam: 09/18/2023 Medical Rec #:  [id number]        Height:       73.0 in Accession #:    [id number]       Weight:       296.9 lb Date of Birth:  1961/01/24         BSA:          2.545 m Patient Age:    62 years         BP:           126/84 mmHg Patient Gender: M  HR:           84 bpm. [facility] Procedure: 2D Echo, Cardiac Doppler and Color Doppler Indications:    Chest Pain R07.9   History:        Patient has prior history of Echocardiogram examinations, most                 recent 08/22/2017. CAD, Arrythmias:Atrial Fibrillation,                 Signs/Symptoms:Syncope; Risk Factors:Diabetes, Tobacco use and                 Hypertension. Alcohol use disorder, Cocaine abuse (HCC).  Sonographer:    Celesta Gentile RCS Referring Phys: [id number] [facility]  Sonographer Comments: Technically difficult study due to poor echo windows. Image acquisition challenging due to patient body habitus. IMPRESSIONS  1. Left ventricular ejection fraction, by estimation, is 60 to 65%. The left ventricle has normal function. The left ventricle has no regional wall motion abnormalities. There is mild left ventricular hypertrophy. Left ventricular diastolic parameters are indeterminate.  2. Right ventricular systolic function is normal. The right ventricular size is moderately enlarged.  3. Left atrial size was mildly dilated.  4. Right atrial size was mildly dilated.  5. The mitral valve is normal in structure. Trivial mitral valve regurgitation. No evidence of mitral stenosis.  6. The aortic valve has an indeterminant number of cusps. Aortic valve regurgitation is not visualized. No aortic stenosis is present.  7. Aortic dilatation noted.  8. The inferior vena cava is dilated in size with <50% respiratory variability, suggesting right atrial pressure of 15 mmHg. FINDINGS  Left Ventricle: Left ventricular ejection fraction, by estimation, is 60 to 65%. The left ventricle has normal function. The left ventricle has no regional wall motion abnormalities. Definity contrast agent was given IV to delineate the left ventricular  endocardial borders. The left ventricular internal cavity size was normal in size. There is mild left ventricular hypertrophy. Left ventricular diastolic parameters are indeterminate. Right Ventricle: The right ventricular size is moderately enlarged. Right vetricular wall thickness was not well  visualized. Right ventricular systolic function is normal. Left Atrium: Left atrial size was mildly dilated. Right Atrium: Right atrial size was mildly dilated. Pericardium: There is no evidence of pericardial effusion. Mitral Valve: The mitral valve is normal in structure. Trivial mitral valve regurgitation. No evidence of mitral valve stenosis. Tricuspid Valve: The tricuspid valve is normal in structure. Tricuspid valve regurgitation is trivial. No evidence of tricuspid stenosis. Aortic Valve: The aortic valve has an indeterminant number of cusps. Aortic valve regurgitation is not visualized. No aortic stenosis is present. Aortic valve mean gradient measures 3.1 mmHg. Aortic valve peak gradient measures 5.8 mmHg. Aortic valve area, by VTI measures 2.97 cm. Pulmonic Valve: The pulmonic valve was not well visualized. Pulmonic valve regurgitation is not visualized. No evidence of pulmonic stenosis. Aorta: Aortic dilatation noted and the aortic root is normal in size and structure. Venous: The inferior vena cava is dilated in size with less than 50% respiratory variability, suggesting right atrial pressure of 15 mmHg. IAS/Shunts: The interatrial septum was not well visualized.  LEFT VENTRICLE PLAX 2D LVIDd:         5.10 cm LVIDs:         3.00 cm LV PW:         1.20 cm LV IVS:        1.20 cm LVOT diam:  2.20 cm LV SV:         65 LV SV Index:   25 LVOT Area:     3.80 cm  RIGHT VENTRICLE TAPSE (M-mode): 1.8 cm LEFT ATRIUM              Index        RIGHT ATRIUM           Index LA diam:        5.00 cm  1.96 cm/m   RA Area:     29.05 cm LA Vol (A2C):   76.9 ml  30.22 ml/m  RA Volume:   98.00 ml  38.51 ml/m LA Vol (A4C):   118.0 ml 46.37 ml/m LA Biplane Vol: 103.0 ml 40.47 ml/m  AORTIC VALVE AV Area (Vmax):    3.25 cm AV Area (Vmean):   2.99 cm AV Area (VTI):     2.97 cm AV Vmax:           120.64 cm/s AV Vmean:          82.375 cm/s AV VTI:            0.218 m AV Peak Grad:      5.8 mmHg AV Mean Grad:      3.1  mmHg LVOT Vmax:         103.00 cm/s LVOT Vmean:        64.800 cm/s LVOT VTI:          0.170 m LVOT/AV VTI ratio: 0.78  AORTA Ao Root diam: 4.00 cm MITRAL VALVE MV Area (PHT): 3.53 cm     SHUNTS MV Decel Time: 215 msec     Systemic VTI:  0.17 m MV E velocity: 126.00 cm/s  Systemic Diam: 2.20 cm Dina Rich MD Electronically signed by Dina Rich MD Signature Date/Time: 09/18/2023/4:10:02 PM    Final    DG Chest Port 1 View  Result Date: 09/17/2023 CLINICAL DATA:  Near syncope chest pain EXAM: PORTABLE CHEST 1 VIEW COMPARISON:  07/03/2018 FINDINGS: The heart size and mediastinal contours are within normal limits. Both lungs are clear. The visualized skeletal structures are unremarkable. IMPRESSION: No active disease. Electronically Signed   By: Jasmine Pang M.D.   On: 09/17/2023 23:59    Cardiac Studies   Echo 09/18/23 1. Left ventricular ejection fraction, by estimation, is 60 to 65%. The  left ventricle has normal function. The left ventricle has no regional  wall motion abnormalities. There is mild left ventricular hypertrophy.  Left ventricular diastolic parameters  are indeterminate.   2. Right ventricular systolic function is normal. The right ventricular  size is moderately enlarged.   3. Left atrial size was mildly dilated.   4. Right atrial size was mildly dilated.   5. The mitral valve is normal in structure. Trivial mitral valve  regurgitation. No evidence of mitral stenosis.   6. The aortic valve has an indeterminant number of cusps. Aortic valve  regurgitation is not visualized. No aortic stenosis is present.   7. Aortic dilatation noted.   8. The inferior vena cava is dilated in size with <50% respiratory  variability, suggesting right atrial pressure of 15 mmHg.   Echo 03/2023 Summary   1. Technically difficult study.    2. The left ventricle is not well visualized but probably normal in size  with normal wall thickness.    3. The left ventricular systolic function  is normal, LVEF is visually  estimated at > 55%.  4. The left atrium is moderately dilated in size.    5. The right ventricle is mildly dilated in size, with normal systolic  function.   6. The right atrium is mildly to moderately dilated in size.    7. IVC size and inspiratory change suggest elevated right atrial pressure.  (10-20 mmHg).    8. Normal size aortic root.  Ascending aorta is dilated measuring 4.1 cm.      Myoview lexiscan 2022 1. No reversible ischemia or infarction.   2. Normal left ventricular wall motion.   3. Left ventricular ejection fraction 62%   4. Non invasive risk stratification*: Low   *2012 Appropriate Use Criteria for Coronary Revascularization  Focused Update: J Am Coll Cardiol. 2012;59(9):857-881.  http://content.[email or web address].com.aspx?articleid=[email or web address]    This result has an attachment that is not available.   No significant ST segment changes or arrhythmias were noted during  stress   Non-diagnostic EKG portion of Lexiscan SPECT study   Image interpretation to follow per Radiology department dictated in a  separate report   Stress Findings  The patient underwent pharmacologic stress with regadenoson, 0.4 mg IV. - The patient experienced no angina during the stress test.   Baseline ECG  - Baseline ECG shows normal sinus rhythm, normal axis and non-specific ST T wave changes.   Stress ECG  - No significant ST segment changes or arrhythmias were noted during stress.    Echo 04/2021 Summary   1. Technically difficult study.    2. The left ventricle is normal in size with normal wall thickness.    3. The left ventricular systolic function is normal, LVEF is visually  estimated at 65-70%.    4. There is grade I diastolic dysfunction (impaired relaxation).    5. The left atrium is mildly to moderately dilated in size.    6. The right ventricle is upper normal in size, with normal systolic  function.   Patient Profile     62 y.o.  male  tobacco use, diabetes type 2, CKD stage III, OSA noncompliant with CPAP, cocaine use, paroxysmal A-fib, hypertension, dyslipidemia, obesity, COPD, noncompliance, leaving AMA who is being seen 09/18/2023 for the evaluation of atrial fibrillation with RVR   Assessment & Plan    Persistent Afib -Patient was admitted with symptomatic anemia, atypical chest pain with negative troponin, positive UDS for cocaine, alcohol disorder asked to be seen for A-fib -Patient has a long history of A-fib with medication noncompliance and poor follow-up with multiple ER visits -He was previously followed at [facility] most recently previously on Eliquis and diltiazem 30 mg 4 times daily - unclear if he is mostly in afib -Rates exacerbated due to the above acute issues -Eliquis held for anemia. Overall, patient is not a good candidate for anticoagulation given to poor compliance and follow-up -Keep mag greater than 2 and K greater than 4 - rates occasionally elevated, can increase and consolidate dilt - echo showed normal pump function, mild LVH   Symptomatic anemia -Hemoglobin 6.6 on admission status post 2 units PRBCs -Eliquis PTA for A-fib held -Follow-up hemoglobin 8.7 - daily CBC - GI consulted and no plan for procedure given cocaine+. Plan for outpatient follow up.  Chest pain -Atypical chest pain in the setting of symptomatic anemia, cocaine use and A-fib RVR -Troponin negative x 2 -Myoview Lexiscan in 2022 was low risk with no ischemia or infarction - Echo showed normal pump function - continue Imdur - no plan for ischemic work-up at this time  HFpEF - echo this admission showed LVEF 60-65%, no WMA, mild LVH. - difficult to assess volume, but may have extra volume on him - BNP 158 - started on lasix drip by primary team   CKD stage III - Scr 1.68>1.41, still above baseline - Daily BMET   Cocaine use -UDS positive for cocaine   Alcohol use -Alcohol level was 120 -CIWA Per IM  For  questions or updates, please contact [facility] Please consult [email or web address] for contact info under        Signed, Cadence David Stall, PA-C  09/19/2023, 7:52 AM     Attending note  Patient seen and discussed with PA Fransico Michael, I agree with her documentation.  1.Chest pain - long history of chest pain - Hgb 6.6 on presentation, cocaine + -2018 nuclear stress without ischemia. Discussions at that time to consider cath but patient declined. Never followed up with cards as outpatient  - 2022 nuclear stress [facility]: No ischemia - current symptoms are not cardiac, fullness midchest/epigastric lasting hours a time.    - in general not a cath candidate given recurrent transfusion dependent anemia. Very clear alternative etiologies for chest pain with severe anemia, cocaine +. Nuclear stress testing has been negative twice within the last 6 years. Recent chest pains noncardiac in description - echo this admit LVE 60-65%, no WMAs, indet diastolic fxn       2.Anemia - Hgb 6.6 on presentation - from [facility] notes multiple prior admits with anemia and multiple transfusions - reports hematuria, intermittent bright red blood per rectum as well - ferritin 18. Hemoccult + in ER.  - seen by GI, per note not candidate for procedures at this time due to positive drug screen, reconsider as outpatient.      3. Afib - prior history of afib per [facility] notes, has been on eliquis - admitted with symptomatic anemia Hgb 6.6, not an anticoag candidate at this time.  - would stay off eliquis given recurrent anemia - if establishes with consitent f/u could possibly consider watchman evaluation as outpatient.    - on his home his home dilt 30mg  every 6 hours, rates 100s-120s. Change to oral dilt 60mg  tid, can consolidate to long acting once rates controlled.     4.CKD - admit Cr 1.65 and stable   5. Chronic HFpEF - 03/2023 echo [facility]: LVE >55%, no significant valve pathology - monitor volume status with  transfusions. Difficult to assess by exma, bnp is pending. Consider reds vest tomorrow if starts to show signs of fluid overload  - BNP 158, IVC fixed and dilated.  - primary team started lasix gtt at 4mg /hr, weight is down 1 lbs - incomplete I/Os.  - check reds vest.  - remains fluid overloaded, increase lasix gtt to 6mg /hr   6. AAA  3.2 cm AAA noted on abd USneeds repeat study 3 years.   Dina Rich MD

## 2023-09-19 NOTE — Progress Notes (Signed)
Subjective: Reports he is feeling okay overall.  Denies BRBPR, melena, abdominal pain, nausea, vomiting.  Denies abdominal distention.  Notes some mild swelling in the lower extremities, primarily at the left ankle/foot as he twisted it yesterday.  Notes chronic alcohol abuse.  States he has been drinking since he was 68 or 62 years old.  Currently, he has been drinking at least a 12 pack a day along with liquor and wine in addition to this.  States before he came to the hospital, he had drink a 12 pack, 1 pint of liquor, and a few bootleggers.  States he needs something to take the edge off while he quits drinking alcohol, but states he feels that he can quit drinking alcohol on his own.  He is not interested in going to any rehabilitation.  States he will be no further cocaine use.  States he tried this to help cope with the loss of his son which was not helpful.  However, patient has been positive for cocaine dating back to 2022.  Denies any history of IV drug use.  Objective: Vital signs in last 24 hours: Temp:  [97.5 F (36.4 C)-98.2 F (36.8 C)] 98.2 F (36.8 C) (09/24 0339) Pulse Rate:  [86-108] 86 (09/24 0858) Resp:  [19-23] 20 (09/24 0339) BP: (119-148)/(70-90) 142/82 (09/24 0858) SpO2:  [91 %-99 %] 97 % (09/24 0750) Weight:  [133.9 kg] 133.9 kg (09/24 0339) Last BM Date : 09/17/23 General:   Alert and oriented, pleasant, NAD.  Head:  Normocephalic and atraumatic. Eyes:  No icterus, sclera clear. Conjuctiva pink.  Abdomen:  Bowel sounds present, soft, non-tender, non-distended. No HSM or hernias noted. No rebound or guarding. No masses appreciated  Msk:  Symmetrical without gross deformities. Normal posture. Extremities:  With 1+ bilateral LE edema.  Neurologic:  Alert and  oriented x4;  grossly normal neurologically. Skin:  Warm and dry, intact without significant lesions.  Psych:  Normal mood and affect.  Intake/Output from previous day: 09/23 0701 - 09/24 0700 In:  992.3 [P.O.:720; I.V.:2.3; IV Piggyback:270] Out: 300 [Urine:300] Intake/Output this shift: Total I/O In: -  Out: 300 [Urine:300]  Lab Results: Recent Labs    09/17/23 2325 09/18/23 0722 09/18/23 1801 09/19/23 0417  WBC 6.2 6.0  --  7.3  HGB 6.6* 8.2* 8.4* 8.7*  HCT 23.5* 27.7* 29.1* 29.8*  PLT 322 322  --  357   BMET Recent Labs    09/17/23 2325 09/18/23 0722 09/19/23 0417  NA 133* 136 137  K 3.8 4.2 3.9  CL 99 102 101  CO2 21* 23 26  GLUCOSE 173* 212* 177*  BUN 17 18 16   CREATININE 1.65* 1.63* 1.41*  CALCIUM 8.5* 8.6* 9.1   LFT Recent Labs    09/17/23 2325 09/18/23 0722  PROT 6.1* 6.3*  ALBUMIN 3.3* 3.3*  AST 18 17  ALT 14 14  ALKPHOS 76 80  BILITOT 0.6 1.2    Studies/Results: US LIVER DOPPLER  Result Date: 09/19/2023 CLINICAL DATA:  Cirrhosis. EXAM: DUPLEX ULTRASOUND OF LIVER TECHNIQUE: Color and duplex Doppler ultrasound was performed to evaluate the hepatic in-flow and out-flow vessels. COMPARISON:  Ultrasound of the abdomen performed separately today. FINDINGS: Liver: Morphology of the liver is consistent with hepatic cirrhosis. No focal lesion, mass or intrahepatic biliary ductal dilatation. Main Portal Vein size: 1.3 cm Portal Vein Velocities Main Prox:  67 cm/sec Main Mid: 47 cm/sec Main Dist:  36 cm/sec Right: 39 cm/sec Left: 27 cm/sec Hepatic Vein Velocities  Right:  47 cm/sec Middle:  86 cm/sec Left:  116 cm/sec IVC: Present and patent with normal respiratory phasicity. Hepatic Artery Velocity:  50 cm/sec Splenic Vein Velocity:   cm/sec Spleen: 14.5 cm x 5.3 cm x 16.2 cm with a total volume of 651 cm^3 (411 cm^3 is upper limit normal) Portal Vein Occlusion/Thrombus: No Splenic Vein Occlusion/Thrombus: No Ascites: None Varices: None Normal direction of portal vein flow towards the liver with normal waveforms. Hepatic vein waveforms are within normal limits. IMPRESSION: 1. Morphology of the liver consistent with hepatic cirrhosis. No focal liver lesion  identified. 2. Splenomegaly. 3. Patent portal vein with normal direction of flow towards the liver. No evidence of portal vein thrombus. 4. Normal measured portal vein velocities without significant depression to suggest significant portal hypertension by duplex ultrasound. Electronically Signed   By: Irish Lack M.D.   On: 09/19/2023 07:54   US Abdomen Complete  Result Date: 09/18/2023 CLINICAL DATA:  Right upper quadrant pain EXAM: ABDOMEN ULTRASOUND COMPLETE COMPARISON:  CT abdomen and pelvis 07/06/2023 FINDINGS: Gallbladder: Gallstones are visualized measuring up to 7 mm. There is no gallbladder wall thickening or pericholecystic fluid. No sonographic Murphy sign noted by sonographer. Common bile duct: Diameter: 5 mm Liver: Lobular contour. No focal lesion identified. There is increase in parenchymal echogenicity. Portal vein is patent on color Doppler imaging with normal direction of blood flow towards the liver. IVC: No abnormality visualized. Pancreas: Not visualized secondary to overlying bowel gas. Spleen: Enlarged measuring up to 14.5 cm in length. Right Kidney: Length: 12 cm. Echogenicity within normal limits. There is no hydronephrosis. There is a 1.5 x 1.7 x 1.3 cm cyst in the right kidney. Left Kidney: Length: 12.2 cm. Echogenicity within normal limits. There is no hydronephrosis. There is a 3.2 x 4.6 x 2.9 cm cyst in the left kidney. Abdominal aorta: The proximal abdominal aorta is visualized measuring up to 3.2 cm. Distal abdominal aorta not well seen. Other findings: None. IMPRESSION: 1. Cholelithiasis without sonographic evidence of acute cholecystitis. 2. Cirrhotic liver morphology. 3. Splenomegaly. 4. Bilateral renal cysts. 5. 3.2 cm abdominal aortic aneurysm. Recommend follow-up every 3 years. Electronically Signed   By: Darliss Cheney M.D.   On: 09/18/2023 17:07   ECHOCARDIOGRAM COMPLETE  Result Date: 09/18/2023    ECHOCARDIOGRAM REPORT   Patient Name:   Thomas Donovan Date of  Exam: 09/18/2023 Medical Rec #:  [id number]        Height:       73.0 in Accession #:    [id number]       Weight:       296.9 lb Date of Birth:  March 22, 1961         BSA:          2.545 m Patient Age:    62 years         BP:           126/84 mmHg Patient Gender: M                HR:           84 bpm. [facility] Procedure: 2D Echo, Cardiac Doppler and Color Doppler Indications:    Chest Pain R07.9  History:        Patient has prior history of Echocardiogram examinations, most                 recent 08/22/2017. CAD, Arrythmias:Atrial Fibrillation,  Signs/Symptoms:Syncope; Risk Factors:Diabetes, Tobacco use and                 Hypertension. Alcohol use disorder, Cocaine abuse (HCC).  Sonographer:    Celesta Gentile RCS Referring Phys: [id number] [facility]  Sonographer Comments: Technically difficult study due to poor echo windows. Image acquisition challenging due to patient body habitus. IMPRESSIONS  1. Left ventricular ejection fraction, by estimation, is 60 to 65%. The left ventricle has normal function. The left ventricle has no regional wall motion abnormalities. There is mild left ventricular hypertrophy. Left ventricular diastolic parameters are indeterminate.  2. Right ventricular systolic function is normal. The right ventricular size is moderately enlarged.  3. Left atrial size was mildly dilated.  4. Right atrial size was mildly dilated.  5. The mitral valve is normal in structure. Trivial mitral valve regurgitation. No evidence of mitral stenosis.  6. The aortic valve has an indeterminant number of cusps. Aortic valve regurgitation is not visualized. No aortic stenosis is present.  7. Aortic dilatation noted.  8. The inferior vena cava is dilated in size with <50% respiratory variability, suggesting right atrial pressure of 15 mmHg. FINDINGS  Left Ventricle: Left ventricular ejection fraction, by estimation, is 60 to 65%. The left ventricle has normal function. The left ventricle has  no regional wall motion abnormalities. Definity contrast agent was given IV to delineate the left ventricular  endocardial borders. The left ventricular internal cavity size was normal in size. There is mild left ventricular hypertrophy. Left ventricular diastolic parameters are indeterminate. Right Ventricle: The right ventricular size is moderately enlarged. Right vetricular wall thickness was not well visualized. Right ventricular systolic function is normal. Left Atrium: Left atrial size was mildly dilated. Right Atrium: Right atrial size was mildly dilated. Pericardium: There is no evidence of pericardial effusion. Mitral Valve: The mitral valve is normal in structure. Trivial mitral valve regurgitation. No evidence of mitral valve stenosis. Tricuspid Valve: The tricuspid valve is normal in structure. Tricuspid valve regurgitation is trivial. No evidence of tricuspid stenosis. Aortic Valve: The aortic valve has an indeterminant number of cusps. Aortic valve regurgitation is not visualized. No aortic stenosis is present. Aortic valve mean gradient measures 3.1 mmHg. Aortic valve peak gradient measures 5.8 mmHg. Aortic valve area, by VTI measures 2.97 cm. Pulmonic Valve: The pulmonic valve was not well visualized. Pulmonic valve regurgitation is not visualized. No evidence of pulmonic stenosis. Aorta: Aortic dilatation noted and the aortic root is normal in size and structure. Venous: The inferior vena cava is dilated in size with less than 50% respiratory variability, suggesting right atrial pressure of 15 mmHg. IAS/Shunts: The interatrial septum was not well visualized.  LEFT VENTRICLE PLAX 2D LVIDd:         5.10 cm LVIDs:         3.00 cm LV PW:         1.20 cm LV IVS:        1.20 cm LVOT diam:     2.20 cm LV SV:         65 LV SV Index:   25 LVOT Area:     3.80 cm  RIGHT VENTRICLE TAPSE (M-mode): 1.8 cm LEFT ATRIUM              Index        RIGHT ATRIUM           Index LA diam:        5.00 cm  1.96 cm/m   RA  Area:     29.05 cm LA Vol (A2C):   76.9 ml  30.22 ml/m  RA Volume:   98.00 ml  38.51 ml/m LA Vol (A4C):   118.0 ml 46.37 ml/m LA Biplane Vol: 103.0 ml 40.47 ml/m  AORTIC VALVE AV Area (Vmax):    3.25 cm AV Area (Vmean):   2.99 cm AV Area (VTI):     2.97 cm AV Vmax:           120.64 cm/s AV Vmean:          82.375 cm/s AV VTI:            0.218 m AV Peak Grad:      5.8 mmHg AV Mean Grad:      3.1 mmHg LVOT Vmax:         103.00 cm/s LVOT Vmean:        64.800 cm/s LVOT VTI:          0.170 m LVOT/AV VTI ratio: 0.78  AORTA Ao Root diam: 4.00 cm MITRAL VALVE MV Area (PHT): 3.53 cm     SHUNTS MV Decel Time: 215 msec     Systemic VTI:  0.17 m MV E velocity: 126.00 cm/s  Systemic Diam: 2.20 cm Dina Rich MD Electronically signed by Dina Rich MD Signature Date/Time: 09/18/2023/4:10:02 PM    Final    DG Chest Port 1 View  Result Date: 09/17/2023 CLINICAL DATA:  Near syncope chest pain EXAM: PORTABLE CHEST 1 VIEW COMPARISON:  07/03/2018 FINDINGS: The heart size and mediastinal contours are within normal limits. Both lungs are clear. The visualized skeletal structures are unremarkable. IMPRESSION: No active disease. Electronically Signed   By: Jasmine Pang M.D.   On: 09/17/2023 23:59    Assessment:  62 y.o. year old male with a history of afib, DM, CKD, OSA, polysubstance abuse including alcohol and cocaine, concerns for cirrhosis in the past, presenting to the ED with progressive weakness, midsternal chest pain, dizziness, and found to have Hgb 6.6 on admission with GI consult requested in light of anemia and heme positive stool.   Anemia:  Stool heme positive, but no overt GI bleeding recently. Reported history of low-volume hematochezia for the last 10 to 15 years which she attributed to hemorrhoids, but none recently. Has taken some BC powders recently. Hemoglobin of 6.6 on admission with evidence of IDA, ferritin 18, along with B12 deficiency.  He received 2 units PRBCs with hemoglobin improved  to 8.2 thereafter.  Hemoglobin up to 8.7 today.  Notably, patient's last hemoglobin on file prior to admission was 7.4 in July 2024 and he has reported multiple transfusions over the past year.  In the setting of Eliquis, he could essentially be oozing anywhere from the GI tract.  He needs EGD and colonoscopy, but he is not a candidate at this time due to positive drug screen for cocaine.  He will need outpatient follow-up to arrange procedures at that time.  Cirrhosis: Ultrasound yesterday confirming cirrhotic liver morphology. Presented with fairly significant LE edema (reported as 2-3+) and started on Lasix drip per hospitalist.  This is likely multifactorial in the setting of heart failure with preserved ejection fraction and cirrhosis and is overall much improved.  No ascites, HE.  Will need to check INR tomorrow for MELD calculation.   Etiology likely secondary to chronic alcohol abuse, but we can complete full workup outpatient to rule out other underlying etiologies.  He will also need EGD outpatient for variceal screening in  addition to IDA as per above.   We discussed the need to abstain from alcohol extensively today and patient states he should be able to stop drinking alcohol, but he will need some medication assistance with this.  He has not interested in going to rehabilitation.    Plan: Continue to trend H/H and for overt GI bleeding.  2g sodium diet.  Absolute avoidance of cocaine and alcohol.  Counseled on this today. NSAID avoidance.  Start oral iron at discharge.  When transitioning to p.o. diuretics, consider starting Lasix 40 mg and spironolactone 100 mg daily. Will need outpatient visit for cirrhosis care and to arrange EGD and colonoscopy.     LOS: 1 day    09/19/2023, 1:45 PM   Ermalinda Memos, [facility]

## 2023-09-19 NOTE — Progress Notes (Signed)
Patient stated he felt as if his ankle popped yesterday, and that it is hurting today, Dr. Thomes Dinning made aware.

## 2023-09-19 NOTE — Plan of Care (Signed)
  Problem: Education: Goal: Knowledge of General Education information will improve Description Including pain rating scale, medication(s)/side effects and non-pharmacologic comfort measures Outcome: Progressing   Problem: Health Behavior/Discharge Planning: Goal: Ability to manage health-related needs will improve Outcome: Progressing   

## 2023-09-19 NOTE — Progress Notes (Signed)
PT Cancellation Note  Patient Details Name: Thomas Donovan MRN: [id number] DOB: 15-Jan-1961   Cancelled Treatment:    Reason Eval/Treat Not Completed: Pain limiting ability to participate;Fatigue/lethargy limiting ability to participate Pt lethergic related to medication and c/o foot pain.  PT session held.  Will try again later if available.  Becky Sax, LPTA/CLT; CBIS [phone]  Juel Burrow 09/19/2023, 9:19 AM

## 2023-09-20 DIAGNOSIS — R195 Other fecal abnormalities: Secondary | ICD-10-CM

## 2023-09-20 DIAGNOSIS — N1831 Chronic kidney disease, stage 3a: Secondary | ICD-10-CM | POA: Diagnosis not present

## 2023-09-20 DIAGNOSIS — F1410 Cocaine abuse, uncomplicated: Secondary | ICD-10-CM | POA: Diagnosis not present

## 2023-09-20 DIAGNOSIS — M10272 Drug-induced gout, left ankle and foot: Secondary | ICD-10-CM

## 2023-09-20 DIAGNOSIS — D649 Anemia, unspecified: Secondary | ICD-10-CM | POA: Diagnosis not present

## 2023-09-20 DIAGNOSIS — R0789 Other chest pain: Secondary | ICD-10-CM | POA: Diagnosis not present

## 2023-09-20 LAB — CBC
HCT: 32.2 % — ABNORMAL LOW (ref 39.0–52.0)
Hemoglobin: 8.9 g/dL — ABNORMAL LOW (ref 13.0–17.0)
MCH: 22.8 pg — ABNORMAL LOW (ref 26.0–34.0)
MCHC: 27.6 g/dL — ABNORMAL LOW (ref 30.0–36.0)
MCV: 82.4 fL (ref 80.0–100.0)
Platelets: 327 10*3/uL (ref 150–400)
RBC: 3.91 MIL/uL — ABNORMAL LOW (ref 4.22–5.81)
RDW: 20.3 % — ABNORMAL HIGH (ref 11.5–15.5)
WBC: 7.3 10*3/uL (ref 4.0–10.5)
nRBC: 0 % (ref 0.0–0.2)

## 2023-09-20 LAB — BASIC METABOLIC PANEL
Anion gap: 13 (ref 5–15)
BUN: 17 mg/dL (ref 8–23)
CO2: 27 mmol/L (ref 22–32)
Calcium: 9 mg/dL (ref 8.9–10.3)
Chloride: 95 mmol/L — ABNORMAL LOW (ref 98–111)
Creatinine, Ser: 1.51 mg/dL — ABNORMAL HIGH (ref 0.61–1.24)
GFR, Estimated: 52 mL/min — ABNORMAL LOW (ref >60)
Glucose, Bld: 186 mg/dL — ABNORMAL HIGH (ref 70–99)
Potassium: 3.9 mmol/L (ref 3.5–5.1)
Sodium: 135 mmol/L (ref 135–145)

## 2023-09-20 LAB — GLUCOSE, CAPILLARY
Glucose-Capillary: 208 mg/dL — ABNORMAL HIGH (ref 70–99)
Glucose-Capillary: 253 mg/dL — ABNORMAL HIGH (ref 70–99)
Glucose-Capillary: 171 mg/dL — ABNORMAL HIGH (ref 70–99)
Glucose-Capillary: 200 mg/dL — ABNORMAL HIGH (ref 70–99)
Glucose-Capillary: 186 mg/dL — ABNORMAL HIGH (ref 70–99)

## 2023-09-20 LAB — PHOSPHORUS: Phosphorus: 3.7 mg/dL (ref 2.5–4.6)

## 2023-09-20 LAB — PROTIME-INR
INR: 1.1 (ref 0.8–1.2)
Prothrombin Time: 14.3 seconds (ref 11.4–15.2)

## 2023-09-20 LAB — MAGNESIUM: Magnesium: 1.7 mg/dL (ref 1.7–2.4)

## 2023-09-20 MED ORDER — DICLOFENAC SODIUM 1 % EX GEL: 2.0000 g | Freq: Four times a day (QID) | CUTANEOUS | Status: DC | PRN

## 2023-09-20 MED ORDER — DILTIAZEM HCL ER COATED BEADS 180 MG PO CP24
180.0000 mg | ORAL_CAPSULE | Freq: Every day | ORAL | Status: DC
  Administered 2023-09-21: 180 mg via ORAL
  Filled 2023-09-20: qty 1

## 2023-09-20 MED ORDER — INSULIN ASPART 100 UNIT/ML IJ SOLN
0.0000 [IU] | Freq: Three times a day (TID) | INTRAMUSCULAR | Status: DC
  Administered 2023-09-20 (×2): 2 [IU] via SUBCUTANEOUS
  Administered 2023-09-21 (×2): 3 [IU] via SUBCUTANEOUS

## 2023-09-20 MED ORDER — COLCHICINE 0.6 MG PO TABS
0.6000 mg | ORAL_TABLET | Freq: Two times a day (BID) | ORAL | Status: DC | PRN
  Administered 2023-09-20 (×2): 0.6 mg via ORAL
  Filled 2023-09-20 (×2): qty 1

## 2023-09-20 MED ORDER — INSULIN ASPART 100 UNIT/ML IJ SOLN
5.0000 [IU] | Freq: Three times a day (TID) | INTRAMUSCULAR | Status: DC
  Administered 2023-09-20 – 2023-09-21 (×4): 5 [IU] via SUBCUTANEOUS

## 2023-09-20 NOTE — Plan of Care (Signed)

## 2023-09-20 NOTE — Inpatient Diabetes Management (Signed)
Inpatient Diabetes Program Recommendations  AACE/ADA: New Consensus Statement on Inpatient Glycemic Control (2015)  Target Ranges:  Prepandial:   less than 140 mg/dL      Peak postprandial:   less than 180 mg/dL (1-2 hours)      Critically ill patients:  140 - 180 mg/dL    Latest Reference Range & Units 09/18/23 23:49 09/19/23 03:40 09/19/23 07:31 09/19/23 11:55 09/19/23 16:45 09/19/23 22:23  Glucose-Capillary 70 - 99 mg/dL 440 (H)  2 units Novolog  197 (H)  2 units Novolog  169 (H)  2 units Novolog  206 (H)  3 units Novolog  163 (H)  2 units Novolog  238 (H)  3 units Novolog   (H): Data is abnormally high  Latest Reference Range & Units 09/20/23 07:57  Glucose-Capillary 70 - 99 mg/dL 102 (H)  5 units Novolog   (H): Data is abnormally high    Home DM Meds: Glipizide 20 mg daily        Metformin 850 mg daily        Rybelsus 14 mg daily  Current Orders: Novolog Sensitive Correction Scale/ SSI (0-9 units) Q4 hours    Allowed PO diet + Glucerna PO Supps 237 ml TID between meals  Eating 75% meals and Drinking the PO Supps   MD- Please consider the following:  1. Change/Increase the Novolog SSI to the Moderate scale (0-15 units TID AC + HS)  2. Start Novolog Meal Coverage: Novolog 4 units TID with meals HOLD if pt NPO HOLD if pt eats <50% meals    --Will follow patient during hospitalization--  Ambrose Finland RN, MSN, CDCES Diabetes Coordinator Inpatient Glycemic Control Team Team Pager: [phone] (8a-5p)

## 2023-09-20 NOTE — Progress Notes (Signed)
Rounding Note    Patient Name: Thomas Donovan Date of Encounter: 09/20/2023  [facility] Cardiologist: New  Subjective   Some ongoing SOB but improving.   Inpatient Medications    Scheduled Meds:  acetaminophen  650 mg Oral TID   cyanocobalamin  1,000 mcg Intramuscular Daily   Followed by   Melene Muller ON 09/25/2023] vitamin B-12  1,000 mcg Oral Daily   diltiazem  60 mg Oral TID   feeding supplement (GLUCERNA SHAKE)  237 mL Oral TID BM   fluticasone furoate-vilanterol  1 puff Inhalation Daily   And   umeclidinium bromide  1 puff Inhalation Daily   folic acid  1 mg Oral Daily   gabapentin  600 mg Oral TID   insulin aspart  0-9 Units Subcutaneous TID WC   insulin aspart  5 Units Subcutaneous TID WC   isosorbide mononitrate  30 mg Oral Daily   magnesium oxide  400 mg Oral BID   multivitamin with minerals  1 tablet Oral Daily   pantoprazole (PROTONIX) IV  40 mg Intravenous Q12H   tamsulosin  0.4 mg Oral Daily   thiamine  100 mg Oral Daily   Or   thiamine  100 mg Intravenous Daily   Continuous Infusions:  furosemide (LASIX) 200 mg in dextrose 5 % 100 mL (2 mg/mL) infusion 6 mg/hr (09/20/23 0309)   PRN Meds: albuterol, colchicine, diclofenac Sodium, diphenhydrAMINE-zinc acetate, HYDROmorphone (DILAUDID) injection, HYDROmorphone, LORazepam **OR** LORazepam, ondansetron **OR** ondansetron (ZOFRAN) IV   Vital Signs    Vitals:   09/19/23 2227 09/19/23 2241 09/20/23 0341 09/20/23 0803  BP: 137/89  (!) 153/88   Pulse: (!) 110 (!) 101 91   Resp: 20 18 20    Temp: 98.9 F (37.2 C)  98.2 F (36.8 C)   TempSrc:   Oral   SpO2: 98% 98% 95% 93%  Weight:   134.5 kg   Height:        Intake/Output Summary (Last 24 hours) at 09/20/2023 0856 Last data filed at 09/20/2023 0844 Gross per 24 hour  Intake 1295.82 ml  Output 3875 ml  Net -2579.18 ml      09/20/2023    3:41 AM 09/19/2023    3:39 AM 09/18/2023    2:25 AM  Last 3 Weights  Weight (lbs) 296 lb 8.3 oz 295 lb  3.1 oz 296 lb 14.4 oz  Weight (kg) 134.5 kg 133.9 kg 134.673 kg      Telemetry    Afib 80s-100s - Personally Reviewed  ECG    N/a - Personally Reviewed  Physical Exam   GEN: No acute distress.   Neck: no jvd Cardiac: irreg.  Respiratory: decreased breath sounds bilateral bases GI: Soft, nontender, non-distended  MS: 1+ bialteral LE edema Neuro:  Nonfocal  Psych: Normal affect   Labs    High Sensitivity Troponin:   Recent Labs  Lab 09/17/23 2325 09/18/23 0027 09/18/23 0942 09/18/23 1158  TROPONINIHS 8 9 8 9      Chemistry Recent Labs  Lab 09/17/23 2325 09/18/23 0722 09/19/23 0417 09/20/23 0423  NA 133* 136 137 135  K 3.8 4.2 3.9 3.9  CL 99 102 101 95*  CO2 21* 23 26 27   GLUCOSE 173* 212* 177* 186*  BUN 17 18 16 17   CREATININE 1.65* 1.63* 1.41* 1.51*  CALCIUM 8.5* 8.6* 9.1 9.0  MG  --  1.8 1.6* 1.7  PROT 6.1* 6.3*  --   --   ALBUMIN 3.3* 3.3*  --   --  AST 18 17  --   --   ALT 14 14  --   --   ALKPHOS 76 80  --   --   BILITOT 0.6 1.2  --   --   GFRNONAA 47* 48* 57* 52*  ANIONGAP 13 11 10 13     Lipids No results for input(s): "CHOL", "TRIG", "HDL", "LABVLDL", "LDLCALC", "CHOLHDL" in the last 168 hours.  Hematology Recent Labs  Lab 09/18/23 0722 09/18/23 1801 09/19/23 0417 09/20/23 0423  WBC 6.0  --  7.3 7.3  RBC 3.53*  --  3.71* 3.91*  HGB 8.2* 8.4* 8.7* 8.9*  HCT 27.7* 29.1* 29.8* 32.2*  MCV 78.5*  --  80.3 82.4  MCH 23.2*  --  23.5* 22.8*  MCHC 29.6*  --  29.2* 27.6*  RDW 19.2*  --  19.6* 20.3*  PLT 322  --  357 327   Thyroid  Recent Labs  Lab 09/18/23 0942 09/18/23 1359  TSH 4.973*  --   FREET4  --  0.94    BNP Recent Labs  Lab 09/18/23 0942  BNP 158.0*    DDimer No results for input(s): "DDIMER" in the last 168 hours.   Radiology    US LIVER DOPPLER  Result Date: 09/19/2023 CLINICAL DATA:  Cirrhosis. EXAM: DUPLEX ULTRASOUND OF LIVER TECHNIQUE: Color and duplex Doppler ultrasound was performed to evaluate the hepatic  in-flow and out-flow vessels. COMPARISON:  Ultrasound of the abdomen performed separately today. FINDINGS: Liver: Morphology of the liver is consistent with hepatic cirrhosis. No focal lesion, mass or intrahepatic biliary ductal dilatation. Main Portal Vein size: 1.3 cm Portal Vein Velocities Main Prox:  67 cm/sec Main Mid: 47 cm/sec Main Dist:  36 cm/sec Right: 39 cm/sec Left: 27 cm/sec Hepatic Vein Velocities Right:  47 cm/sec Middle:  86 cm/sec Left:  116 cm/sec IVC: Present and patent with normal respiratory phasicity. Hepatic Artery Velocity:  50 cm/sec Splenic Vein Velocity:   cm/sec Spleen: 14.5 cm x 5.3 cm x 16.2 cm with a total volume of 651 cm^3 (411 cm^3 is upper limit normal) Portal Vein Occlusion/Thrombus: No Splenic Vein Occlusion/Thrombus: No Ascites: None Varices: None Normal direction of portal vein flow towards the liver with normal waveforms. Hepatic vein waveforms are within normal limits. IMPRESSION: 1. Morphology of the liver consistent with hepatic cirrhosis. No focal liver lesion identified. 2. Splenomegaly. 3. Patent portal vein with normal direction of flow towards the liver. No evidence of portal vein thrombus. 4. Normal measured portal vein velocities without significant depression to suggest significant portal hypertension by duplex ultrasound. Electronically Signed   By: Irish Lack M.D.   On: 09/19/2023 07:54   US Abdomen Complete  Result Date: 09/18/2023 CLINICAL DATA:  Right upper quadrant pain EXAM: ABDOMEN ULTRASOUND COMPLETE COMPARISON:  CT abdomen and pelvis 07/06/2023 FINDINGS: Gallbladder: Gallstones are visualized measuring up to 7 mm. There is no gallbladder wall thickening or pericholecystic fluid. No sonographic Murphy sign noted by sonographer. Common bile duct: Diameter: 5 mm Liver: Lobular contour. No focal lesion identified. There is increase in parenchymal echogenicity. Portal vein is patent on color Doppler imaging with normal direction of blood flow towards  the liver. IVC: No abnormality visualized. Pancreas: Not visualized secondary to overlying bowel gas. Spleen: Enlarged measuring up to 14.5 cm in length. Right Kidney: Length: 12 cm. Echogenicity within normal limits. There is no hydronephrosis. There is a 1.5 x 1.7 x 1.3 cm cyst in the right kidney. Left Kidney: Length: 12.2 cm. Echogenicity within normal  limits. There is no hydronephrosis. There is a 3.2 x 4.6 x 2.9 cm cyst in the left kidney. Abdominal aorta: The proximal abdominal aorta is visualized measuring up to 3.2 cm. Distal abdominal aorta not well seen. Other findings: None. IMPRESSION: 1. Cholelithiasis without sonographic evidence of acute cholecystitis. 2. Cirrhotic liver morphology. 3. Splenomegaly. 4. Bilateral renal cysts. 5. 3.2 cm abdominal aortic aneurysm. Recommend follow-up every 3 years. Electronically Signed   By: Darliss Cheney M.D.   On: 09/18/2023 17:07   ECHOCARDIOGRAM COMPLETE  Result Date: 09/18/2023    ECHOCARDIOGRAM REPORT   Patient Name:   Thomas Donovan Date of Exam: 09/18/2023 Medical Rec #:  [id number]        Height:       73.0 in Accession #:    [id number]       Weight:       296.9 lb Date of Birth:  05-17-1961         BSA:          2.545 m Patient Age:    62 years         BP:           126/84 mmHg Patient Gender: M                HR:           84 bpm. [facility] Procedure: 2D Echo, Cardiac Doppler and Color Doppler Indications:    Chest Pain R07.9  History:        Patient has prior history of Echocardiogram examinations, most                 recent 08/22/2017. CAD, Arrythmias:Atrial Fibrillation,                 Signs/Symptoms:Syncope; Risk Factors:Diabetes, Tobacco use and                 Hypertension. Alcohol use disorder, Cocaine abuse (HCC).  Sonographer:    Celesta Gentile RCS Referring Phys: [id number] [facility]  Sonographer Comments: Technically difficult study due to poor echo windows. Image acquisition challenging due to patient body habitus. IMPRESSIONS   1. Left ventricular ejection fraction, by estimation, is 60 to 65%. The left ventricle has normal function. The left ventricle has no regional wall motion abnormalities. There is mild left ventricular hypertrophy. Left ventricular diastolic parameters are indeterminate.  2. Right ventricular systolic function is normal. The right ventricular size is moderately enlarged.  3. Left atrial size was mildly dilated.  4. Right atrial size was mildly dilated.  5. The mitral valve is normal in structure. Trivial mitral valve regurgitation. No evidence of mitral stenosis.  6. The aortic valve has an indeterminant number of cusps. Aortic valve regurgitation is not visualized. No aortic stenosis is present.  7. Aortic dilatation noted.  8. The inferior vena cava is dilated in size with <50% respiratory variability, suggesting right atrial pressure of 15 mmHg. FINDINGS  Left Ventricle: Left ventricular ejection fraction, by estimation, is 60 to 65%. The left ventricle has normal function. The left ventricle has no regional wall motion abnormalities. Definity contrast agent was given IV to delineate the left ventricular  endocardial borders. The left ventricular internal cavity size was normal in size. There is mild left ventricular hypertrophy. Left ventricular diastolic parameters are indeterminate. Right Ventricle: The right ventricular size is moderately enlarged. Right vetricular wall thickness was not well visualized. Right ventricular systolic function is normal. Left Atrium:  Left atrial size was mildly dilated. Right Atrium: Right atrial size was mildly dilated. Pericardium: There is no evidence of pericardial effusion. Mitral Valve: The mitral valve is normal in structure. Trivial mitral valve regurgitation. No evidence of mitral valve stenosis. Tricuspid Valve: The tricuspid valve is normal in structure. Tricuspid valve regurgitation is trivial. No evidence of tricuspid stenosis. Aortic Valve: The aortic valve has an  indeterminant number of cusps. Aortic valve regurgitation is not visualized. No aortic stenosis is present. Aortic valve mean gradient measures 3.1 mmHg. Aortic valve peak gradient measures 5.8 mmHg. Aortic valve area, by VTI measures 2.97 cm. Pulmonic Valve: The pulmonic valve was not well visualized. Pulmonic valve regurgitation is not visualized. No evidence of pulmonic stenosis. Aorta: Aortic dilatation noted and the aortic root is normal in size and structure. Venous: The inferior vena cava is dilated in size with less than 50% respiratory variability, suggesting right atrial pressure of 15 mmHg. IAS/Shunts: The interatrial septum was not well visualized.  LEFT VENTRICLE PLAX 2D LVIDd:         5.10 cm LVIDs:         3.00 cm LV PW:         1.20 cm LV IVS:        1.20 cm LVOT diam:     2.20 cm LV SV:         65 LV SV Index:   25 LVOT Area:     3.80 cm  RIGHT VENTRICLE TAPSE (M-mode): 1.8 cm LEFT ATRIUM              Index        RIGHT ATRIUM           Index LA diam:        5.00 cm  1.96 cm/m   RA Area:     29.05 cm LA Vol (A2C):   76.9 ml  30.22 ml/m  RA Volume:   98.00 ml  38.51 ml/m LA Vol (A4C):   118.0 ml 46.37 ml/m LA Biplane Vol: 103.0 ml 40.47 ml/m  AORTIC VALVE AV Area (Vmax):    3.25 cm AV Area (Vmean):   2.99 cm AV Area (VTI):     2.97 cm AV Vmax:           120.64 cm/s AV Vmean:          82.375 cm/s AV VTI:            0.218 m AV Peak Grad:      5.8 mmHg AV Mean Grad:      3.1 mmHg LVOT Vmax:         103.00 cm/s LVOT Vmean:        64.800 cm/s LVOT VTI:          0.170 m LVOT/AV VTI ratio: 0.78  AORTA Ao Root diam: 4.00 cm MITRAL VALVE MV Area (PHT): 3.53 cm     SHUNTS MV Decel Time: 215 msec     Systemic VTI:  0.17 m MV E velocity: 126.00 cm/s  Systemic Diam: 2.20 cm Dina Rich MD Electronically signed by Dina Rich MD Signature Date/Time: 09/18/2023/4:10:02 PM    Final     Cardiac Studies    Patient Profile     62 y.o. male  tobacco use, diabetes type 2, CKD stage III, OSA  noncompliant with CPAP, cocaine use, paroxysmal A-fib, hypertension, dyslipidemia, obesity, COPD, noncompliance, leaving AMA who is being seen 09/18/2023 for the evaluation of atrial fibrillation with RVR  Assessment & Plan   1.Chest pain - long history of chest pain - Hgb 6.6 on presentation, cocaine + -2018 nuclear stress without ischemia. Discussions at that time to consider cath but patient declined. Never followed up with cards as outpatient  - 2022 nuclear stress [facility]: No ischemia - current symptoms are not cardiac, fullness midchest/epigastric lasting hours a time.    - in general not a cath candidate given recurrent transfusion dependent anemia. Very clear alternative etiologies for chest pain with severe anemia, cocaine +. Nuclear stress testing has been negative twice within the last 6 years. Recent chest pains noncardiac in description - echo this admit LVE 60-65%, no WMAs, indet diastolic fxn       2.Anemia - Hgb 6.6 on presentation - from [facility] notes multiple prior admits with anemia and multiple transfusions - reports hematuria, intermittent bright red blood per rectum as well - ferritin 18. Hemoccult + in ER.  - seen by GI, per note not candidate for procedures at this time due to positive drug screen, reconsider as outpatient.      3. Afib - prior history of afib per [facility] notes, has been on eliquis - admitted with symptomatic anemia Hgb 6.6, not an anticoag candidate at this time.  - would stay off eliquis given recurrent anemia - if establishes with consitent f/u could possibly consider watchman evaluation as outpatient.    -high rates yesterday, we changed his dilt to 60mg  tid - rates overall controlled, transition to oral dilt long acting 180mg  tomorrow (already got short acting today)     4.CKD - admit Cr 1.65 and stable   5. Chronic HFpEF - 03/2023 echo [facility]: LVE >55%, no significant valve pathology - monitor volume status with transfusions. Difficult to assess  by exma, bnp is pending. Consider reds vest tomorrow if starts to show signs of fluid overload   - BNP 158, IVC fixed and dilated. Reds vest 38% this morning.   - primary team started lasix gtt, we increased rate to 6mg /hr yesterday - negative 2.8 L yesterday, total I/Os are incomplete. Mild variation in Cr without clear trend.   - continue IV lasix today, likely can transition to oral tomorrow.        6. AAA  3.2 cm AAA noted on abd USneeds repeat study 3 years.   For questions or updates, please contact [facility] Please consult [email or web address] for contact info under        Signed, Dina Rich, MD  09/20/2023, 8:56 AM

## 2023-09-20 NOTE — Progress Notes (Signed)
Mobility Specialist Progress Note:    09/20/23 1430  Mobility  Activity Refused mobility   Pt politely refused mobility d/t ankle soreness. All needs met.   Lawerance Bach Mobility Specialist Please contact via Special educational needs teacher or  Rehab office at [phone]

## 2023-09-20 NOTE — Progress Notes (Signed)
Physical Therapy Treatment Patient Details Name: Thomas Donovan MRN: [id number] DOB: [birth year] Today's Date: 09/20/2023   History of Present Illness Thomas Donovan is a 62 y.o. male with medical history significant of A-fib with RVR, type 2 diabetes mellitus, chronic kidney disease stage III A, OSA (noncompliant with CPAP), polysubstance abuse (cocaine/alcohol), obesity who presents to the emergency department due to 6 to 8 months onset of sensation of dizziness mostly when he gets up to walk and this has been progressively worsening.  He complained of midsternal chest pain which started about 5 days ago.  Chest pain PTA was associated with nausea, diaphoresis, shortness of breath.  He admits to using cocaine and drinking alcohol PTA.   He complained of persistent blood in urine about 1.5 years ago, this lasted for a few months and self resolved, this reoccurred about 2 months ago for a few weeks, he states that he was was told at an ER that he may have prostate cancer, however, this was never worked up, nor had he been to any urologist.  He endorsed his urine being pink-tinged within the last 3 to 4 days.  He complained of urinary hesitancy and dribbling on urination.  Patient also endorsed of bright red blood per rectum whenever he takes NSAIDs.  Last episode was about 6 months ago.  Last colonoscopy was about 9 years ago, 2 large polyps were removed by patient, he was asked to return in 2 years, but he never followed up.    PT Comments  Patient demonstrates good return for bed mobility, transfers and ambulating using SPC without loss of balance with understanding of use acknowledged.  Patient encouraged to ambulate ad lib and with nursing staff for length of stay.  Plan:  Patient discharged from physical therapy to care of nursing for ambulation daily as tolerated for length of stay.     If plan is discharge home, recommend the following: A little help with walking and/or transfers;Help with stairs  or ramp for entrance;Assistance with cooking/housework   Can travel by private [employer] officer (single point cane (straight cane))    Recommendations for Other Services       Precautions / Restrictions Precautions Precautions: Fall Restrictions Weight Bearing Restrictions: No     Mobility  Bed Mobility Overal bed mobility: Independent                  Transfers Overall transfer level: Modified independent                      Ambulation/Gait Ambulation/Gait assistance: Modified independent (Device/Increase time) Gait Distance (Feet): 200 Feet Assistive device: Straight cane Gait Pattern/deviations: Step-to pattern, Decreased stance time - left, Antalgic Gait velocity: decreased     General Gait Details: demonstrates good return for using SPC with mostly step-to pattern due to left foot pain, no loss of balance with increased endurance/distance for ambulation   Stairs             Wheelchair Mobility     Tilt Bed    Modified Rankin (Stroke Patients Only)       Balance Overall balance assessment: Needs assistance Sitting-balance support: Feet unsupported, No upper extremity supported Sitting balance-Leahy Scale: Good Sitting balance - Comments: seated at EOB   Standing balance support: During functional activity, Single extremity supported Standing balance-Leahy Scale: Good Standing balance comment: using single point cane  Cognition Arousal: Alert Behavior During Therapy: WFL for tasks assessed/performed Overall Cognitive Status: Within Functional Limits for tasks assessed                                          Exercises      General Comments        Pertinent Vitals/Pain Pain Assessment Pain Assessment: Faces Faces Pain Scale: Hurts little more Pain Location: left foot due to swelling Pain Descriptors / Indicators: Discomfort,  Sore Pain Intervention(s): Limited activity within patient's tolerance, Monitored during session, Repositioned, Premedicated before session    Home Living                          Prior Function            PT Goals (current goals can now be found in the care plan section) Acute Rehab PT Goals Patient Stated Goal: return home PT Goal Formulation: With patient Time For Goal Achievement: 09/20/23 Potential to Achieve Goals: Good Progress towards PT goals: Goals met/education completed, patient discharged from PT    Frequency    Min 2X/week      PT Plan      Co-evaluation              AM-PAC PT "6 Clicks" Mobility   Outcome Measure  Help needed turning from your back to your side while in a flat bed without using bedrails?: None Help needed moving from lying on your back to sitting on the side of a flat bed without using bedrails?: None Help needed moving to and from a bed to a chair (including a wheelchair)?: None Help needed standing up from a chair using your arms (e.g., wheelchair or bedside chair)?: None Help needed to walk in hospital room?: None Help needed climbing 3-5 steps with a railing? : A Little 6 Click Score: 23    End of Session   Activity Tolerance: Patient tolerated treatment well Patient left: in chair;with call bell/phone within reach Nurse Communication: Mobility status PT Visit Diagnosis: Unsteadiness on feet (R26.81);Other abnormalities of gait and mobility (R26.89);Muscle weakness (generalized) (M62.81)     Time: 2952-8413 PT Time Calculation (min) (ACUTE ONLY): 19 min  Charges:    $Gait Training: 8-22 mins PT General Charges $$ ACUTE PT VISIT: 1 Visit                     12:21 PM, 09/20/23 Ocie Bob, MPT Physical Therapist with [facility] 336 [phone] office [phone] mobile phone

## 2023-09-20 NOTE — Progress Notes (Signed)
[facility] Progress Note  Patient: Thomas Donovan    EPP:[id number]  DOA: 09/17/2023     Date of Service: the patient was seen and examined on 09/20/2023  Chief Complaint  Patient presents with   Chest Pain   Brief hospital course: Thomas Donovan is a 62 y.o. male with medical history significant of A-fib with RVR, type 2 diabetes mellitus, chronic kidney disease stage III A, OSA (noncompliant with CPAP), polysubstance abuse (cocaine/alcohol), obesity who presents to the emergency department due to 6 to 8 months onset of sensation of dizziness mostly when he gets up to walk and this has been progressively worsening.  He complained of midsternal chest pain which started about 5 days ago.  Chest pain PTA was associated with nausea, diaphoresis, shortness of breath.  He admits to using cocaine and drinking alcohol PTA.  He complained of persistent blood in urine about 1.5 years ago, this lasted for a few months and self resolved, this reoccurred about 2 months ago for a few weeks, he states that he was was told at an ER that he may have prostate cancer, however, this was never worked up, nor had he been to any urologist.  He endorsed his urine being pink-tinged within the last 3 to 4 days.  He complained of urinary hesitancy and dribbling on urination. Patient also endorsed of bright red blood per rectum whenever he takes NSAIDs.  Last episode was about 6 months ago.  Last colonoscopy was about 9 years ago, 2 large polyps were removed by patient, he was asked to return in 2 years, but he never followed up. Patient states that he has had about 6 blood transfusions since December of last year.   ED Course: Pt was tachypneic, tachycardic, BP 122/90, other vital signs were within normal range.   Hb 6.6 (hemoglobin on 07/24/2023 was 7.4 -Care Everywhere), hematocrit 23.5, MCV 78.1, platelets 322.  BMP was normal except for sodium of 133, bicarb 21, glucose 173, creatinine 1.65 (baseline creatinine  at 1.5 - Care Everywhere), albumin 3.3, alcohol level 120, urine drug screen was positive for cocaine, troponin x 2 - 8 > 9, folate 5.0.  FOBT was positive per EDP. Chest x-ray showed no active disease Percocet was given.  1 units of PRBC, pt developed sweating so 2nd unit was given as per pt Hospitalist was asked to admit patient for further evaluation and management.   Assessment and Plan: Principal Problem:   Symptomatic anemia Active Problems:   Atypical chest pain   GERD (gastroesophageal reflux disease)   Uncontrolled type 2 diabetes mellitus with hyperglycemia, with long-term current use of insulin (HCC)   Acute on chronic blood loss anemia   GI bleed   Alcohol use disorder   Cocaine abuse (HCC)   Chronic kidney disease, stage 3a (HCC)   BPH (benign prostatic hyperplasia)   Hematuria   # Symptomatic anemia Acute on chronic blood loss anemia GI bleed Hemoglobin 6.6 (hemoglobin on 07/24/2023 was 7.4 -Care Everywhere) Hemoccult was positive per EDP Patient states that he takes Eliquis and also takes NSAIDs sometimes, these could have contributed to patient's GI bleed. S/p 1 U prbc, Hb 8.2 posttransfusion Continue IV Protonix 40 mg twice daily GI consulted, patient is not a candidate for scope at this time due to cocaine abuse, discussed with anesthesia.  GI will arrange EGD and colonoscopy as an outpatient. 9/24 Hb 8.7 stable  Iron deficiency, transferrin saturation 4% Ferric gluconate to 50 mg IV one-time dose  given Start oral iron supplement on discharge Follow with PCP to repeat iron profile after 3 to 6 months  Vitamin B12 deficiency, B12 level 166, goal >400, started vitamin B12 1000 mcg IM injection daily during hospital stay followed by oral supplement on discharge. Follow-up with PCP to repeat B12 level after 3 to 6 months.  Folic acid deficiency, started folic acid 1 mg p.o. daily.  Follow with PCP to repeat folic acid level after 3 to 6 months.  Acute Gout  -  secondary to IV lasix diuresis - resumed home colchicine with instructions to take 1 dose now  Chronic Atrial Fibrillation with RVR Med rec shows patient was on Cardizem and Eliquis Resumed Cardizem 30 mg p.o. every 6 hourly Held Eliquis for now due to GI bleeding Follow cardiology consult for further recommendation TTE reviewed as below  Chronic HFpEF Continue total input/output, daily weights and fluid restriction Echocardiogram done in August, 2018 showed LVEF of 65 to 70%, G1 DD.   Significant edema of bilateral lower extremities, started Lasix IV infusion Monitor renal functions daily and urine output Follow TTE LVEF 60 to 65%, no wall motion abnormality.  Mild LV hypertrophy. Cardiology consulted as above  Intake/Output Summary (Last 24 hours) at 09/20/2023 1647 Last data filed at 09/20/2023 1400 Gross per 24 hour  Intake 7339.88 ml  Output 5325 ml  Net 2014.88 ml   Filed Weights   09/18/23 0225 09/19/23 0339 09/20/23 0341  Weight: 134.7 kg 133.9 kg 134.5 kg   Atypical chest pain This may be related to cocaine abuse Troponin x 4 was normal He no longer complains of chest pain, though still complains of chronic back pain Started Imdur 30 mg p.o. daily  Liver cirrhosis Ultrasound abdomen shows liver cirrhosis Patient was recommended to follow-up with GI as an outpatient for possible liver transplant if he is able to quit alcohol for at least 6 months  Cocaine abuse Urine drug screen was positive for cocaine Patient was counseled on cocaine abuse cessation   Alcohol use disorder Alcohol level was 120, was counseled on alcohol abuse disorder Continue to monitor on CIWA protocol   CKD stage IIIA Creatinine 1.65 (baseline creatinine at 1.5 Renally adjust medications, avoid nephrotoxic agents/dehydration/hypotension   Hypoalbuminemia most likely secondary to liver cirrhosis Albumin 3.3, protein supplement will be provided   Chronic back pain Started Tylenol 650 mg  p.o. 3 times daily scheduled, gabapentin 300 mg p.o. 3 times daily, Dilaudid oral and IV as needed Continue PT/OT eval and treat   Uncontrolled type 2 diabetes mellitus with hyperglycemia Last A1c in May 2023 was 7.0 -care everywhere Continue ISS and hypoglycemia protocol Metformin and glipizide will be held at this time Hemoglobin A1c will be checked   COPD, no exacerbation noticed on exam Continue home inhalers OSA secondary to obesity, continue CPAP at nighttime Patient was recommended to follow-up with pulmonologist for sleep study as an outpatient  GERD Continue Protonix   BPH Patient states that he ran out of his Flomax a few days ago and has been having difficulty in urination Continue Flomax   Hematuria Patient reported history of hematuria.  Patient will need outpatient urology consult for further workup   Body mass index is 39.12 kg/m.  Interventions:  Diet: Heart healthy diet DVT Prophylaxis: SCD, pharmacological prophylaxis contraindicated due to GI bleeding     Advance goals of care discussion: Full code  Family Communication: family was not present at bedside, at the time of interview.  The  pt provided permission to discuss medical plan with the family. Opportunity was given to ask question and all questions were answered satisfactorily.   Disposition:  Pt is from this community /Homeless, admitted with symptomatic anemia, and A-fib with RVR, lower extremity edema, started on Lasix IV infusion, which precludes a safe discharge. Discharge to home, when stable, may need few days to stay in the hospital..  Subjective: No significant events overnight, patient has sleep apnea, he was using CPAP.  Lower extremity edema is getting better.  No alcohol withdrawal symptoms today.   Physical Exam: Physical Exam Constitutional:      General: He is not in acute distress.    Appearance: He is well-developed. He is obese. He is not toxic-appearing.  HENT:     Head:  Normocephalic and atraumatic.  Eyes:     Extraocular Movements: Extraocular movements intact.     Pupils: Pupils are equal, round, and reactive to light.  Neck:     Vascular: JVD present.  Cardiovascular:     Rate and Rhythm: Normal rate. Rhythm irregular.     Heart sounds: Heart sounds are distant. No murmur heard. Pulmonary:     Effort: Pulmonary effort is normal.  Chest:     Chest wall: Edema present.  Abdominal:     General: Bowel sounds are normal.     Palpations: Abdomen is soft.  Musculoskeletal:        General: Normal range of motion.     Cervical back: Normal range of motion.     Right lower leg: Tenderness present. Edema present.     Left lower leg: Tenderness present. Edema present.  Skin:    General: Skin is warm and dry.  Neurological:     General: No focal deficit present.     Mental Status: He is alert.  Psychiatric:        Mood and Affect: Mood normal.    Vitals:   09/20/23 0803 09/20/23 1000 09/20/23 1353 09/20/23 1414  BP:   135/67 117/65  Pulse:  99 98 (!) 102  Resp:      Temp:   98.1 F (36.7 C) 97.9 F (36.6 C)  TempSrc:   Oral Oral  SpO2: 93%  100% 96%  Weight:      Height:        Intake/Output Summary (Last 24 hours) at 09/20/2023 1644 Last data filed at 09/20/2023 1400 Gross per 24 hour  Intake 7339.88 ml  Output 5325 ml  Net 2014.88 ml   Filed Weights   09/18/23 0225 09/19/23 0339 09/20/23 0341  Weight: 134.7 kg 133.9 kg 134.5 kg    Data Reviewed: I have personally reviewed and interpreted daily labs, tele strips, imagings as discussed above. I reviewed all nursing notes, pharmacy notes, vitals, pertinent old records I have discussed plan of care as described above with RN and patient/family.  CBC: Recent Labs  Lab 09/17/23 2325 09/18/23 0722 09/18/23 1801 09/19/23 0417 09/20/23 0423  WBC 6.2 6.0  --  7.3 7.3  NEUTROABS 3.8  --   --   --   --   HGB 6.6* 8.2* 8.4* 8.7* 8.9*  HCT 23.5* 27.7* 29.1* 29.8* 32.2*  MCV 78.1*  78.5*  --  80.3 82.4  PLT 322 322  --  357 327   Basic Metabolic Panel: Recent Labs  Lab 09/17/23 2325 09/18/23 0722 09/19/23 0417 09/20/23 0423  NA 133* 136 137 135  K 3.8 4.2 3.9 3.9  CL 99 102 101 95*  CO2 21* 23 26 27   GLUCOSE 173* 212* 177* 186*  BUN 17 18 16 17   CREATININE 1.65* 1.63* 1.41* 1.51*  CALCIUM 8.5* 8.6* 9.1 9.0  MG  --  1.8 1.6* 1.7  PHOS  --  3.7 3.1 3.7    Studies: No results found.  Scheduled Meds:  acetaminophen  650 mg Oral TID   cyanocobalamin  1,000 mcg Intramuscular Daily   Followed by   Melene Muller ON 09/25/2023] vitamin B-12  1,000 mcg Oral Daily   [START ON 09/21/2023] diltiazem  180 mg Oral Daily   diltiazem  60 mg Oral TID   feeding supplement (GLUCERNA SHAKE)  237 mL Oral TID BM   fluticasone furoate-vilanterol  1 puff Inhalation Daily   And   umeclidinium bromide  1 puff Inhalation Daily   folic acid  1 mg Oral Daily   gabapentin  600 mg Oral TID   insulin aspart  0-9 Units Subcutaneous TID WC   insulin aspart  5 Units Subcutaneous TID WC   isosorbide mononitrate  30 mg Oral Daily   magnesium oxide  400 mg Oral BID   multivitamin with minerals  1 tablet Oral Daily   pantoprazole (PROTONIX) IV  40 mg Intravenous Q12H   tamsulosin  0.4 mg Oral Daily   thiamine  100 mg Oral Daily   Or   thiamine  100 mg Intravenous Daily   Continuous Infusions:  furosemide (LASIX) 200 mg in dextrose 5 % 100 mL (2 mg/mL) infusion 6 mg/hr (09/20/23 1123)   PRN Meds: albuterol, colchicine, diclofenac Sodium, diphenhydrAMINE-zinc acetate, HYDROmorphone (DILAUDID) injection, HYDROmorphone, LORazepam **OR** LORazepam, ondansetron **OR** ondansetron (ZOFRAN) IV  Time spent: 55 minutes  Author: Standley Dakins MD [facility] 09/20/2023 4:44 PM  To reach On-call, see care teams to locate the attending and reach out to them via [email or web address]. If 7PM-7AM, please contact night-coverage If you still have difficulty reaching the attending provider, please  page the [facility] (Director on Call) for [facility] on amion for assistance.

## 2023-09-20 NOTE — Progress Notes (Signed)
Patient complaining of pain in ankle and foot. PRN pain medication given and MD notified. MD at bedside. No new orders at this time.

## 2023-09-21 DIAGNOSIS — I5033 Acute on chronic diastolic (congestive) heart failure: Secondary | ICD-10-CM

## 2023-09-21 DIAGNOSIS — F1410 Cocaine abuse, uncomplicated: Secondary | ICD-10-CM | POA: Diagnosis not present

## 2023-09-21 DIAGNOSIS — D649 Anemia, unspecified: Secondary | ICD-10-CM | POA: Diagnosis not present

## 2023-09-21 DIAGNOSIS — I482 Chronic atrial fibrillation, unspecified: Secondary | ICD-10-CM | POA: Diagnosis not present

## 2023-09-21 DIAGNOSIS — E1165 Type 2 diabetes mellitus with hyperglycemia: Secondary | ICD-10-CM | POA: Diagnosis not present

## 2023-09-21 DIAGNOSIS — Z7901 Long term (current) use of anticoagulants: Secondary | ICD-10-CM

## 2023-09-21 LAB — CBC
HCT: 29.2 % — ABNORMAL LOW (ref 39.0–52.0)
HCT: 28.1 % — ABNORMAL LOW (ref 39.0–52.0)
Hemoglobin: 8.3 g/dL — ABNORMAL LOW (ref 13.0–17.0)
Hemoglobin: 8.3 g/dL — ABNORMAL LOW (ref 13.0–17.0)
MCH: 23 pg — ABNORMAL LOW (ref 26.0–34.0)
MCH: 24 pg — ABNORMAL LOW (ref 26.0–34.0)
MCHC: 28.4 g/dL — ABNORMAL LOW (ref 30.0–36.0)
MCHC: 29.5 g/dL — ABNORMAL LOW (ref 30.0–36.0)
MCV: 80.9 fL (ref 80.0–100.0)
MCV: 81.2 fL (ref 80.0–100.0)
Platelets: 312 10*3/uL (ref 150–400)
Platelets: 308 10*3/uL (ref 150–400)
RBC: 3.61 MIL/uL — ABNORMAL LOW (ref 4.22–5.81)
RBC: 3.46 MIL/uL — ABNORMAL LOW (ref 4.22–5.81)
RDW: 20.8 % — ABNORMAL HIGH (ref 11.5–15.5)
RDW: 21 % — ABNORMAL HIGH (ref 11.5–15.5)
WBC: 7 10*3/uL (ref 4.0–10.5)
WBC: 7.4 10*3/uL (ref 4.0–10.5)
nRBC: 0 % (ref 0.0–0.2)
nRBC: 0 % (ref 0.0–0.2)

## 2023-09-21 LAB — BASIC METABOLIC PANEL
Anion gap: 11 (ref 5–15)
BUN: 21 mg/dL (ref 8–23)
CO2: 26 mmol/L (ref 22–32)
Calcium: 8.4 mg/dL — ABNORMAL LOW (ref 8.9–10.3)
Chloride: 97 mmol/L — ABNORMAL LOW (ref 98–111)
Creatinine, Ser: 1.64 mg/dL — ABNORMAL HIGH (ref 0.61–1.24)
GFR, Estimated: 47 mL/min — ABNORMAL LOW (ref >60)
Glucose, Bld: 263 mg/dL — ABNORMAL HIGH (ref 70–99)
Potassium: 3.5 mmol/L (ref 3.5–5.1)
Sodium: 134 mmol/L — ABNORMAL LOW (ref 135–145)

## 2023-09-21 LAB — MAGNESIUM: Magnesium: 1.7 mg/dL (ref 1.7–2.4)

## 2023-09-21 LAB — PHOSPHORUS: Phosphorus: 3.9 mg/dL (ref 2.5–4.6)

## 2023-09-21 LAB — GLUCOSE, CAPILLARY
Glucose-Capillary: 279 mg/dL — ABNORMAL HIGH (ref 70–99)
Glucose-Capillary: 205 mg/dL — ABNORMAL HIGH (ref 70–99)
Glucose-Capillary: 237 mg/dL — ABNORMAL HIGH (ref 70–99)

## 2023-09-21 MED ORDER — MAGNESIUM SULFATE 2 GM/50ML IV SOLN
2.0000 g | Freq: Once | INTRAVENOUS | Status: AC
  Administered 2023-09-21: 2 g via INTRAVENOUS
  Filled 2023-09-21: qty 50

## 2023-09-21 MED ORDER — INSULIN GLARGINE-YFGN 100 UNIT/ML ~~LOC~~ SOLN
14.0000 [IU] | Freq: Every day | SUBCUTANEOUS | Status: DC
  Administered 2023-09-21: 14 [IU] via SUBCUTANEOUS
  Filled 2023-09-21 (×2): qty 0.14

## 2023-09-21 MED ORDER — POTASSIUM CHLORIDE CRYS ER 20 MEQ PO TBCR
40.0000 meq | EXTENDED_RELEASE_TABLET | Freq: Four times a day (QID) | ORAL | Status: DC
  Administered 2023-09-21: 40 meq via ORAL
  Filled 2023-09-21: qty 2

## 2023-09-21 MED ORDER — CHLORDIAZEPOXIDE HCL 5 MG PO CAPS
25.0000 mg | ORAL_CAPSULE | Freq: Three times a day (TID) | ORAL | Status: DC
  Administered 2023-09-21: 25 mg via ORAL
  Filled 2023-09-21: qty 5

## 2023-09-21 MED ORDER — GUAIFENESIN-DM 100-10 MG/5ML PO SYRP
5.0000 mL | ORAL_SOLUTION | ORAL | Status: DC | PRN
  Administered 2023-09-21: 5 mL via ORAL
  Filled 2023-09-21: qty 5

## 2023-09-21 NOTE — Progress Notes (Signed)
This nurse was called into the room due to patient becoming severely agitated and cursing at staff while using derogatory remarks, pt stated to another nurse who went to assist him "I didn't call for you to come in here Nigger". The patient stated he was leaving, AMA papers given. The nurse contacted the MD to inform him.This nurse explained to the patient the risks of him leaving, including increase in swelling. Patient stated "I don't give a damn I will not be staying here" Patient refused to sign AMA paperwork. Pt ambulated self out of hospital.

## 2023-09-21 NOTE — Progress Notes (Signed)
Patient left AMA, aggressive with staff , cussing because he got clear liquid diet . Attempt to spit at RN , cussing at [place] . His IV was removed..Patient educated on the importance of his medical condition

## 2023-09-21 NOTE — Discharge Summary (Signed)
Physician Discharge Summary  Thomas Donovan FYB:[id number] DOB: [birth year] DOA: 09/17/2023  PCP: Carmel Sacramento, NP  Admit date: 09/17/2023 Discharge date: 09/21/2023  Disposition:   PATIENT DISCHARGED AGAINST MEDICAL ADVICE   Recommendations for Outpatient Follow-up:  Follow up with PCP in 1 weeks Follow up with cardiology in 1-2 weeks  Discharge Condition: DC'd Against Medical Advice   CODE STATUS: FULL DIET: 2 gm sodium restricted   Brief Hospitalization Summary: Please see all hospital notes, images, labs for full details of the hospitalization. Admission provider HPI:  62 y.o. male with medical history significant of A-fib with RVR, type 2 diabetes mellitus, chronic kidney disease stage III A, OSA (noncompliant with CPAP), polysubstance abuse (cocaine/alcohol), obesity who presents to the emergency department due to 6 to 8 months onset of sensation of dizziness mostly when he gets up to walk and this has been progressively worsening.  He complained of midsternal chest pain which started about 5 days ago.  Chest pain PTA was associated with nausea, diaphoresis, shortness of breath.  He admits to using cocaine and drinking alcohol PTA.   He complained of persistent blood in urine about 1.5 years ago, this lasted for a few months and self resolved, this reoccurred about 2 months ago for a few weeks, he states that he was was told at an ER that he may have prostate cancer, however, this was never worked up, nor had he been to any urologist.  He endorsed his urine being pink-tinged within the last 3 to 4 days.  He complained of urinary hesitancy and dribbling on urination.  Patient also endorsed of bright red blood per rectum whenever he takes NSAIDs.  Last episode was about 6 months ago.  Last colonoscopy was about 9 years ago, 2 large polyps were removed by patient, he was asked to return in 2 years, but he never followed up.  Patient states that he has had about 6 blood transfusions since December  of last year.   ED Course: Pt was tachypneic, tachycardic, BP 122/90, other vital signs were within normal range.  Hb 6.6 (hemoglobin on 07/24/2023 was 7.4 -Care Everywhere), hematocrit 23.5, MCV 78.1, platelets 322.  BMP was normal except for sodium of 133, bicarb 21, glucose 173, creatinine 1.65 (baseline creatinine at 1.5 - Care Everywhere), albumin 3.3, alcohol level 120, urine drug screen was positive for cocaine, troponin x 2 - 8 > 9, folate 5.0.  FOBT was positive per EDP.  Chest x-ray showed no active disease.  Percocet was given.  1 units of PRBC, pt developed sweating so 2nd unit was given as per pt  Hospitalist was asked to admit patient for further evaluation and management.   Hospital Course  This patient was admitted with symptomatic anemia with a hemoglobin of 6.6 requiring 1 unit PRBC transfusion.  He was cocaine positive and GI decided that after discussing with anesthesia that he should have a EGD and colonoscopy as an outpatient after he has been 2 weeks free of recreational drug use.  He was advised to take a daily iron supplement.  He was treated with pantoprazole.  His apixaban was held.  He was also treated by the cardiology service for chronic HFpEF.  He was massively volume overloaded and treated with an IV Lasix infusion.  He diuresed massive amounts of fluid.  He was also treated for elevated heart rates associated with atrial fibrillation.  He was on his alcohol withdrawal protocol as well due to his very high alcohol consumption  and high risk for acute delirium tremens.  He reported this morning that he had a large bloody bowel movement and wanted me to notify the GI service.  I notified the GI service and after discussion they recommended that he be on a clear liquid diet in case I decision will need to be made for emergent endoscopy.  He is high risk for bleeding given his cirrhosis disease.  Unfortunately patient became severely agitated and confrontational with nursing staff and  demanded to leave immediately.  He shouted expletives at the nursing staff and after counseling with him he has adamantly refused further care.  My examination of him in my opinion he has full decisional capacity to make his own medical decisions at this time.  He is not delusional and he is not impaired.  Pt left the hospital of his own free will without further care.  HE IS HIGH RISK FOR READMISSION GIVEN HIS ONGOING DRUG AND ALCOHOL USE AND POOR COMPLIANCE WITH MEDICAL TREATMENT. PT VERBALIZED TO ME UNDERSTANDING THE RISKS OF LEAVING AGAINST MEDICAL ADVICE.   Discharge Diagnoses:  Principal Problem:   Symptomatic anemia Active Problems:   Atypical chest pain   GERD (gastroesophageal reflux disease)   Uncontrolled type 2 diabetes mellitus with hyperglycemia, with long-term current use of insulin (HCC)   Acute on chronic blood loss anemia   GI bleed   Alcohol use disorder   Cocaine abuse (HCC)   Chronic kidney disease, stage 3a (HCC)   BPH (benign prostatic hyperplasia)   Hematuria   Atrial fibrillation, chronic (HCC)   Guaiac positive stools   Acute drug-induced gout of left foot   Chronic anticoagulation   Discharge Instructions:    Allergies  Allergen Reactions   Celebrex [Celecoxib] Anaphylaxis   Aspirin Other (See Comments)    Medication is contraindicated with pts gout medicine.     Ibuprofen Other (See Comments)    Reaction:  GI bleeding    Toradol [Ketorolac Tromethamine] Other (See Comments)    Reaction:  Blisters between fingers    Tramadol Hives          Procedures/Studies: US LIVER DOPPLER  Result Date: 09/19/2023 CLINICAL DATA:  Cirrhosis. EXAM: DUPLEX ULTRASOUND OF LIVER TECHNIQUE: Color and duplex Doppler ultrasound was performed to evaluate the hepatic in-flow and out-flow vessels. COMPARISON:  Ultrasound of the abdomen performed separately today. FINDINGS: Liver: Morphology of the liver is consistent with hepatic cirrhosis. No focal lesion, mass or  intrahepatic biliary ductal dilatation. Main Portal Vein size: 1.3 cm Portal Vein Velocities Main Prox:  67 cm/sec Main Mid: 47 cm/sec Main Dist:  36 cm/sec Right: 39 cm/sec Left: 27 cm/sec Hepatic Vein Velocities Right:  47 cm/sec Middle:  86 cm/sec Left:  116 cm/sec IVC: Present and patent with normal respiratory phasicity. Hepatic Artery Velocity:  50 cm/sec Splenic Vein Velocity:   cm/sec Spleen: 14.5 cm x 5.3 cm x 16.2 cm with a total volume of 651 cm^3 (411 cm^3 is upper limit normal) Portal Vein Occlusion/Thrombus: No Splenic Vein Occlusion/Thrombus: No Ascites: None Varices: None Normal direction of portal vein flow towards the liver with normal waveforms. Hepatic vein waveforms are within normal limits. IMPRESSION: 1. Morphology of the liver consistent with hepatic cirrhosis. No focal liver lesion identified. 2. Splenomegaly. 3. Patent portal vein with normal direction of flow towards the liver. No evidence of portal vein thrombus. 4. Normal measured portal vein velocities without significant depression to suggest significant portal hypertension by duplex ultrasound. Electronically Signed   By:  Irish Lack M.D.   On: 09/19/2023 07:54   US Abdomen Complete  Result Date: 09/18/2023 CLINICAL DATA:  Right upper quadrant pain EXAM: ABDOMEN ULTRASOUND COMPLETE COMPARISON:  CT abdomen and pelvis 07/06/2023 FINDINGS: Gallbladder: Gallstones are visualized measuring up to 7 mm. There is no gallbladder wall thickening or pericholecystic fluid. No sonographic Murphy sign noted by sonographer. Common bile duct: Diameter: 5 mm Liver: Lobular contour. No focal lesion identified. There is increase in parenchymal echogenicity. Portal vein is patent on color Doppler imaging with normal direction of blood flow towards the liver. IVC: No abnormality visualized. Pancreas: Not visualized secondary to overlying bowel gas. Spleen: Enlarged measuring up to 14.5 cm in length. Right Kidney: Length: 12 cm. Echogenicity within  normal limits. There is no hydronephrosis. There is a 1.5 x 1.7 x 1.3 cm cyst in the right kidney. Left Kidney: Length: 12.2 cm. Echogenicity within normal limits. There is no hydronephrosis. There is a 3.2 x 4.6 x 2.9 cm cyst in the left kidney. Abdominal aorta: The proximal abdominal aorta is visualized measuring up to 3.2 cm. Distal abdominal aorta not well seen. Other findings: None. IMPRESSION: 1. Cholelithiasis without sonographic evidence of acute cholecystitis. 2. Cirrhotic liver morphology. 3. Splenomegaly. 4. Bilateral renal cysts. 5. 3.2 cm abdominal aortic aneurysm. Recommend follow-up every 3 years. Electronically Signed   By: Darliss Cheney M.D.   On: 09/18/2023 17:07   ECHOCARDIOGRAM COMPLETE  Result Date: 09/18/2023    ECHOCARDIOGRAM REPORT   Patient Name:   Thomas Donovan Date of Exam: 09/18/2023 Medical Rec #:  [id number]        Height:       73.0 in Accession #:    [id number]       Weight:       296.9 lb Date of Birth:  1961/01/01         BSA:          2.545 m Patient Age:    62 years         BP:           126/84 mmHg Patient Gender: M                HR:           84 bpm. [facility] Procedure: 2D Echo, Cardiac Doppler and Color Doppler Indications:    Chest Pain R07.9  History:        Patient has prior history of Echocardiogram examinations, most                 recent 08/22/2017. CAD, Arrythmias:Atrial Fibrillation,                 Signs/Symptoms:Syncope; Risk Factors:Diabetes, Tobacco use and                 Hypertension. Alcohol use disorder, Cocaine abuse (HCC).  Sonographer:    Celesta Gentile RCS Referring Phys: [id number] [facility]  Sonographer Comments: Technically difficult study due to poor echo windows. Image acquisition challenging due to patient body habitus. IMPRESSIONS  1. Left ventricular ejection fraction, by estimation, is 60 to 65%. The left ventricle has normal function. The left ventricle has no regional wall motion abnormalities. There is mild left ventricular  hypertrophy. Left ventricular diastolic parameters are indeterminate.  2. Right ventricular systolic function is normal. The right ventricular size is moderately enlarged.  3. Left atrial size was mildly dilated.  4. Right atrial size was mildly dilated.  5. The mitral valve is normal in structure. Trivial mitral valve regurgitation. No evidence of mitral stenosis.  6. The aortic valve has an indeterminant number of cusps. Aortic valve regurgitation is not visualized. No aortic stenosis is present.  7. Aortic dilatation noted.  8. The inferior vena cava is dilated in size with <50% respiratory variability, suggesting right atrial pressure of 15 mmHg. FINDINGS  Left Ventricle: Left ventricular ejection fraction, by estimation, is 60 to 65%. The left ventricle has normal function. The left ventricle has no regional wall motion abnormalities. Definity contrast agent was given IV to delineate the left ventricular  endocardial borders. The left ventricular internal cavity size was normal in size. There is mild left ventricular hypertrophy. Left ventricular diastolic parameters are indeterminate. Right Ventricle: The right ventricular size is moderately enlarged. Right vetricular wall thickness was not well visualized. Right ventricular systolic function is normal. Left Atrium: Left atrial size was mildly dilated. Right Atrium: Right atrial size was mildly dilated. Pericardium: There is no evidence of pericardial effusion. Mitral Valve: The mitral valve is normal in structure. Trivial mitral valve regurgitation. No evidence of mitral valve stenosis. Tricuspid Valve: The tricuspid valve is normal in structure. Tricuspid valve regurgitation is trivial. No evidence of tricuspid stenosis. Aortic Valve: The aortic valve has an indeterminant number of cusps. Aortic valve regurgitation is not visualized. No aortic stenosis is present. Aortic valve mean gradient measures 3.1 mmHg. Aortic valve peak gradient measures 5.8 mmHg.  Aortic valve area, by VTI measures 2.97 cm. Pulmonic Valve: The pulmonic valve was not well visualized. Pulmonic valve regurgitation is not visualized. No evidence of pulmonic stenosis. Aorta: Aortic dilatation noted and the aortic root is normal in size and structure. Venous: The inferior vena cava is dilated in size with less than 50% respiratory variability, suggesting right atrial pressure of 15 mmHg. IAS/Shunts: The interatrial septum was not well visualized.  LEFT VENTRICLE PLAX 2D LVIDd:         5.10 cm LVIDs:         3.00 cm LV PW:         1.20 cm LV IVS:        1.20 cm LVOT diam:     2.20 cm LV SV:         65 LV SV Index:   25 LVOT Area:     3.80 cm  RIGHT VENTRICLE TAPSE (M-mode): 1.8 cm LEFT ATRIUM              Index        RIGHT ATRIUM           Index LA diam:        5.00 cm  1.96 cm/m   RA Area:     29.05 cm LA Vol (A2C):   76.9 ml  30.22 ml/m  RA Volume:   98.00 ml  38.51 ml/m LA Vol (A4C):   118.0 ml 46.37 ml/m LA Biplane Vol: 103.0 ml 40.47 ml/m  AORTIC VALVE AV Area (Vmax):    3.25 cm AV Area (Vmean):   2.99 cm AV Area (VTI):     2.97 cm AV Vmax:           120.64 cm/s AV Vmean:          82.375 cm/s AV VTI:            0.218 m AV Peak Grad:      5.8 mmHg AV Mean Grad:      3.1 mmHg LVOT  Vmax:         103.00 cm/s LVOT Vmean:        64.800 cm/s LVOT VTI:          0.170 m LVOT/AV VTI ratio: 0.78  AORTA Ao Root diam: 4.00 cm MITRAL VALVE MV Area (PHT): 3.53 cm     SHUNTS MV Decel Time: 215 msec     Systemic VTI:  0.17 m MV E velocity: 126.00 cm/s  Systemic Diam: 2.20 cm Dina Rich MD Electronically signed by Dina Rich MD Signature Date/Time: 09/18/2023/4:10:02 PM    Final    DG Chest Port 1 View  Result Date: 09/17/2023 CLINICAL DATA:  Near syncope chest pain EXAM: PORTABLE CHEST 1 VIEW COMPARISON:  07/03/2018 FINDINGS: The heart size and mediastinal contours are within normal limits. Both lungs are clear. The visualized skeletal structures are unremarkable. IMPRESSION: No active  disease. Electronically Signed   By: Jasmine Pang M.D.   On: 09/17/2023 23:59    Discharge Exam: Vitals:   09/21/23 0825 09/21/23 1121  BP:  129/74  Pulse:  100  Resp:  19  Temp:  98.7 F (37.1 C)  SpO2: 98% 95%   Vitals:   09/21/23 0304 09/21/23 0500 09/21/23 0825 09/21/23 1121  BP: 139/76   129/74  Pulse: 82   100  Resp:    19  Temp: 98.4 F (36.9 C)   98.7 F (37.1 C)  TempSrc: Oral   Oral  SpO2: 99%  98% 95%  Weight:  134.5 kg    Height:         The results of significant diagnostics from this hospitalization (including imaging, microbiology, ancillary and laboratory) are listed below for reference.     Microbiology: No results found for this or any previous visit (from the past 240 hour(s)).   Labs: BNP (last 3 results) Recent Labs    09/18/23 0942  BNP 158.0*   Basic Metabolic Panel: Recent Labs  Lab 09/17/23 2325 09/18/23 0722 09/19/23 0417 09/20/23 0423 09/21/23 0410  NA 133* 136 137 135 134*  K 3.8 4.2 3.9 3.9 3.5  CL 99 102 101 95* 97*  CO2 21* 23 26 27 26   GLUCOSE 173* 212* 177* 186* 263*  BUN 17 18 16 17 21   CREATININE 1.65* 1.63* 1.41* 1.51* 1.64*  CALCIUM 8.5* 8.6* 9.1 9.0 8.4*  MG  --  1.8 1.6* 1.7 1.7  PHOS  --  3.7 3.1 3.7 3.9   Liver Function Tests: Recent Labs  Lab 09/17/23 2325 09/18/23 0722  AST 18 17  ALT 14 14  ALKPHOS 76 80  BILITOT 0.6 1.2  PROT 6.1* 6.3*  ALBUMIN 3.3* 3.3*   No results for input(s): "LIPASE", "AMYLASE" in the last 168 hours. No results for input(s): "AMMONIA" in the last 168 hours. CBC: Recent Labs  Lab 09/17/23 2325 09/18/23 0722 09/18/23 1801 09/19/23 0417 09/20/23 0423 09/21/23 0410 09/21/23 1036  WBC 6.2 6.0  --  7.3 7.3 7.0 7.4  NEUTROABS 3.8  --   --   --   --   --   --   HGB 6.6* 8.2* 8.4* 8.7* 8.9* 8.3* 8.3*  HCT 23.5* 27.7* 29.1* 29.8* 32.2* 29.2* 28.1*  MCV 78.1* 78.5*  --  80.3 82.4 80.9 81.2  PLT 322 322  --  357 327 312 308   Cardiac Enzymes: No results for input(s):  "CKTOTAL", "CKMB", "CKMBINDEX", "TROPONINI" in the last 168 hours. BNP: Invalid input(s): "POCBNP" CBG: Recent Labs  Lab 09/20/23  1724 09/20/23 2001 09/21/23 0301 09/21/23 0741 09/21/23 1115  GLUCAP 200* 186* 279* 205* 237*   D-Dimer No results for input(s): "DDIMER" in the last 72 hours. Hgb A1c No results for input(s): "HGBA1C" in the last 72 hours. Lipid Profile No results for input(s): "CHOL", "HDL", "LDLCALC", "TRIG", "CHOLHDL", "LDLDIRECT" in the last 72 hours. Thyroid function studies No results for input(s): "TSH", "T4TOTAL", "T3FREE", "THYROIDAB" in the last 72 hours.  Invalid input(s): "FREET3" Anemia work up No results for input(s): "VITAMINB12", "FOLATE", "FERRITIN", "TIBC", "IRON", "RETICCTPCT" in the last 72 hours. Urinalysis    Component Value Date/Time   COLORURINE YELLOW 09/18/2023 1050   APPEARANCEUR CLEAR 09/18/2023 1050   LABSPEC 1.014 09/18/2023 1050   PHURINE 6.0 09/18/2023 1050   GLUCOSEU 50 (A) 09/18/2023 1050   HGBUR NEGATIVE 09/18/2023 1050   BILIRUBINUR NEGATIVE 09/18/2023 1050   KETONESUR NEGATIVE 09/18/2023 1050   PROTEINUR 100 (A) 09/18/2023 1050   UROBILINOGEN 0.2 01/01/2015 0912   NITRITE NEGATIVE 09/18/2023 1050   LEUKOCYTESUR NEGATIVE 09/18/2023 1050   Sepsis Labs Recent Labs  Lab 09/19/23 0417 09/20/23 0423 09/21/23 0410 09/21/23 1036  WBC 7.3 7.3 7.0 7.4   Microbiology No results found for this or any previous visit (from the past 240 hour(s)).  Time coordinating discharge:   SIGNED:  Standley Dakins, MD  [facility] 09/21/2023, 4:09 PM How to contact the [facility] Attending or Consulting provider 7A - 7P or covering provider during after hours 7P -7A, for this patient?  Check the care team in [facility] and look for a) attending/consulting [facility] provider listed and b) the [facility] team listed Log into [email or web address] and use [facility] universal password to access. If you do not have the password, please contact the hospital  operator. Locate the [facility] provider you are looking for under [facility] and page to a number that you can be directly reached. If you still have difficulty reaching the provider, please page the [facility] (Director on Call) for the Hospitalists listed on amion for assistance.

## 2023-09-21 NOTE — Progress Notes (Signed)
Gastroenterology Progress Note   Referring Provider: No ref. provider found Primary Care Physician:  Carmel Sacramento, NP Primary Gastroenterologist:  Dr. Marletta Lor  Patient ID: Thomas Donovan; [id number]; [birth year]    Subjective   Patient reports feeling anxious/agitated and unable to calm down adequately.  He has been requesting Valium, his Ativan had recently been DC'd by hospitalist.  Patient denies any nausea, vomiting, or abdominal pain.  States he had some low-volume bright red blood in the toilet this morning as well as with bowel movement yesterday.  States stools have been normal brown and not black.  Objective   Vital signs in last 24 hours Temp:  [97.9 F (36.6 C)-98.4 F (36.9 C)] 98.4 F (36.9 C) (09/26 0304) Pulse Rate:  [82-102] 82 (09/26 0304) BP: (117-139)/(65-80) 139/76 (09/26 0304) SpO2:  [96 %-100 %] 98 % (09/26 0825) Weight:  [134.5 kg] 134.5 kg (09/26 0500) Last BM Date : 09/20/23  Physical Exam General:   Alert and oriented. Cooperative. NAD. Mildly anxious Head:  Normocephalic and atraumatic. Eyes:  No icterus, sclera clear. Conjuctiva pink.  Mouth:  Without lesions, mucosa pink and moist.  Neck:  Supple, without thyromegaly or masses.  Abdomen:  Bowel sounds present, soft, non-tender. Obese, UTA for HSM.  No rebound or guarding. Msk:  Symmetrical without gross deformities. Normal posture. Extremities:  Without clubbing or edema. Neurologic:  Alert and  oriented x4;  grossly normal neurologically. No asterixis.  Skin:  Warm and dry, intact without significant lesions.  Psych:  Alert and cooperative. Normal mood and affect.  Intake/Output from previous day: 09/25 0701 - 09/26 0700 In: 7244.1 [P.O.:720; I.V.:24.1; Blood:6500] Out: 4150 [Urine:4150] Intake/Output this shift: Total I/O In: 240 [P.O.:240] Out: 550 [Urine:550]  Lab Results  Recent Labs    09/20/23 0423 09/21/23 0410 09/21/23 1036  WBC 7.3 7.0 7.4  HGB 8.9* 8.3* 8.3*  HCT 32.2*  29.2* 28.1*  PLT 327 312 308   BMET Recent Labs    09/19/23 0417 09/20/23 0423 09/21/23 0410  NA 137 135 134*  K 3.9 3.9 3.5  CL 101 95* 97*  CO2 26 27 26   GLUCOSE 177* 186* 263*  BUN 16 17 21   CREATININE 1.41* 1.51* 1.64*  CALCIUM 9.1 9.0 8.4*   LFT No results for input(s): "PROT", "ALBUMIN", "AST", "ALT", "ALKPHOS", "BILITOT", "BILIDIR", "IBILI" in the last 72 hours. PT/INR Recent Labs    09/20/23 1519  LABPROT 14.3  INR 1.1   Hepatitis Panel No results for input(s): "HEPBSAG", "HCVAB", "HEPAIGM", "HEPBIGM" in the last 72 hours.  Studies/Results US LIVER DOPPLER  Result Date: 09/19/2023 CLINICAL DATA:  Cirrhosis. EXAM: DUPLEX ULTRASOUND OF LIVER TECHNIQUE: Color and duplex Doppler ultrasound was performed to evaluate the hepatic in-flow and out-flow vessels. COMPARISON:  Ultrasound of the abdomen performed separately today. FINDINGS: Liver: Morphology of the liver is consistent with hepatic cirrhosis. No focal lesion, mass or intrahepatic biliary ductal dilatation. Main Portal Vein size: 1.3 cm Portal Vein Velocities Main Prox:  67 cm/sec Main Mid: 47 cm/sec Main Dist:  36 cm/sec Right: 39 cm/sec Left: 27 cm/sec Hepatic Vein Velocities Right:  47 cm/sec Middle:  86 cm/sec Left:  116 cm/sec IVC: Present and patent with normal respiratory phasicity. Hepatic Artery Velocity:  50 cm/sec Splenic Vein Velocity:   cm/sec Spleen: 14.5 cm x 5.3 cm x 16.2 cm with a total volume of 651 cm^3 (411 cm^3 is upper limit normal) Portal Vein Occlusion/Thrombus: No Splenic Vein Occlusion/Thrombus: No Ascites: None Varices: None Normal  direction of portal vein flow towards the liver with normal waveforms. Hepatic vein waveforms are within normal limits. IMPRESSION: 1. Morphology of the liver consistent with hepatic cirrhosis. No focal liver lesion identified. 2. Splenomegaly. 3. Patent portal vein with normal direction of flow towards the liver. No evidence of portal vein thrombus. 4. Normal measured  portal vein velocities without significant depression to suggest significant portal hypertension by duplex ultrasound. Electronically Signed   By: Irish Lack M.D.   On: 09/19/2023 07:54   US Abdomen Complete  Result Date: 09/18/2023 CLINICAL DATA:  Right upper quadrant pain EXAM: ABDOMEN ULTRASOUND COMPLETE COMPARISON:  CT abdomen and pelvis 07/06/2023 FINDINGS: Gallbladder: Gallstones are visualized measuring up to 7 mm. There is no gallbladder wall thickening or pericholecystic fluid. No sonographic Murphy sign noted by sonographer. Common bile duct: Diameter: 5 mm Liver: Lobular contour. No focal lesion identified. There is increase in parenchymal echogenicity. Portal vein is patent on color Doppler imaging with normal direction of blood flow towards the liver. IVC: No abnormality visualized. Pancreas: Not visualized secondary to overlying bowel gas. Spleen: Enlarged measuring up to 14.5 cm in length. Right Kidney: Length: 12 cm. Echogenicity within normal limits. There is no hydronephrosis. There is a 1.5 x 1.7 x 1.3 cm cyst in the right kidney. Left Kidney: Length: 12.2 cm. Echogenicity within normal limits. There is no hydronephrosis. There is a 3.2 x 4.6 x 2.9 cm cyst in the left kidney. Abdominal aorta: The proximal abdominal aorta is visualized measuring up to 3.2 cm. Distal abdominal aorta not well seen. Other findings: None. IMPRESSION: 1. Cholelithiasis without sonographic evidence of acute cholecystitis. 2. Cirrhotic liver morphology. 3. Splenomegaly. 4. Bilateral renal cysts. 5. 3.2 cm abdominal aortic aneurysm. Recommend follow-up every 3 years. Electronically Signed   By: Darliss Cheney M.D.   On: 09/18/2023 17:07   ECHOCARDIOGRAM COMPLETE  Result Date: 09/18/2023    ECHOCARDIOGRAM REPORT   Patient Name:   Thomas Donovan Date of Exam: 09/18/2023 Medical Rec #:  [id number]        Height:       73.0 in Accession #:    [id number]       Weight:       296.9 lb Date of Birth:  Thomas Donovan          BSA:          2.545 m Patient Age:    62 years         BP:           126/84 mmHg Patient Gender: M                HR:           84 bpm. [facility] Procedure: 2D Echo, Cardiac Doppler and Color Doppler Indications:    Chest Pain R07.9  History:        Patient has prior history of Echocardiogram examinations, most                 recent 08/22/2017. CAD, Arrythmias:Atrial Fibrillation,                 Signs/Symptoms:Syncope; Risk Factors:Diabetes, Tobacco use and                 Hypertension. Alcohol use disorder, Cocaine abuse (HCC).  Sonographer:    Celesta Gentile RCS Referring Phys: [id number] [facility]  Sonographer Comments: Technically difficult study due to poor echo windows. Image acquisition challenging due to  direction of portal vein flow towards the liver with normal waveforms. Hepatic vein waveforms are within normal limits. IMPRESSION: 1. Morphology of the liver consistent with hepatic cirrhosis. No focal liver lesion identified. 2. Splenomegaly. 3. Patent portal vein with normal direction of flow towards the liver. No evidence of portal vein thrombus. 4. Normal measured  portal vein velocities without significant depression to suggest significant portal hypertension by duplex ultrasound. Electronically Signed   By: Irish Lack M.D.   On: 09/19/2023 07:54   US Abdomen Complete  Result Date: 09/18/2023 CLINICAL DATA:  Right upper quadrant pain EXAM: ABDOMEN ULTRASOUND COMPLETE COMPARISON:  CT abdomen and pelvis 07/06/2023 FINDINGS: Gallbladder: Gallstones are visualized measuring up to 7 mm. There is no gallbladder wall thickening or pericholecystic fluid. No sonographic Murphy sign noted by sonographer. Common bile duct: Diameter: 5 mm Liver: Lobular contour. No focal lesion identified. There is increase in parenchymal echogenicity. Portal vein is patent on color Doppler imaging with normal direction of blood flow towards the liver. IVC: No abnormality visualized. Pancreas: Not visualized secondary to overlying bowel gas. Spleen: Enlarged measuring up to 14.5 cm in length. Right Kidney: Length: 12 cm. Echogenicity within normal limits. There is no hydronephrosis. There is a 1.5 x 1.7 x 1.3 cm cyst in the right kidney. Left Kidney: Length: 12.2 cm. Echogenicity within normal limits. There is no hydronephrosis. There is a 3.2 x 4.6 x 2.9 cm cyst in the left kidney. Abdominal aorta: The proximal abdominal aorta is visualized measuring up to 3.2 cm. Distal abdominal aorta not well seen. Other findings: None. IMPRESSION: 1. Cholelithiasis without sonographic evidence of acute cholecystitis. 2. Cirrhotic liver morphology. 3. Splenomegaly. 4. Bilateral renal cysts. 5. 3.2 cm abdominal aortic aneurysm. Recommend follow-up every 3 years. Electronically Signed   By: Darliss Cheney M.D.   On: 09/18/2023 17:07   ECHOCARDIOGRAM COMPLETE  Result Date: 09/18/2023    ECHOCARDIOGRAM REPORT   Patient Name:   Thomas Donovan Date of Exam: 09/18/2023 Medical Rec #:  [id number]        Height:       73.0 in Accession #:    [id number]       Weight:       296.9 lb Date of Birth:  Thomas Donovan          BSA:          2.545 m Patient Age:    62 years         BP:           126/84 mmHg Patient Gender: M                HR:           84 bpm. [facility] Procedure: 2D Echo, Cardiac Doppler and Color Doppler Indications:    Chest Pain R07.9  History:        Patient has prior history of Echocardiogram examinations, most                 recent 08/22/2017. CAD, Arrythmias:Atrial Fibrillation,                 Signs/Symptoms:Syncope; Risk Factors:Diabetes, Tobacco use and                 Hypertension. Alcohol use disorder, Cocaine abuse (HCC).  Sonographer:    Celesta Gentile RCS Referring Phys: [id number] [facility]  Sonographer Comments: Technically difficult study due to poor echo windows. Image acquisition challenging due to  direction of portal vein flow towards the liver with normal waveforms. Hepatic vein waveforms are within normal limits. IMPRESSION: 1. Morphology of the liver consistent with hepatic cirrhosis. No focal liver lesion identified. 2. Splenomegaly. 3. Patent portal vein with normal direction of flow towards the liver. No evidence of portal vein thrombus. 4. Normal measured  portal vein velocities without significant depression to suggest significant portal hypertension by duplex ultrasound. Electronically Signed   By: Irish Lack M.D.   On: 09/19/2023 07:54   US Abdomen Complete  Result Date: 09/18/2023 CLINICAL DATA:  Right upper quadrant pain EXAM: ABDOMEN ULTRASOUND COMPLETE COMPARISON:  CT abdomen and pelvis 07/06/2023 FINDINGS: Gallbladder: Gallstones are visualized measuring up to 7 mm. There is no gallbladder wall thickening or pericholecystic fluid. No sonographic Murphy sign noted by sonographer. Common bile duct: Diameter: 5 mm Liver: Lobular contour. No focal lesion identified. There is increase in parenchymal echogenicity. Portal vein is patent on color Doppler imaging with normal direction of blood flow towards the liver. IVC: No abnormality visualized. Pancreas: Not visualized secondary to overlying bowel gas. Spleen: Enlarged measuring up to 14.5 cm in length. Right Kidney: Length: 12 cm. Echogenicity within normal limits. There is no hydronephrosis. There is a 1.5 x 1.7 x 1.3 cm cyst in the right kidney. Left Kidney: Length: 12.2 cm. Echogenicity within normal limits. There is no hydronephrosis. There is a 3.2 x 4.6 x 2.9 cm cyst in the left kidney. Abdominal aorta: The proximal abdominal aorta is visualized measuring up to 3.2 cm. Distal abdominal aorta not well seen. Other findings: None. IMPRESSION: 1. Cholelithiasis without sonographic evidence of acute cholecystitis. 2. Cirrhotic liver morphology. 3. Splenomegaly. 4. Bilateral renal cysts. 5. 3.2 cm abdominal aortic aneurysm. Recommend follow-up every 3 years. Electronically Signed   By: Darliss Cheney M.D.   On: 09/18/2023 17:07   ECHOCARDIOGRAM COMPLETE  Result Date: 09/18/2023    ECHOCARDIOGRAM REPORT   Patient Name:   Thomas Donovan Date of Exam: 09/18/2023 Medical Rec #:  [id number]        Height:       73.0 in Accession #:    [id number]       Weight:       296.9 lb Date of Birth:  Thomas Donovan          BSA:          2.545 m Patient Age:    62 years         BP:           126/84 mmHg Patient Gender: M                HR:           84 bpm. [facility] Procedure: 2D Echo, Cardiac Doppler and Color Doppler Indications:    Chest Pain R07.9  History:        Patient has prior history of Echocardiogram examinations, most                 recent 08/22/2017. CAD, Arrythmias:Atrial Fibrillation,                 Signs/Symptoms:Syncope; Risk Factors:Diabetes, Tobacco use and                 Hypertension. Alcohol use disorder, Cocaine abuse (HCC).  Sonographer:    Celesta Gentile RCS Referring Phys: [id number] [facility]  Sonographer Comments: Technically difficult study due to poor echo windows. Image acquisition challenging due to  direction of portal vein flow towards the liver with normal waveforms. Hepatic vein waveforms are within normal limits. IMPRESSION: 1. Morphology of the liver consistent with hepatic cirrhosis. No focal liver lesion identified. 2. Splenomegaly. 3. Patent portal vein with normal direction of flow towards the liver. No evidence of portal vein thrombus. 4. Normal measured  portal vein velocities without significant depression to suggest significant portal hypertension by duplex ultrasound. Electronically Signed   By: Irish Lack M.D.   On: 09/19/2023 07:54   US Abdomen Complete  Result Date: 09/18/2023 CLINICAL DATA:  Right upper quadrant pain EXAM: ABDOMEN ULTRASOUND COMPLETE COMPARISON:  CT abdomen and pelvis 07/06/2023 FINDINGS: Gallbladder: Gallstones are visualized measuring up to 7 mm. There is no gallbladder wall thickening or pericholecystic fluid. No sonographic Murphy sign noted by sonographer. Common bile duct: Diameter: 5 mm Liver: Lobular contour. No focal lesion identified. There is increase in parenchymal echogenicity. Portal vein is patent on color Doppler imaging with normal direction of blood flow towards the liver. IVC: No abnormality visualized. Pancreas: Not visualized secondary to overlying bowel gas. Spleen: Enlarged measuring up to 14.5 cm in length. Right Kidney: Length: 12 cm. Echogenicity within normal limits. There is no hydronephrosis. There is a 1.5 x 1.7 x 1.3 cm cyst in the right kidney. Left Kidney: Length: 12.2 cm. Echogenicity within normal limits. There is no hydronephrosis. There is a 3.2 x 4.6 x 2.9 cm cyst in the left kidney. Abdominal aorta: The proximal abdominal aorta is visualized measuring up to 3.2 cm. Distal abdominal aorta not well seen. Other findings: None. IMPRESSION: 1. Cholelithiasis without sonographic evidence of acute cholecystitis. 2. Cirrhotic liver morphology. 3. Splenomegaly. 4. Bilateral renal cysts. 5. 3.2 cm abdominal aortic aneurysm. Recommend follow-up every 3 years. Electronically Signed   By: Darliss Cheney M.D.   On: 09/18/2023 17:07   ECHOCARDIOGRAM COMPLETE  Result Date: 09/18/2023    ECHOCARDIOGRAM REPORT   Patient Name:   Thomas Donovan Date of Exam: 09/18/2023 Medical Rec #:  [id number]        Height:       73.0 in Accession #:    [id number]       Weight:       296.9 lb Date of Birth:  Thomas Donovan          BSA:          2.545 m Patient Age:    62 years         BP:           126/84 mmHg Patient Gender: M                HR:           84 bpm. [facility] Procedure: 2D Echo, Cardiac Doppler and Color Doppler Indications:    Chest Pain R07.9  History:        Patient has prior history of Echocardiogram examinations, most                 recent 08/22/2017. CAD, Arrythmias:Atrial Fibrillation,                 Signs/Symptoms:Syncope; Risk Factors:Diabetes, Tobacco use and                 Hypertension. Alcohol use disorder, Cocaine abuse (HCC).  Sonographer:    Celesta Gentile RCS Referring Phys: [id number] [facility]  Sonographer Comments: Technically difficult study due to poor echo windows. Image acquisition challenging due to

## 2023-09-21 NOTE — Care Management Important Message (Signed)
Important Message  Patient Details  Name: Thomas Donovan MRN: [id number] Date of Birth: [birth year]   Important Message Given:  Yes - Medicare IM     Corey Harold 09/21/2023, 11:53 AM

## 2023-09-21 NOTE — Progress Notes (Signed)
Patient extremely agitated this shift, given PRN ativan per CIWA protocol. Patient speaking with wife yelling at her , and she seems to escalate his inability to calm down. Dr.Johnson initiated Librium PO and patient is asleep at this time.  Will continue to monitor per CIWA protocol .      09/21/23 1000  CIWA-Ar  Nausea and Vomiting 0  Tactile Disturbances 2  Tremor 2  Auditory Disturbances 1  Paroxysmal Sweats 1  Visual Disturbances 0  Anxiety 3  Headache, Fullness in Head 0  Agitation 4  Orientation and Clouding of Sensorium 0  CIWA-Ar Total 13

## 2023-09-21 NOTE — Progress Notes (Addendum)
Rounding Note    Patient Name: Thomas Donovan Date of Encounter: 09/21/2023  [facility] Cardiologist: New  Subjective   SOB is improving.   Inpatient Medications    Scheduled Meds:  acetaminophen  650 mg Oral TID   cyanocobalamin  1,000 mcg Intramuscular Daily   Followed by   Melene Muller ON 09/25/2023] vitamin B-12  1,000 mcg Oral Daily   diltiazem  180 mg Oral Daily   feeding supplement (GLUCERNA SHAKE)  237 mL Oral TID BM   fluticasone furoate-vilanterol  1 puff Inhalation Daily   And   umeclidinium bromide  1 puff Inhalation Daily   folic acid  1 mg Oral Daily   gabapentin  600 mg Oral TID   insulin aspart  0-9 Units Subcutaneous TID WC   insulin aspart  5 Units Subcutaneous TID WC   isosorbide mononitrate  30 mg Oral Daily   magnesium oxide  400 mg Oral BID   multivitamin with minerals  1 tablet Oral Daily   pantoprazole (PROTONIX) IV  40 mg Intravenous Q12H   tamsulosin  0.4 mg Oral Daily   thiamine  100 mg Oral Daily   Or   thiamine  100 mg Intravenous Daily   Continuous Infusions:  furosemide (LASIX) 200 mg in dextrose 5 % 100 mL (2 mg/mL) infusion 6 mg/hr (09/20/23 1123)   PRN Meds: albuterol, colchicine, diclofenac Sodium, diphenhydrAMINE-zinc acetate, guaiFENesin-dextromethorphan, HYDROmorphone (DILAUDID) injection, HYDROmorphone, LORazepam **OR** LORazepam, ondansetron **OR** ondansetron (ZOFRAN) IV   Vital Signs    Vitals:   09/20/23 2004 09/21/23 0304 09/21/23 0500 09/21/23 0825  BP: 135/80 139/76    Pulse: 92 82    Resp:      Temp: 98.2 F (36.8 C) 98.4 F (36.9 C)    TempSrc: Oral Oral    SpO2: 98% 99%  98%  Weight:   134.5 kg   Height:        Intake/Output Summary (Last 24 hours) at 09/21/2023 0837 Last data filed at 09/21/2023 0818 Gross per 24 hour  Intake 7484.06 ml  Output 4150 ml  Net 3334.06 ml      09/21/2023    5:00 AM 09/20/2023    3:41 AM 09/19/2023    3:39 AM  Last 3 Weights  Weight (lbs) 296 lb 8.3 oz 296 lb 8.3  oz 295 lb 3.1 oz  Weight (kg) 134.5 kg 134.5 kg 133.9 kg      Telemetry    Afib 80s to 100s - Personally Reviewed  ECG    N/a - Personally Reviewed  Physical Exam   GEN: No acute distress.   Neck: No JVD Cardiac: irreg Respiratory: Clear to auscultation bilaterally. GI: Soft, nontender, non-distended  MS: trace bilateral edema Neuro:  Nonfocal  Psych: Normal affect   Labs    High Sensitivity Troponin:   Recent Labs  Lab 09/17/23 2325 09/18/23 0027 09/18/23 0942 09/18/23 1158  TROPONINIHS 8 9 8 9      Chemistry Recent Labs  Lab 09/17/23 2325 09/17/23 2325 09/18/23 0722 09/19/23 0417 09/20/23 0423 09/21/23 0410  NA 133*  --  136 137 135 134*  K 3.8  --  4.2 3.9 3.9 3.5  CL 99  --  102 101 95* 97*  CO2 21*  --  23 26 27 26   GLUCOSE 173*  --  212* 177* 186* 263*  BUN 17  --  18 16 17 21   CREATININE 1.65*  --  1.63* 1.41* 1.51* 1.64*  CALCIUM 8.5*  --  8.6* 9.1 9.0 8.4*  MG  --    < > 1.8 1.6* 1.7 1.7  PROT 6.1*  --  6.3*  --   --   --   ALBUMIN 3.3*  --  3.3*  --   --   --   AST 18  --  17  --   --   --   ALT 14  --  14  --   --   --   ALKPHOS 76  --  80  --   --   --   BILITOT 0.6  --  1.2  --   --   --   GFRNONAA 47*  --  48* 57* 52* 47*  ANIONGAP 13  --  11 10 13 11    < > = values in this interval not displayed.    Lipids No results for input(s): "CHOL", "TRIG", "HDL", "LABVLDL", "LDLCALC", "CHOLHDL" in the last 168 hours.  Hematology Recent Labs  Lab 09/19/23 0417 09/20/23 0423 09/21/23 0410  WBC 7.3 7.3 7.0  RBC 3.71* 3.91* 3.61*  HGB 8.7* 8.9* 8.3*  HCT 29.8* 32.2* 29.2*  MCV 80.3 82.4 80.9  MCH 23.5* 22.8* 23.0*  MCHC 29.2* 27.6* 28.4*  RDW 19.6* 20.3* 20.8*  PLT 357 327 312   Thyroid  Recent Labs  Lab 09/18/23 0942 09/18/23 1359  TSH 4.973*  --   FREET4  --  0.94    BNP Recent Labs  Lab 09/18/23 0942  BNP 158.0*    DDimer No results for input(s): "DDIMER" in the last 168 hours.   Radiology    No results  found.  Cardiac Studies    Patient Profile     62 y.o. male tobacco use, diabetes type 2, CKD stage III, OSA noncompliant with CPAP, cocaine use, paroxysmal A-fib, hypertension, dyslipidemia, obesity, COPD, noncompliance, leaving AMA who is being seen 09/18/2023 for the evaluation of atrial fibrillation with RVR   Assessment & Plan    1.Chest pain - long history of chest pain - Hgb 6.6 on presentation, cocaine + -2018 nuclear stress without ischemia. Discussions at that time to consider cath but patient declined. Never followed up with cards as outpatient  - 2022 nuclear stress [facility]: No ischemia - current symptoms are not cardiac, fullness midchest/epigastric lasting hours a time.    - in general not a cath candidate given recurrent transfusion dependent anemia. Very clear alternative etiologies for chest pain with severe anemia, cocaine +. Nuclear stress testing has been negative twice within the last 6 years. Recent chest pains noncardiac in description - echo this admit LVE 60-65%, no WMAs, indet diastolic fxn       2.Anemia - Hgb 6.6 on presentation - from [facility] notes multiple prior admits with anemia and multiple transfusions - reports hematuria, intermittent bright red blood per rectum as well - ferritin 18. Hemoccult + in ER.  - seen by GI, per note not candidate for procedures at this time due to positive drug screen, reconsider as outpatient.      3. Afib - prior history of afib per [facility] notes, has been on eliquis - admitted with symptomatic anemia Hgb 6.6, not an anticoag candidate at this time.  - would stay off eliquis given recurrent anemia - if establishes with consitent f/u could possibly consider watchman evaluation as outpatient.    -transitioned to long acting oral diltiazem 180mg  this AM. Rates are contorlled     4.CKD - monitor renal function with diuresis  5. Acute on chronic HFpEF - 03/2023 echo [facility]: LVE >55%, no significant valve pathology - BNP 158, IVC  fixed and dilated. Reds vest 38% previously - primary team started lasix gtt, we increased rate to 6mg /hr  -I/Os are inaccurate/incomplete from yesterday. Weight stable from yesterday, slight uptrend in Cr but within prior range.   -he was on oral lasix 40mg  daily at home, he reports compliance. Would change to oral torsemide 40mg  daily at discharge. Suspect can change to oral diuretic tomorrow, continue IV diuresis one more day.        6. AAA  3.2 cm AAA noted on abd [place] needs repeat study 3 years.   For questions or updates, please contact [facility] Please consult [email or web address] for contact info under        Signed, Dina Rich, MD  09/21/2023, 8:37 AM

## 2023-10-05 ENCOUNTER — Encounter: Payer: Self-pay | Admitting: Gastroenterology

## 2023-10-05 ENCOUNTER — Inpatient Hospital Stay: Payer: Medicare HMO | Admitting: Gastroenterology

## 2023-10-09 ENCOUNTER — Ambulatory Visit: Payer: Medicare HMO | Admitting: Urology

## 2023-10-26 ENCOUNTER — Inpatient Hospital Stay (HOSPITAL_COMMUNITY)
Admission: EM | Admit: 2023-10-26 | Discharge: 2023-10-28 | DRG: 309 | Disposition: A | Discharge status: Home / Self Care | Payer: Medicare HMO | Source: Ambulatory Visit | Attending: Internal Medicine | Admitting: Internal Medicine

## 2023-10-26 ENCOUNTER — Other Ambulatory Visit: Payer: Self-pay

## 2023-10-26 DIAGNOSIS — D75839 Thrombocytosis, unspecified: Secondary | ICD-10-CM | POA: Diagnosis not present

## 2023-10-26 DIAGNOSIS — I480 Paroxysmal atrial fibrillation: Principal | ICD-10-CM | POA: Diagnosis present

## 2023-10-26 DIAGNOSIS — Z91148 Patient's other noncompliance with medication regimen for other reason: Secondary | ICD-10-CM

## 2023-10-26 DIAGNOSIS — M51369 Other intervertebral disc degeneration, lumbar region without mention of lumbar back pain or lower extremity pain: Secondary | ICD-10-CM | POA: Diagnosis present

## 2023-10-26 DIAGNOSIS — E669 Obesity, unspecified: Secondary | ICD-10-CM | POA: Insufficient documentation

## 2023-10-26 DIAGNOSIS — I959 Hypotension, unspecified: Secondary | ICD-10-CM | POA: Diagnosis present

## 2023-10-26 DIAGNOSIS — Z7901 Long term (current) use of anticoagulants: Secondary | ICD-10-CM

## 2023-10-26 DIAGNOSIS — N184 Chronic kidney disease, stage 4 (severe): Secondary | ICD-10-CM | POA: Diagnosis present

## 2023-10-26 DIAGNOSIS — E66812 Obesity, class 2: Secondary | ICD-10-CM | POA: Diagnosis present

## 2023-10-26 DIAGNOSIS — G4733 Obstructive sleep apnea (adult) (pediatric): Secondary | ICD-10-CM

## 2023-10-26 DIAGNOSIS — Z7951 Long term (current) use of inhaled steroids: Secondary | ICD-10-CM

## 2023-10-26 DIAGNOSIS — E46 Unspecified protein-calorie malnutrition: Secondary | ICD-10-CM

## 2023-10-26 DIAGNOSIS — Z7952 Long term (current) use of systemic steroids: Secondary | ICD-10-CM

## 2023-10-26 DIAGNOSIS — N179 Acute kidney failure, unspecified: Secondary | ICD-10-CM | POA: Diagnosis not present

## 2023-10-26 DIAGNOSIS — D649 Anemia, unspecified: Secondary | ICD-10-CM | POA: Diagnosis present

## 2023-10-26 DIAGNOSIS — F1910 Other psychoactive substance abuse, uncomplicated: Secondary | ICD-10-CM | POA: Insufficient documentation

## 2023-10-26 DIAGNOSIS — M109 Gout, unspecified: Secondary | ICD-10-CM | POA: Insufficient documentation

## 2023-10-26 DIAGNOSIS — E1122 Type 2 diabetes mellitus with diabetic chronic kidney disease: Secondary | ICD-10-CM | POA: Diagnosis present

## 2023-10-26 DIAGNOSIS — R000 Tachycardia, unspecified: Secondary | ICD-10-CM | POA: Diagnosis present

## 2023-10-26 DIAGNOSIS — Z91199 Patient's noncompliance with other medical treatment and regimen due to unspecified reason: Secondary | ICD-10-CM

## 2023-10-26 DIAGNOSIS — Z886 Allergy status to analgesic agent status: Secondary | ICD-10-CM

## 2023-10-26 DIAGNOSIS — Z79899 Other long term (current) drug therapy: Secondary | ICD-10-CM

## 2023-10-26 DIAGNOSIS — M79605 Pain in left leg: Secondary | ICD-10-CM | POA: Diagnosis present

## 2023-10-26 DIAGNOSIS — Z23 Encounter for immunization: Secondary | ICD-10-CM

## 2023-10-26 DIAGNOSIS — Z885 Allergy status to narcotic agent status: Secondary | ICD-10-CM

## 2023-10-26 DIAGNOSIS — I5032 Chronic diastolic (congestive) heart failure: Secondary | ICD-10-CM | POA: Insufficient documentation

## 2023-10-26 DIAGNOSIS — Z7984 Long term (current) use of oral hypoglycemic drugs: Secondary | ICD-10-CM

## 2023-10-26 DIAGNOSIS — N400 Enlarged prostate without lower urinary tract symptoms: Secondary | ICD-10-CM | POA: Diagnosis present

## 2023-10-26 DIAGNOSIS — E441 Mild protein-calorie malnutrition: Secondary | ICD-10-CM | POA: Diagnosis present

## 2023-10-26 DIAGNOSIS — N189 Chronic kidney disease, unspecified: Secondary | ICD-10-CM | POA: Insufficient documentation

## 2023-10-26 DIAGNOSIS — K219 Gastro-esophageal reflux disease without esophagitis: Secondary | ICD-10-CM | POA: Diagnosis present

## 2023-10-26 DIAGNOSIS — F1410 Cocaine abuse, uncomplicated: Secondary | ICD-10-CM | POA: Diagnosis present

## 2023-10-26 DIAGNOSIS — Z981 Arthrodesis status: Secondary | ICD-10-CM

## 2023-10-26 DIAGNOSIS — Z888 Allergy status to other drugs, medicaments and biological substances status: Secondary | ICD-10-CM

## 2023-10-26 DIAGNOSIS — Z87442 Personal history of urinary calculi: Secondary | ICD-10-CM

## 2023-10-26 DIAGNOSIS — F17210 Nicotine dependence, cigarettes, uncomplicated: Secondary | ICD-10-CM | POA: Diagnosis present

## 2023-10-26 DIAGNOSIS — M1000 Idiopathic gout, unspecified site: Secondary | ICD-10-CM

## 2023-10-26 DIAGNOSIS — R7989 Other specified abnormal findings of blood chemistry: Secondary | ICD-10-CM | POA: Insufficient documentation

## 2023-10-26 DIAGNOSIS — E8809 Other disorders of plasma-protein metabolism, not elsewhere classified: Secondary | ICD-10-CM | POA: Insufficient documentation

## 2023-10-26 DIAGNOSIS — I130 Hypertensive heart and chronic kidney disease with heart failure and stage 1 through stage 4 chronic kidney disease, or unspecified chronic kidney disease: Secondary | ICD-10-CM | POA: Diagnosis present

## 2023-10-26 DIAGNOSIS — I2510 Atherosclerotic heart disease of native coronary artery without angina pectoris: Secondary | ICD-10-CM | POA: Diagnosis present

## 2023-10-26 DIAGNOSIS — G8929 Other chronic pain: Secondary | ICD-10-CM | POA: Insufficient documentation

## 2023-10-26 DIAGNOSIS — M79604 Pain in right leg: Secondary | ICD-10-CM | POA: Diagnosis present

## 2023-10-26 DIAGNOSIS — E1165 Type 2 diabetes mellitus with hyperglycemia: Secondary | ICD-10-CM | POA: Diagnosis present

## 2023-10-26 DIAGNOSIS — Z6839 Body mass index (BMI) 39.0-39.9, adult: Secondary | ICD-10-CM

## 2023-10-26 DIAGNOSIS — F1010 Alcohol abuse, uncomplicated: Secondary | ICD-10-CM | POA: Diagnosis present

## 2023-10-26 LAB — CBC WITH DIFFERENTIAL/PLATELET
Abs Immature Granulocytes: 0.02 10*3/uL (ref 0.00–0.07)
Basophils Absolute: 0 10*3/uL (ref 0.0–0.1)
Basophils Relative: 1 %
Eosinophils Absolute: 0.3 10*3/uL (ref 0.0–0.5)
Eosinophils Relative: 4 %
HCT: 31.4 % — ABNORMAL LOW (ref 39.0–52.0)
Hemoglobin: 9.7 g/dL — ABNORMAL LOW (ref 13.0–17.0)
Immature Granulocytes: 0 %
Lymphocytes Relative: 16 %
Lymphs Abs: 1.1 10*3/uL (ref 0.7–4.0)
MCH: 25.1 pg — ABNORMAL LOW (ref 26.0–34.0)
MCHC: 30.9 g/dL (ref 30.0–36.0)
MCV: 81.3 fL (ref 80.0–100.0)
Monocytes Absolute: 0.6 10*3/uL (ref 0.1–1.0)
Monocytes Relative: 8 %
Neutro Abs: 5.2 10*3/uL (ref 1.7–7.7)
Neutrophils Relative %: 71 %
Platelets: 424 10*3/uL — ABNORMAL HIGH (ref 150–400)
RBC: 3.86 MIL/uL — ABNORMAL LOW (ref 4.22–5.81)
RDW: 18.6 % — ABNORMAL HIGH (ref 11.5–15.5)
WBC: 7.2 10*3/uL (ref 4.0–10.5)
nRBC: 0 % (ref 0.0–0.2)

## 2023-10-26 LAB — COMPREHENSIVE METABOLIC PANEL
ALT: 25 U/L (ref 0–44)
AST: 23 U/L (ref 15–41)
Albumin: 3.3 g/dL — ABNORMAL LOW (ref 3.5–5.0)
Alkaline Phosphatase: 110 U/L (ref 38–126)
Anion gap: 15 (ref 5–15)
BUN: 42 mg/dL — ABNORMAL HIGH (ref 8–23)
CO2: 24 mmol/L (ref 22–32)
Calcium: 9.1 mg/dL (ref 8.9–10.3)
Chloride: 97 mmol/L — ABNORMAL LOW (ref 98–111)
Creatinine, Ser: 2.91 mg/dL — ABNORMAL HIGH (ref 0.61–1.24)
GFR, Estimated: 24 mL/min — ABNORMAL LOW (ref >60)
Glucose, Bld: 185 mg/dL — ABNORMAL HIGH (ref 70–99)
Potassium: 3.7 mmol/L (ref 3.5–5.1)
Sodium: 136 mmol/L (ref 135–145)
Total Bilirubin: 0.4 mg/dL (ref 0.3–1.2)
Total Protein: 7.4 g/dL (ref 6.5–8.1)

## 2023-10-26 LAB — CBG MONITORING, ED: Glucose-Capillary: 298 mg/dL — ABNORMAL HIGH (ref 70–99)

## 2023-10-26 LAB — TROPONIN I (HIGH SENSITIVITY)
Troponin I (High Sensitivity): 7 ng/L (ref <18)
Troponin I (High Sensitivity): 7 ng/L (ref <18)

## 2023-10-26 LAB — RAPID URINE DRUG SCREEN, HOSP PERFORMED
Amphetamines: NOT DETECTED
Barbiturates: NOT DETECTED
Benzodiazepines: POSITIVE — AB
Cocaine: NOT DETECTED
Opiates: POSITIVE — AB
Tetrahydrocannabinol: NOT DETECTED

## 2023-10-26 LAB — URIC ACID: Uric Acid, Serum: 9.7 mg/dL — ABNORMAL HIGH (ref 3.7–8.6)

## 2023-10-26 LAB — D-DIMER, QUANTITATIVE: D-Dimer, Quant: 1.33 ug{FEU}/mL — ABNORMAL HIGH (ref 0.00–0.50)

## 2023-10-26 MED ORDER — FINASTERIDE 5 MG PO TABS
5.0000 mg | ORAL_TABLET | Freq: Every day | ORAL | Status: DC
  Administered 2023-10-27 – 2023-10-28 (×2): 5 mg via ORAL
  Filled 2023-10-26 (×2): qty 1

## 2023-10-26 MED ORDER — ATORVASTATIN CALCIUM 40 MG PO TABS
40.0000 mg | ORAL_TABLET | Freq: Every day | ORAL | Status: DC
  Administered 2023-10-27 – 2023-10-28 (×2): 40 mg via ORAL
  Filled 2023-10-26 (×2): qty 1

## 2023-10-26 MED ORDER — PANTOPRAZOLE SODIUM 40 MG PO TBEC
40.0000 mg | DELAYED_RELEASE_TABLET | Freq: Every day | ORAL | Status: DC
  Administered 2023-10-26 – 2023-10-28 (×3): 40 mg via ORAL
  Filled 2023-10-26 (×3): qty 1

## 2023-10-26 MED ORDER — APIXABAN 5 MG PO TABS
5.0000 mg | ORAL_TABLET | Freq: Two times a day (BID) | ORAL | Status: DC
  Filled 2023-10-26: qty 1

## 2023-10-26 MED ORDER — MORPHINE SULFATE (PF) 4 MG/ML IV SOLN
4.0000 mg | Freq: Once | INTRAVENOUS | Status: AC
  Administered 2023-10-26: 4 mg via INTRAVENOUS
  Filled 2023-10-26: qty 1

## 2023-10-26 MED ORDER — COLCHICINE 0.6 MG PO TABS
0.6000 mg | ORAL_TABLET | Freq: Once | ORAL | Status: AC
  Administered 2023-10-26: 0.6 mg via ORAL
  Filled 2023-10-26: qty 1

## 2023-10-26 MED ORDER — CHLORHEXIDINE GLUCONATE CLOTH 2 % EX PADS
6.0000 | MEDICATED_PAD | Freq: Every day | CUTANEOUS | Status: DC
  Administered 2023-10-26 – 2023-10-27 (×2): 6 via TOPICAL

## 2023-10-26 MED ORDER — ALLOPURINOL 100 MG PO TABS
200.0000 mg | ORAL_TABLET | Freq: Every day | ORAL | Status: DC
  Filled 2023-10-26 (×2): qty 2

## 2023-10-26 MED ORDER — INSULIN ASPART 100 UNIT/ML IJ SOLN: 0.0000 [IU] | Freq: Three times a day (TID) | INTRAMUSCULAR | Status: DC

## 2023-10-26 MED ORDER — LACTATED RINGERS IV SOLN: INTRAVENOUS | Status: DC

## 2023-10-26 MED ORDER — GLUCERNA SHAKE PO LIQD
237.0000 mL | Freq: Three times a day (TID) | ORAL | Status: DC
  Administered 2023-10-26 – 2023-10-28 (×5): 237 mL via ORAL
  Filled 2023-10-26 (×3): qty 237

## 2023-10-26 MED ORDER — OXYCODONE HCL 5 MG PO TABS
5.0000 mg | ORAL_TABLET | ORAL | Status: DC | PRN
  Administered 2023-10-26 – 2023-10-28 (×7): 5 mg via ORAL
  Filled 2023-10-26 (×8): qty 1

## 2023-10-26 MED ORDER — PREDNISONE 50 MG PO TABS
50.0000 mg | ORAL_TABLET | Freq: Once | ORAL | Status: AC
  Administered 2023-10-26: 50 mg via ORAL
  Filled 2023-10-26: qty 1

## 2023-10-26 MED ORDER — TAMSULOSIN HCL 0.4 MG PO CAPS
0.4000 mg | ORAL_CAPSULE | Freq: Every day | ORAL | Status: DC
  Administered 2023-10-27 – 2023-10-28 (×2): 0.4 mg via ORAL
  Filled 2023-10-26 (×2): qty 1

## 2023-10-26 MED ORDER — DILTIAZEM HCL-DEXTROSE 125-5 MG/125ML-% IV SOLN (PREMIX)
5.0000 mg/h | INTRAVENOUS | Status: DC
  Administered 2023-10-26: 5 mg/h via INTRAVENOUS
  Filled 2023-10-26: qty 125

## 2023-10-26 MED ORDER — DILTIAZEM HCL 30 MG PO TABS
180.0000 mg | ORAL_TABLET | Freq: Once | ORAL | Status: AC
  Administered 2023-10-26: 180 mg via ORAL
  Filled 2023-10-26: qty 6

## 2023-10-26 MED ORDER — FENOFIBRATE 160 MG PO TABS
160.0000 mg | ORAL_TABLET | Freq: Every day | ORAL | Status: DC
  Administered 2023-10-27 – 2023-10-28 (×2): 160 mg via ORAL
  Filled 2023-10-26 (×2): qty 1

## 2023-10-26 MED ORDER — ONDANSETRON HCL 4 MG/2ML IJ SOLN: 4.0000 mg | Freq: Four times a day (QID) | INTRAMUSCULAR | Status: DC | PRN

## 2023-10-26 MED ORDER — ONDANSETRON HCL 4 MG PO TABS: 4.0000 mg | ORAL_TABLET | Freq: Four times a day (QID) | ORAL | Status: DC | PRN

## 2023-10-26 MED ORDER — INSULIN ASPART 100 UNIT/ML IJ SOLN
0.0000 [IU] | Freq: Three times a day (TID) | INTRAMUSCULAR | Status: DC
  Administered 2023-10-27: 7 [IU] via SUBCUTANEOUS
  Administered 2023-10-27: 3 [IU] via SUBCUTANEOUS
  Administered 2023-10-27: 7 [IU] via SUBCUTANEOUS
  Administered 2023-10-27: 9 [IU] via SUBCUTANEOUS
  Administered 2023-10-28: 2 [IU] via SUBCUTANEOUS

## 2023-10-26 MED ORDER — COLCHICINE 0.6 MG PO TABS
0.6000 mg | ORAL_TABLET | Freq: Two times a day (BID) | ORAL | Status: DC | PRN
  Administered 2023-10-27 – 2023-10-28 (×3): 0.6 mg via ORAL
  Filled 2023-10-26 (×4): qty 1

## 2023-10-26 NOTE — ED Triage Notes (Signed)
Pt bib ems from UC, for hypotension. Pt went to UC for leg pain due to gout but, had BP of 90/60 at UC, IV placed by UC, EMS gave 500 mls of fluid. BP 115/72 on Arrival. Hx of CHF, Afib, Gout. Released from [place] around noon

## 2023-10-26 NOTE — ED Provider Notes (Signed)
[facility] Provider Note   CSN: [id number] Arrival date & time: 10/26/23  1637     History  Chief Complaint  Patient presents with   Hypotension   Leg Pain    Thomas Donovan is a 62 y.o. male past medical history significant for gout, GI bleed, alcohol use disorder, A-fib presents today for hypotension and bilateral ankle pain.  Patient was brought in by EMS from urgent care for hypotension.  Blood pressure urgent care was 90/60, IV was placed by urgent care.  EMS gave 500 mL of fluid, patient's blood pressure was 115/72 on arrival.  Patient states he was released from jail today around noon and was not getting any food or drink while he was in jail for the past 17 days.  Patient also states they did not give him his gout medication.  Patient has no other complaints at this time.  Patient denies dizziness, chest pain, shortness of breath.   Leg Pain      Home Medications Prior to Admission medications   Medication Sig Start Date End Date Taking? Authorizing Provider  albuterol (VENTOLIN HFA) 108 (90 Base) MCG/ACT inhaler Inhale 2 puffs into the lungs every 6 (six) hours as needed for pain. 06/21/19  Yes [provider]  allopurinol (ZYLOPRIM) 100 MG tablet Take 200 mg by mouth daily. 10/24/23  Yes [provider]  finasteride (PROSCAR) 5 MG tablet Take 1 tablet by mouth daily. 10/06/23 10/05/24 Yes [provider]  levofloxacin (LEVAQUIN) 750 MG tablet Take 750 mg by mouth daily. 10/05/23  Yes [provider]  predniSONE (DELTASONE) 20 MG tablet Take 20 mg by mouth daily. 10/03/23  Yes [provider]  TRELEGY ELLIPTA 100-62.5-25 MCG/ACT AEPB Inhale 1 puff into the lungs daily. 07/31/23  Yes [provider]  atorvastatin (LIPITOR) 40 MG tablet Take 40 mg by mouth daily.    [provider]  colchicine 0.6 MG tablet Take 1-2 tablets (0.6-1.2 mg total) by mouth 2 (two) times  daily as needed (for gout flares). 08/23/17   Standley Brooking, MD  diclofenac Sodium (VOLTAREN) 1 % GEL Apply 2 g topically 4 (four) times daily as needed (back pain). 08/18/23   [provider]  diltiazem (CARDIZEM CD) 180 MG 24 hr capsule Take 180 mg by mouth daily. 10/05/23 10/04/24  [provider]  diltiazem (CARDIZEM) 30 MG tablet Take 30 mg by mouth 4 (four) times daily. 08/01/23   [provider]  ELIQUIS 5 MG TABS tablet Take 5 mg by mouth 2 (two) times daily. 08/14/23   [provider]  esomeprazole (NEXIUM) 40 MG capsule Take 40 mg by mouth daily. 07/31/23   [provider]  fenofibrate micronized (LOFIBRA) 134 MG capsule Take 134 mg by mouth daily. 07/31/23   [provider]  furosemide (LASIX) 40 MG tablet Take 40 mg by mouth daily. 09/14/23   [provider]  gabapentin (NEURONTIN) 300 MG capsule Take 600 mg by mouth 3 (three) times daily.    [provider]  glipiZIDE (GLUCOTROL) 10 MG tablet Take 20 mg by mouth daily. 07/31/23   [provider]  losartan (COZAAR) 100 MG tablet Take 100 mg by mouth daily.    [provider]  metFORMIN (GLUCOPHAGE) 850 MG tablet Take 850 mg by mouth every morning.    [provider]  RYBELSUS 14 MG TABS Take 1 tablet by mouth daily. 08/04/23   [provider]  tamsulosin (FLOMAX) 0.4 MG CAPS capsule Take 0.4 mg by mouth daily. 10/09/23   [provider]      Allergies    Celebrex [celecoxib], Guaifenesin, Ibuprofen, Tramadol, Acetaminophen, Aspirin, Ketorolac, and Toradol [ketorolac tromethamine]    Review of Systems   Review of Systems  Musculoskeletal:  Positive for arthralgias.    Physical Exam Updated Vital Signs BP 122/83   Pulse (!) 121   Resp (!) 31   Ht 6\' 1"  (1.854 m)   Wt 134.5 kg   SpO2 94%   BMI 39.12 kg/m  Physical Exam Vitals and nursing note reviewed.  Constitutional:      General: He is not in acute distress.     Appearance: He is well-developed. He is obese.  HENT:     Head: Normocephalic and atraumatic.     Right Ear: External ear normal.     Left Ear: External ear normal.     Nose: Nose normal.     Mouth/Throat:     Mouth: Mucous membranes are moist.  Eyes:     Conjunctiva/sclera: Conjunctivae normal.  Cardiovascular:     Rate and Rhythm: Tachycardia present.     Heart sounds: No murmur heard. Pulmonary:     Effort: Pulmonary effort is normal. No respiratory distress.     Breath sounds: Normal breath sounds.  Abdominal:     Palpations: Abdomen is soft.     Tenderness: There is no abdominal tenderness.  Musculoskeletal:        General: Swelling present. No signs of injury.     Cervical back: Neck supple.     Right lower leg: No edema.     Left lower leg: No edema.     Comments: Bilateral ankle swelling at lateral and medial malleoli without erythema  Skin:    General: Skin is warm and dry.     Capillary Refill: Capillary refill takes less than 2 seconds.  Neurological:     General: No focal deficit present.     Mental Status: He is alert.  Psychiatric:        Mood and Affect: Mood normal.     ED Results / Procedures / Treatments   Labs (all labs ordered are listed, but only abnormal results are displayed) Labs Reviewed  COMPREHENSIVE METABOLIC PANEL - Abnormal; Notable for the following components:      Result Value   Chloride 97 (*)    Glucose, Bld 185 (*)    BUN 42 (*)    Creatinine, Ser 2.91 (*)    Albumin 3.3 (*)    GFR, Estimated 24 (*)    All other components within normal limits  CBC WITH DIFFERENTIAL/PLATELET - Abnormal; Notable for the following components:   RBC 3.86 (*)    Hemoglobin 9.7 (*)    HCT 31.4 (*)    MCH 25.1 (*)    RDW 18.6 (*)    Platelets 424 (*)    All other components within normal limits  RAPID URINE DRUG SCREEN, HOSP PERFORMED - Abnormal; Notable for the following components:   Opiates POSITIVE (*)    Benzodiazepines POSITIVE (*)    All  other components within normal limits  URIC ACID - Abnormal; Notable for the following components:   Uric Acid, Serum 9.7 (*)    All other components within normal limits  D-DIMER, QUANTITATIVE  TROPONIN I (HIGH SENSITIVITY)  TROPONIN I (HIGH SENSITIVITY)    EKG EKG Interpretation Date/Time:  Thursday October 26 2023 17:54:14 EDT Ventricular  Rate:  109 PR Interval:    QRS Duration:  89 QT Interval:  335 QTC Calculation: 452 R Axis:   51  Text Interpretation: Atrial fibrillation Ventricular premature complex Borderline low voltage, extremity leads Confirmed by Kommor, Madison ([id number]) on 10/26/2023 6:18:05 PM  Radiology No results found.  Procedures Procedures    Medications Ordered in ED Medications  pantoprazole (PROTONIX) EC tablet 40 mg (40 mg Oral Given 10/26/23 1842)  diltiazem (CARDIZEM) 125 mg in dextrose 5% 125 mL (1 mg/mL) infusion (5 mg/hr Intravenous New Bag/Given 10/26/23 2010)  morphine (PF) 4 MG/ML injection 4 mg (4 mg Intravenous Given 10/26/23 1748)  colchicine tablet 0.6 mg (0.6 mg Oral Given 10/26/23 1747)  predniSONE (DELTASONE) tablet 50 mg (50 mg Oral Given 10/26/23 1746)  diltiazem (CARDIZEM) tablet 180 mg (180 mg Oral Given 10/26/23 1837)  morphine (PF) 4 MG/ML injection 4 mg (4 mg Intravenous Given 10/26/23 2025)    ED Course/ Medical Decision Making/ A&P                                 Medical Decision Making Risk Prescription drug management.   This patient presents to the ED with chief complaint(s) of gout and hypotension with pertinent past medical history of gout, A-fib, CHF which further complicates the presenting complaint. The complaint involves an extensive differential diagnosis and also carries with it a high risk of complications and morbidity.    The differential diagnosis includes gout, A-fib, arrhythmia, electrolyte deficiency  Additional history obtained: Records reviewed previous admission documents  ED Course and  Reassessment: Patient given morphine for pain Patient given colchicine and prednisone for gout Patient given diltiazem for A-fib, rate 119 approximately 1 hour after administration Patient started on diltiazem drip  Independent labs interpretation:  The following labs were independently interpreted:  CBC: Anemia which is chronic CMP: Increasing creatinine from 1.64 to 2.91, elevated bun of 42 Troponin: 7 EKG: A-fib Orthostatic vital signs: Pending Uric acid: 9.7 UDS: Opiate and benzodiazepine positive D-dimer: Pending  Consultation: - Consulted or discussed management/test interpretation w/ external professional: Hospitalist Dr. Thomes Dinning  Consideration for admission or further workup: Considering for admission for A-fib and AKI.  After speaking to hospitalist they are agreeable to admission.         Final Clinical Impression(s) / ED Diagnoses Final diagnoses:  AKI (acute kidney injury) (HCC)  Gout of ankle, unspecified cause, unspecified chronicity, unspecified laterality  Paroxysmal A-fib [facility])    Rx / DC Orders ED Discharge Orders     None         Gretta Began 10/26/23 2033    Glendora Score, MD 10/27/23 [phone]

## 2023-10-27 ENCOUNTER — Observation Stay (HOSPITAL_COMMUNITY): Payer: Medicare HMO

## 2023-10-27 ENCOUNTER — Encounter (HOSPITAL_COMMUNITY): Payer: Self-pay | Admitting: Internal Medicine

## 2023-10-27 DIAGNOSIS — I2510 Atherosclerotic heart disease of native coronary artery without angina pectoris: Secondary | ICD-10-CM | POA: Diagnosis present

## 2023-10-27 DIAGNOSIS — E1165 Type 2 diabetes mellitus with hyperglycemia: Secondary | ICD-10-CM | POA: Diagnosis present

## 2023-10-27 DIAGNOSIS — N184 Chronic kidney disease, stage 4 (severe): Secondary | ICD-10-CM | POA: Diagnosis present

## 2023-10-27 DIAGNOSIS — D75839 Thrombocytosis, unspecified: Secondary | ICD-10-CM | POA: Diagnosis present

## 2023-10-27 DIAGNOSIS — I130 Hypertensive heart and chronic kidney disease with heart failure and stage 1 through stage 4 chronic kidney disease, or unspecified chronic kidney disease: Secondary | ICD-10-CM | POA: Diagnosis present

## 2023-10-27 DIAGNOSIS — N400 Enlarged prostate without lower urinary tract symptoms: Secondary | ICD-10-CM | POA: Diagnosis present

## 2023-10-27 DIAGNOSIS — G8929 Other chronic pain: Secondary | ICD-10-CM | POA: Diagnosis present

## 2023-10-27 DIAGNOSIS — F1010 Alcohol abuse, uncomplicated: Secondary | ICD-10-CM | POA: Diagnosis present

## 2023-10-27 DIAGNOSIS — K219 Gastro-esophageal reflux disease without esophagitis: Secondary | ICD-10-CM | POA: Diagnosis present

## 2023-10-27 DIAGNOSIS — I480 Paroxysmal atrial fibrillation: Secondary | ICD-10-CM | POA: Diagnosis present

## 2023-10-27 DIAGNOSIS — F17210 Nicotine dependence, cigarettes, uncomplicated: Secondary | ICD-10-CM | POA: Diagnosis present

## 2023-10-27 DIAGNOSIS — Z23 Encounter for immunization: Secondary | ICD-10-CM | POA: Diagnosis present

## 2023-10-27 DIAGNOSIS — N179 Acute kidney failure, unspecified: Secondary | ICD-10-CM | POA: Diagnosis present

## 2023-10-27 DIAGNOSIS — E441 Mild protein-calorie malnutrition: Secondary | ICD-10-CM | POA: Diagnosis present

## 2023-10-27 DIAGNOSIS — E1122 Type 2 diabetes mellitus with diabetic chronic kidney disease: Secondary | ICD-10-CM | POA: Diagnosis present

## 2023-10-27 DIAGNOSIS — E669 Obesity, unspecified: Secondary | ICD-10-CM | POA: Diagnosis present

## 2023-10-27 DIAGNOSIS — Z7901 Long term (current) use of anticoagulants: Secondary | ICD-10-CM | POA: Diagnosis not present

## 2023-10-27 DIAGNOSIS — E8809 Other disorders of plasma-protein metabolism, not elsewhere classified: Secondary | ICD-10-CM | POA: Diagnosis present

## 2023-10-27 DIAGNOSIS — G4733 Obstructive sleep apnea (adult) (pediatric): Secondary | ICD-10-CM | POA: Diagnosis present

## 2023-10-27 DIAGNOSIS — I5032 Chronic diastolic (congestive) heart failure: Secondary | ICD-10-CM | POA: Diagnosis present

## 2023-10-27 DIAGNOSIS — F1410 Cocaine abuse, uncomplicated: Secondary | ICD-10-CM | POA: Diagnosis present

## 2023-10-27 DIAGNOSIS — R7989 Other specified abnormal findings of blood chemistry: Secondary | ICD-10-CM | POA: Insufficient documentation

## 2023-10-27 DIAGNOSIS — M109 Gout, unspecified: Secondary | ICD-10-CM | POA: Diagnosis present

## 2023-10-27 DIAGNOSIS — D649 Anemia, unspecified: Secondary | ICD-10-CM | POA: Diagnosis present

## 2023-10-27 LAB — COMPREHENSIVE METABOLIC PANEL
ALT: 20 U/L (ref 0–44)
AST: 18 U/L (ref 15–41)
Albumin: 3 g/dL — ABNORMAL LOW (ref 3.5–5.0)
Alkaline Phosphatase: 101 U/L (ref 38–126)
Anion gap: 11 (ref 5–15)
BUN: 45 mg/dL — ABNORMAL HIGH (ref 8–23)
CO2: 22 mmol/L (ref 22–32)
Calcium: 8.8 mg/dL — ABNORMAL LOW (ref 8.9–10.3)
Chloride: 99 mmol/L (ref 98–111)
Creatinine, Ser: 2.52 mg/dL — ABNORMAL HIGH (ref 0.61–1.24)
GFR, Estimated: 28 mL/min — ABNORMAL LOW (ref >60)
Glucose, Bld: 394 mg/dL — ABNORMAL HIGH (ref 70–99)
Potassium: 4.8 mmol/L (ref 3.5–5.1)
Sodium: 132 mmol/L — ABNORMAL LOW (ref 135–145)
Total Bilirubin: 0.5 mg/dL (ref 0.3–1.2)
Total Protein: 6.7 g/dL (ref 6.5–8.1)

## 2023-10-27 LAB — CBC
HCT: 30.9 % — ABNORMAL LOW (ref 39.0–52.0)
Hemoglobin: 9.6 g/dL — ABNORMAL LOW (ref 13.0–17.0)
MCH: 25.4 pg — ABNORMAL LOW (ref 26.0–34.0)
MCHC: 31.1 g/dL (ref 30.0–36.0)
MCV: 81.7 fL (ref 80.0–100.0)
Platelets: 401 10*3/uL — ABNORMAL HIGH (ref 150–400)
RBC: 3.78 MIL/uL — ABNORMAL LOW (ref 4.22–5.81)
RDW: 18.4 % — ABNORMAL HIGH (ref 11.5–15.5)
WBC: 6 10*3/uL (ref 4.0–10.5)
nRBC: 0 % (ref 0.0–0.2)

## 2023-10-27 LAB — MRSA NEXT GEN BY PCR, NASAL: MRSA by PCR Next Gen: NOT DETECTED

## 2023-10-27 LAB — GLUCOSE, CAPILLARY
Glucose-Capillary: 240 mg/dL — ABNORMAL HIGH (ref 70–99)
Glucose-Capillary: 251 mg/dL — ABNORMAL HIGH (ref 70–99)
Glucose-Capillary: 314 mg/dL — ABNORMAL HIGH (ref 70–99)
Glucose-Capillary: 320 mg/dL — ABNORMAL HIGH (ref 70–99)
Glucose-Capillary: 448 mg/dL — ABNORMAL HIGH (ref 70–99)

## 2023-10-27 LAB — MAGNESIUM: Magnesium: 1.5 mg/dL — ABNORMAL LOW (ref 1.7–2.4)

## 2023-10-27 LAB — PHOSPHORUS: Phosphorus: 5.1 mg/dL — ABNORMAL HIGH (ref 2.5–4.6)

## 2023-10-27 LAB — TSH: TSH: 1.284 u[IU]/mL (ref 0.350–4.500)

## 2023-10-27 MED ORDER — INSULIN GLARGINE-YFGN 100 UNIT/ML ~~LOC~~ SOLN
20.0000 [IU] | Freq: Every day | SUBCUTANEOUS | Status: DC
  Administered 2023-10-27: 20 [IU] via SUBCUTANEOUS
  Filled 2023-10-27 (×3): qty 0.2

## 2023-10-27 MED ORDER — LOSARTAN POTASSIUM 50 MG PO TABS: 100.0000 mg | ORAL_TABLET | Freq: Every day | ORAL | Status: DC

## 2023-10-27 MED ORDER — SODIUM CHLORIDE 0.9 % IV SOLN: INTRAVENOUS | Status: DC

## 2023-10-27 MED ORDER — TECHNETIUM TO 99M ALBUMIN AGGREGATED
4.0000 | Freq: Once | INTRAVENOUS | Status: AC | PRN
  Administered 2023-10-27: 4.3 via INTRAVENOUS

## 2023-10-27 MED ORDER — INFLUENZA VIRUS VACC SPLIT PF (FLUZONE) 0.5 ML IM SUSY
0.5000 mL | PREFILLED_SYRINGE | INTRAMUSCULAR | Status: AC
  Administered 2023-10-28: 0.5 mL via INTRAMUSCULAR
  Filled 2023-10-27: qty 0.5

## 2023-10-27 MED ORDER — SIMETHICONE 80 MG PO CHEW
80.0000 mg | CHEWABLE_TABLET | Freq: Four times a day (QID) | ORAL | Status: DC | PRN
  Administered 2023-10-27: 80 mg via ORAL
  Filled 2023-10-27: qty 1

## 2023-10-27 MED ORDER — DILTIAZEM HCL 30 MG PO TABS
30.0000 mg | ORAL_TABLET | Freq: Four times a day (QID) | ORAL | Status: DC
  Administered 2023-10-27 (×4): 30 mg via ORAL
  Filled 2023-10-27 (×5): qty 1

## 2023-10-27 MED ORDER — MAGNESIUM SULFATE 2 GM/50ML IV SOLN
2.0000 g | Freq: Once | INTRAVENOUS | Status: AC
  Administered 2023-10-27: 2 g via INTRAVENOUS
  Filled 2023-10-27: qty 50

## 2023-10-27 MED ORDER — SODIUM CHLORIDE 0.9 % IV SOLN: INTRAVENOUS | Status: AC

## 2023-10-27 NOTE — Inpatient Diabetes Management (Signed)
Inpatient Diabetes Program Recommendations  AACE/ADA: New Consensus Statement on Inpatient Glycemic Control (2015)  Target Ranges:  Prepandial:   less than 140 mg/dL      Peak postprandial:   less than 180 mg/dL (1-2 hours)      Critically ill patients:  140 - 180 mg/dL   Lab Results  Component Value Date   GLUCAP 320 (H) 10/27/2023   HGBA1C 6.9 (H) 09/18/2023    Latest Reference Range & Units 10/26/23 21:48 10/27/23 00:06 10/27/23 07:52  Glucose-Capillary 70 - 99 mg/dL 161 (H) 096 (H) 045 (H)  (H): Data is abnormally high Review of Glycemic Control  Diabetes history: DM2 Outpatient Diabetes medications: Glucotrol 20 mg daily, Metformin 850 mg daily, Rybelsus 14 mg daily Current orders for Inpatient glycemic control: Novolog 0-9 units tid  Inpatient Diabetes Program Recommendations:   Patient received Prednisone 50 mg x 1.  While oral DM meds on hold, please consider: -Semglee 20 units daily (0.15 units/kg x 134.5 kg) -Add Novolog 0-5 units hs  Thank you, Thomas Donovan. Thomas Mounsey, RN, MSN, CDCES  Diabetes Coordinator Inpatient Glycemic Control Team Team Pager [phone] (8am-5pm) 10/27/2023 9:45 AM

## 2023-10-27 NOTE — Progress Notes (Signed)
PROGRESS NOTE    Thomas Donovan  NGE:[id number] DOB: 1961/02/15 DOA: 10/26/2023 PCP: Carmel Sacramento, NP   Brief Narrative:    Thomas Donovan is a 62 y.o. male with medical history significant of f A-fib with RVR, type 2 diabetes mellitus, gout, chronic kidney disease stage III A, OSA (noncompliant with CPAP), polysubstance abuse (cocaine/alcohol), obesity who presents to the emergency department after being brought in by EMS from urgent care for hypotension where BP was noted to be 90/60 and 500 mL of IV fluid was given with  BP improving to 115/72 on arrival to the ED. he was admitted for AKI on CKD stage IV along with paroxysmal atrial fibrillation with RVR with questionable medication compliance while recently incarcerated.  Assessment & Plan:   Principal Problem:   Paroxysmal atrial fibrillation with RVR (HCC) Active Problems:   GERD without esophagitis   Uncontrolled type 2 diabetes mellitus with hyperglycemia, without long-term current use of insulin (HCC)   BPH (benign prostatic hyperplasia)   Acute kidney injury superimposed on CKD (HCC)   Hypoalbuminemia   Gout   Chronic heart failure with preserved ejection fraction (HFpEF) (HCC)   Drug abuse (HCC)   Obesity (BMI 30-39.9)   Thrombocytosis   OSA on CPAP   Chronic back pain   Elevated d-dimer  Assessment and Plan:   Paroxysmal atrial fibrillation with RVR-resolved Started on oral Cardizem with plans for consolidation in a.m. Resume Eliquis Obtain TSH Recent 2D echocardiogram performed with preserved LVEF and no need to repeat at this time   Acute kidney injury on CKD stage IV-improving BUN/creatinine 42/2.91, baseline creatinine at 1.4-1.7) Renally adjust medications, avoid nephrotoxic agents/dehydration/hypotension Continue IV fluid as ordered and monitor  Hypomagnesemia Replete and reevaluate in a.m.   Elevated D-Dimer D-Dimer 1.33 VQ scan will be done in the morning to rule out PE since CT of chest with  contrast will not be ideal in this patient with acute kidney injury.   Drug abuse Urine drug screen was positive for opiates and benzodiazepine Morphine was given in the ED Patient was counseled on drug abuse cessation   Thrombocytosis possibly reactive Platelets 424, continue to monitor platelet levels   Hypoalbuminemia possibly secondary to mild protein calorie malnutrition Albumin 3.3, protein supplement will be provided   Obesity (BMI 39.12) Diet and lifestyle modification   Uncontrolled type 2 diabetes mellitus with hyperglycemia Last A1c in September 2024 was 6.9 Continue ISS and hypoglycemia protocol Metformin and glipizide will be held at this time   Chronic back pain Continue oxycodone as needed   Chronic HFpEF Patient was hemodynamically stable at this time Continue total input/output, daily weights and fluid restriction Echocardiogram done in September 2024 showed LVEF of 60 to 65%, LV has no RWMA, mild LV, LV diastolic parameters are indeterminate.      GERD Continue Protonix   BPH Continue Flomax (patient denies any allergy to this medication), finasteride   Gout Continue colchicine, allopurinol   OSA on CPAP Continue CPAP    DVT prophylaxis: Eliquis Code Status: Full Family Communication: None at bedside Disposition Plan:  Status is: Inpatient Remains inpatient appropriate because: Need for IV medications and close monitoring   Consultants:  None  Procedures:  None  Antimicrobials:  None   Subjective: Patient seen and evaluated today with no new acute complaints or concerns. No acute concerns or events noted overnight.  Overall states he is starting to feel better.  Objective: Vitals:   10/27/23 0400 10/27/23 0500 10/27/23 0600  10/27/23 0700  BP: 130/76 (!) 118/56 (!) 140/93 131/65  Pulse: (!) 102 85 (!) 116 81  Resp: (!) 24 18 17  (!) 27  Temp: 98.8 F (37.1 C)     TempSrc: Oral     SpO2: 95% 100% 100% 95%  Weight:      Height:         Intake/Output Summary (Last 24 hours) at 10/27/2023 0834 Last data filed at 10/27/2023 0800 Gross per 24 hour  Intake 827.94 ml  Output 1000 ml  Net -172.06 ml   Filed Weights   10/26/23 1653  Weight: 134.5 kg    Examination:  General exam: Appears calm and comfortable  Respiratory system: Clear to auscultation. Respiratory effort normal. Cardiovascular system: S1 & S2 heard, irregular and tachycardic Gastrointestinal system: Abdomen is soft Central nervous system: Alert and awake Extremities: No edema Skin: No significant lesions noted Psychiatry: Flat affect.    Data Reviewed: I have personally reviewed following labs and imaging studies  CBC: Recent Labs  Lab 10/26/23 1830 10/27/23 0454  WBC 7.2 6.0  NEUTROABS 5.2  --   HGB 9.7* 9.6*  HCT 31.4* 30.9*  MCV 81.3 81.7  PLT 424* 401*   Basic Metabolic Panel: Recent Labs  Lab 10/26/23 1830 10/27/23 0454  NA 136 132*  K 3.7 4.8  CL 97* 99  CO2 24 22  GLUCOSE 185* 394*  BUN 42* 45*  CREATININE 2.91* 2.52*  CALCIUM 9.1 8.8*  MG  --  1.5*  PHOS  --  5.1*   GFR: Estimated Creatinine Clearance: 44.3 mL/min (A) (by C-G formula based on SCr of 2.52 mg/dL (H)). Liver Function Tests: Recent Labs  Lab 10/26/23 1830 10/27/23 0454  AST 23 18  ALT 25 20  ALKPHOS 110 101  BILITOT 0.4 0.5  PROT 7.4 6.7  ALBUMIN 3.3* 3.0*   No results for input(s): "LIPASE", "AMYLASE" in the last 168 hours. No results for input(s): "AMMONIA" in the last 168 hours. Coagulation Profile: No results for input(s): "INR", "PROTIME" in the last 168 hours. Cardiac Enzymes: No results for input(s): "CKTOTAL", "CKMB", "CKMBINDEX", "TROPONINI" in the last 168 hours. BNP (last 3 results) No results for input(s): "PROBNP" in the last 8760 hours. HbA1C: No results for input(s): "HGBA1C" in the last 72 hours. CBG: Recent Labs  Lab 10/26/23 2148 10/27/23 0006 10/27/23 0752  GLUCAP 298* 448* 320*   Lipid Profile: No results  for input(s): "CHOL", "HDL", "LDLCALC", "TRIG", "CHOLHDL", "LDLDIRECT" in the last 72 hours. Thyroid Function Tests: No results for input(s): "TSH", "T4TOTAL", "FREET4", "T3FREE", "THYROIDAB" in the last 72 hours. Anemia Panel: No results for input(s): "VITAMINB12", "FOLATE", "FERRITIN", "TIBC", "IRON", "RETICCTPCT" in the last 72 hours. Sepsis Labs: No results for input(s): "PROCALCITON", "LATICACIDVEN" in the last 168 hours.  Recent Results (from the past 240 hour(s))  MRSA Next Gen by PCR, Nasal     Status: None   Collection Time: 10/26/23 11:45 PM   Specimen: Nasal Mucosa; Nasal Swab  Result Value Ref Range Status   MRSA by PCR Next Gen NOT DETECTED NOT DETECTED Final    Comment: (NOTE) The GeneXpert MRSA Assay (FDA approved for NASAL specimens only), is one component of a comprehensive MRSA colonization surveillance program. It is not intended to diagnose MRSA infection nor to guide or monitor treatment for MRSA infections. Test performance is not FDA approved in patients less than 50 years old. Performed at [facility], [address] [place], [place] [address]  Radiology Studies: No results found.      Scheduled Meds:  allopurinol  200 mg Oral Daily   atorvastatin  40 mg Oral Daily   Chlorhexidine Gluconate Cloth  6 each Topical Daily   feeding supplement (GLUCERNA SHAKE)  237 mL Oral TID BM   fenofibrate  160 mg Oral Daily   finasteride  5 mg Oral Daily   [START ON 10/28/2023] influenza vac split trivalent PF  0.5 mL Intramuscular Tomorrow-1000   insulin aspart  0-9 Units Subcutaneous TID WC   pantoprazole  40 mg Oral Daily   tamsulosin  0.4 mg Oral Daily   Continuous Infusions:  diltiazem (CARDIZEM) infusion 5 mg/hr (10/26/23 2010)   magnesium sulfate bolus IVPB       LOS: 0 days    Time spent: 55 minutes    Mahonri Seiden Hoover Brunette, DO [facility]  If 7PM-7AM, please contact night-coverage [email or web address] 10/27/2023, 8:34 AM

## 2023-10-27 NOTE — Progress Notes (Signed)
RT placed patient on CPAP with nasal mask 15/5 with 21% FIO2. Patient immediately began to complain about the fit of mask and noise coming form mask while he was talking around mask. Patient asked to removed CPAP and he would rather sleep with 2LPM [place]. RT placed on 2LPM [place] with saturations at 99%.

## 2023-10-27 NOTE — Progress Notes (Signed)
   10/27/23 1428  TOC Brief Assessment  Insurance and Status Reviewed  Patient has primary care physician Yes  Home environment has been reviewed lives at home  Prior level of function: independent  Prior/Current Home Services No current home services  Social Determinants of Health Reivew SDOH reviewed no interventions necessary  Readmission risk has been reviewed Yes  Transition of care needs no transition of care needs at this time    Transition of Care Department [facility]) has reviewed patient and no TOC needs have been identified at this time. We will continue to monitor patient advancement through interdisciplinary progression rounds. If new patient transition needs arise, please place a TOC consult.

## 2023-10-27 NOTE — Plan of Care (Signed)

## 2023-10-27 NOTE — Progress Notes (Signed)
Pt arrived to room 330 via WC from ICU. Pt ambulatory from chair to bathroom and then to bed. Pt states difficulty walking and trouble with balance due to gout in ankles and swelling in bilateral feet and legs. Pt is requesting, "a high dose of Lasix to get this fluid off of me."  Pt refuses to wear non-slip socks due to swelling in feet and pain from gout. Counseled pt on rationale for non-slip socks for safety, pt states understanding but still declines. Tele on, IVF infusing per order. Bed alarm on, call bell within reach, bed in lowest position. Advised to call for needs, pt states understanding.

## 2023-10-27 NOTE — Progress Notes (Signed)
Report called to [place] RN for pt transport to room 330. New set of vitals collected per request. MEWS score 1. No complaints from patient at this time. Pt transported via wheelchair with IVF infusing and all belongings transported with patient to include, cell phone, clothes, boots, aftershave, toiletries.

## 2023-10-28 DIAGNOSIS — I480 Paroxysmal atrial fibrillation: Secondary | ICD-10-CM | POA: Diagnosis not present

## 2023-10-28 LAB — GLUCOSE, CAPILLARY
Glucose-Capillary: 165 mg/dL — ABNORMAL HIGH (ref 70–99)
Glucose-Capillary: 165 mg/dL — ABNORMAL HIGH (ref 70–99)

## 2023-10-28 LAB — BASIC METABOLIC PANEL
Anion gap: 7 (ref 5–15)
BUN: 40 mg/dL — ABNORMAL HIGH (ref 8–23)
CO2: 25 mmol/L (ref 22–32)
Calcium: 8.8 mg/dL — ABNORMAL LOW (ref 8.9–10.3)
Chloride: 102 mmol/L (ref 98–111)
Creatinine, Ser: 1.85 mg/dL — ABNORMAL HIGH (ref 0.61–1.24)
GFR, Estimated: 41 mL/min — ABNORMAL LOW (ref >60)
Glucose, Bld: 194 mg/dL — ABNORMAL HIGH (ref 70–99)
Potassium: 3.7 mmol/L (ref 3.5–5.1)
Sodium: 134 mmol/L — ABNORMAL LOW (ref 135–145)

## 2023-10-28 LAB — CBC
HCT: 29.2 % — ABNORMAL LOW (ref 39.0–52.0)
Hemoglobin: 9 g/dL — ABNORMAL LOW (ref 13.0–17.0)
MCH: 25.1 pg — ABNORMAL LOW (ref 26.0–34.0)
MCHC: 30.8 g/dL (ref 30.0–36.0)
MCV: 81.6 fL (ref 80.0–100.0)
Platelets: 362 10*3/uL (ref 150–400)
RBC: 3.58 MIL/uL — ABNORMAL LOW (ref 4.22–5.81)
RDW: 18 % — ABNORMAL HIGH (ref 11.5–15.5)
WBC: 7.2 10*3/uL (ref 4.0–10.5)
nRBC: 0 % (ref 0.0–0.2)

## 2023-10-28 LAB — MAGNESIUM: Magnesium: 1.7 mg/dL (ref 1.7–2.4)

## 2023-10-28 MED ORDER — DICLOFENAC SODIUM 1 % EX GEL: 2.0000 g | Freq: Four times a day (QID) | CUTANEOUS | 0 refills | Status: DC | PRN

## 2023-10-28 MED ORDER — TAMSULOSIN HCL 0.4 MG PO CAPS: 0.4000 mg | ORAL_CAPSULE | Freq: Every day | ORAL | 1 refills | Status: DC

## 2023-10-28 MED ORDER — ESOMEPRAZOLE MAGNESIUM 40 MG PO CPDR: 40.0000 mg | DELAYED_RELEASE_CAPSULE | Freq: Every day | ORAL | 1 refills | Status: DC

## 2023-10-28 MED ORDER — ELIQUIS 5 MG PO TABS: 5.0000 mg | ORAL_TABLET | Freq: Two times a day (BID) | ORAL | 2 refills | Status: DC

## 2023-10-28 MED ORDER — FUROSEMIDE 40 MG PO TABS: 40.0000 mg | ORAL_TABLET | Freq: Every day | ORAL | 1 refills | Status: DC

## 2023-10-28 MED ORDER — COLCHICINE 0.6 MG PO TABS: 0.6000 mg | ORAL_TABLET | Freq: Two times a day (BID) | ORAL | 0 refills | Status: DC | PRN

## 2023-10-28 MED ORDER — GLIPIZIDE 10 MG PO TABS: 20.0000 mg | ORAL_TABLET | Freq: Every day | ORAL | 1 refills | Status: DC

## 2023-10-28 MED ORDER — ALBUTEROL SULFATE HFA 108 (90 BASE) MCG/ACT IN AERS: 2.0000 | INHALATION_SPRAY | Freq: Four times a day (QID) | RESPIRATORY_TRACT | 0 refills | Status: DC | PRN

## 2023-10-28 MED ORDER — FUROSEMIDE 40 MG PO TABS
40.0000 mg | ORAL_TABLET | Freq: Every day | ORAL | Status: DC
  Administered 2023-10-28: 40 mg via ORAL
  Filled 2023-10-28: qty 1

## 2023-10-28 MED ORDER — ATORVASTATIN CALCIUM 40 MG PO TABS: 40.0000 mg | ORAL_TABLET | Freq: Every day | ORAL | 1 refills | Status: DC

## 2023-10-28 MED ORDER — TRELEGY ELLIPTA 100-62.5-25 MCG/ACT IN AEPB: 1.0000 | INHALATION_SPRAY | Freq: Every day | RESPIRATORY_TRACT | 1 refills | Status: DC

## 2023-10-28 MED ORDER — METFORMIN HCL 850 MG PO TABS: 850.0000 mg | ORAL_TABLET | Freq: Every morning | ORAL | 0 refills | Status: DC

## 2023-10-28 MED ORDER — DILTIAZEM HCL ER COATED BEADS 180 MG PO CP24: 180.0000 mg | ORAL_CAPSULE | Freq: Every day | ORAL | 1 refills | Status: DC

## 2023-10-28 MED ORDER — FENOFIBRATE MICRONIZED 134 MG PO CAPS: 134.0000 mg | ORAL_CAPSULE | Freq: Every day | ORAL | 1 refills | Status: DC

## 2023-10-28 MED ORDER — DILTIAZEM HCL ER COATED BEADS 180 MG PO CP24
180.0000 mg | ORAL_CAPSULE | Freq: Every day | ORAL | Status: DC
  Administered 2023-10-28: 180 mg via ORAL
  Filled 2023-10-28: qty 1

## 2023-10-28 MED ORDER — FINASTERIDE 5 MG PO TABS: 5.0000 mg | ORAL_TABLET | Freq: Every day | ORAL | 11 refills | Status: DC

## 2023-10-28 NOTE — Discharge Summary (Signed)
Physician Discharge Summary  Thomas Donovan ZOX:[id number] DOB: April 08, 1961 DOA: 10/26/2023  PCP: Carmel Sacramento, NP  Admit date: 10/26/2023  Discharge date: 10/28/2023  Admitted From:Home  Disposition:  Home  Recommendations for Outpatient Follow-up:  Follow up with PCP in 1-2 weeks, needs to establish with new visit Remain on medications as provided below with refills Counseled on cessation of alcohol use Needs outpatient referral to urology and GI for cancer screening  Home Health: None  Equipment/Devices: None  Discharge Condition:Stable  CODE STATUS: Full  Diet recommendation: Heart Healthy/carb modified  Brief/Interim Summary: Thomas Donovan is a 62 y.o. male with medical history significant of f A-fib with RVR, type 2 diabetes mellitus, gout, chronic kidney disease stage III A, OSA (noncompliant with CPAP), polysubstance abuse (cocaine/alcohol), obesity who presents to the emergency department after being brought in by EMS from urgent care for hypotension where BP was noted to be 90/60 and 500 mL of IV fluid was given with  BP improving to 115/72 on arrival to the ED. He was admitted for AKI on CKD stage IV along with paroxysmal atrial fibrillation with RVR with questionable medication compliance while recently incarcerated.  He was briefly started on IV Cardizem drip and was easily transition to his oral Cardizem medication.  He was noted to have AKI on admission which resolved with the use of IV fluid.  He is now in stable condition for discharge with usual home medications which have been refilled.  He agrees to follow-up with PCP outpatient and needs follow-up regarding cancer screening for his prostate as well as follow-up to GI for further evaluation of colon concerns/polyps.  Discharge Diagnoses:  Principal Problem:   Paroxysmal atrial fibrillation with RVR (HCC) Active Problems:   GERD without esophagitis   Uncontrolled type 2 diabetes mellitus with hyperglycemia,  without long-term current use of insulin (HCC)   BPH (benign prostatic hyperplasia)   Acute kidney injury superimposed on CKD (HCC)   Hypoalbuminemia   Gout   Chronic heart failure with preserved ejection fraction (HFpEF) (HCC)   Drug abuse (HCC)   Obesity (BMI 30-39.9)   Thrombocytosis   OSA on CPAP   Chronic back pain   Elevated d-dimer  Principal discharge diagnosis: Paroxysmal atrial fibrillation with RVR in the setting of poor medication compliance along with AKI-resolved.  Discharge Instructions  Discharge Instructions     Diet - low sodium heart healthy   Complete by: As directed    Increase activity slowly   Complete by: As directed       Allergies as of 10/28/2023       Reactions   Celebrex [celecoxib] Anaphylaxis   Guaifenesin Anaphylaxis   Ibuprofen Other (See Comments), Hives   Reaction:  GI bleeding Other Reaction(s): OTC NSAIDS CAUSE BLOOD IN STOOLS   Tramadol Hives, Rash   Acetaminophen    Aspirin Other (See Comments)   Medication is contraindicated with pts gout medicine. Other Reaction(s): PT WAS TOLD NOT TO TAKE BY MD DUE TO BEING ON COLCHICINE.   Ketorolac    Other Reaction(s): HIVES, BLISTERS BTW FINGERS.   Toradol [ketorolac Tromethamine] Other (See Comments)   Reaction:  Blisters between fingers         Medication List     STOP taking these medications    allopurinol 100 MG tablet Commonly known as: ZYLOPRIM   diltiazem 30 MG tablet Commonly known as: CARDIZEM   gabapentin 300 MG capsule Commonly known as: NEURONTIN   levofloxacin 750 MG tablet Commonly  known as: LEVAQUIN   losartan 100 MG tablet Commonly known as: COZAAR   predniSONE 20 MG tablet Commonly known as: DELTASONE       TAKE these medications    albuterol 108 (90 Base) MCG/ACT inhaler Commonly known as: VENTOLIN HFA Inhale 2 puffs into the lungs every 6 (six) hours as needed for wheezing or shortness of breath. What changed: reasons to take this    atorvastatin 40 MG tablet Commonly known as: LIPITOR Take 1 tablet (40 mg total) by mouth daily.   colchicine 0.6 MG tablet Take 1-2 tablets (0.6-1.2 mg total) by mouth 2 (two) times daily as needed (for gout flares).   diclofenac Sodium 1 % Gel Commonly known as: VOLTAREN Apply 2 g topically 4 (four) times daily as needed (back pain).   diltiazem 180 MG 24 hr capsule Commonly known as: CARDIZEM CD Take 1 capsule (180 mg total) by mouth daily.   Eliquis 5 MG Tabs tablet Generic drug: apixaban Take 1 tablet (5 mg total) by mouth 2 (two) times daily.   esomeprazole 40 MG capsule Commonly known as: NEXIUM Take 1 capsule (40 mg total) by mouth daily.   fenofibrate micronized 134 MG capsule Commonly known as: LOFIBRA Take 1 capsule (134 mg total) by mouth daily.   finasteride 5 MG tablet Commonly known as: PROSCAR Take 1 tablet (5 mg total) by mouth daily.   furosemide 40 MG tablet Commonly known as: LASIX Take 1 tablet (40 mg total) by mouth daily.   glipiZIDE 10 MG tablet Commonly known as: GLUCOTROL Take 2 tablets (20 mg total) by mouth daily.   metFORMIN 850 MG tablet Commonly known as: GLUCOPHAGE Take 1 tablet (850 mg total) by mouth every morning.   Rybelsus 14 MG Tabs Generic drug: Semaglutide Take 1 tablet by mouth daily.   tamsulosin 0.4 MG Caps capsule Commonly known as: FLOMAX Take 1 capsule (0.4 mg total) by mouth daily.   Trelegy Ellipta 100-62.5-25 MCG/ACT Aepb Generic drug: Fluticasone-Umeclidin-Vilant Inhale 1 puff into the lungs daily.        Follow-up Information     Dettinger, Elige Radon, MD. Schedule an appointment as soon as possible for a visit.   Specialties: Family Medicine, Cardiology Why: Please select from one following listed practices to contact and schedule an appointment. This will be a new patient appointment to establish primary care services. You may also contact the number on the back of your insurance card for a list of in  network providers. Contact information: [address] [place] [address] [phone]         Tommie Sams, DO. Schedule an appointment as soon as possible for a visit.   Specialty: Family Medicine Why: Please select from one following listed practices to contact and schedule an appointment. This will be a new patient appointment to establish primary care services. You may also contact the number on the back of your insurance card for a list of in network providers. Contact information: [address] [place] [address] [phone]         Anabel Halon, MD. Schedule an appointment as soon as possible for a visit.   Specialty: Internal Medicine Why: Please select from one following listed practices to contact and schedule an appointment. This will be a new patient appointment to establish primary care services. You may also contact the number on the back of your insurance card for a list of in network providers. Contact information: [address]  [address] [place] [address] [phone]                Allergies  Allergen Reactions   Celebrex [Celecoxib] Anaphylaxis   Guaifenesin Anaphylaxis   Ibuprofen Other (See Comments) and Hives    Reaction:  GI bleeding  Other Reaction(s): OTC NSAIDS CAUSE BLOOD IN STOOLS   Tramadol Hives and Rash   Acetaminophen    Aspirin Other (See Comments)    Medication is contraindicated with pts gout medicine.  Other Reaction(s): PT WAS TOLD NOT TO TAKE BY MD DUE TO BEING ON COLCHICINE.   Ketorolac     Other Reaction(s): HIVES, BLISTERS BTW FINGERS.   Toradol [Ketorolac Tromethamine] Other (See Comments)    Reaction:  Blisters between fingers     Consultations: None   Procedures/Studies: NM Pulmonary Perfusion  Result Date: 10/27/2023 CLINICAL DATA:  PE suspected.  Positive D-dimer EXAM: NUCLEAR MEDICINE PERFUSION LUNG SCAN TECHNIQUE: Perfusion images were obtained in multiple projections after intravenous  injection of radiopharmaceutical. RADIOPHARMACEUTICALS:  4.3 mCi Tc-12m MAA COMPARISON:  None Available. FINDINGS: No wedge-shaped peripheral perfusion defect within LEFT or RIGHT lung to suggest acute pulmonary embolism. Normal perfusion pattern. IMPRESSION: No evidence acute pulmonary embolism. Electronically Signed   By: Genevive Bi M.D.   On: 10/27/2023 16:32   DG CHEST PORT 1 VIEW  Result Date: 10/27/2023 CLINICAL DATA:  Dyspnea. EXAM: PORTABLE CHEST 1 VIEW COMPARISON:  December 23, 2023. FINDINGS: The heart size and mediastinal contours are within normal limits. Both lungs are clear. The visualized skeletal structures are unremarkable. IMPRESSION: No active disease. Electronically Signed   By: Lupita Raider M.D.   On: 10/27/2023 16:31     Discharge Exam: Vitals:   10/27/23 2159 10/28/23 0400  BP: 132/73 135/81  Pulse:  89  Resp:  18  Temp:  97.8 F (36.6 C)  SpO2:  98%   Vitals:   10/27/23 2028 10/27/23 2159 10/28/23 0400 10/28/23 0640  BP: 132/73 132/73 135/81   Pulse: 79  89   Resp:   18   Temp: 97.6 F (36.4 C)  97.8 F (36.6 C)   TempSrc: Oral     SpO2: 97%  98%   Weight:    121.3 kg  Height:        General: Pt is alert, awake, not in acute distress Cardiovascular: RRR, S1/S2 +, no rubs, no gallops Respiratory: CTA bilaterally, no wheezing, no rhonchi Abdominal: Soft, NT, ND, bowel sounds + Extremities: no edema, no cyanosis    The results of significant diagnostics from this hospitalization (including imaging, microbiology, ancillary and laboratory) are listed below for reference.     Microbiology: Recent Results (from the past 240 hour(s))  MRSA Next Gen by PCR, Nasal     Status: None   Collection Time: 10/26/23 11:45 PM   Specimen: Nasal Mucosa; Nasal Swab  Result Value Ref Range Status   MRSA by PCR Next Gen NOT DETECTED NOT DETECTED Final    Comment: (NOTE) The GeneXpert MRSA Assay (FDA approved for NASAL specimens only), is one component of a  comprehensive MRSA colonization surveillance program. It is not intended to diagnose MRSA infection nor to guide or monitor treatment for MRSA infections. Test performance is not FDA approved in patients less than 52 years old. Performed at [facility], [address] [place], [place] [address]      Labs: BNP (last 3 results) Recent Labs    09/18/23 0942  BNP 158.0*   Basic Metabolic Panel:  Recent Labs  Lab 10/26/23 1830 10/27/23 0454 10/28/23 0434  NA 136 132* 134*  K 3.7 4.8 3.7  CL 97* 99 102  CO2 24 22 25   GLUCOSE 185* 394* 194*  BUN 42* 45* 40*  CREATININE 2.91* 2.52* 1.85*  CALCIUM 9.1 8.8* 8.8*  MG  --  1.5* 1.7  PHOS  --  5.1*  --    Liver Function Tests: Recent Labs  Lab 10/26/23 1830 10/27/23 0454  AST 23 18  ALT 25 20  ALKPHOS 110 101  BILITOT 0.4 0.5  PROT 7.4 6.7  ALBUMIN 3.3* 3.0*   No results for input(s): "LIPASE", "AMYLASE" in the last 168 hours. No results for input(s): "AMMONIA" in the last 168 hours. CBC: Recent Labs  Lab 10/26/23 1830 10/27/23 0454 10/28/23 0434  WBC 7.2 6.0 7.2  NEUTROABS 5.2  --   --   HGB 9.7* 9.6* 9.0*  HCT 31.4* 30.9* 29.2*  MCV 81.3 81.7 81.6  PLT 424* 401* 362   Cardiac Enzymes: No results for input(s): "CKTOTAL", "CKMB", "CKMBINDEX", "TROPONINI" in the last 168 hours. BNP: Invalid input(s): "POCBNP" CBG: Recent Labs  Lab 10/27/23 0752 10/27/23 1202 10/27/23 1615 10/27/23 2034 10/28/23 0736  GLUCAP 320* 314* 240* 251* 165*   D-Dimer Recent Labs    10/26/23 2133  DDIMER 1.33*   Hgb A1c No results for input(s): "HGBA1C" in the last 72 hours. Lipid Profile No results for input(s): "CHOL", "HDL", "LDLCALC", "TRIG", "CHOLHDL", "LDLDIRECT" in the last 72 hours. Thyroid function studies Recent Labs    10/27/23 0454  TSH 1.284   Anemia work up No results for input(s): "VITAMINB12", "FOLATE", "FERRITIN", "TIBC", "IRON", "RETICCTPCT" in the last 72 hours. Urinalysis    Component Value  Date/Time   COLORURINE YELLOW 09/18/2023 1050   APPEARANCEUR CLEAR 09/18/2023 1050   LABSPEC 1.014 09/18/2023 1050   PHURINE 6.0 09/18/2023 1050   GLUCOSEU 50 (A) 09/18/2023 1050   HGBUR NEGATIVE 09/18/2023 1050   BILIRUBINUR NEGATIVE 09/18/2023 1050   KETONESUR NEGATIVE 09/18/2023 1050   PROTEINUR 100 (A) 09/18/2023 1050   UROBILINOGEN 0.2 01/01/2015 0912   NITRITE NEGATIVE 09/18/2023 1050   LEUKOCYTESUR NEGATIVE 09/18/2023 1050   Sepsis Labs Recent Labs  Lab 10/26/23 1830 10/27/23 0454 10/28/23 0434  WBC 7.2 6.0 7.2   Microbiology Recent Results (from the past 240 hour(s))  MRSA Next Gen by PCR, Nasal     Status: None   Collection Time: 10/26/23 11:45 PM   Specimen: Nasal Mucosa; Nasal Swab  Result Value Ref Range Status   MRSA by PCR Next Gen NOT DETECTED NOT DETECTED Final    Comment: (NOTE) The GeneXpert MRSA Assay (FDA approved for NASAL specimens only), is one component of a comprehensive MRSA colonization surveillance program. It is not intended to diagnose MRSA infection nor to guide or monitor treatment for MRSA infections. Test performance is not FDA approved in patients less than 39 years old. Performed at [facility], [address] [place], [place] [address]      Time coordinating discharge: 35 minutes  SIGNED:   Erick Blinks, DO [facility] 10/28/2023, 10:34 AM  If 7PM-7AM, please contact night-coverage [email or web address]

## 2023-10-28 NOTE — TOC Transition Note (Signed)
Transition of Care [facility]) - CM/SW Discharge Note   Patient Details  Name: Thomas Donovan MRN: [id number] Date of Birth: September 18, 1961  [facility] CM/SW Contact:  Princella Ion, LCSW Phone Number: 10/28/2023, 10:21 AM   Clinical Narrative:    CSW was informed by provider that pt was in need of list of PCPs. CSW attached three primary care practices that are near pt's home location to pt's discharge paperwork and encouraged review and to call to schedule an appointment. CSW also informed that patient can contact the number on the back of his insurance card to see which providers are in network with his insurance. CSW attempted to contact pt's phone; however, phone is disconnected/no longer in service. No further TOC needs at this time.          Patient Goals and CMS Choice      Discharge Placement                         Discharge Plan and Services Additional resources added to the After Visit Summary for                                       Social Determinants of Health (SDOH) Interventions SDOH Screenings   Food Insecurity: No Food Insecurity (10/27/2023)  Housing: Low Risk  (10/27/2023)  Recent Concern: Housing - High Risk (09/19/2023)  Transportation Needs: Patient Declined (10/27/2023)  Utilities: Not At Risk (10/27/2023)  Financial Resource Strain: Medium Risk (01/05/2023)   [facility] from [facility]  Physical Activity: Inactive (10/04/2023)   Received from [facility]  Social Connections: Moderately Integrated (10/04/2023)   Received from [facility]  Stress: Stress Concern Present (10/04/2023)   Received from [facility]  Tobacco Use: Medium Risk (10/26/2023)   Received from [facility] Literacy: Medium Risk (10/04/2023)   Received from [facility]     Readmission Risk Interventions     No data to display

## 2023-10-28 NOTE — Plan of Care (Signed)

## 2023-10-31 ENCOUNTER — Inpatient Hospital Stay (HOSPITAL_COMMUNITY)
Admission: EM | Admit: 2023-10-31 | Discharge: 2023-11-03 | DRG: 391 | Disposition: A | Discharge status: Home / Self Care | Payer: Medicare HMO | Attending: Family Medicine | Admitting: Family Medicine

## 2023-10-31 ENCOUNTER — Encounter (HOSPITAL_COMMUNITY): Payer: Self-pay | Admitting: *Deleted

## 2023-10-31 ENCOUNTER — Other Ambulatory Visit: Payer: Self-pay

## 2023-10-31 ENCOUNTER — Emergency Department (HOSPITAL_COMMUNITY): Payer: Medicare HMO

## 2023-10-31 DIAGNOSIS — M109 Gout, unspecified: Secondary | ICD-10-CM | POA: Diagnosis present

## 2023-10-31 DIAGNOSIS — R1030 Lower abdominal pain, unspecified: Secondary | ICD-10-CM | POA: Diagnosis not present

## 2023-10-31 DIAGNOSIS — K7030 Alcoholic cirrhosis of liver without ascites: Secondary | ICD-10-CM

## 2023-10-31 DIAGNOSIS — F1010 Alcohol abuse, uncomplicated: Secondary | ICD-10-CM | POA: Diagnosis present

## 2023-10-31 DIAGNOSIS — K922 Gastrointestinal hemorrhage, unspecified: Secondary | ICD-10-CM | POA: Diagnosis not present

## 2023-10-31 DIAGNOSIS — K529 Noninfective gastroenteritis and colitis, unspecified: Secondary | ICD-10-CM | POA: Diagnosis not present

## 2023-10-31 DIAGNOSIS — E876 Hypokalemia: Secondary | ICD-10-CM | POA: Diagnosis present

## 2023-10-31 DIAGNOSIS — N402 Nodular prostate without lower urinary tract symptoms: Secondary | ICD-10-CM

## 2023-10-31 DIAGNOSIS — E1165 Type 2 diabetes mellitus with hyperglycemia: Secondary | ICD-10-CM

## 2023-10-31 DIAGNOSIS — G8929 Other chronic pain: Secondary | ICD-10-CM | POA: Diagnosis present

## 2023-10-31 DIAGNOSIS — K5732 Diverticulitis of large intestine without perforation or abscess without bleeding: Secondary | ICD-10-CM

## 2023-10-31 DIAGNOSIS — K7460 Unspecified cirrhosis of liver: Secondary | ICD-10-CM | POA: Diagnosis present

## 2023-10-31 DIAGNOSIS — I129 Hypertensive chronic kidney disease with stage 1 through stage 4 chronic kidney disease, or unspecified chronic kidney disease: Secondary | ICD-10-CM | POA: Diagnosis present

## 2023-10-31 DIAGNOSIS — I480 Paroxysmal atrial fibrillation: Secondary | ICD-10-CM | POA: Diagnosis present

## 2023-10-31 DIAGNOSIS — Z5986 Financial insecurity: Secondary | ICD-10-CM

## 2023-10-31 DIAGNOSIS — Z6836 Body mass index (BMI) 36.0-36.9, adult: Secondary | ICD-10-CM

## 2023-10-31 DIAGNOSIS — F1410 Cocaine abuse, uncomplicated: Secondary | ICD-10-CM | POA: Diagnosis present

## 2023-10-31 DIAGNOSIS — K6289 Other specified diseases of anus and rectum: Secondary | ICD-10-CM | POA: Diagnosis present

## 2023-10-31 DIAGNOSIS — K641 Second degree hemorrhoids: Secondary | ICD-10-CM

## 2023-10-31 DIAGNOSIS — J449 Chronic obstructive pulmonary disease, unspecified: Secondary | ICD-10-CM | POA: Diagnosis present

## 2023-10-31 DIAGNOSIS — T80818A Extravasation of other vesicant agent, initial encounter: Secondary | ICD-10-CM | POA: Diagnosis present

## 2023-10-31 DIAGNOSIS — E669 Obesity, unspecified: Secondary | ICD-10-CM | POA: Diagnosis present

## 2023-10-31 DIAGNOSIS — Z7901 Long term (current) use of anticoagulants: Secondary | ICD-10-CM

## 2023-10-31 DIAGNOSIS — E1122 Type 2 diabetes mellitus with diabetic chronic kidney disease: Secondary | ICD-10-CM | POA: Diagnosis present

## 2023-10-31 DIAGNOSIS — K6389 Other specified diseases of intestine: Secondary | ICD-10-CM | POA: Diagnosis present

## 2023-10-31 DIAGNOSIS — D62 Acute posthemorrhagic anemia: Secondary | ICD-10-CM | POA: Diagnosis present

## 2023-10-31 DIAGNOSIS — Z886 Allergy status to analgesic agent status: Secondary | ICD-10-CM

## 2023-10-31 DIAGNOSIS — Z7984 Long term (current) use of oral hypoglycemic drugs: Secondary | ICD-10-CM

## 2023-10-31 DIAGNOSIS — C61 Malignant neoplasm of prostate: Secondary | ICD-10-CM | POA: Diagnosis present

## 2023-10-31 DIAGNOSIS — D125 Benign neoplasm of sigmoid colon: Secondary | ICD-10-CM | POA: Diagnosis present

## 2023-10-31 DIAGNOSIS — D127 Benign neoplasm of rectosigmoid junction: Secondary | ICD-10-CM | POA: Diagnosis present

## 2023-10-31 DIAGNOSIS — Z888 Allergy status to other drugs, medicaments and biological substances status: Secondary | ICD-10-CM

## 2023-10-31 DIAGNOSIS — K621 Rectal polyp: Secondary | ICD-10-CM | POA: Diagnosis present

## 2023-10-31 DIAGNOSIS — Z8701 Personal history of pneumonia (recurrent): Secondary | ICD-10-CM

## 2023-10-31 DIAGNOSIS — E119 Type 2 diabetes mellitus without complications: Secondary | ICD-10-CM

## 2023-10-31 DIAGNOSIS — Z79899 Other long term (current) drug therapy: Secondary | ICD-10-CM

## 2023-10-31 DIAGNOSIS — G4733 Obstructive sleep apnea (adult) (pediatric): Secondary | ICD-10-CM | POA: Diagnosis present

## 2023-10-31 DIAGNOSIS — K219 Gastro-esophageal reflux disease without esophagitis: Secondary | ICD-10-CM | POA: Diagnosis present

## 2023-10-31 DIAGNOSIS — F17210 Nicotine dependence, cigarettes, uncomplicated: Secondary | ICD-10-CM | POA: Diagnosis present

## 2023-10-31 DIAGNOSIS — M549 Dorsalgia, unspecified: Secondary | ICD-10-CM | POA: Diagnosis present

## 2023-10-31 DIAGNOSIS — K5733 Diverticulitis of large intestine without perforation or abscess with bleeding: Principal | ICD-10-CM | POA: Diagnosis present

## 2023-10-31 DIAGNOSIS — K5791 Diverticulosis of intestine, part unspecified, without perforation or abscess with bleeding: Secondary | ICD-10-CM | POA: Diagnosis present

## 2023-10-31 DIAGNOSIS — N1831 Chronic kidney disease, stage 3a: Secondary | ICD-10-CM | POA: Diagnosis present

## 2023-10-31 DIAGNOSIS — K625 Hemorrhage of anus and rectum: Secondary | ICD-10-CM | POA: Diagnosis not present

## 2023-10-31 DIAGNOSIS — Z7951 Long term (current) use of inhaled steroids: Secondary | ICD-10-CM

## 2023-10-31 DIAGNOSIS — F1090 Alcohol use, unspecified, uncomplicated: Secondary | ICD-10-CM | POA: Insufficient documentation

## 2023-10-31 DIAGNOSIS — Z87442 Personal history of urinary calculi: Secondary | ICD-10-CM

## 2023-10-31 DIAGNOSIS — E66812 Obesity, class 2: Secondary | ICD-10-CM | POA: Diagnosis present

## 2023-10-31 DIAGNOSIS — I2510 Atherosclerotic heart disease of native coronary artery without angina pectoris: Secondary | ICD-10-CM | POA: Diagnosis present

## 2023-10-31 DIAGNOSIS — Z789 Other specified health status: Secondary | ICD-10-CM | POA: Insufficient documentation

## 2023-10-31 LAB — CBC
HCT: 31.4 % — ABNORMAL LOW (ref 39.0–52.0)
HCT: 30 % — ABNORMAL LOW (ref 39.0–52.0)
HCT: 29 % — ABNORMAL LOW (ref 39.0–52.0)
Hemoglobin: 9.8 g/dL — ABNORMAL LOW (ref 13.0–17.0)
Hemoglobin: 9.3 g/dL — ABNORMAL LOW (ref 13.0–17.0)
Hemoglobin: 9.1 g/dL — ABNORMAL LOW (ref 13.0–17.0)
MCH: 25.8 pg — ABNORMAL LOW (ref 26.0–34.0)
MCH: 25.8 pg — ABNORMAL LOW (ref 26.0–34.0)
MCH: 25.4 pg — ABNORMAL LOW (ref 26.0–34.0)
MCHC: 31.2 g/dL (ref 30.0–36.0)
MCHC: 31 g/dL (ref 30.0–36.0)
MCHC: 31.4 g/dL (ref 30.0–36.0)
MCV: 82.6 fL (ref 80.0–100.0)
MCV: 83.3 fL (ref 80.0–100.0)
MCV: 81 fL (ref 80.0–100.0)
Platelets: 287 10*3/uL (ref 150–400)
Platelets: 284 10*3/uL (ref 150–400)
Platelets: 247 10*3/uL (ref 150–400)
RBC: 3.8 MIL/uL — ABNORMAL LOW (ref 4.22–5.81)
RBC: 3.6 MIL/uL — ABNORMAL LOW (ref 4.22–5.81)
RBC: 3.58 MIL/uL — ABNORMAL LOW (ref 4.22–5.81)
RDW: 18.6 % — ABNORMAL HIGH (ref 11.5–15.5)
RDW: 18.6 % — ABNORMAL HIGH (ref 11.5–15.5)
RDW: 18.6 % — ABNORMAL HIGH (ref 11.5–15.5)
WBC: 7 10*3/uL (ref 4.0–10.5)
WBC: 5.3 10*3/uL (ref 4.0–10.5)
WBC: 5.4 10*3/uL (ref 4.0–10.5)
nRBC: 0 % (ref 0.0–0.2)
nRBC: 0 % (ref 0.0–0.2)
nRBC: 0 % (ref 0.0–0.2)

## 2023-10-31 LAB — URINALYSIS, ROUTINE W REFLEX MICROSCOPIC
Bacteria, UA: NONE SEEN
Bilirubin Urine: NEGATIVE
Glucose, UA: NEGATIVE mg/dL
Ketones, ur: NEGATIVE mg/dL
Leukocytes,Ua: NEGATIVE
Nitrite: NEGATIVE
Protein, ur: NEGATIVE mg/dL
Specific Gravity, Urine: 1.008 (ref 1.005–1.030)
pH: 5 (ref 5.0–8.0)

## 2023-10-31 LAB — PROCALCITONIN: Procalcitonin: <0.1 ng/mL

## 2023-10-31 LAB — RAPID URINE DRUG SCREEN, HOSP PERFORMED
Amphetamines: NOT DETECTED
Barbiturates: NOT DETECTED
Benzodiazepines: POSITIVE — AB
Cocaine: NOT DETECTED
Opiates: POSITIVE — AB
Tetrahydrocannabinol: NOT DETECTED

## 2023-10-31 LAB — COMPREHENSIVE METABOLIC PANEL
ALT: 25 U/L (ref 0–44)
AST: 21 U/L (ref 15–41)
Albumin: 3.1 g/dL — ABNORMAL LOW (ref 3.5–5.0)
Alkaline Phosphatase: 87 U/L (ref 38–126)
Anion gap: 10 (ref 5–15)
BUN: 27 mg/dL — ABNORMAL HIGH (ref 8–23)
CO2: 25 mmol/L (ref 22–32)
Calcium: 8.8 mg/dL — ABNORMAL LOW (ref 8.9–10.3)
Chloride: 103 mmol/L (ref 98–111)
Creatinine, Ser: 1.64 mg/dL — ABNORMAL HIGH (ref 0.61–1.24)
GFR, Estimated: 47 mL/min — ABNORMAL LOW (ref >60)
Glucose, Bld: 191 mg/dL — ABNORMAL HIGH (ref 70–99)
Potassium: 3.4 mmol/L — ABNORMAL LOW (ref 3.5–5.1)
Sodium: 138 mmol/L (ref 135–145)
Total Bilirubin: 0.3 mg/dL (ref <1.2)
Total Protein: 6.6 g/dL (ref 6.5–8.1)

## 2023-10-31 LAB — TYPE AND SCREEN
ABO/RH(D): O POS
Antibody Screen: NEGATIVE

## 2023-10-31 LAB — POC OCCULT BLOOD, ED: Fecal Occult Bld: POSITIVE — AB

## 2023-10-31 LAB — GLUCOSE, CAPILLARY: Glucose-Capillary: 286 mg/dL — ABNORMAL HIGH (ref 70–99)

## 2023-10-31 MED ORDER — ONDANSETRON HCL 4 MG PO TABS: 4.0000 mg | ORAL_TABLET | Freq: Four times a day (QID) | ORAL | Status: DC | PRN

## 2023-10-31 MED ORDER — TAMSULOSIN HCL 0.4 MG PO CAPS
0.4000 mg | ORAL_CAPSULE | Freq: Every day | ORAL | Status: DC
  Administered 2023-11-01 – 2023-11-03 (×3): 0.4 mg via ORAL
  Filled 2023-10-31 (×3): qty 1

## 2023-10-31 MED ORDER — IOHEXOL 350 MG/ML SOLN
100.0000 mL | Freq: Once | INTRAVENOUS | Status: AC | PRN
  Administered 2023-10-31: 100 mL via INTRAVENOUS

## 2023-10-31 MED ORDER — SODIUM CHLORIDE 0.9 % IV SOLN
2.0000 g | Freq: Once | INTRAVENOUS | Status: AC
  Administered 2023-10-31: 2 g via INTRAVENOUS
  Filled 2023-10-31: qty 20

## 2023-10-31 MED ORDER — PANTOPRAZOLE SODIUM 40 MG IV SOLR
40.0000 mg | Freq: Two times a day (BID) | INTRAVENOUS | Status: DC
  Administered 2023-10-31 – 2023-11-03 (×6): 40 mg via INTRAVENOUS
  Filled 2023-10-31 (×6): qty 10

## 2023-10-31 MED ORDER — MORPHINE SULFATE (PF) 2 MG/ML IV SOLN
2.0000 mg | INTRAVENOUS | Status: AC | PRN
  Administered 2023-10-31 (×3): 2 mg via INTRAVENOUS
  Filled 2023-10-31 (×3): qty 1

## 2023-10-31 MED ORDER — INSULIN ASPART 100 UNIT/ML IJ SOLN
0.0000 [IU] | Freq: Every day | INTRAMUSCULAR | Status: DC
Start: 2023-10-31 — End: 2023-11-03
  Administered 2023-10-31: 3 [IU] via SUBCUTANEOUS
  Administered 2023-11-01 – 2023-11-02 (×2): 2 [IU] via SUBCUTANEOUS

## 2023-10-31 MED ORDER — FLUTICASONE FUROATE-VILANTEROL 100-25 MCG/ACT IN AEPB
1.0000 | INHALATION_SPRAY | Freq: Every day | RESPIRATORY_TRACT | Status: DC
  Administered 2023-11-01 – 2023-11-03 (×3): 1 via RESPIRATORY_TRACT
  Filled 2023-10-31: qty 28

## 2023-10-31 MED ORDER — INSULIN ASPART 100 UNIT/ML IJ SOLN
0.0000 [IU] | Freq: Three times a day (TID) | INTRAMUSCULAR | Status: DC
Start: 2023-10-31 — End: 2023-11-03
  Administered 2023-11-01: 2 [IU] via SUBCUTANEOUS
  Administered 2023-11-01: 5 [IU] via SUBCUTANEOUS
  Administered 2023-11-02: 2 [IU] via SUBCUTANEOUS
  Administered 2023-11-02 – 2023-11-03 (×2): 3 [IU] via SUBCUTANEOUS

## 2023-10-31 MED ORDER — MORPHINE SULFATE (PF) 4 MG/ML IV SOLN
4.0000 mg | Freq: Once | INTRAVENOUS | Status: AC
  Administered 2023-10-31: 4 mg via INTRAVENOUS
  Filled 2023-10-31: qty 1

## 2023-10-31 MED ORDER — PANTOPRAZOLE SODIUM 40 MG IV SOLR
40.0000 mg | Freq: Once | INTRAVENOUS | Status: AC
  Administered 2023-10-31: 40 mg via INTRAVENOUS
  Filled 2023-10-31: qty 10

## 2023-10-31 MED ORDER — SODIUM CHLORIDE 0.9 % IV SOLN: 1.0000 g | INTRAVENOUS | Status: DC

## 2023-10-31 MED ORDER — UMECLIDINIUM BROMIDE 62.5 MCG/ACT IN AEPB
1.0000 | INHALATION_SPRAY | Freq: Every day | RESPIRATORY_TRACT | Status: DC
Start: 2023-10-31 — End: 2023-11-03
  Administered 2023-11-01 – 2023-11-03 (×3): 1 via RESPIRATORY_TRACT
  Filled 2023-10-31: qty 7

## 2023-10-31 MED ORDER — ONDANSETRON HCL 4 MG/2ML IJ SOLN
4.0000 mg | Freq: Once | INTRAMUSCULAR | Status: AC
  Administered 2023-10-31: 4 mg via INTRAVENOUS
  Filled 2023-10-31: qty 2

## 2023-10-31 MED ORDER — DILTIAZEM HCL ER COATED BEADS 180 MG PO CP24
180.0000 mg | ORAL_CAPSULE | Freq: Every day | ORAL | Status: DC
  Administered 2023-11-01 – 2023-11-03 (×3): 180 mg via ORAL
  Filled 2023-10-31 (×3): qty 1

## 2023-10-31 MED ORDER — POTASSIUM CHLORIDE 10 MEQ/100ML IV SOLN
INTRAVENOUS | Status: AC
  Filled 2023-10-31: qty 100

## 2023-10-31 MED ORDER — METRONIDAZOLE 500 MG/100ML IV SOLN
500.0000 mg | Freq: Once | INTRAVENOUS | Status: AC
  Administered 2023-10-31: 500 mg via INTRAVENOUS
  Filled 2023-10-31: qty 100

## 2023-10-31 MED ORDER — POTASSIUM CHLORIDE 10 MEQ/100ML IV SOLN
10.0000 meq | INTRAVENOUS | Status: AC
  Administered 2023-10-31 (×2): 10 meq via INTRAVENOUS
  Filled 2023-10-31: qty 100

## 2023-10-31 MED ORDER — LORAZEPAM 2 MG/ML IJ SOLN
2.0000 mg | Freq: Once | INTRAMUSCULAR | Status: AC
  Administered 2023-10-31: 2 mg via INTRAVENOUS
  Filled 2023-10-31: qty 1

## 2023-10-31 MED ORDER — FENOFIBRATE 54 MG PO TABS
54.0000 mg | ORAL_TABLET | Freq: Every day | ORAL | Status: DC
  Administered 2023-11-01 – 2023-11-03 (×3): 54 mg via ORAL
  Filled 2023-10-31 (×6): qty 1

## 2023-10-31 MED ORDER — FINASTERIDE 5 MG PO TABS
5.0000 mg | ORAL_TABLET | Freq: Every day | ORAL | Status: DC
  Administered 2023-11-01 – 2023-11-03 (×3): 5 mg via ORAL
  Filled 2023-10-31 (×3): qty 1

## 2023-10-31 MED ORDER — SODIUM CHLORIDE 0.9 % IV SOLN
2.0000 g | INTRAVENOUS | Status: DC
  Administered 2023-11-01 – 2023-11-02 (×2): 2 g via INTRAVENOUS
  Filled 2023-10-31 (×2): qty 20

## 2023-10-31 MED ORDER — ATORVASTATIN CALCIUM 40 MG PO TABS
40.0000 mg | ORAL_TABLET | Freq: Every day | ORAL | Status: DC
  Administered 2023-11-01 – 2023-11-03 (×3): 40 mg via ORAL
  Filled 2023-10-31 (×3): qty 1

## 2023-10-31 MED ORDER — NALTREXONE HCL 50 MG PO TABS
50.0000 mg | ORAL_TABLET | Freq: Every day | ORAL | Status: DC
  Administered 2023-10-31 – 2023-11-03 (×4): 50 mg via ORAL
  Filled 2023-10-31 (×7): qty 1

## 2023-10-31 MED ORDER — ONDANSETRON HCL 4 MG/2ML IJ SOLN
4.0000 mg | Freq: Four times a day (QID) | INTRAMUSCULAR | Status: DC | PRN
  Administered 2023-10-31 – 2023-11-01 (×2): 4 mg via INTRAVENOUS
  Filled 2023-10-31 (×2): qty 2

## 2023-10-31 MED ORDER — OXYCODONE HCL 5 MG PO TABS
5.0000 mg | ORAL_TABLET | ORAL | Status: DC | PRN
  Administered 2023-10-31 – 2023-11-01 (×3): 5 mg via ORAL
  Filled 2023-10-31 (×3): qty 1

## 2023-10-31 NOTE — ED Notes (Signed)
Pt given ice chips

## 2023-10-31 NOTE — Consult Note (Signed)
@LOGO @   Referring Provider: Dr. Glendora Score Primary Care Physician:  Carmel Sacramento, NP Primary Gastroenterologist:  Dr. Marletta Lor  Date of Admission: 10/31/23 Date of Consultation: 10/31/23  Reason for Consultation:  Rectal bleeding  HPI:  Thomas Donovan is a 62 y.o. year old male with a history of afib, DM, CKD, OSA, polysubstance abuse including alcohol and cocaine, cirrhosis, admitted in September 2024 with symptomatic iron deficiency anemia and heme positive stool but no overt GI bleeding receiving 2 units PRBCs with hemoglobin stabilizing in the 8 range.  Patient ultimately needed EGD and colonoscopy, but he was not a candidate at that time due to positive drug screen for cocaine and recommended outpatient follow-up to arrange procedures, but he no-show to his appointment on 10/05/23.  Patient return to the emergency room today with complaint of rectal bleeding starting last night with a total of 5 episodes.  Also reporting pain from his rectum radiating to suprapubic area, right sided flank pain.  In the ED, he was found to have a hemoglobin of 9.8 which was improved from 9.0, 3 days ago.  Fecal occult blood positive.    CT angio abdomen pelvis with and without contrast:  - Hyperenhancement of the left hemorrhoidal cushion with probable contrast extravasation (active bleeding) from the hemorrhoid. There is a direct feeding artery from a hypertrophic left middle rectal artery arising from the anterior division of the left internal iliac artery. If hemostasis cannot be obtained manually, or endoscopically, catheter directed embolization would likely be possible. - Enlarging soft tissue nodule position between the left aspect of the prostate gland and the rectum with local mass effect on the rectum. Differential considerations include prostate cancer versus exophytic rectal malignancy, GIST tumor or potentially a primary peritoneal lesion. This is been described on prior imaging  and gadolinium-enhanced prostate MRI was previously suggested for further evaluation. In addition to that imaging suggestion, correlation with serum PSA and digital rectal examination is also recommended. -  Sigmoid diverticulosis with some chronic stranding and peritoneal thickening. Smoldering diverticulitis is a consideration. - Questionable hepatic cirrhosis. No ascites on today's examination.   Consult:  Has chronic intermittent rectal bleeding for the last 10+ years. Reports blood will be all in the toilet water. Reports he has had 8-9 blood transfusions over the last 14 months.  Last night, he developed rectal bleeding which he would not have thought anything about, but he also developed lower abdominal cramping, watery diarrhea with about 4 total episodes, and pain radiating from his rectum to the suprapubic area.  Last episode of rectal bleeding was around 330 this morning.  He continues to have abdominal discomfort.  No nausea or vomiting.   States he has prostate cancer. States he was diagnosed in [place]. Hasn't seen oncology.   Having abdominal and rectal pain with urination. Also having a lot of urinary urgency. (UA negative today)  ETOH: Last drank about 12 days ago.  Drug use: Last drug use was before 12 days ago.   NSAIDs:  Ibuprofen 800 mg daily for the last 3 days.   Reports ER doc did rectal exam and it, "about killed him".  Does not want me to do another rectal exam.  Eliquis: Last dose this morning.   Last colonoscopy: about 10 years ago by Dr Gabriel Cirri. States he had 2 polyps.  Last EGD: none prior.    Cirrhosis: Confusion: None.  LE edema: None.    Past Medical History:  Diagnosis Date   Aortic aneurysm (HCC)  2019   Chronic back pain    Cold    recent rx   Coronary artery disease    Degeneration of lumbar intervertebral disc    Diabetes mellitus    GERD (gastroesophageal reflux disease)    Gout    History of kidney stones    Hypertension     Pneumonia     Past Surgical History:  Procedure Laterality Date   BACK SURGERY     IRRIGATION AND DEBRIDEMENT ABSCESS N/A 07/15/2019   Procedure: IRRIGATION AND DRAINAGE OF PERINEAL ABSCESS;  Surgeon: Franky Macho, MD;  Location: [facility];  Service: General;  Laterality: N/A;   MAXIMUM ACCESS (MAS)POSTERIOR LUMBAR INTERBODY FUSION (PLIF) 1 LEVEL  11/20/2013   Procedure: FOR MAXIMUM ACCESS (MAS) POSTERIOR LUMBAR INTERBODY FUSION LUMBAR FIVE-SACRAL ONE;  Surgeon: Tia Alert, MD;  Location: [facility];  Service: Neurosurgery;;  FOR MAXIMUM ACCESS (MAS) POSTERIOR LUMBAR INTERBODY FUSION LUMBAR FIVE-SACRAL ONE   TONSILLECTOMY      Prior to Admission medications   Medication Sig Start Date End Date Taking? Authorizing Provider  albuterol (VENTOLIN HFA) 108 (90 Base) MCG/ACT inhaler Inhale 2 puffs into the lungs every 6 (six) hours as needed for wheezing or shortness of breath. 10/28/23   Sherryll Burger, Pratik D, DO  atorvastatin (LIPITOR) 40 MG tablet Take 1 tablet (40 mg total) by mouth daily. 10/28/23 11/27/23  Sherryll Burger, Pratik D, DO  colchicine 0.6 MG tablet Take 1-2 tablets (0.6-1.2 mg total) by mouth 2 (two) times daily as needed (for gout flares). 10/28/23   Sherryll Burger, Pratik D, DO  diclofenac Sodium (VOLTAREN) 1 % GEL Apply 2 g topically 4 (four) times daily as needed (back pain). 10/28/23   Sherryll Burger, Pratik D, DO  diltiazem (CARDIZEM CD) 180 MG 24 hr capsule Take 1 capsule (180 mg total) by mouth daily. 10/28/23 10/27/24  Sherryll Burger, Pratik D, DO  ELIQUIS 5 MG TABS tablet Take 1 tablet (5 mg total) by mouth 2 (two) times daily. 10/28/23   Sherryll Burger, Pratik D, DO  esomeprazole (NEXIUM) 40 MG capsule Take 1 capsule (40 mg total) by mouth daily. 10/28/23   Sherryll Burger, Pratik D, DO  fenofibrate micronized (LOFIBRA) 134 MG capsule Take 1 capsule (134 mg total) by mouth daily. 10/28/23   Sherryll Burger, Pratik D, DO  finasteride (PROSCAR) 5 MG tablet Take 1 tablet (5 mg total) by mouth daily. 10/28/23 10/27/24  Sherryll Burger, Pratik D, DO  furosemide (LASIX) 40  MG tablet Take 1 tablet (40 mg total) by mouth daily. 10/28/23   Sherryll Burger, Pratik D, DO  glipiZIDE (GLUCOTROL) 10 MG tablet Take 2 tablets (20 mg total) by mouth daily. 10/28/23   Sherryll Burger, Pratik D, DO  metFORMIN (GLUCOPHAGE) 850 MG tablet Take 1 tablet (850 mg total) by mouth every morning. 10/28/23 11/27/23  Sherryll Burger, Pratik D, DO  RYBELSUS 14 MG TABS Take 1 tablet by mouth daily. 08/04/23   [provider]  tamsulosin (FLOMAX) 0.4 MG CAPS capsule Take 1 capsule (0.4 mg total) by mouth daily. 10/28/23   Sherryll Burger, Pratik D, DO  TRELEGY ELLIPTA 100-62.5-25 MCG/ACT AEPB Inhale 1 puff into the lungs daily. 10/28/23   Maurilio Lovely D, DO    Current Facility-Administered Medications  Medication Dose Route Frequency Provider Last Rate Last Admin   morphine (PF) 4 MG/ML injection 4 mg  4 mg Intravenous Once Kommor, Madison, MD       Current Outpatient Medications  Medication Sig Dispense Refill   albuterol (VENTOLIN HFA) 108 (90 Base) MCG/ACT inhaler Inhale 2  puffs into the lungs every 6 (six) hours as needed for wheezing or shortness of breath. 8 g 0   atorvastatin (LIPITOR) 40 MG tablet Take 1 tablet (40 mg total) by mouth daily. 30 tablet 1   colchicine 0.6 MG tablet Take 1-2 tablets (0.6-1.2 mg total) by mouth 2 (two) times daily as needed (for gout flares). 20 tablet 0   diclofenac Sodium (VOLTAREN) 1 % GEL Apply 2 g topically 4 (four) times daily as needed (back pain). 50 g 0   diltiazem (CARDIZEM CD) 180 MG 24 hr capsule Take 1 capsule (180 mg total) by mouth daily. 30 capsule 1   ELIQUIS 5 MG TABS tablet Take 1 tablet (5 mg total) by mouth 2 (two) times daily. 60 tablet 2   esomeprazole (NEXIUM) 40 MG capsule Take 1 capsule (40 mg total) by mouth daily. 30 capsule 1   fenofibrate micronized (LOFIBRA) 134 MG capsule Take 1 capsule (134 mg total) by mouth daily. 30 capsule 1   finasteride (PROSCAR) 5 MG tablet Take 1 tablet (5 mg total) by mouth daily. 30 tablet 11   furosemide (LASIX) 40 MG tablet Take 1  tablet (40 mg total) by mouth daily. 30 tablet 1   glipiZIDE (GLUCOTROL) 10 MG tablet Take 2 tablets (20 mg total) by mouth daily. 30 tablet 1   metFORMIN (GLUCOPHAGE) 850 MG tablet Take 1 tablet (850 mg total) by mouth every morning. 30 tablet 0   RYBELSUS 14 MG TABS Take 1 tablet by mouth daily.     tamsulosin (FLOMAX) 0.4 MG CAPS capsule Take 1 capsule (0.4 mg total) by mouth daily. 30 capsule 1   TRELEGY ELLIPTA 100-62.5-25 MCG/ACT AEPB Inhale 1 puff into the lungs daily. 28 each 1    Allergies as of 10/31/2023 - Review Complete 10/31/2023  Allergen Reaction Noted   Celebrex [celecoxib] Anaphylaxis 02/07/2021   Guaifenesin Anaphylaxis 09/03/2021   Ibuprofen Other (See Comments) and Hives 06/21/2015   Tramadol Hives and Rash 07/03/2012   Acetaminophen  12/08/2015   Aspirin Other (See Comments) 07/01/2015   Ketorolac  07/01/2015   Toradol [ketorolac tromethamine] Other (See Comments) 11/17/2015    Family History  Problem Relation Age of Onset   Colon cancer Neg Hx     Social History   Socioeconomic History   Marital status: Married    Spouse name: Not on file   Number of children: Not on file   Years of education: Not on file   Highest education level: Not on file  Occupational History   Not on file  Tobacco Use   Smoking status: Former    Current packs/day: 0.50    Average packs/day: 0.5 packs/day for 10.0 years (5.0 ttl pk-yrs)    Types: Cigarettes   Smokeless tobacco: Never  Vaping Use   Vaping status: Never Used  Substance and Sexual Activity   Alcohol use: Yes    Alcohol/week: 12.0 standard drinks of alcohol    Types: 12 Cans of beer per week    Comment: every 2 or 3 days   Drug use: No   Sexual activity: Yes  Other Topics Concern   Not on file  Social History Narrative   Not on file   Social Determinants of Health   Financial Resource Strain: Medium Risk (01/05/2023)   Received from [facility], [facility]   Overall Financial Resource Strain  (CARDIA)    Difficulty of Paying Living Expenses: Somewhat hard  Food Insecurity: No Food Insecurity (10/27/2023)  Hunger Vital Sign    Worried About Running Out of Food in the Last Year: Never true    Ran Out of Food in the Last Year: Never true  Transportation Needs: Patient Declined (10/27/2023)   PRAPARE - Administrator, Civil Service (Medical): Patient declined    Lack of Transportation (Non-Medical): Patient declined  Physical Activity: Inactive (10/04/2023)   Received from [facility]   Exercise Vital Sign    Days of Exercise per Week: 0 days    Minutes of Exercise per Session: 0 min  Stress: Stress Concern Present (10/04/2023)   Received from [facility] - Occupational Stress Questionnaire    Feeling of Stress : Very much  Social Connections: Moderately Integrated (10/04/2023)   [facility] from [facility]   Social Connection and Isolation Panel [NHANES]    Frequency of Communication with Friends and Family: Once a week    Frequency of Social Gatherings with Friends and Family: Once a week    Attends Religious Services: 1 to 4 times per year    Active Member of [employer] or Organizations: No    Attends Engineer, structural: 1 to 4 times per year    Marital Status: Married  Catering manager Violence: Not At Risk (10/27/2023)   Humiliation, Afraid, Rape, and Kick questionnaire    Fear of Current or Ex-Partner: No    Emotionally Abused: No    Physically Abused: No    Sexually Abused: No    Review of Systems: Gen: Denies fever, chills, cold or flag symptoms, presyncope, syncope. CV: Denies chest pain, heart palpitations. Resp: Denies shortness of breath, cough. GI: See HPI GU : See HPI MS: Denies joint pain. Derm: Denies rash. Heme: See HPI  Physical Exam: Vital signs in last 24 hours: Temp:  [97.9 F (36.6 C)] 97.9 F (36.6 C) (11/05 1135) Pulse Rate:  [102-108] 102 (11/05 1400) Resp:  [19] 19  (11/05 1400) BP: (147)/(88) 147/88 (11/05 1130) SpO2:  [98 %-100 %] 100 % (11/05 1143) Weight:  [120.7 kg] 120.7 kg (11/05 1049)   General:   Alert,  Well-developed, well-nourished, pleasant and cooperative in NAD Head:  Normocephalic and atraumatic. Eyes:  Sclera clear, no icterus.   Conjunctiva pink. Ears:  Normal auditory acuity. Lungs:  Clear throughout to auscultation.   No wheezes, crackles, or rhonchi. No acute distress. Heart:  Regular rate and rhythm; no murmurs, clicks, rubs,  or gallops. Abdomen:  Soft, and nondistended.  Mild TTP across lower abdomen, primarily LLQ.  No masses, hepatosplenomegaly or hernias noted. Normal bowel sounds, without guarding, and without rebound.   Rectal: Patient declined. Msk:  Symmetrical without gross deformities. Normal posture. Extremities:  Without edema. Neurologic:  Alert and  oriented x4;  grossly normal neurologically. Skin:  Intact without significant lesions or rashes. Cervical Nodes:  No significant cervical adenopathy. Psych: Normal mood and affect.  Intake/Output from previous day: No intake/output data recorded. Intake/Output this shift: No intake/output data recorded.  Lab Results: Recent Labs    10/31/23 1108  WBC 7.0  HGB 9.8*  HCT 31.4*  PLT 287   BMET Recent Labs    10/31/23 1108  NA 138  K 3.4*  CL 103  CO2 25  GLUCOSE 191*  BUN 27*  CREATININE 1.64*  CALCIUM 8.8*   LFT Recent Labs    10/31/23 1108  PROT 6.6  ALBUMIN 3.1*  AST 21  ALT 25  ALKPHOS  87  BILITOT 0.3     Impression: 62 year old male with history of atrial fibrillation on Eliquis, DM, CKD, OSA, polysubstance abuse including alcohol and cocaine, cirrhosis, chronic anemia receiving multiple blood transfusions over this last year, admitted in September 2024 with symptomatic iron deficiency anemia and heme positive stool but no overt GI bleeding receiving 2 units PRBCs with hemoglobin stabilizing in the 8 range with recommendations for  outpatient EGD and colonoscopy due to UDS cocaine positive, but this was never completed as patient no-showed to his follow-up, now returning to the emergency room with abdominal pain and rectal bleeding.   Rectal bleeding: -CT angio abdomen pelvis performed today suggesting bleeding from left hemorrhoidal cushion.  States there is a direct feeding artery from the hypertrophic left middle renal artery arising from the anterior division of the left internal iliac artery.  States if hemostasis cannot be obtained manually or endoscopically, catheter directed embolization would likely be possible. -Encouragingly, patient's rectal bleeding has tapered off, last episode around 330 this morning. -Hemoglobin is actually better today at 9.8 compared to 3 days ago, 9.0.  - As last dose of eliquis was this morning, will plan for flex sig on Thursday unless he were to develop significant recurrent GI bleeding.  As there is some concern for diverticulitis on CT as well, we will defer complete colonoscopy to the outpatient setting in 6-8 weeks. - UDS prior to flex sig due to history of drug use.   Lower abdominal pain/rectal pain/possible smoldering diverticulitis/soft tissue nodule between prostate gland and rectum: -New rectal and low abdominal pain onset late last night. -This could be multifactorial.  CT angio A/P today suggesting smoldering diverticulitis which could certainly explain his new onset abdominal pain.  However, he also has an enlarging soft tissue nodule between left aspect of prostate gland and the rectum with local mass effect on the rectum with differentials including prostate cancer versus exophytic rectal malignancy, GIST tumor, or potentially primary peritoneal lesion.  It has been recommended that he have gained ileum enhanced prostate MRI as well as serum PSA. - Will defer ordering of MRI and serum PSA to hospitalist. - He has been started on rocephin and flagyl for diverticulitis per  hospitalist. - Plans for flex sig this admission in light of rectal/hemorrhoidal bleeding.   - Will need a complete colonoscopy in the outpatient setting in 6-8 weeks.   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  Overall stable at this time. Will check INR in the morning in light of rectal bleeding to ensure this isn't significantly elevated.  He will need outpatient follow-up for complete workup including serologic evaluation as well as EGD for variceal screening.  No real need for antibiotics specifically for SBP prophylaxis as he was experiencing lower GI bleeding.   Plan: UDS INR tomorrow morning with routine labs. Monitor for recurrent rectal bleeding. Monitor H&H. Clear liquid diet. Continue to hold Eliquis. Plan for flex sigmoidoscopy Thursday to allow for Eliquis washout unless patient were to develop recurrent significant GI bleeding. OK to continue Rocephin and Flagyl for diverticulitis.  Would be okay to transition to p.o. Cipro and Flagyl. Will need to complete a full 10-day course of antibiotics. Will need complete colonoscopy as well as EGD in the outpatient setting. Needs outpatient cirrhosis care.  Defer ordering of PSA and prostate MRI to hospitalist.    LOS: 0 days    10/31/2023, 2:10 PM   Ermalinda Memos, [facility]

## 2023-10-31 NOTE — ED Notes (Signed)
Patient transported to CT 

## 2023-10-31 NOTE — Assessment & Plan Note (Signed)
Continue protonix BID 

## 2023-10-31 NOTE — Assessment & Plan Note (Signed)
-  hold metformin and glipizide -Sliding scale coverage

## 2023-10-31 NOTE — ED Notes (Signed)
POC occult positive--MD aware

## 2023-10-31 NOTE — Assessment & Plan Note (Signed)
-  Hgb has been as low as 6.6 in 08/2023 -Today Hgb 9.8 -FOBT positive -GI plan for flex sig in AM

## 2023-10-31 NOTE — ED Provider Notes (Signed)
[place] EMERGENCY DEPARTMENT AT [facility] Provider Note  CSN: [id number] Arrival date & time: 10/31/23 9629  Chief Complaint(s) Rectal Bleeding  HPI Thomas Donovan is a 62 y.o. male with PMH A-fib on Eliquis, T2DM, gout, CKD 3, polysubstance abuse, alcohol abuse, obesity who presents emergency department for evaluation of rectal bleeding and pelvic pain.  Patient states that he has had 5 bright red bloody stools over the last 24 hours with associated diarrhea and rectal pain that radiates up into the suprapubic area.  Also endorsing right flank pain.  Denies chest pain, shortness of breath, headache, fever or other systemic symptoms.   Past Medical History Past Medical History:  Diagnosis Date   Aortic aneurysm (HCC) 2019   Chronic back pain    Cold    recent rx   Coronary artery disease    Degeneration of lumbar intervertebral disc    Diabetes mellitus    GERD (gastroesophageal reflux disease)    Gout    History of kidney stones    Hypertension    Pneumonia    Patient Active Problem List   Diagnosis Date Noted   Elevated d-dimer 10/27/2023   Paroxysmal atrial fibrillation with RVR (HCC) 10/26/2023   Acute kidney injury superimposed on CKD (HCC) 10/26/2023   Hypoalbuminemia 10/26/2023   Gout 10/26/2023   Chronic heart failure with preserved ejection fraction (HFpEF) (HCC) 10/26/2023   Drug abuse (HCC) 10/26/2023   Obesity (BMI 30-39.9) 10/26/2023   Thrombocytosis 10/26/2023   OSA on CPAP 10/26/2023   Chronic back pain 10/26/2023   Chronic anticoagulation 09/21/2023   Guaiac positive stools 09/20/2023   Acute drug-induced gout of left foot 09/20/2023   Symptomatic anemia 09/18/2023   Acute on chronic blood loss anemia 09/18/2023   GI bleed 09/18/2023   Alcohol use disorder 09/18/2023   Cocaine abuse (HCC) 09/18/2023   Chronic kidney disease, stage 3a (HCC) 09/18/2023   BPH (benign prostatic hyperplasia) 09/18/2023   Hematuria 09/18/2023   Atrial  fibrillation, chronic (HCC) 09/18/2023   Abscess of perineum    Tobacco use 08/22/2017   Hypokalemia 08/22/2017   GERD without esophagitis 08/22/2017   Uncontrolled type 2 diabetes mellitus with hyperglycemia, without long-term current use of insulin (HCC) 08/22/2017   Syncope 08/21/2017   Atypical chest pain 08/21/2017   Dyspnea 08/21/2017   Hematochezia 08/21/2017   S/P lumbar spinal fusion 11/20/2013   Home Medication(s) Prior to Admission medications   Medication Sig Start Date End Date Taking? Authorizing Provider  albuterol (VENTOLIN HFA) 108 (90 Base) MCG/ACT inhaler Inhale 2 puffs into the lungs every 6 (six) hours as needed for wheezing or shortness of breath. 10/28/23   Sherryll Burger, Pratik D, DO  atorvastatin (LIPITOR) 40 MG tablet Take 1 tablet (40 mg total) by mouth daily. 10/28/23 11/27/23  Sherryll Burger, Pratik D, DO  colchicine 0.6 MG tablet Take 1-2 tablets (0.6-1.2 mg total) by mouth 2 (two) times daily as needed (for gout flares). 10/28/23   Sherryll Burger, Pratik D, DO  diclofenac Sodium (VOLTAREN) 1 % GEL Apply 2 g topically 4 (four) times daily as needed (back pain). 10/28/23   Sherryll Burger, Pratik D, DO  diltiazem (CARDIZEM CD) 180 MG 24 hr capsule Take 1 capsule (180 mg total) by mouth daily. 10/28/23 10/27/24  Sherryll Burger, Pratik D, DO  ELIQUIS 5 MG TABS tablet Take 1 tablet (5 mg total) by mouth 2 (two) times daily. 10/28/23   Sherryll Burger, Pratik D, DO  esomeprazole (NEXIUM) 40 MG capsule Take 1 capsule (40  mg total) by mouth daily. 10/28/23   Sherryll Burger, Pratik D, DO  fenofibrate micronized (LOFIBRA) 134 MG capsule Take 1 capsule (134 mg total) by mouth daily. 10/28/23   Sherryll Burger, Pratik D, DO  finasteride (PROSCAR) 5 MG tablet Take 1 tablet (5 mg total) by mouth daily. 10/28/23 10/27/24  Sherryll Burger, Pratik D, DO  furosemide (LASIX) 40 MG tablet Take 1 tablet (40 mg total) by mouth daily. 10/28/23   Sherryll Burger, Pratik D, DO  glipiZIDE (GLUCOTROL) 10 MG tablet Take 2 tablets (20 mg total) by mouth daily. 10/28/23   Sherryll Burger, Pratik D, DO  metFORMIN  (GLUCOPHAGE) 850 MG tablet Take 1 tablet (850 mg total) by mouth every morning. 10/28/23 11/27/23  Sherryll Burger, Pratik D, DO  RYBELSUS 14 MG TABS Take 1 tablet by mouth daily. 08/04/23   [provider]  tamsulosin (FLOMAX) 0.4 MG CAPS capsule Take 1 capsule (0.4 mg total) by mouth daily. 10/28/23   Sherryll Burger, Pratik D, DO  TRELEGY ELLIPTA 100-62.5-25 MCG/ACT AEPB Inhale 1 puff into the lungs daily. 10/28/23   Maurilio Lovely D, DO                                                                                                                                    Past Surgical History Past Surgical History:  Procedure Laterality Date   BACK SURGERY     IRRIGATION AND DEBRIDEMENT ABSCESS N/A 07/15/2019   Procedure: IRRIGATION AND DRAINAGE OF PERINEAL ABSCESS;  Surgeon: Franky Macho, MD;  Location: [facility];  Service: General;  Laterality: N/A;   MAXIMUM ACCESS (MAS)POSTERIOR LUMBAR INTERBODY FUSION (PLIF) 1 LEVEL  11/20/2013   Procedure: FOR MAXIMUM ACCESS (MAS) POSTERIOR LUMBAR INTERBODY FUSION LUMBAR FIVE-SACRAL ONE;  Surgeon: Tia Alert, MD;  Location: [facility];  Service: Neurosurgery;;  FOR MAXIMUM ACCESS (MAS) POSTERIOR LUMBAR INTERBODY FUSION LUMBAR FIVE-SACRAL ONE   TONSILLECTOMY     Family History Family History  Problem Relation Age of Onset   Colon cancer Neg Hx     Social History Social History   Tobacco Use   Smoking status: Former    Current packs/day: 0.50    Average packs/day: 0.5 packs/day for 10.0 years (5.0 ttl pk-yrs)    Types: Cigarettes   Smokeless tobacco: Never  Vaping Use   Vaping status: Never Used  Substance Use Topics   Alcohol use: Yes    Alcohol/week: 12.0 standard drinks of alcohol    Types: 12 Cans of beer per week    Comment: every 2 or 3 days   Drug use: No   Allergies Celebrex [celecoxib], Guaifenesin, Ibuprofen, Tramadol, Acetaminophen, Aspirin, Ketorolac, and Toradol [ketorolac tromethamine]  Review of Systems Review of Systems  Gastrointestinal:   Positive for abdominal pain and blood in stool.    Physical Exam Vital Signs  I have reviewed the triage vital signs BP (!) 147/88   Pulse (!) 102   Temp 97.9 F (36.6  C) (Oral)   Resp 19   Ht 6' (1.829 m)   Wt 120.7 kg   SpO2 100%   BMI 36.08 kg/m   Physical Exam Constitutional:      General: He is not in acute distress.    Appearance: Normal appearance.  HENT:     Head: Normocephalic and atraumatic.     Nose: No congestion or rhinorrhea.  Eyes:     General:        Right eye: No discharge.        Left eye: No discharge.     Extraocular Movements: Extraocular movements intact.     Pupils: Pupils are equal, round, and reactive to light.  Cardiovascular:     Rate and Rhythm: Normal rate and regular rhythm.     Heart sounds: No murmur heard. Pulmonary:     Effort: No respiratory distress.     Breath sounds: No wheezing or rales.  Abdominal:     General: There is no distension.     Tenderness: There is abdominal tenderness.  Genitourinary:    Rectum: Guaiac result positive.  Musculoskeletal:        General: Normal range of motion.     Cervical back: Normal range of motion.  Skin:    General: Skin is warm and dry.  Neurological:     General: No focal deficit present.     Mental Status: He is alert.     ED Results and Treatments Labs (all labs ordered are listed, but only abnormal results are displayed) Labs Reviewed  COMPREHENSIVE METABOLIC PANEL - Abnormal; Notable for the following components:      Result Value   Potassium 3.4 (*)    Glucose, Bld 191 (*)    BUN 27 (*)    Creatinine, Ser 1.64 (*)    Calcium 8.8 (*)    Albumin 3.1 (*)    GFR, Estimated 47 (*)    All other components within normal limits  CBC - Abnormal; Notable for the following components:   RBC 3.80 (*)    Hemoglobin 9.8 (*)    HCT 31.4 (*)    MCH 25.8 (*)    RDW 18.6 (*)    All other components within normal limits  URINALYSIS, ROUTINE W REFLEX MICROSCOPIC - Abnormal; Notable for  the following components:   Color, Urine STRAW (*)    Hgb urine dipstick SMALL (*)    All other components within normal limits  POC OCCULT BLOOD, ED - Abnormal; Notable for the following components:   Fecal Occult Bld POSITIVE (*)    All other components within normal limits  TYPE AND SCREEN                                                                                                                          Radiology CT ANGIO ABDOMEN PELVIS  W & WO CONTRAST  Result Date: 10/31/2023 CLINICAL DATA:  Lower GI bleeding EXAM: CTA ABDOMEN AND PELVIS  WITHOUT AND WITH CONTRAST TECHNIQUE: Multidetector CT imaging of the abdomen and pelvis was performed using the standard protocol during bolus administration of intravenous contrast. Multiplanar reconstructed images and MIPs were obtained and reviewed to evaluate the vascular anatomy. RADIATION DOSE REDUCTION: This exam was performed according to the departmental dose-optimization program which includes automated exposure control, adjustment of the mA and/or kV according to patient size and/or use of iterative reconstruction technique. CONTRAST:  OMNIPAQUE IOHEXOL 350 MG/ML SOLN COMPARISON:  CT scan of the abdomen and pelvis 10/03/2023; 05/18/2023 FINDINGS: VASCULAR Aorta: Normal caliber aorta without aneurysm, dissection, vasculitis or significant stenosis. Celiac: Patent without evidence of aneurysm, dissection, vasculitis or significant stenosis. SMA: The common hepatic artery is replaced to the SMA. No aneurysm, stenosis or dissection. Renals: Both renal arteries are patent without evidence of aneurysm, dissection, vasculitis, fibromuscular dysplasia or significant stenosis. IMA: Patent without evidence of aneurysm, dissection, vasculitis or significant stenosis. Inflow: Patent without evidence of aneurysm, dissection, vasculitis or significant stenosis. Slightly hypertrophic left middle rectal artery providing arterial hyperenhancement to the left  hemorrhoidal cushion. Proximal Outflow: Bilateral common femoral and visualized portions of the superficial and profunda femoral arteries are patent without evidence of aneurysm, dissection, vasculitis or significant stenosis. Veins: No obvious venous abnormality within the limitations of this arterial phase study. Review of the MIP images confirms the above findings. NON-VASCULAR Lower chest: Linear atelectasis versus scarring in the visualized dependent lower lobes. The heart is normal in size. No pericardial effusion. No acute abnormality. Hepatobiliary: Mildly nodular contour of the liver. Question early cirrhotic change. No discrete hepatic lesion. Small calcified gallstone in the gallbladder lumen. No gallbladder distension, wall thickening or Peri cholecystic inflammation. Pancreas: Unremarkable. No pancreatic ductal dilatation or surrounding inflammatory changes. Spleen: Normal in size without focal abnormality. Adrenals/Urinary Tract: Normal adrenal glands. Stable punctate stone in the lower pole collecting system of the right kidney. No hydronephrosis, or enhancing renal lesion. Circumscribed water attenuation cystic lesions present bilaterally consistent with simple cysts. No imaging follow-up is recommended. The ureters and bladder are unremarkable. Stomach/Bowel: Stomach and duodenum are unremarkable. Normal appendix. Sigmoid colonic diverticulosis. There is a small amount of inflammatory stranding in the pericolonic fat with thickening of the retroperitoneum posteriorly. Similar findings have been seen across several prior studies and may be chronic in nature. Slight asymmetric wall thickening of the left rectum and recto anal junction. There is subtle extravasation of contrast material which appears to be emanating from the left hemorrhoidal cushion. Lymphatic: No suspicious lymphadenopathy. Reproductive: Enlarging ovoid soft tissue nodule posterior to the left prostate gland exerting local mass  effect on the adjacent rectum. The abnormality measures approximately 2.6 cm today compared to 2.4 cm in July. Other: No significant abdominal ascites.  No abdominal wall hernia. Musculoskeletal: Surgical changes of prior L5-S1 posterior lumbosacral interbody fusion with interbody graft. Multilevel degenerative disc disease and facet arthropathy. IMPRESSION: VASCULAR 1. Positive for hyperenhancement of the left hemorrhoidal cushion with probable contrast extravasation (active bleeding) from the hemorrhoid. There is a direct feeding artery from a hypertrophic left middle rectal artery arising from the anterior division of the left internal iliac artery. If hemostasis cannot be obtained manually, or endoscopically, catheter directed embolization would likely be possible. 2. No evidence of arterial aneurysm, dissection or significant atherosclerotic plaque. NON-VASCULAR 1. Enlarging soft tissue nodule position between the left aspect of the prostate gland and the rectum with local mass effect on the rectum. Differential considerations include prostate cancer versus exophytic rectal malignancy, GIST tumor or  potentially a primary peritoneal lesion. This is been described on prior imaging and gadolinium-enhanced prostate MRI was previously suggested for further evaluation. In addition to that imaging suggestion, correlation with serum PSA and digital rectal examination is also recommended. 2. Sigmoid diverticulosis with some chronic stranding and peritoneal thickening. Smoldering diverticulitis is a consideration. 3. Questionable hepatic cirrhosis. No ascites on today's examination. 4. Additional ancillary findings as above without significant interval change. Electronically Signed   By: Malachy Moan M.D.   On: 10/31/2023 14:37    Pertinent labs & imaging results that were available during my care of the patient were reviewed by me and considered in my medical decision making (see MDM for details).  Medications  Ordered in ED Medications  cefTRIAXone (ROCEPHIN) 2 g in sodium chloride 0.9 % 100 mL IVPB (has no administration in time range)  metroNIDAZOLE (FLAGYL) IVPB 500 mg (has no administration in time range)  morphine (PF) 4 MG/ML injection 4 mg (4 mg Intravenous Given 10/31/23 1153)  ondansetron (ZOFRAN) injection 4 mg (4 mg Intravenous Given 10/31/23 1154)  pantoprazole (PROTONIX) injection 40 mg (40 mg Intravenous Given 10/31/23 1154)  morphine (PF) 4 MG/ML injection 4 mg (4 mg Intravenous Given 10/31/23 1411)  iohexol (OMNIPAQUE) 350 MG/ML injection 100 mL (100 mLs Intravenous Contrast Given 10/31/23 1331)                                                                                                                                     Procedures Procedures  (including critical care time)  Medical Decision Making / ED Course   This patient presents to the ED for concern of abdominal pain, rectal bleeding, this involves an extensive number of treatment options, and is a complaint that carries with it a high risk of complications and morbidity.  The differential diagnosis includes upper GI bleed including PUD, varices, boerhaave's, Mallory Weiss tear, aortoenteric fistula versus lower GI bleed including mass, diverticulosis/diverticulitis, hemorrhoids, anal fissure, mesenteric ischemia, aortoenteric fistula, colitis  MDM: Patient seen emergency room for evaluation of abdominal pain and rectal bleeding.  Rectal exam without gross blood but does show some asymmetry at the 4 o'clock position.  Laboratory evaluation with a stable hemoglobin at 9.8, potassium 3.4, BUN 27, creatinine 1.64, fecal occult positive.  Urinalysis unremarkable.  CT angio abdomen pelvis GI bleed study is positive for hyperenhancement in the last hemorrhoidal cushion with probable contrast extra from the hemorrhoid, as well as an enlarging soft tissue nodule between the left aspect of the prostate gland in the rectum with local mass  effect.  Spoke with gastroenterology team who is recommending hospital admission for flex sig tomorrow.  Patient admitted.   Additional history obtained:  -External records from outside source obtained and reviewed including: Chart review including previous notes, labs, imaging, consultation notes   Lab Tests: -I ordered, reviewed, and interpreted labs.   The pertinent results include:   Labs Reviewed  COMPREHENSIVE METABOLIC PANEL - Abnormal; Notable for the following components:      Result Value   Potassium 3.4 (*)    Glucose, Bld 191 (*)    BUN 27 (*)    Creatinine, Ser 1.64 (*)    Calcium 8.8 (*)    Albumin 3.1 (*)    GFR, Estimated 47 (*)    All other components within normal limits  CBC - Abnormal; Notable for the following components:   RBC 3.80 (*)    Hemoglobin 9.8 (*)    HCT 31.4 (*)    MCH 25.8 (*)    RDW 18.6 (*)    All other components within normal limits  URINALYSIS, ROUTINE W REFLEX MICROSCOPIC - Abnormal; Notable for the following components:   Color, Urine STRAW (*)    Hgb urine dipstick SMALL (*)    All other components within normal limits  POC OCCULT BLOOD, ED - Abnormal; Notable for the following components:   Fecal Occult Bld POSITIVE (*)    All other components within normal limits  TYPE AND SCREEN        Imaging Studies ordered: I ordered imaging studies including Ct angio GI bleed I independently visualized and interpreted imaging. I agree with the radiologist interpretation   Medicines ordered and prescription drug management: Meds ordered this encounter  Medications   morphine (PF) 4 MG/ML injection 4 mg   ondansetron (ZOFRAN) injection 4 mg   pantoprazole (PROTONIX) injection 40 mg   morphine (PF) 4 MG/ML injection 4 mg   iohexol (OMNIPAQUE) 350 MG/ML injection 100 mL   cefTRIAXone (ROCEPHIN) 2 g in sodium chloride 0.9 % 100 mL IVPB    Order Specific Question:   Antibiotic Indication:    Answer:   Intra-abdominal   metroNIDAZOLE  (FLAGYL) IVPB 500 mg    -I have reviewed the patients home medicines and have made adjustments as needed  Critical interventions none  Consultations Obtained: I requested consultation with the gastroenterologist on-call Dr. Levon Hedger,  and discussed lab and imaging findings as well as pertinent plan - they recommend: Hospital admission for flex sig   Cardiac Monitoring: The patient was maintained on a cardiac monitor.  I personally viewed and interpreted the cardiac monitored which showed an underlying rhythm of: NSR  Social Determinants of Health:  Factors impacting patients care include: Recent incarcerated, states he has not been using alcohol or illicit substances   Reevaluation: After the interventions noted above, I reevaluated the patient and found that they have :improved  Co morbidities that complicate the patient evaluation  Past Medical History:  Diagnosis Date   Aortic aneurysm (HCC) 2019   Chronic back pain    Cold    recent rx   Coronary artery disease    Degeneration of lumbar intervertebral disc    Diabetes mellitus    GERD (gastroesophageal reflux disease)    Gout    History of kidney stones    Hypertension    Pneumonia       Dispostion: I considered admission for this patient, and given GI bleed requiring flex sig patient require hospital admission.     Final Clinical Impression(s) / ED Diagnoses Final diagnoses:  None     @PCDICTATION @    Glendora Score, MD 10/31/23 1538

## 2023-10-31 NOTE — ED Notes (Signed)
Per Alisia Ferrari, MD, it is ok for him to have ice chips and clear liquids diet, npo after midnight.

## 2023-10-31 NOTE — H&P (Signed)
History and Physical    Patient: Thomas Donovan HYQ:[id number] DOB: Apr 11, 1961 DOA: 10/31/2023 DOS: the patient was seen and examined on 10/31/2023 PCP: Carmel Sacramento, NP  Patient coming from: Home  Chief Complaint:  Chief Complaint  Patient presents with   Rectal Bleeding   HPI: Thomas Donovan is a 62 y.o. male with medical history significant of polysubstance abuse, diabetes mellitus type 2, GERD, hypertension, atrial fibrillation, COPD, and more presents to the ED with a chief complaint of abdominal pain and blood in his stool.  Patient reports that he had pain and cramps starting in his abdomen around 11 PM last night.  They were intense until 3:30 in the morning and eased off.  During that time of intensity he was having painful diarrhea.  He reports that it felt like somebody was stabbing him up inside when he would have diarrhea.  He then had cramping in his pelvis.  All of that eased up around 3:30 AM.  Patient reports that he did take Motrin x 3 even though he knows he is not supposed to take Motrin.  It was an over-the-counter Motrin so that sounds like a total of 600 mg but he is not sure.  He reports that he had diarrhea 3-4 times.  The blood that he saw was bright red.  There was no mucus.  He has not had any fever but does report cold chills.  Patient's biggest complaint right now is his craving for alcohol.  Patient reports that he has been saved since going to jail.  He was drinking quite heavily about 18 days ago.  At that time he reports he was drinking a case of beer, or 1 to 2 pints of liquor, or 5-6 boot leggers.  3 or 4 days ago he did have some boot leg whiskey per his report.  He thinks he had just 1 swallow, then pushed it away.  He reports that he no longer craves any cocaine or crack, but the alcohol cravings still get to him.  His last cocaine use was also 17-18 days ago.  Patient denies any dizziness, chest pain, palpitations, or worsening shortness of breath.  Patient does  report the is chronic shortness of breath.  Since he has been in the ER he has had no more bloody bowel movements.  He denies any nausea or vomiting.  His pain has improved in the ER with pain medication, but is starting to come back.  Patient has no other complaints at this time.  Patient is on Eliquis for atrial fibrillation.  He reports his last dose was the morning of presentation, 10/31/2023.  CT noted a mass between his prostate and his rectum.  Patient was aware of that mass.  He wants to know if it is cancer.  We discussed the fact that it likely is a malignancy, of uncertain origin.  We discussed the possibility that could be prostate, rectal, or even peritoneal cancer.  Patient would like to see the oncologist while he is in the hospital.  We have discussed that cancer workup is usually several visits and includes a biopsy, but oncologist may be able to see him while he is here.  Patient does not smoke.  He does have a history of alcohol abuse and reports that his last drink was 3 or 4 days ago.  He does have a history of cocaine abuse and reports that his last cocaine use was 18 days ago.  Patient reports that he is  full code.  He has no other complaints at this time. Review of Systems: As mentioned in the history of present illness. All other systems reviewed and are negative. Past Medical History:  Diagnosis Date   Aortic aneurysm (HCC) 2019   Chronic back pain    Cold    recent rx   Coronary artery disease    Degeneration of lumbar intervertebral disc    Diabetes mellitus    GERD (gastroesophageal reflux disease)    Gout    History of kidney stones    Hypertension    Pneumonia    Past Surgical History:  Procedure Laterality Date   BACK SURGERY     IRRIGATION AND DEBRIDEMENT ABSCESS N/A 07/15/2019   Procedure: IRRIGATION AND DRAINAGE OF PERINEAL ABSCESS;  Surgeon: Franky Macho, MD;  Location: [facility];  Service: General;  Laterality: N/A;   MAXIMUM ACCESS (MAS)POSTERIOR LUMBAR  INTERBODY FUSION (PLIF) 1 LEVEL  11/20/2013   Procedure: FOR MAXIMUM ACCESS (MAS) POSTERIOR LUMBAR INTERBODY FUSION LUMBAR FIVE-SACRAL ONE;  Surgeon: Tia Alert, MD;  Location: [facility];  Service: Neurosurgery;;  FOR MAXIMUM ACCESS (MAS) POSTERIOR LUMBAR INTERBODY FUSION LUMBAR FIVE-SACRAL ONE   TONSILLECTOMY     Social History:  reports that he has quit smoking. His smoking use included cigarettes. He has a 5 pack-year smoking history. He has never used smokeless tobacco. He reports current alcohol use of about 12.0 standard drinks of alcohol per week. He reports that he does not use drugs.  Allergies  Allergen Reactions   Celebrex [Celecoxib] Anaphylaxis   Guaifenesin Anaphylaxis   Ibuprofen Other (See Comments) and Hives    Reaction:  GI bleeding  Other Reaction(s): OTC NSAIDS CAUSE BLOOD IN STOOLS   Tramadol Hives and Rash   Acetaminophen    Aspirin Other (See Comments)    Medication is contraindicated with pts gout medicine.  Other Reaction(s): PT WAS TOLD NOT TO TAKE BY MD DUE TO BEING ON COLCHICINE.   Ketorolac     Other Reaction(s): HIVES, BLISTERS BTW FINGERS.   Toradol [Ketorolac Tromethamine] Other (See Comments)    Reaction:  Blisters between fingers     Family History  Problem Relation Age of Onset   Colon cancer Neg Hx     Prior to Admission medications   Medication Sig Start Date End Date Taking? Authorizing Provider  albuterol (VENTOLIN HFA) 108 (90 Base) MCG/ACT inhaler Inhale 2 puffs into the lungs every 6 (six) hours as needed for wheezing or shortness of breath. 10/28/23  Yes Shah, Pratik D, DO  atorvastatin (LIPITOR) 40 MG tablet Take 1 tablet (40 mg total) by mouth daily. 10/28/23 11/27/23 Yes Shah, Pratik D, DO  colchicine 0.6 MG tablet Take 1-2 tablets (0.6-1.2 mg total) by mouth 2 (two) times daily as needed (for gout flares). 10/28/23  Yes Shah, Pratik D, DO  diltiazem (CARDIZEM CD) 180 MG 24 hr capsule Take 1 capsule (180 mg total) by mouth daily.  10/28/23 10/27/24 Yes Shah, Pratik D, DO  ELIQUIS 5 MG TABS tablet Take 1 tablet (5 mg total) by mouth 2 (two) times daily. 10/28/23  Yes Shah, Pratik D, DO  esomeprazole (NEXIUM) 40 MG capsule Take 1 capsule (40 mg total) by mouth daily. 10/28/23  Yes Shah, Pratik D, DO  fenofibrate micronized (LOFIBRA) 134 MG capsule Take 1 capsule (134 mg total) by mouth daily. 10/28/23  Yes Shah, Pratik D, DO  finasteride (PROSCAR) 5 MG tablet Take 1 tablet (5 mg total) by mouth daily. 10/28/23  10/27/24 Yes Shah, Pratik D, DO  furosemide (LASIX) 40 MG tablet Take 1 tablet (40 mg total) by mouth daily. 10/28/23  Yes Shah, Pratik D, DO  glipiZIDE (GLUCOTROL) 10 MG tablet Take 2 tablets (20 mg total) by mouth daily. 10/28/23  Yes Shah, Pratik D, DO  metFORMIN (GLUCOPHAGE) 850 MG tablet Take 1 tablet (850 mg total) by mouth every morning. 10/28/23 11/27/23 Yes Shah, Pratik D, DO  tamsulosin (FLOMAX) 0.4 MG CAPS capsule Take 1 capsule (0.4 mg total) by mouth daily. 10/28/23  Yes Shah, Pratik D, DO  TRELEGY ELLIPTA 100-62.5-25 MCG/ACT AEPB Inhale 1 puff into the lungs daily. 10/28/23  Yes Shah, Pratik D, DO  diclofenac Sodium (VOLTAREN) 1 % GEL Apply 2 g topically 4 (four) times daily as needed (back pain). Patient not taking: Reported on 10/31/2023 10/28/23   Maurilio Lovely D, DO    Physical Exam: Vitals:   10/31/23 1630 10/31/23 1645 10/31/23 1722 10/31/23 1745  BP: (!) 151/88 126/87 128/75 (!) 130/94  Pulse: 95 100 93 (!) 103  Resp:  18 18   Temp:  98 F (36.7 C) 97.7 F (36.5 C) 97.8 F (36.6 C)  TempSrc:  Oral  Oral  SpO2: 96% 99% 100% 100%  Weight:      Height:       1.  General: Patient lying supine in bed,  no acute distress   2. Psychiatric: Alert and oriented x 3, mood and behavior normal for situation, pleasant and cooperative with exam   3. Neurologic: Speech and language are normal, face is symmetric, moves all 4 extremities voluntarily, at baseline without acute deficits on limited exam   4. HEENMT:   Head is atraumatic, normocephalic, pupils reactive to light, neck is supple, trachea is midline, mucous membranes are moist   5. Respiratory : Lungs are clear to auscultation bilaterally without wheezing, rhonchi, rales, no cyanosis, no increase in work of breathing or accessory muscle use   6. Cardiovascular : Heart rate normal, rhythm is regular, no murmurs, rubs or gallops, no peripheral edema, peripheral pulses palpated   7. Gastrointestinal:  Abdomen is soft, nondistended, nontender to palpation bowel sounds active, no masses or organomegaly palpated   8. Skin:  Skin is warm, dry and intact without rashes, acute lesions, or ulcers on limited exam   9.Musculoskeletal:  No acute deformities or trauma, no asymmetry in tone, no peripheral edema, peripheral pulses palpated, no tenderness to palpation in the extremities  Data Reviewed: In the ED No leukocytosis with a white blood cell count of 7.0, hemoglobin 9.8 Blood pressure stable Slight hypokalemia at 3.4, elevated creatinine at 1.64 but is very close to his baseline Patient was typed and screened UA was not indicative of UTI Positive fecal occult blood test CTA shows a mass between the prostate and the rectum, possibly smoldering diverticulitis, questionable hepatic cirrhosis There was also a possible bleeding internal hemorrhoid GI was consulted and is going to do a flex sig tomorrow Assessment and Plan: * GI bleed -Hgb 9.8 -Stable when compared to baseline -Check CBC q6 hours -Protonix BID -Plan for GI to do flex sig in the AM -NPO -Typed and screened in the ED -Continue to monitor  DMII (diabetes mellitus, type 2) (HCC) -hold metformin and glipizide -Sliding scale coverage   Mass in rectum -Mass seen on CT -Discussed with patient that this is likely malignant, but the origin (rectal, prostate, etc) is unknown at this time -Patient requests consult with oncology -Discussed that workup for  malignancy is usually  done outpatient and may take several visits to complete -Patient reports understanding but would still like to talk to oncology while he is here if possible -Likely the etiology behind his pelvic pain -Continue pain control -Continue to monitor  Alcohol use -Last use 4-5 days ago -Used to drink heavily 1-2 pints of liquor per day -Reportedly cut way back on EtOH consumption 17-18 days ago -CIWA protocol -Naltrexone at discharge? -Continue to monitor  Paroxysmal atrial fibrillation with RVR (HCC) -Hold eliquis -Continue diltiazem -admission EKG pending -Continue to monitor  Acute on chronic blood loss anemia -Hgb has been as low as 6.6 in 08/2023 -Today Hgb 9.8 -FOBT positive -GI plan for flex sig in AM  GERD without esophagitis -Continue protonix BID  Hypokalemia -K+3.4 -Replace and recheck      Advance Care Planning:   Code Status: Full Code  Consults: GI and oncology  Family Communication: No family at bedside  Severity of Illness: The appropriate patient status for this patient is OBSERVATION. Observation status is judged to be reasonable and necessary in order to provide the required intensity of service to ensure the patient's safety. The patient's presenting symptoms, physical exam findings, and initial radiographic and laboratory data in the context of their medical condition is felt to place them at decreased risk for further clinical deterioration. Furthermore, it is anticipated that the patient will be medically stable for discharge from the hospital within 2 midnights of admission.   Author: Lilyan Gilford, DO 10/31/2023 8:04 PM  For on call review [email or web address].

## 2023-10-31 NOTE — Assessment & Plan Note (Signed)
-  Hgb 9.8 -Stable when compared to baseline -Check CBC q6 hours -Protonix BID -Plan for GI to do flex sig in the AM -NPO -Typed and screened in the ED -Continue to monitor

## 2023-10-31 NOTE — Assessment & Plan Note (Signed)
-  Last use 4-5 days ago -Used to drink heavily 1-2 pints of liquor per day -Reportedly cut way back on EtOH consumption 17-18 days ago -CIWA protocol -Naltrexone at discharge? -Continue to monitor

## 2023-10-31 NOTE — Progress Notes (Signed)
Patient states has been homeless in the past but now lives with brother and sister-in-law and that they are helping him work toward a place of his own

## 2023-10-31 NOTE — ED Triage Notes (Signed)
Pt in c/o rectal bleeding onset last night with x 5 red bloody stools, denies n/v, reports diarrhea, pt c/o pain in rectum that radiates to suprapubic area, pt c/o R sided flank pain, A&O x4

## 2023-10-31 NOTE — Assessment & Plan Note (Signed)
-  K+3.4 -Replace and recheck

## 2023-10-31 NOTE — Assessment & Plan Note (Signed)
-  Hold eliquis -Continue diltiazem -admission EKG pending -Continue to monitor

## 2023-10-31 NOTE — Assessment & Plan Note (Signed)
-  Mass seen on CT -Discussed with patient that this is likely malignant, but the origin (rectal, prostate, etc) is unknown at this time -Patient requests consult with oncology -Discussed that workup for malignancy is usually done outpatient and may take several visits to complete -Patient reports understanding but would still like to talk to oncology while he is here if possible -Likely the etiology behind his pelvic pain -Continue pain control -Continue to monitor

## 2023-10-31 NOTE — Assessment & Plan Note (Signed)
>>  ASSESSMENT AND PLAN FOR DMII (DIABETES MELLITUS, TYPE 2) (HCC) WRITTEN ON 10/31/2023  4:44 PM BY ZIERLE-GHOSH, ASIA B, DO  -hold metformin and glipizide -Sliding scale coverage

## 2023-11-01 DIAGNOSIS — I129 Hypertensive chronic kidney disease with stage 1 through stage 4 chronic kidney disease, or unspecified chronic kidney disease: Secondary | ICD-10-CM | POA: Diagnosis present

## 2023-11-01 DIAGNOSIS — F1410 Cocaine abuse, uncomplicated: Secondary | ICD-10-CM | POA: Diagnosis present

## 2023-11-01 DIAGNOSIS — K625 Hemorrhage of anus and rectum: Secondary | ICD-10-CM

## 2023-11-01 DIAGNOSIS — K7460 Unspecified cirrhosis of liver: Secondary | ICD-10-CM | POA: Diagnosis present

## 2023-11-01 DIAGNOSIS — D128 Benign neoplasm of rectum: Secondary | ICD-10-CM | POA: Diagnosis not present

## 2023-11-01 DIAGNOSIS — D62 Acute posthemorrhagic anemia: Secondary | ICD-10-CM | POA: Diagnosis present

## 2023-11-01 DIAGNOSIS — Z7901 Long term (current) use of anticoagulants: Secondary | ICD-10-CM | POA: Diagnosis not present

## 2023-11-01 DIAGNOSIS — I2510 Atherosclerotic heart disease of native coronary artery without angina pectoris: Secondary | ICD-10-CM | POA: Diagnosis present

## 2023-11-01 DIAGNOSIS — E876 Hypokalemia: Secondary | ICD-10-CM | POA: Diagnosis present

## 2023-11-01 DIAGNOSIS — E669 Obesity, unspecified: Secondary | ICD-10-CM | POA: Diagnosis present

## 2023-11-01 DIAGNOSIS — K529 Noninfective gastroenteritis and colitis, unspecified: Secondary | ICD-10-CM | POA: Diagnosis present

## 2023-11-01 DIAGNOSIS — I480 Paroxysmal atrial fibrillation: Secondary | ICD-10-CM | POA: Diagnosis present

## 2023-11-01 DIAGNOSIS — Z6836 Body mass index (BMI) 36.0-36.9, adult: Secondary | ICD-10-CM | POA: Diagnosis not present

## 2023-11-01 DIAGNOSIS — F17210 Nicotine dependence, cigarettes, uncomplicated: Secondary | ICD-10-CM | POA: Diagnosis present

## 2023-11-01 DIAGNOSIS — K5791 Diverticulosis of intestine, part unspecified, without perforation or abscess with bleeding: Secondary | ICD-10-CM | POA: Diagnosis present

## 2023-11-01 DIAGNOSIS — K5730 Diverticulosis of large intestine without perforation or abscess without bleeding: Secondary | ICD-10-CM | POA: Diagnosis not present

## 2023-11-01 DIAGNOSIS — D127 Benign neoplasm of rectosigmoid junction: Secondary | ICD-10-CM | POA: Diagnosis present

## 2023-11-01 DIAGNOSIS — K641 Second degree hemorrhoids: Secondary | ICD-10-CM | POA: Diagnosis present

## 2023-11-01 DIAGNOSIS — F1010 Alcohol abuse, uncomplicated: Secondary | ICD-10-CM | POA: Diagnosis present

## 2023-11-01 DIAGNOSIS — T80818A Extravasation of other vesicant agent, initial encounter: Secondary | ICD-10-CM | POA: Diagnosis present

## 2023-11-01 DIAGNOSIS — K922 Gastrointestinal hemorrhage, unspecified: Secondary | ICD-10-CM | POA: Diagnosis present

## 2023-11-01 DIAGNOSIS — C61 Malignant neoplasm of prostate: Secondary | ICD-10-CM | POA: Diagnosis present

## 2023-11-01 DIAGNOSIS — R1032 Left lower quadrant pain: Secondary | ICD-10-CM | POA: Diagnosis not present

## 2023-11-01 DIAGNOSIS — D125 Benign neoplasm of sigmoid colon: Secondary | ICD-10-CM | POA: Diagnosis present

## 2023-11-01 DIAGNOSIS — K6389 Other specified diseases of intestine: Secondary | ICD-10-CM | POA: Diagnosis not present

## 2023-11-01 DIAGNOSIS — J449 Chronic obstructive pulmonary disease, unspecified: Secondary | ICD-10-CM | POA: Diagnosis present

## 2023-11-01 DIAGNOSIS — E1165 Type 2 diabetes mellitus with hyperglycemia: Secondary | ICD-10-CM | POA: Diagnosis present

## 2023-11-01 DIAGNOSIS — E1122 Type 2 diabetes mellitus with diabetic chronic kidney disease: Secondary | ICD-10-CM | POA: Diagnosis present

## 2023-11-01 DIAGNOSIS — N1831 Chronic kidney disease, stage 3a: Secondary | ICD-10-CM | POA: Diagnosis present

## 2023-11-01 DIAGNOSIS — K219 Gastro-esophageal reflux disease without esophagitis: Secondary | ICD-10-CM | POA: Diagnosis present

## 2023-11-01 LAB — GLUCOSE, CAPILLARY
Glucose-Capillary: 119 mg/dL — ABNORMAL HIGH (ref 70–99)
Glucose-Capillary: 204 mg/dL — ABNORMAL HIGH (ref 70–99)
Glucose-Capillary: 194 mg/dL — ABNORMAL HIGH (ref 70–99)
Glucose-Capillary: 208 mg/dL — ABNORMAL HIGH (ref 70–99)

## 2023-11-01 LAB — CBC
HCT: 30.3 % — ABNORMAL LOW (ref 39.0–52.0)
HCT: 29.9 % — ABNORMAL LOW (ref 39.0–52.0)
Hemoglobin: 9.3 g/dL — ABNORMAL LOW (ref 13.0–17.0)
Hemoglobin: 9.1 g/dL — ABNORMAL LOW (ref 13.0–17.0)
MCH: 24.9 pg — ABNORMAL LOW (ref 26.0–34.0)
MCH: 24.9 pg — ABNORMAL LOW (ref 26.0–34.0)
MCHC: 30.7 g/dL (ref 30.0–36.0)
MCHC: 30.4 g/dL (ref 30.0–36.0)
MCV: 81.2 fL (ref 80.0–100.0)
MCV: 81.7 fL (ref 80.0–100.0)
Platelets: 280 10*3/uL (ref 150–400)
Platelets: 263 10*3/uL (ref 150–400)
RBC: 3.73 MIL/uL — ABNORMAL LOW (ref 4.22–5.81)
RBC: 3.66 MIL/uL — ABNORMAL LOW (ref 4.22–5.81)
RDW: 18.4 % — ABNORMAL HIGH (ref 11.5–15.5)
RDW: 18.4 % — ABNORMAL HIGH (ref 11.5–15.5)
WBC: 7.3 10*3/uL (ref 4.0–10.5)
WBC: 6 10*3/uL (ref 4.0–10.5)
nRBC: 0 % (ref 0.0–0.2)
nRBC: 0 % (ref 0.0–0.2)

## 2023-11-01 LAB — PROTIME-INR
INR: 1.1 (ref 0.8–1.2)
Prothrombin Time: 14.7 s (ref 11.4–15.2)

## 2023-11-01 LAB — PSA: Prostatic Specific Antigen: 16.87 ng/mL — ABNORMAL HIGH (ref 0.00–4.00)

## 2023-11-01 MED ORDER — OXYCODONE HCL 5 MG PO TABS
10.0000 mg | ORAL_TABLET | Freq: Four times a day (QID) | ORAL | Status: DC | PRN
  Administered 2023-11-01 – 2023-11-03 (×6): 10 mg via ORAL
  Filled 2023-11-01 (×7): qty 2

## 2023-11-01 MED ORDER — SIMETHICONE 80 MG PO CHEW
80.0000 mg | CHEWABLE_TABLET | Freq: Once | ORAL | Status: AC
  Administered 2023-11-01: 80 mg via ORAL
  Filled 2023-11-01: qty 1

## 2023-11-01 MED ORDER — THIAMINE MONONITRATE 100 MG PO TABS
100.0000 mg | ORAL_TABLET | Freq: Every day | ORAL | Status: DC
  Administered 2023-11-01 – 2023-11-03 (×3): 100 mg via ORAL
  Filled 2023-11-01 (×3): qty 1

## 2023-11-01 MED ORDER — ADULT MULTIVITAMIN W/MINERALS CH
1.0000 | ORAL_TABLET | Freq: Every day | ORAL | Status: DC
  Administered 2023-11-01 – 2023-11-03 (×3): 1 via ORAL
  Filled 2023-11-01 (×3): qty 1

## 2023-11-01 MED ORDER — HYDROMORPHONE HCL 1 MG/ML IJ SOLN
1.0000 mg | INTRAMUSCULAR | Status: DC | PRN
  Administered 2023-11-01 – 2023-11-03 (×8): 1 mg via INTRAVENOUS
  Filled 2023-11-01 (×8): qty 1

## 2023-11-01 MED ORDER — FOLIC ACID 1 MG PO TABS
1.0000 mg | ORAL_TABLET | Freq: Every day | ORAL | Status: DC
  Administered 2023-11-01 – 2023-11-03 (×3): 1 mg via ORAL
  Filled 2023-11-01 (×3): qty 1

## 2023-11-01 MED ORDER — LORAZEPAM 2 MG/ML IJ SOLN
1.0000 mg | INTRAMUSCULAR | Status: DC | PRN
  Administered 2023-11-01 – 2023-11-02 (×4): 1 mg via INTRAVENOUS
  Filled 2023-11-01 (×4): qty 1

## 2023-11-01 MED ORDER — MORPHINE SULFATE (PF) 2 MG/ML IV SOLN
2.0000 mg | Freq: Once | INTRAVENOUS | Status: AC | PRN
  Administered 2023-11-01: 2 mg via INTRAVENOUS
  Filled 2023-11-01: qty 1

## 2023-11-01 MED ORDER — THIAMINE HCL 100 MG/ML IJ SOLN: 100.0000 mg | Freq: Every day | INTRAMUSCULAR | Status: DC

## 2023-11-01 MED ORDER — LORAZEPAM 1 MG PO TABS: 1.0000 mg | ORAL_TABLET | ORAL | Status: DC | PRN

## 2023-11-01 NOTE — Hospital Course (Signed)
Thomas Donovan is a 62 y.o. male with medical history significant of polysubstance abuse, diabetes mellitus type 2, GERD, hypertension, atrial fibrillation, COPD,  presents to the ED with a chief complaint of abdominal pain and blood in his stool.  Abdominal pain with episodes of diarrhea, took Motrin x 3.  Reporting bright red blood with diarrhea.  He reports his last drink was 4 days ago.  He reports that he no longer craves any cocaine or crack, but the alcohol cravings still get to him.  His last cocaine use was also 17-18 days ago.    Patient is on Eliquis for atrial fibrillation.  He reports his last dose was the morning of presentation, 10/31/2023.   CT noted a mass between his prostate and his rectum.  Patient was aware of that mass.  He wants to know if it is cancer.  We discussed the fact that it likely is a malignancy, of uncertain origin.  We discussed the possibility that could be prostate, rectal, or even peritoneal cancer.  Patient would like to see the oncologist while he is in the hospital.  We have discussed that cancer workup is usually several visits and includes a biopsy, but oncologist may be able to see him while he is here.   Patient does not smoke.

## 2023-11-01 NOTE — Progress Notes (Signed)
Subjective: Patient states having a lot of lower abdominal pain and cramping. Has received dilaudid and morphine this morning but not feeling much improvement. No BMs this morning. Denies further rectal bleeding. No nausea or vomiting   States he feels he is having a flare up of gout in his feet/ankles. Had allopurinol in the past which seemed to make his symptoms worse. He is Wondering if he can get some steroids to help with this.   Objective: Vital signs in last 24 hours: Temp:  [97.7 F (36.5 C)-98.5 F (36.9 C)] 98.5 F (36.9 C) (11/06 0532) Pulse Rate:  [85-108] 102 (11/06 0532) Resp:  [18-20] 18 (11/06 0532) BP: (125-151)/(75-94) 125/76 (11/06 0532) SpO2:  [96 %-100 %] 97 % (11/06 0839) Weight:  [120.7 kg] 120.7 kg (11/05 1049) Last BM Date : 10/31/23 General:   Alert and oriented, pleasant Head:  Normocephalic and atraumatic. Eyes:  No icterus, sclera clear. Conjuctiva pink.  Heart:  S1, S2 present, no murmurs noted.  Lungs: Clear to auscultation bilaterally, without wheezing, rales, or rhonchi.  Abdomen:  Bowel sounds present, soft, TTP mid to left lower abdomen, non-distended. No HSM or hernias noted. No rebound or guarding. No masses appreciated  Msk:  Symmetrical without gross deformities. Normal posture. Extremities:  ankles bilaterally with mild, non pitting edema, no erythema noted.  Neurologic:  Alert and  oriented x4;  grossly normal neurologically. Skin:  Warm and dry, intact without significant lesions.  Psych:  Alert and cooperative. Normal mood and affect.  Intake/Output from previous day: 11/05 0701 - 11/06 0700 In: 120 [P.O.:120] Out: 900 [Urine:900]  Lab Results: Recent Labs    10/31/23 1702 10/31/23 2222 11/01/23 0439  WBC 5.3 5.4 7.3  HGB 9.3* 9.1* 9.3*  HCT 30.0* 29.0* 30.3*  PLT 284 247 280   BMET Recent Labs    10/31/23 1108  NA 138  K 3.4*  CL 103  CO2 25  GLUCOSE 191*  BUN 27*  CREATININE 1.64*  CALCIUM 8.8*   LFT Recent Labs     10/31/23 1108  PROT 6.6  ALBUMIN 3.1*  AST 21  ALT 25  ALKPHOS 87  BILITOT 0.3   PT/INR Recent Labs    11/01/23 0439  LABPROT 14.7  INR 1.1   Studies/Results: CT ANGIO ABDOMEN PELVIS  W & WO CONTRAST  Result Date: 10/31/2023 CLINICAL DATA:  Lower GI bleeding EXAM: CTA ABDOMEN AND PELVIS WITHOUT AND WITH CONTRAST TECHNIQUE: Multidetector CT imaging of the abdomen and pelvis was performed using the standard protocol during bolus administration of intravenous contrast. Multiplanar reconstructed images and MIPs were obtained and reviewed to evaluate the vascular anatomy. RADIATION DOSE REDUCTION: This exam was performed according to the departmental dose-optimization program which includes automated exposure control, adjustment of the mA and/or kV according to patient size and/or use of iterative reconstruction technique. CONTRAST:  OMNIPAQUE IOHEXOL 350 MG/ML SOLN COMPARISON:  CT scan of the abdomen and pelvis 10/03/2023; 05/18/2023 FINDINGS: VASCULAR Aorta: Normal caliber aorta without aneurysm, dissection, vasculitis or significant stenosis. Celiac: Patent without evidence of aneurysm, dissection, vasculitis or significant stenosis. SMA: The common hepatic artery is replaced to the SMA. No aneurysm, stenosis or dissection. Renals: Both renal arteries are patent without evidence of aneurysm, dissection, vasculitis, fibromuscular dysplasia or significant stenosis. IMA: Patent without evidence of aneurysm, dissection, vasculitis or significant stenosis. Inflow: Patent without evidence of aneurysm, dissection, vasculitis or significant stenosis. Slightly hypertrophic left middle rectal artery providing arterial hyperenhancement to the left hemorrhoidal cushion. Proximal  Outflow: Bilateral common femoral and visualized portions of the superficial and profunda femoral arteries are patent without evidence of aneurysm, dissection, vasculitis or significant stenosis. Veins: No obvious venous  abnormality within the limitations of this arterial phase study. Review of the MIP images confirms the above findings. NON-VASCULAR Lower chest: Linear atelectasis versus scarring in the visualized dependent lower lobes. The heart is normal in size. No pericardial effusion. No acute abnormality. Hepatobiliary: Mildly nodular contour of the liver. Question early cirrhotic change. No discrete hepatic lesion. Small calcified gallstone in the gallbladder lumen. No gallbladder distension, wall thickening or Peri cholecystic inflammation. Pancreas: Unremarkable. No pancreatic ductal dilatation or surrounding inflammatory changes. Spleen: Normal in size without focal abnormality. Adrenals/Urinary Tract: Normal adrenal glands. Stable punctate stone in the lower pole collecting system of the right kidney. No hydronephrosis, or enhancing renal lesion. Circumscribed water attenuation cystic lesions present bilaterally consistent with simple cysts. No imaging follow-up is recommended. The ureters and bladder are unremarkable. Stomach/Bowel: Stomach and duodenum are unremarkable. Normal appendix. Sigmoid colonic diverticulosis. There is a small amount of inflammatory stranding in the pericolonic fat with thickening of the retroperitoneum posteriorly. Similar findings have been seen across several prior studies and may be chronic in nature. Slight asymmetric wall thickening of the left rectum and recto anal junction. There is subtle extravasation of contrast material which appears to be emanating from the left hemorrhoidal cushion. Lymphatic: No suspicious lymphadenopathy. Reproductive: Enlarging ovoid soft tissue nodule posterior to the left prostate gland exerting local mass effect on the adjacent rectum. The abnormality measures approximately 2.6 cm today compared to 2.4 cm in July. Other: No significant abdominal ascites.  No abdominal wall hernia. Musculoskeletal: Surgical changes of prior L5-S1 posterior lumbosacral  interbody fusion with interbody graft. Multilevel degenerative disc disease and facet arthropathy. IMPRESSION: VASCULAR 1. Positive for hyperenhancement of the left hemorrhoidal cushion with probable contrast extravasation (active bleeding) from the hemorrhoid. There is a direct feeding artery from a hypertrophic left middle rectal artery arising from the anterior division of the left internal iliac artery. If hemostasis cannot be obtained manually, or endoscopically, catheter directed embolization would likely be possible. 2. No evidence of arterial aneurysm, dissection or significant atherosclerotic plaque. NON-VASCULAR 1. Enlarging soft tissue nodule position between the left aspect of the prostate gland and the rectum with local mass effect on the rectum. Differential considerations include prostate cancer versus exophytic rectal malignancy, GIST tumor or potentially a primary peritoneal lesion. This is been described on prior imaging and gadolinium-enhanced prostate MRI was previously suggested for further evaluation. In addition to that imaging suggestion, correlation with serum PSA and digital rectal examination is also recommended. 2. Sigmoid diverticulosis with some chronic stranding and peritoneal thickening. Smoldering diverticulitis is a consideration. 3. Questionable hepatic cirrhosis. No ascites on today's examination. 4. Additional ancillary findings as above without significant interval change. Electronically Signed   By: Malachy Moan M.D.   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    Rectal bleeding: CT  angio abdomen pelvis suggesting bleeding from left hemorrhoidal cushion.  States there is a direct feeding artery from the hypertrophic left middle renal artery arising from the anterior division of the left internal iliac artery.  States if hemostasis cannot be obtained manually or endoscopically, catheter directed embolization would likely be possible.  -Encouragingly, patient's rectal bleeding has tapered off, last episode around 330 Tuesday morning. Hgb has remained stable in the 9 range. - As last dose of eliquis was morning of 11/5, planning for flex sig Thursday  - there is some concern for diverticulitis on CT as well, we will defer complete colonoscopy to the outpatient setting in 6-8 weeks. - UDS yesterday positive for opiates and benzos   Indications, risks and benefits of procedure discussed in detail with patient. Patient verbalized understanding and is in agreement to proceed with flexible sigmoidoscopy tomorrow.  Lower abdominal and recta pain/soft tissue nodule between prostate/rectum on imaging: -New rectal and low abdominal pain onset late Monday night, which could be multifactorial.  CT angio A/P today suggesting smoldering diverticulitis which could explain abdominal pain. also with an enlarging soft tissue nodule between left aspect of prostate gland and the rectum with local mass effect on the rectum with differentials including prostate cancer versus exophytic rectal malignancy, GIST tumor, or potentially primary peritoneal lesion.  notably lesion was seen on previous imaging in the past,  recommended that he have serum PSA and gadolinium-enhanced prostates MRI.  - defer ordering of MRI to hospitalist, PSA and CEA pending  - started on rocephin and flagyl for diverticulitis per hospitalist. - Plans for flex sig tomorrow in light of  rectal/hemorrhoidal bleeding.   - Will need a complete colonoscopy in the outpatient setting in 6-8 weeks.   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 Stable at this time. will need outpatient follow-up for complete workup including serologic evaluation. Plt count 280k and INR 1.1, no need for EV screening at this time. No real need for antibiotics specifically for SBP prophylaxis as he was experiencing lower GI bleeding. MELD 3.0 with CMP from 11/5 13. AFP tumor marker pending   Plan: -MELD labs daily -Monitor h&h, transfuse for hgb <7 -Clear liquid diet, NPO Midnight  -Continue to hold eliquis -Plan for flex sig tomorrow  -Continue rocephin/flagyl for diverticulitis, can transition to PO Cipro/flagyl to complete full 10 day course -Outpatient cirrhosis care -PSA and prostate MRI per hospitalist -Full colonoscopy 6-8 weeks    LOS: 0 days    11/01/2023, 9:05 AM   Sophea Rackham L. Jeanmarie Hubert, MSN, APRN, AGNP-C Adult-Gerontology Nurse Practitioner [facility]

## 2023-11-01 NOTE — Progress Notes (Signed)
   11/01/23 1400  TOC Brief Assessment  Insurance and Status Reviewed  Patient has primary care physician Yes  Home environment has been reviewed lives at home  Prior level of function: independent  Prior/Current Home Services No current home services  Social Determinants of Health Reivew SDOH reviewed no interventions necessary  Readmission risk has been reviewed Yes  Transition of care needs no transition of care needs at this time

## 2023-11-01 NOTE — H&P (View-Only) (Signed)
Subjective: Patient states having a lot of lower abdominal pain and cramping. Has received dilaudid and morphine this morning but not feeling much improvement. No BMs this morning. Denies further rectal bleeding. No nausea or vomiting   States he feels he is having a flare up of gout in his feet/ankles. Had allopurinol in the past which seemed to make his symptoms worse. He is Wondering if he can get some steroids to help with this.   Objective: Vital signs in last 24 hours: Temp:  [97.7 F (36.5 C)-98.5 F (36.9 C)] 98.5 F (36.9 C) (11/06 0532) Pulse Rate:  [85-108] 102 (11/06 0532) Resp:  [18-20] 18 (11/06 0532) BP: (125-151)/(75-94) 125/76 (11/06 0532) SpO2:  [96 %-100 %] 97 % (11/06 0839) Weight:  [120.7 kg] 120.7 kg (11/05 1049) Last BM Date : 10/31/23 General:   Alert and oriented, pleasant Head:  Normocephalic and atraumatic. Eyes:  No icterus, sclera clear. Conjuctiva pink.  Heart:  S1, S2 present, no murmurs noted.  Lungs: Clear to auscultation bilaterally, without wheezing, rales, or rhonchi.  Abdomen:  Bowel sounds present, soft, TTP mid to left lower abdomen, non-distended. No HSM or hernias noted. No rebound or guarding. No masses appreciated  Msk:  Symmetrical without gross deformities. Normal posture. Extremities:  ankles bilaterally with mild, non pitting edema, no erythema noted.  Neurologic:  Alert and  oriented x4;  grossly normal neurologically. Skin:  Warm and dry, intact without significant lesions.  Psych:  Alert and cooperative. Normal mood and affect.  Intake/Output from previous day: 11/05 0701 - 11/06 0700 In: 120 [P.O.:120] Out: 900 [Urine:900]  Lab Results: Recent Labs    10/31/23 1702 10/31/23 2222 11/01/23 0439  WBC 5.3 5.4 7.3  HGB 9.3* 9.1* 9.3*  HCT 30.0* 29.0* 30.3*  PLT 284 247 280   BMET Recent Labs    10/31/23 1108  NA 138  K 3.4*  CL 103  CO2 25  GLUCOSE 191*  BUN 27*  CREATININE 1.64*  CALCIUM 8.8*   LFT Recent Labs     10/31/23 1108  PROT 6.6  ALBUMIN 3.1*  AST 21  ALT 25  ALKPHOS 87  BILITOT 0.3   PT/INR Recent Labs    11/01/23 0439  LABPROT 14.7  INR 1.1   Studies/Results: CT ANGIO ABDOMEN PELVIS  W & WO CONTRAST  Result Date: 10/31/2023 CLINICAL DATA:  Lower GI bleeding EXAM: CTA ABDOMEN AND PELVIS WITHOUT AND WITH CONTRAST TECHNIQUE: Multidetector CT imaging of the abdomen and pelvis was performed using the standard protocol during bolus administration of intravenous contrast. Multiplanar reconstructed images and MIPs were obtained and reviewed to evaluate the vascular anatomy. RADIATION DOSE REDUCTION: This exam was performed according to the departmental dose-optimization program which includes automated exposure control, adjustment of the mA and/or kV according to patient size and/or use of iterative reconstruction technique. CONTRAST:  OMNIPAQUE IOHEXOL 350 MG/ML SOLN COMPARISON:  CT scan of the abdomen and pelvis 10/03/2023; 05/18/2023 FINDINGS: VASCULAR Aorta: Normal caliber aorta without aneurysm, dissection, vasculitis or significant stenosis. Celiac: Patent without evidence of aneurysm, dissection, vasculitis or significant stenosis. SMA: The common hepatic artery is replaced to the SMA. No aneurysm, stenosis or dissection. Renals: Both renal arteries are patent without evidence of aneurysm, dissection, vasculitis, fibromuscular dysplasia or significant stenosis. IMA: Patent without evidence of aneurysm, dissection, vasculitis or significant stenosis. Inflow: Patent without evidence of aneurysm, dissection, vasculitis or significant stenosis. Slightly hypertrophic left middle rectal artery providing arterial hyperenhancement to the left hemorrhoidal cushion. Proximal  Outflow: Bilateral common femoral and visualized portions of the superficial and profunda femoral arteries are patent without evidence of aneurysm, dissection, vasculitis or significant stenosis. Veins: No obvious venous  abnormality within the limitations of this arterial phase study. Review of the MIP images confirms the above findings. NON-VASCULAR Lower chest: Linear atelectasis versus scarring in the visualized dependent lower lobes. The heart is normal in size. No pericardial effusion. No acute abnormality. Hepatobiliary: Mildly nodular contour of the liver. Question early cirrhotic change. No discrete hepatic lesion. Small calcified gallstone in the gallbladder lumen. No gallbladder distension, wall thickening or Peri cholecystic inflammation. Pancreas: Unremarkable. No pancreatic ductal dilatation or surrounding inflammatory changes. Spleen: Normal in size without focal abnormality. Adrenals/Urinary Tract: Normal adrenal glands. Stable punctate stone in the lower pole collecting system of the right kidney. No hydronephrosis, or enhancing renal lesion. Circumscribed water attenuation cystic lesions present bilaterally consistent with simple cysts. No imaging follow-up is recommended. The ureters and bladder are unremarkable. Stomach/Bowel: Stomach and duodenum are unremarkable. Normal appendix. Sigmoid colonic diverticulosis. There is a small amount of inflammatory stranding in the pericolonic fat with thickening of the retroperitoneum posteriorly. Similar findings have been seen across several prior studies and may be chronic in nature. Slight asymmetric wall thickening of the left rectum and recto anal junction. There is subtle extravasation of contrast material which appears to be emanating from the left hemorrhoidal cushion. Lymphatic: No suspicious lymphadenopathy. Reproductive: Enlarging ovoid soft tissue nodule posterior to the left prostate gland exerting local mass effect on the adjacent rectum. The abnormality measures approximately 2.6 cm today compared to 2.4 cm in July. Other: No significant abdominal ascites.  No abdominal wall hernia. Musculoskeletal: Surgical changes of prior L5-S1 posterior lumbosacral  interbody fusion with interbody graft. Multilevel degenerative disc disease and facet arthropathy. IMPRESSION: VASCULAR 1. Positive for hyperenhancement of the left hemorrhoidal cushion with probable contrast extravasation (active bleeding) from the hemorrhoid. There is a direct feeding artery from a hypertrophic left middle rectal artery arising from the anterior division of the left internal iliac artery. If hemostasis cannot be obtained manually, or endoscopically, catheter directed embolization would likely be possible. 2. No evidence of arterial aneurysm, dissection or significant atherosclerotic plaque. NON-VASCULAR 1. Enlarging soft tissue nodule position between the left aspect of the prostate gland and the rectum with local mass effect on the rectum. Differential considerations include prostate cancer versus exophytic rectal malignancy, GIST tumor or potentially a primary peritoneal lesion. This is been described on prior imaging and gadolinium-enhanced prostate MRI was previously suggested for further evaluation. In addition to that imaging suggestion, correlation with serum PSA and digital rectal examination is also recommended. 2. Sigmoid diverticulosis with some chronic stranding and peritoneal thickening. Smoldering diverticulitis is a consideration. 3. Questionable hepatic cirrhosis. No ascites on today's examination. 4. Additional ancillary findings as above without significant interval change. Electronically Signed   By: Malachy Moan M.D.   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    Rectal bleeding: CT  angio abdomen pelvis suggesting bleeding from left hemorrhoidal cushion.  States there is a direct feeding artery from the hypertrophic left middle renal artery arising from the anterior division of the left internal iliac artery.  States if hemostasis cannot be obtained manually or endoscopically, catheter directed embolization would likely be possible.  -Encouragingly, patient's rectal bleeding has tapered off, last episode around 330 Tuesday morning. Hgb has remained stable in the 9 range. - As last dose of eliquis was morning of 11/5, planning for flex sig Thursday  - there is some concern for diverticulitis on CT as well, we will defer complete colonoscopy to the outpatient setting in 6-8 weeks. - UDS yesterday positive for opiates and benzos   Indications, risks and benefits of procedure discussed in detail with patient. Patient verbalized understanding and is in agreement to proceed with flexible sigmoidoscopy tomorrow.  Lower abdominal and recta pain/soft tissue nodule between prostate/rectum on imaging: -New rectal and low abdominal pain onset late Monday night, which could be multifactorial.  CT angio A/P today suggesting smoldering diverticulitis which could explain abdominal pain. also with an enlarging soft tissue nodule between left aspect of prostate gland and the rectum with local mass effect on the rectum with differentials including prostate cancer versus exophytic rectal malignancy, GIST tumor, or potentially primary peritoneal lesion.  notably lesion was seen on previous imaging in the past,  recommended that he have serum PSA and gadolinium-enhanced prostates MRI.  - defer ordering of MRI to hospitalist, PSA and CEA pending  - started on rocephin and flagyl for diverticulitis per hospitalist. - Plans for flex sig tomorrow in light of  rectal/hemorrhoidal bleeding.   - Will need a complete colonoscopy in the outpatient setting in 6-8 weeks.   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 Stable at this time. will need outpatient follow-up for complete workup including serologic evaluation. Plt count 280k and INR 1.1, no need for EV screening at this time. No real need for antibiotics specifically for SBP prophylaxis as he was experiencing lower GI bleeding. MELD 3.0 with CMP from 11/5 13. AFP tumor marker pending   Plan: -MELD labs daily -Monitor h&h, transfuse for hgb <7 -Clear liquid diet, NPO Midnight  -Continue to hold eliquis -Plan for flex sig tomorrow  -Continue rocephin/flagyl for diverticulitis, can transition to PO Cipro/flagyl to complete full 10 day course -Outpatient cirrhosis care -PSA and prostate MRI per hospitalist -Full colonoscopy 6-8 weeks    LOS: 0 days    11/01/2023, 9:05 AM   Sophea Rackham L. Jeanmarie Hubert, MSN, APRN, AGNP-C Adult-Gerontology Nurse Practitioner [facility]

## 2023-11-01 NOTE — Progress Notes (Signed)
PROGRESS NOTE    Patient: Thomas Donovan                            PCP: Carmel Sacramento, NP                    DOB: October 18, 1961            DOA: 10/31/2023 HKV:[id number]             DOS: 11/01/2023, 1:45 PM   LOS: 0 days   Date of Service: The patient was seen and examined on 11/01/2023  Subjective:   The patient was seen and examined this morning. Hemodynamically stable. Planing of abdominal pain especially left lower quadrant, denies any further bowel movements  Or spontaneous bleeding from rectum.    Brief Narrative:   Thomas Donovan is a 62 y.o. male with medical history significant of polysubstance abuse, diabetes mellitus type 2, GERD, hypertension, atrial fibrillation, COPD,  presents to the ED with a chief complaint of abdominal pain and blood in his stool.  Abdominal pain with episodes of diarrhea, took Motrin x 3.  Reporting bright red blood with diarrhea.  He reports his last drink was 4 days ago.  He reports that he no longer craves any cocaine or crack, but the alcohol cravings still get to him.  His last cocaine use was also 17-18 days ago.    Patient is on Eliquis for atrial fibrillation.  He reports his last dose was the morning of presentation, 10/31/2023.   CT noted a mass between his prostate and his rectum.  Patient was aware of that mass.  He wants to know if it is cancer.  We discussed the fact that it likely is a malignancy, of uncertain origin.  We discussed the possibility that could be prostate, rectal, or even peritoneal cancer.  Patient would like to see the oncologist while he is in the hospital.  We have discussed that cancer workup is usually several visits and includes a biopsy, but oncologist may be able to see him while he is here.   Patient does not smoke.     Assessment & Plan:   Principal Problem:   GI bleed Active Problems:   Hypokalemia   GERD without esophagitis   Acute on chronic blood loss anemia   Paroxysmal atrial fibrillation with  RVR (HCC)   Alcohol use   Mass in rectum   DMII (diabetes mellitus, type 2) (HCC)     Assessment and Plan: * GI bleed -Hgb 9.8 -Stable when compared to baseline -Check CBC q6 hours -Protonix BID -Plan for GI to do flex sig in the AM -NPO -Typed and screened in the ED -Continue to monitor  DMII (diabetes mellitus, type 2) (HCC) -hold metformin and glipizide -Sliding scale coverage   Mass in rectum -Mass seen on CT - -pending GI evaluation -Ruling out malignant -On clear liquid diet, n.p.o. after midnight GI planning for flex sigmoidoscopy in a.m. -Continue empiric antibiotics for possible diverticulitis currently on Rocephin/Flagyl>> once stable will switch to p.o. Cipro and Flagyl  -Discussed that workup for malignancy is usually done outpatient and may take several visits to complete -Patient reports understanding but would still like to talk to oncology while he is here if possible -Likely the etiology behind his pelvic pain -Continue pain control -Continue to monitor  Alcohol use -Last use 4-5 days ago -Used to drink heavily 1-2 pints of liquor  per day -Reportedly cut way back on EtOH consumption 17-18 days ago -CIWA protocol -Naltrexone at discharge? -Continue to monitor  Paroxysmal atrial fibrillation with RVR (HCC) -Hold eliquis -Continue diltiazem -admission EKG pending -Continue to monitor  Acute on chronic blood loss anemia -Hgb has been as low as 6.6 in 08/2023 -Today Hgb 9.8 >>9.1  -FOBT positive -GI following  GERD without esophagitis -Continue protonix BID  Hypokalemia -K+3.4 -Replace and recheck   ------------------------------------------------------------------------------------------------------------------------------------- Nutritional status:  The patient's BMI is: Body mass index is 36.08 kg/m. I agree with the assessment and plan as outlined below: Nutrition Status:      SkinDVT prophylaxis:  SCDs Start: 10/31/23  1630   Code Status:   Code Status: Full Code  Family Communication: No family member present at bedside- attempt will be made to update daily  -Advance care planning has been discussed.   Admission status:   Status is: INPt  The patient will require care spanning > 2 midnights and should be moved to inpatient because:    Disposition: From  - home             Planning for discharge in 1-2 days: to   Procedures:   No admission procedures for hospital encounter.   Antimicrobials:  Anti-infectives (From admission, onward)    Start     Dose/Rate Route Frequency Ordered Stop   11/01/23 1500  cefTRIAXone (ROCEPHIN) 1 g in sodium chloride 0.9 % 100 mL IVPB  Status:  Discontinued        1 g 200 mL/hr over 30 Minutes Intravenous Every 24 hours 10/31/23 1629 10/31/23 1709   11/01/23 1500  cefTRIAXone (ROCEPHIN) 2 g in sodium chloride 0.9 % 100 mL IVPB        2 g 200 mL/hr over 30 Minutes Intravenous Every 24 hours 10/31/23 1709     10/31/23 1530  cefTRIAXone (ROCEPHIN) 2 g in sodium chloride 0.9 % 100 mL IVPB        2 g 200 mL/hr over 30 Minutes Intravenous  Once 10/31/23 1516 10/31/23 1640   10/31/23 1530  metroNIDAZOLE (FLAGYL) IVPB 500 mg        500 mg 100 mL/hr over 60 Minutes Intravenous  Once 10/31/23 1516 10/31/23 1906        Medication:   atorvastatin  40 mg Oral Daily   diltiazem  180 mg Oral Daily   fenofibrate  54 mg Oral Daily   finasteride  5 mg Oral Daily   fluticasone furoate-vilanterol  1 puff Inhalation Daily   And   umeclidinium bromide  1 puff Inhalation Daily   insulin aspart  0-15 Units Subcutaneous TID WC   insulin aspart  0-5 Units Subcutaneous QHS   naltrexone  50 mg Oral Daily   pantoprazole (PROTONIX) IV  40 mg Intravenous Q12H   tamsulosin  0.4 mg Oral Daily    HYDROmorphone (DILAUDID) injection, ondansetron **OR** ondansetron (ZOFRAN) IV, oxyCODONE   Objective:   Vitals:   10/31/23 2031 11/01/23 0532 11/01/23 0839 11/01/23 1335  BP:  (!) 133/93 125/76  120/70  Pulse: 85 (!) 102    Resp: 20 18  18   Temp: 97.8 F (36.6 C) 98.5 F (36.9 C)  98.2 F (36.8 C)  TempSrc: Oral Oral  Oral  SpO2: 98% 97% 97%   Weight:      Height:        Intake/Output Summary (Last 24 hours) at 11/01/2023 1345 Last data filed at 11/01/2023 0500 Gross per 24 hour  Intake 120 ml  Output 900 ml  Net -780 ml   Filed Weights   10/31/23 1049  Weight: 120.7 kg     Physical examination:   Constitution:  Alert, cooperative, no distress,  Appears calm and comfortable  Psychiatric:   Normal and stable mood and affect, cognition intact,   HEENT:        Normocephalic, PERRL, otherwise with in Normal limits  Chest:         Chest symmetric Cardio vascular:  S1/S2, RRR, No murmure, No Rubs or Gallops  pulmonary: Clear to auscultation bilaterally, respirations unlabored, negative wheezes / crackles Abdomen: Soft, LLQ tenderness, non-distended, bowel sounds,no masses, no organomegaly Muscular skeletal: Limited exam - in bed, able to move all 4 extremities,   Neuro: CNII-XII intact. , normal motor and sensation, reflexes intact  Extremities: No pitting edema lower extremities, +2 pulses  Skin: Dry, warm to touch, negative for any Rashes, No open wounds Wounds: per nursing documentation   ------------------------------------------------------------------------------------------------------------------------------------------    LABs:     Latest Ref Rng & Units 11/01/2023   10:18 AM 11/01/2023    4:39 AM 10/31/2023   10:22 PM  CBC  WBC 4.0 - 10.5 K/uL 6.0  7.3  5.4   Hemoglobin 13.0 - 17.0 g/dL 9.1  9.3  9.1   Hematocrit 39.0 - 52.0 % 29.9  30.3  29.0   Platelets 150 - 400 K/uL 263  280  247       Latest Ref Rng & Units 10/31/2023   11:08 AM 10/28/2023    4:34 AM 10/27/2023    4:54 AM  CMP  Glucose 70 - 99 mg/dL 098  119  147   BUN 8 - 23 mg/dL 27  40  45   Creatinine 0.61 - 1.24 mg/dL 8.29  5.62  1.30   Sodium 135 - 145 mmol/L 138   134  132   Potassium 3.5 - 5.1 mmol/L 3.4  3.7  4.8   Chloride 98 - 111 mmol/L 103  102  99   CO2 22 - 32 mmol/L 25  25  22    Calcium 8.9 - 10.3 mg/dL 8.8  8.8  8.8   Total Protein 6.5 - 8.1 g/dL 6.6   6.7   Total Bilirubin <1.2 mg/dL 0.3   0.5   Alkaline Phos 38 - 126 U/L 87   101   AST 15 - 41 U/L 21   18   ALT 0 - 44 U/L 25   20        Micro Results Recent Results (from the past 240 hour(s))  MRSA Next Gen by PCR, Nasal     Status: None   Collection Time: 10/26/23 11:45 PM   Specimen: Nasal Mucosa; Nasal Swab  Result Value Ref Range Status   MRSA by PCR Next Gen NOT DETECTED NOT DETECTED Final    Comment: (NOTE) The GeneXpert MRSA Assay (FDA approved for NASAL specimens only), is one component of a comprehensive MRSA colonization surveillance program. It is not intended to diagnose MRSA infection nor to guide or monitor treatment for MRSA infections. Test performance is not FDA approved in patients less than 9 years old. Performed at [facility], [address] [place], [place] [address]     Radiology Reports No results found.  SIGNED: Kendell Bane, MD, FHM. FAAFP. [name] - [facility] Time spent - 35 min.  In seeing, evaluating and examining the patient. Reviewing medical records, labs, drawn plan of care.  [facility],  Pager (please use [email or web address] to page/ text) Please use Epic Secure Chat for non-urgent communication (7AM-7PM)  If 7PM-7AM, please contact night-coverage [email or web address], 11/01/2023, 1:45 PM

## 2023-11-01 NOTE — Care Management Obs Status (Signed)
MEDICARE OBSERVATION STATUS NOTIFICATION   Patient Details  Name: Thomas Donovan MRN: [id number] Date of Birth: October 25, 1961   Medicare Observation Status Notification Given:  Yes    Corey Harold 11/01/2023, 1:04 PM

## 2023-11-01 NOTE — Progress Notes (Signed)
Mobility Specialist Progress Note:    11/01/23 1420  Mobility  Activity Ambulated with assistance to bathroom  Level of Assistance Standby assist, set-up cues, supervision of patient - no hands on  Assistive Device None  Distance Ambulated (ft) 15 ft  Range of Motion/Exercises Active;All extremities  Activity Response Tolerated well  Mobility Referral Yes  $Mobility charge 1 Mobility  Mobility Specialist Start Time (ACUTE ONLY) 1415  Mobility Specialist Stop Time (ACUTE ONLY) 1425  Mobility Specialist Time Calculation (min) (ACUTE ONLY) 10 min   Pt received in bed, agreeable to mobility. Required SBA to stand and ambulate with no AD. Tolerated well, pt refused further mobility d/t an 8.5/10 pain rating. Returned pt supine, bed alarm on. All needs met.  Lawerance Bach Mobility Specialist Please contact via Special educational needs teacher or  Rehab office at [phone]

## 2023-11-02 ENCOUNTER — Encounter (HOSPITAL_COMMUNITY): Payer: Self-pay | Admitting: Family Medicine

## 2023-11-02 ENCOUNTER — Encounter (HOSPITAL_COMMUNITY)
Admission: EM | Disposition: A | Discharge status: Home / Self Care | Payer: Self-pay | Source: Home / Self Care | Attending: Family Medicine

## 2023-11-02 ENCOUNTER — Inpatient Hospital Stay (HOSPITAL_COMMUNITY): Payer: Medicare HMO | Admitting: Certified Registered"

## 2023-11-02 DIAGNOSIS — D127 Benign neoplasm of rectosigmoid junction: Secondary | ICD-10-CM | POA: Diagnosis not present

## 2023-11-02 DIAGNOSIS — K5730 Diverticulosis of large intestine without perforation or abscess without bleeding: Secondary | ICD-10-CM

## 2023-11-02 DIAGNOSIS — K6389 Other specified diseases of intestine: Secondary | ICD-10-CM

## 2023-11-02 DIAGNOSIS — R1032 Left lower quadrant pain: Secondary | ICD-10-CM

## 2023-11-02 DIAGNOSIS — K625 Hemorrhage of anus and rectum: Secondary | ICD-10-CM | POA: Diagnosis not present

## 2023-11-02 DIAGNOSIS — I2510 Atherosclerotic heart disease of native coronary artery without angina pectoris: Secondary | ICD-10-CM | POA: Diagnosis not present

## 2023-11-02 DIAGNOSIS — N429 Disorder of prostate, unspecified: Secondary | ICD-10-CM

## 2023-11-02 DIAGNOSIS — R933 Abnormal findings on diagnostic imaging of other parts of digestive tract: Secondary | ICD-10-CM

## 2023-11-02 DIAGNOSIS — D128 Benign neoplasm of rectum: Secondary | ICD-10-CM

## 2023-11-02 DIAGNOSIS — K641 Second degree hemorrhoids: Secondary | ICD-10-CM

## 2023-11-02 HISTORY — PX: FLEXIBLE SIGMOIDOSCOPY: SHX5431

## 2023-11-02 HISTORY — PX: BIOPSY: SHX5522

## 2023-11-02 LAB — COMPREHENSIVE METABOLIC PANEL
ALT: 20 U/L (ref 0–44)
AST: 18 U/L (ref 15–41)
Albumin: 3 g/dL — ABNORMAL LOW (ref 3.5–5.0)
Alkaline Phosphatase: 76 U/L (ref 38–126)
Anion gap: 9 (ref 5–15)
BUN: 9 mg/dL (ref 8–23)
CO2: 26 mmol/L (ref 22–32)
Calcium: 9 mg/dL (ref 8.9–10.3)
Chloride: 103 mmol/L (ref 98–111)
Creatinine, Ser: 1.29 mg/dL — ABNORMAL HIGH (ref 0.61–1.24)
GFR, Estimated: >60 mL/min (ref >60)
Glucose, Bld: 144 mg/dL — ABNORMAL HIGH (ref 70–99)
Potassium: 3 mmol/L — ABNORMAL LOW (ref 3.5–5.1)
Sodium: 138 mmol/L (ref 135–145)
Total Bilirubin: 0.4 mg/dL (ref <1.2)
Total Protein: 6.3 g/dL — ABNORMAL LOW (ref 6.5–8.1)

## 2023-11-02 LAB — GLUCOSE, CAPILLARY
Glucose-Capillary: 151 mg/dL — ABNORMAL HIGH (ref 70–99)
Glucose-Capillary: 131 mg/dL — ABNORMAL HIGH (ref 70–99)
Glucose-Capillary: 119 mg/dL — ABNORMAL HIGH (ref 70–99)
Glucose-Capillary: 100 mg/dL — ABNORMAL HIGH (ref 70–99)
Glucose-Capillary: 129 mg/dL — ABNORMAL HIGH (ref 70–99)
Glucose-Capillary: 218 mg/dL — ABNORMAL HIGH (ref 70–99)

## 2023-11-02 LAB — CEA: CEA: 2.2 ng/mL (ref 0.0–4.7)

## 2023-11-02 LAB — AFP TUMOR MARKER: AFP, Serum, Tumor Marker: 2.6 ng/mL (ref 0.0–8.4)

## 2023-11-02 SURGERY — SIGMOIDOSCOPY, FLEXIBLE
Anesthesia: General

## 2023-11-02 MED ORDER — PROPOFOL 10 MG/ML IV BOLUS
INTRAVENOUS | Status: DC | PRN
  Administered 2023-11-02: 150 ug/kg/min via INTRAVENOUS

## 2023-11-02 MED ORDER — SODIUM CHLORIDE 0.9 % IV SOLN: INTRAVENOUS | Status: DC

## 2023-11-02 MED ORDER — METRONIDAZOLE 500 MG PO TABS
500.0000 mg | ORAL_TABLET | Freq: Three times a day (TID) | ORAL | Status: DC
  Administered 2023-11-02 – 2023-11-03 (×2): 500 mg via ORAL
  Filled 2023-11-02 (×2): qty 1

## 2023-11-02 MED ORDER — SODIUM CHLORIDE 0.9% FLUSH: 10.0000 mL | Freq: Two times a day (BID) | INTRAVENOUS | Status: DC

## 2023-11-02 MED ORDER — SODIUM CHLORIDE 0.9 % IV SOLN: INTRAVENOUS | Status: DC | PRN

## 2023-11-02 MED ORDER — LIDOCAINE HCL (PF) 2 % IJ SOLN
INTRAMUSCULAR | Status: DC | PRN
  Administered 2023-11-02: 50 mg via INTRADERMAL

## 2023-11-02 MED ORDER — POTASSIUM CHLORIDE 10 MEQ/100ML IV SOLN
10.0000 meq | INTRAVENOUS | Status: AC
  Administered 2023-11-02 (×3): 10 meq via INTRAVENOUS
  Filled 2023-11-02 (×3): qty 100

## 2023-11-02 NOTE — Discharge Instructions (Signed)

## 2023-11-02 NOTE — Op Note (Signed)
[facility] Patient Name: Thomas Donovan Procedure Date: 11/02/2023 2:34 PM MRN: [id number] Date of Birth: 26-Sep-1961 Attending MD: Sanjuan Dame , MD, [id number] CSN: [id number] Age: 62 Admit Type: Outpatient Procedure:                Flexible Sigmoidoscopy Indications:              Abdominal pain in the left lower quadrant,                            Hematochezia, Abnormal CT of the GI tract Providers:                Sanjuan Dame, MD, Edrick Kins, RN, Dyann Ruddle Referring MD:              Medicines:                Monitored Anesthesia Care Complications:            No immediate complications. Estimated Blood Loss:     Estimated blood loss was minimal. Procedure:                Pre-Anesthesia Assessment:                           - Prior to the procedure, a History and Physical                            was performed, and patient medications and                            allergies were reviewed. The patient's tolerance of                            previous anesthesia was also reviewed. The risks                            and benefits of the procedure and the sedation                            options and risks were discussed with the patient.                            All questions were answered, and informed consent                            was obtained. Prior Anticoagulants: The patient has                            taken no anticoagulant or antiplatelet agents. ASA                            Grade Assessment: III - A patient with severe                            systemic disease. After reviewing the risks and  benefits, the patient was deemed in satisfactory                            condition to undergo the procedure.                           After obtaining informed consent, the scope was                            passed under direct vision. The flexible                            sigmoidoscopy was accomplished without difficulty.                             The patient tolerated the procedure well. The                            quality of the bowel preparation was fair. The                            [phone]) scope was introduced through the                            anus and advanced to the the descending colon. Scope In: 2:54:49 PM Scope Out: 3:07:20 PM Total Procedure Duration: 0 hours 12 minutes 31 seconds  Findings:      The digital rectal exam findings include increased firmness of the       prostate, boggy texture of the prostate and rubbery texture of the       prostate.      An area of mildly erythematous mucosa was found in the sigmoid colon.       This was biopsied with a cold forceps for histology.      There is no endoscopic evidence of mass in the rectum.      Multiple sessile polyps were found in the rectum and recto-sigmoid       colon. The polyps were 2 to 10 mm in size. Polypectomy was not attempted. Impression:               - Preparation of the colon was fair.                           - Increased firmness of the prostate, boggy texture                            of the prostate and rubbery texture of the prostate                            found on digital rectal exam.                           - Erythematous mucosa in the sigmoid colon.                            Biopsied.                           -  Multiple 2 to 10 mm polyps in the rectum and at                            the recto-sigmoid colon. Resection not attempted                            given indication of the procedure                           -Submucosal lesion lesion likely External                            compression Moderate Sedation:      Per Anesthesia Care Recommendation:           Obtain MRI Pelvis given CT findings and these                            findings of external rectal compression                           Complete colonoscopy with removal of polyps as                             outpatient Procedure Code(s):        --- Professional ---                           [phone], Sigmoidoscopy, flexible; with biopsy, single                            or multiple Diagnosis Code(s):        --- Professional ---                           N42.9, Disorder of prostate, unspecified                           K63.89, Other specified diseases of intestine                           D12.8, Benign neoplasm of rectum                           D12.7, Benign neoplasm of rectosigmoid junction                           R10.32, Left lower quadrant pain                           K92.1, Melena (includes Hematochezia)                           R93.3, Abnormal findings on diagnostic imaging of                            other parts of digestive tract CPT copyright 2022 American Medical  Association. All rights reserved. The codes documented in this report are preliminary and upon coder review may  be revised to meet current compliance requirements. Sanjuan Dame, MD Sanjuan Dame, MD 11/02/2023 3:24:32 PM This report has been signed electronically. Number of Addenda: 0

## 2023-11-02 NOTE — Plan of Care (Signed)
  Problem: Education: Goal: Knowledge of General Education information will improve Description: Including pain rating scale, medication(s)/side effects and non-pharmacologic comfort measures Outcome: Progressing   Problem: Nutritional: Goal: Maintenance of adequate nutrition will improve Outcome: Progressing

## 2023-11-02 NOTE — Progress Notes (Signed)
Patient underwent Flexible sigmoidoscopy  under propofol sedation.  Tolerated the procedure adequately.   FINDINGS:  Impression:          - Preparation of the colon was fair.                            - Increased firmness of the prostate, boggy texture  of the prostate and rubbery texture of the prostate found on digital rectal exam.                            - Erythematous mucosa in the sigmoid colon. Biopsied.                            - Multiple 2 to 10 mm polyps in the rectum and at the recto-sigmoid colon. Resection not attempted given indication of the procedure                            -Submucosal rectal lesion lesion likely External Compression  Recommendation:             -Obtain MRI Pelvis given CT findings and these findings of external rectal compression -Complete colonoscopy with removal of polyps as outpatient  RECOMMENDATIONS   Thomas Lawman, MD Gastroenterology and Hepatology [facility] Gastroenterology

## 2023-11-02 NOTE — Plan of Care (Signed)
  Problem: Health Behavior/Discharge Planning: Goal: Ability to manage health-related needs will improve Outcome: Progressing   Problem: Elimination: Goal: Will not experience complications related to bowel motility Outcome: Progressing   Problem: Pain Management: Goal: General experience of comfort will improve Outcome: Progressing   Problem: Coping: Goal: Ability to adjust to condition or change in health will improve Outcome: Progressing   Problem: Nutritional: Goal: Maintenance of adequate nutrition will improve Outcome: Progressing

## 2023-11-02 NOTE — TOC CM/SW Note (Signed)
PCP list and SA treatment resource information added to pt's AVS

## 2023-11-02 NOTE — Interval H&P Note (Signed)
History and Physical Interval Note:  11/02/2023 2:42 PM  Thomas Donovan  has presented today for surgery, with the diagnosis of rectal bleeding and CT concerning for possible rectal mass.  The various methods of treatment have been discussed with the patient and family. After consideration of risks, benefits and other options for treatment, the patient has consented to  Procedure(s): FLEXIBLE SIGMOIDOSCOPY (N/A) as a surgical intervention.  The patient's history has been reviewed, patient examined, no change in status, stable for surgery.  I have reviewed the patient's chart and labs.  Questions were answered to the patient's satisfaction.    We will proceed with Flexible Sigmoidoscopy as scheduled.    I thoroughly discussed with the patient the procedure, including the risks involved. Patient understands what the procedure involves including the benefits and any risks. Patient understands alternatives to the proposed procedure. Risks including (but not limited to) bleeding, tearing of the lining (perforation), rupture of adjacent organs, problems with heart and lung function, infection, and medication reactions. A small percentage of complications may require surgery, hospitalization, repeat endoscopic procedure, and/or transfusion.  Patient understood and agreed.   Juanetta Beets Kaysea Raya

## 2023-11-02 NOTE — Progress Notes (Addendum)
Nurse at bedside second tap water enema given,patient tolerated procedure well.Patient did have a bowel movement after enema, watery stool with yellowish color noted. Plan of care on going.

## 2023-11-02 NOTE — Transfer of Care (Signed)
Immediate Anesthesia Transfer of Care Note  Patient: Thomas Donovan  Procedure(s) Performed: FLEXIBLE SIGMOIDOSCOPY BIOPSY  Patient Location: PACU  Anesthesia Type:General  Level of Consciousness: awake, alert , and oriented  Airway & Oxygen Therapy: Patient Spontanous Breathing  Post-op Assessment: Report given to RN and Post -op Vital signs reviewed and stable  Post vital signs: Reviewed and stable  Last Vitals:  Vitals Value Taken Time  BP 130/78 11/02/23 1510  Temp 97.5   Pulse 97 11/02/23 1512  Resp 28 11/02/23 1512  SpO2 98 % 11/02/23 1512  Vitals shown include unfiled device data.  Last Pain:  Vitals:   11/02/23 1447  TempSrc:   PainSc: 0-No pain      Patients Stated Pain Goal: 4 (11/02/23 1320)  Complications: No notable events documented.

## 2023-11-02 NOTE — Anesthesia Preprocedure Evaluation (Signed)
Anesthesia Evaluation  Patient identified by MRN, date of birth, ID band Patient awake    Reviewed: Allergy & Precautions, H&P , NPO status , Patient's Chart, lab work & pertinent test results, reviewed documented beta blocker date and time   Airway Mallampati: II  TM Distance: >3 FB Neck ROM: full    Dental no notable dental hx.    Pulmonary neg pulmonary ROS, shortness of breath, sleep apnea , pneumonia, Patient abstained from smoking., former smoker   Pulmonary exam normal breath sounds clear to auscultation       Cardiovascular Exercise Tolerance: Good hypertension, + CAD  negative cardio ROS  Rhythm:regular Rate:Normal     Neuro/Psych negative neurological ROS  negative psych ROS   GI/Hepatic negative GI ROS, Neg liver ROS,GERD  ,,  Endo/Other  negative endocrine ROSdiabetes    Renal/GU Renal diseasenegative Renal ROS  negative genitourinary   Musculoskeletal  (+) Arthritis ,    Abdominal   Peds negative pediatric ROS (+)  Hematology negative hematology ROS (+) Blood dyscrasia, anemia   Anesthesia Other Findings   Reproductive/Obstetrics negative OB ROS                             Anesthesia Physical Anesthesia Plan  ASA: 3  Anesthesia Plan: General   Post-op Pain Management:    Induction:   PONV Risk Score and Plan: Propofol infusion  Airway Management Planned:   Additional Equipment:   Intra-op Plan:   Post-operative Plan:   Informed Consent: I have reviewed the patients History and Physical, chart, labs and discussed the procedure including the risks, benefits and alternatives for the proposed anesthesia with the patient or authorized representative who has indicated his/her understanding and acceptance.     Dental Advisory Given  Plan Discussed with: CRNA  Anesthesia Plan Comments:        Anesthesia Quick Evaluation

## 2023-11-02 NOTE — Progress Notes (Addendum)
PROGRESS NOTE    Patient: Thomas Donovan                            PCP: Carmel Sacramento, NP                    DOB: 1961-07-05            DOA: 10/31/2023 BJY:[id number]             DOS: 11/02/2023, 4:06 PM   LOS: 1 day   Date of Service: The patient was seen and examined on 11/02/2023  Subjective:   The patient was seen and examined this morning, stable no acute distress Still complaining left lower quadrant abdominal pain Denies any further rectal bleed overnight N.p.o. in anticipation of sigmoidoscopy today   Brief Narrative:   Thomas Donovan is a 62 y.o. male with medical history significant of polysubstance abuse, diabetes mellitus type 2, GERD, hypertension, atrial fibrillation, COPD,  presents to the ED with a chief complaint of abdominal pain and blood in his stool.  Abdominal pain with episodes of diarrhea, took Motrin x 3.  Reporting bright red blood with diarrhea.  He reports his last drink was 4 days ago.  He reports that he no longer craves any cocaine or crack, but the alcohol cravings still get to him.  His last cocaine use was also 17-18 days ago.    Patient is on Eliquis for atrial fibrillation.  He reports his last dose was the morning of presentation, 10/31/2023.   CT noted a mass between his prostate and his rectum.  Patient was aware of that mass.  He wants to know if it is cancer.  We discussed the fact that it likely is a malignancy, of uncertain origin.  We discussed the possibility that could be prostate, rectal, or even peritoneal cancer.  Patient would like to see the oncologist while he is in the hospital.  We have discussed that cancer workup is usually several visits and includes a biopsy, but oncologist may be able to see him while he is here.   Patient does not smoke.     Assessment & Plan:   Principal Problem:   GI bleed Active Problems:   Hypokalemia   GERD without esophagitis   Acute on chronic blood loss anemia   Paroxysmal atrial  fibrillation with RVR (HCC)   Alcohol use   Mass in rectum   DMII (diabetes mellitus, type 2) (HCC)   GIB (gastrointestinal bleeding)     Assessment and Plan: * GI bleed    Latest Ref Rng & Units 11/01/2023   10:18 AM 11/01/2023    4:39 AM 10/31/2023   10:22 PM  CBC  WBC 4.0 - 10.5 K/uL 6.0  7.3  5.4   Hemoglobin 13.0 - 17.0 g/dL 9.1  9.3  9.1   Hematocrit 39.0 - 52.0 % 29.9  30.3  29.0   Platelets 150 - 400 K/uL 263  280  247     -Stable when compared to baseline -Check CBC q6 hours -Protonix BID -Plan for GI to do flex sig today 11/02/2023 -NPO  -Continue to monitor  DMII (diabetes mellitus, type 2) (HCC) -hold metformin and glipizide -Sliding scale coverage   Mass in rectum -Mass seen on CT - -pending GI evaluation -Ruling out malignant -S/P Sigmoidoscopy 11/02/2023: mildly erythematous mucosa was found in the sigmoid colon.       This was  biopsied with a cold forceps for histology.      There is no endoscopic evidence of mass in the rectum.      Multiple sessile polyps were found in the rectum and recto-sigmoid colon.       The polyps were 2 to 10 mm in size. Polypectomy was not attempted  Digital exam-enlarged prostate firm   GI following closely -Continue empiric antibiotics for possible diverticulitis currently on Rocephin/Flagyl>> once stable will switch to p.o. Cipro and Flagyl  -Discussed that workup for malignancy is usually done outpatient and may take several visits to complete  -Likely the etiology behind his pelvic pain -Continue pain control -Continue to monitor  Alcohol use -Last use 4-5 days ago -Used to drink heavily 1-2 pints of liquor per day -Reportedly cut way back on EtOH consumption 17-18 days ago -CIWA protocol -Naltrexone at discharge? -Continue to monitor  Paroxysmal atrial fibrillation with RVR (HCC) -Hold eliquis -Continue diltiazem -Monitoring closely  Acute on chronic blood loss anemia -Hgb has been as low as 6.6 in  08/2023 -Today Hgb 9.8 >>9.1  -Monitoring closely, -FOBT positive -GI following  GERD without esophagitis -Continue PPI  Hypokalemia -K+3.4 -Replace and recheck   ------------------------------------------------------------------------------------------------------------------------------------- Nutritional status:  The patient's BMI is: Body mass index is 36.08 kg/m. I agree with the assessment and plan as outlined below: Nutrition Status:      SkinDVT prophylaxis:  SCDs Start: 10/31/23 1630   Code Status:   Code Status: Full Code  Family Communication: Discussed with patient no family present at bedside  -Advance care planning has been discussed.   Admission status:   Status is: INPt  The patient will require care spanning > 2 midnights and should be moved to inpatient because:    Disposition: From  - home             Planning for discharge in 1-2 days: to   Procedures:   No admission procedures for hospital encounter.   Antimicrobials:  Anti-infectives (From admission, onward)    Start     Dose/Rate Route Frequency Ordered Stop   11/02/23 1700  metroNIDAZOLE (FLAGYL) tablet 500 mg        500 mg Oral Every 8 hours 11/02/23 1605     11/01/23 1500  cefTRIAXone (ROCEPHIN) 1 g in sodium chloride 0.9 % 100 mL IVPB  Status:  Discontinued        1 g 200 mL/hr over 30 Minutes Intravenous Every 24 hours 10/31/23 1629 10/31/23 1709   11/01/23 1500  cefTRIAXone (ROCEPHIN) 2 g in sodium chloride 0.9 % 100 mL IVPB        2 g 200 mL/hr over 30 Minutes Intravenous Every 24 hours 10/31/23 1709     10/31/23 1530  cefTRIAXone (ROCEPHIN) 2 g in sodium chloride 0.9 % 100 mL IVPB        2 g 200 mL/hr over 30 Minutes Intravenous  Once 10/31/23 1516 10/31/23 1640   10/31/23 1530  metroNIDAZOLE (FLAGYL) IVPB 500 mg        500 mg 100 mL/hr over 60 Minutes Intravenous  Once 10/31/23 1516 10/31/23 1906        Medication:   atorvastatin  40 mg Oral Daily   diltiazem  180  mg Oral Daily   fenofibrate  54 mg Oral Daily   finasteride  5 mg Oral Daily   fluticasone furoate-vilanterol  1 puff Inhalation Daily   And   umeclidinium bromide  1 puff Inhalation Daily  folic acid  1 mg Oral Daily   insulin aspart  0-15 Units Subcutaneous TID WC   insulin aspart  0-5 Units Subcutaneous QHS   metroNIDAZOLE  500 mg Oral Q8H   multivitamin with minerals  1 tablet Oral Daily   naltrexone  50 mg Oral Daily   pantoprazole (PROTONIX) IV  40 mg Intravenous Q12H   tamsulosin  0.4 mg Oral Daily   thiamine  100 mg Oral Daily   Or   thiamine  100 mg Intravenous Daily    HYDROmorphone (DILAUDID) injection, LORazepam **OR** LORazepam, ondansetron **OR** ondansetron (ZOFRAN) IV, oxyCODONE   Objective:   Vitals:   11/02/23 1530 11/02/23 1533 11/02/23 1538 11/02/23 1549  BP:   (!) 151/96 (!) 155/101  Pulse: 96 92 99 93  Resp: 18 (!) 24 18 19   Temp:    98.1 F (36.7 C)  TempSrc:    Oral  SpO2: 99% 98% 100% 99%  Weight:      Height:        Intake/Output Summary (Last 24 hours) at 11/02/2023 1606 Last data filed at 11/02/2023 1512 Gross per 24 hour  Intake 740 ml  Output 1300 ml  Net -560 ml   Filed Weights   10/31/23 1049  Weight: 120.7 kg     Physical examination:        General:  AAO x 3,  cooperative, no distress;   HEENT:  Normocephalic, PERRL, otherwise with in Normal limits   Neuro:  CNII-XII intact. , normal motor and sensation, reflexes intact   Lungs:   Clear to auscultation BL, Respirations unlabored,  No wheezes / crackles  Cardio:    S1/S2, RRR, No murmure, No Rubs or Gallops   Abdomen:  Soft, diffuse lower quadrant tenderness, bowel sounds active all four quadrants, no guarding or peritoneal signs.  Muscular  skeletal:  Limited exam -global generalized weaknesses - in bed, able to move all 4 extremities,   2+ pulses,  symmetric, No pitting edema  Skin:  Dry, warm to touch, negative for any Rashes,  Wounds: Please see nursing  documentation         ------------------------------------------------------------------------------------------------------------------------------------------    LABs:     Latest Ref Rng & Units 11/01/2023   10:18 AM 11/01/2023    4:39 AM 10/31/2023   10:22 PM  CBC  WBC 4.0 - 10.5 K/uL 6.0  7.3  5.4   Hemoglobin 13.0 - 17.0 g/dL 9.1  9.3  9.1   Hematocrit 39.0 - 52.0 % 29.9  30.3  29.0   Platelets 150 - 400 K/uL 263  280  247       Latest Ref Rng & Units 11/02/2023    4:47 AM 10/31/2023   11:08 AM 10/28/2023    4:34 AM  CMP  Glucose 70 - 99 mg/dL 161  096  045   BUN 8 - 23 mg/dL 9  27  40   Creatinine 0.61 - 1.24 mg/dL 4.09  8.11  9.14   Sodium 135 - 145 mmol/L 138  138  134   Potassium 3.5 - 5.1 mmol/L 3.0  3.4  3.7   Chloride 98 - 111 mmol/L 103  103  102   CO2 22 - 32 mmol/L 26  25  25    Calcium 8.9 - 10.3 mg/dL 9.0  8.8  8.8   Total Protein 6.5 - 8.1 g/dL 6.3  6.6    Total Bilirubin <1.2 mg/dL 0.4  0.3    Alkaline Phos 38 -  126 U/L 76  87    AST 15 - 41 U/L 18  21    ALT 0 - 44 U/L 20  25         Micro Results Recent Results (from the past 240 hour(s))  MRSA Next Gen by PCR, Nasal     Status: None   Collection Time: 10/26/23 11:45 PM   Specimen: Nasal Mucosa; Nasal Swab  Result Value Ref Range Status   MRSA by PCR Next Gen NOT DETECTED NOT DETECTED Final    Comment: (NOTE) The GeneXpert MRSA Assay (FDA approved for NASAL specimens only), is one component of a comprehensive MRSA colonization surveillance program. It is not intended to diagnose MRSA infection nor to guide or monitor treatment for MRSA infections. Test performance is not FDA approved in patients less than 39 years old. Performed at [facility], [address], [place], [place] [address]     Radiology Reports No results found.  SIGNED: Kendell Bane, MD, FHM. FAAFP. [name] - [facility] Time spent - 50 min.  In seeing, evaluating and examining the patient. Reviewing medical  records, labs, drawn plan of care. [facility],  Pager (please use [email or web address] to page/ text) Please use Epic Secure Chat for non-urgent communication (7AM-7PM)  If 7PM-7AM, please contact night-coverage [email or web address], 11/02/2023, 4:06 PM

## 2023-11-03 ENCOUNTER — Encounter (HOSPITAL_COMMUNITY): Payer: Self-pay | Admitting: Gastroenterology

## 2023-11-03 ENCOUNTER — Telehealth: Payer: Self-pay | Admitting: Gastroenterology

## 2023-11-03 DIAGNOSIS — K625 Hemorrhage of anus and rectum: Secondary | ICD-10-CM | POA: Diagnosis not present

## 2023-11-03 LAB — BASIC METABOLIC PANEL
Anion gap: 10 (ref 5–15)
BUN: 10 mg/dL (ref 8–23)
CO2: 24 mmol/L (ref 22–32)
Calcium: 9 mg/dL (ref 8.9–10.3)
Chloride: 102 mmol/L (ref 98–111)
Creatinine, Ser: 1.45 mg/dL — ABNORMAL HIGH (ref 0.61–1.24)
GFR, Estimated: 55 mL/min — ABNORMAL LOW (ref >60)
Glucose, Bld: 226 mg/dL — ABNORMAL HIGH (ref 70–99)
Potassium: 3.7 mmol/L (ref 3.5–5.1)
Sodium: 136 mmol/L (ref 135–145)

## 2023-11-03 LAB — CBC
HCT: 33 % — ABNORMAL LOW (ref 39.0–52.0)
Hemoglobin: 10.1 g/dL — ABNORMAL LOW (ref 13.0–17.0)
MCH: 25.3 pg — ABNORMAL LOW (ref 26.0–34.0)
MCHC: 30.6 g/dL (ref 30.0–36.0)
MCV: 82.5 fL (ref 80.0–100.0)
Platelets: 292 10*3/uL (ref 150–400)
RBC: 4 MIL/uL — ABNORMAL LOW (ref 4.22–5.81)
RDW: 18.6 % — ABNORMAL HIGH (ref 11.5–15.5)
WBC: 6.9 10*3/uL (ref 4.0–10.5)
nRBC: 0 % (ref 0.0–0.2)

## 2023-11-03 LAB — GLUCOSE, CAPILLARY: Glucose-Capillary: 170 mg/dL — ABNORMAL HIGH (ref 70–99)

## 2023-11-03 MED ORDER — NALTREXONE HCL 50 MG PO TABS: 50.0000 mg | ORAL_TABLET | Freq: Every day | ORAL | 1 refills | Status: AC

## 2023-11-03 MED ORDER — METHYLPREDNISOLONE SODIUM SUCC 40 MG IJ SOLR
40.0000 mg | Freq: Once | INTRAMUSCULAR | Status: AC
  Administered 2023-11-03: 40 mg via INTRAVENOUS
  Filled 2023-11-03: qty 1

## 2023-11-03 NOTE — Discharge Summary (Signed)
Physician Discharge Summary   Patient: Thomas Donovan MRN: [id number] DOB: May 13, 1961  Admit date:     10/31/2023  Discharge date: 11/03/23  Discharge Physician: Kendell Bane   PCP: Carmel [place], NP   Recommendations at discharge:   Follow-up with urologist Dr. Ronne Binning for further evaluation recommendation regarding enlarged prostate elevated PSA Follow with the finding of abdominal pelvic MRI Follow-up with PCP in 1-2 weeks Will hold your Eliquis for 24/48 hours prior to any procedure including biopsies  Discharge Diagnoses: Principal Problem:   GI bleed Active Problems:   Hypokalemia   GERD without esophagitis   Acute on chronic blood loss anemia   Paroxysmal atrial fibrillation with RVR (HCC)   Alcohol use   Mass in rectum   DMII (diabetes mellitus, type 2) (HCC)   GIB (gastrointestinal bleeding)   Grade II hemorrhoids  Resolved Problems:   * No resolved hospital problems. *  Hospital Course: Thomas Donovan is a 62 y.o. male with medical history significant of polysubstance abuse, diabetes mellitus type 2, GERD, hypertension, atrial fibrillation, COPD,  presents to the ED with a chief complaint of abdominal pain and blood in his stool.  Abdominal pain with episodes of diarrhea, took Motrin x 3.  Reporting bright red blood with diarrhea.  He reports his last drink was 4 days ago.  He reports that he no longer craves any cocaine or crack, but the alcohol cravings still get to him.  His last cocaine use was also 17-18 days ago.    Patient is on Eliquis for atrial fibrillation.  He reports his last dose was the morning of presentation, 10/31/2023.   CT noted a mass between his prostate and his rectum.  Patient was aware of that mass.  He wants to know if it is cancer.  We discussed the fact that it likely is a malignancy, of uncertain origin.  We discussed the possibility that could be prostate, rectal, or even peritoneal cancer.  Patient would like to see the  oncologist while he is in the hospital.  We have discussed that cancer workup is usually several visits and includes a biopsy, but oncologist may be able to see him while he is here.   Patient does not smoke.     : * GI bleed     Latest Ref Rng & Units 11/01/2023   10:18 AM 11/01/2023    4:39 AM 10/31/2023   10:22 PM  CBC  WBC 4.0 - 10.5 K/uL 6.0  7.3  5.4   Hemoglobin 13.0 - 17.0 g/dL 9.1  9.3  9.1   Hematocrit 39.0 - 52.0 % 29.9  30.3  29.0   Platelets 150 - 400 K/uL 263  280  247       -Stable when compared to baseline -Check CBC q6 hours -Protonix BID -Plan for GI to do flex sig today 11/02/2023 -NPO   -Continue to monitor   DMII (diabetes mellitus, type 2) (HCC) -hold metformin and glipizide -Sliding scale coverage     Mass in rectum -Mass seen on CT - -pending GI evaluation -Ruling out malignant -S/P Sigmoidoscopy 11/02/2023: mildly erythematous mucosa was found in the sigmoid colon.       This was biopsied with a cold forceps for histology.      There is no endoscopic evidence of mass in the rectum.      Multiple sessile polyps were found in the rectum and recto-sigmoid colon.       The polyps were  2 to 10 mm in size. Polypectomy was not attempted             Digital exam-enlarged prostate firm    -GI was following, recommended MRI of abdomen/pelvis -Was treated empirically for colitis with IV antibiotics-discontinued   -Discussed that workup for malignancy is usually done outpatient and may take several visits to complete   Enlarged prostate with elevated PSA 16.87  -Discussed with urologist patient is to follow-up as an outpatient    Alcohol use -Last use 4-5 days ago -Used to drink heavily 1-2 pints of liquor per day -Reportedly cut way back on EtOH consumption 17-18 days ago -CIWA protocol -Rx: Naltrexone at discharge    Paroxysmal atrial fibrillation with RVR (HCC) -Will resume Eliquis -Continue diltiazem    Acute on chronic blood loss  anemia -Hgb has been as low as 6.6 in 08/2023 -Today Hgb 9.8 >>9.1  -Monitoring closely, -FOBT positive -Status post sigmoidoscopy, negative's active signs of bleeding   GERD without esophagitis -Continue PPI   Hypokalemia -K+3.4 -Replace and recheck      Consultants: Gastroenterologist Procedures performed: Sigmoidoscopy Disposition: Home Diet recommendation:  Discharge Diet Orders (From admission, onward)     Start     Ordered   11/03/23 0000  Diet - low sodium heart healthy        11/03/23 1113   11/03/23 0000  Diet Carb Modified        11/03/23 1113           Carb modified diet DISCHARGE MEDICATION: Allergies as of 11/03/2023       Reactions   Celebrex [celecoxib] Anaphylaxis   Guaifenesin Anaphylaxis   Ibuprofen Other (See Comments), Hives   Reaction:  GI bleeding Other Reaction(s): OTC NSAIDS CAUSE BLOOD IN STOOLS   Tramadol Hives, Rash   Acetaminophen    Aspirin Other (See Comments)   Medication is contraindicated with pts gout medicine. Other Reaction(s): PT WAS TOLD NOT TO TAKE BY MD DUE TO BEING ON COLCHICINE.   Ketorolac    Other Reaction(s): HIVES, BLISTERS BTW FINGERS.   Toradol [ketorolac Tromethamine] Other (See Comments)   Reaction:  Blisters between fingers         Medication List     TAKE these medications    albuterol 108 (90 Base) MCG/ACT inhaler Commonly known as: VENTOLIN HFA Inhale 2 puffs into the lungs every 6 (six) hours as needed for wheezing or shortness of breath.   atorvastatin 40 MG tablet Commonly known as: LIPITOR Take 1 tablet (40 mg total) by mouth daily.   colchicine 0.6 MG tablet Take 1-2 tablets (0.6-1.2 mg total) by mouth 2 (two) times daily as needed (for gout flares).   diclofenac Sodium 1 % Gel Commonly known as: VOLTAREN Apply 2 g topically 4 (four) times daily as needed (back pain).   diltiazem 180 MG 24 hr capsule Commonly known as: CARDIZEM CD Take 1 capsule (180 mg total) by mouth daily.    Eliquis 5 MG Tabs tablet Generic drug: apixaban Take 1 tablet (5 mg total) by mouth 2 (two) times daily.   esomeprazole 40 MG capsule Commonly known as: NEXIUM Take 1 capsule (40 mg total) by mouth daily.   fenofibrate micronized 134 MG capsule Commonly known as: LOFIBRA Take 1 capsule (134 mg total) by mouth daily.   finasteride 5 MG tablet Commonly known as: PROSCAR Take 1 tablet (5 mg total) by mouth daily.   furosemide 40 MG tablet Commonly known as: LASIX Take 1  tablet (40 mg total) by mouth daily.   glipiZIDE 10 MG tablet Commonly known as: GLUCOTROL Take 2 tablets (20 mg total) by mouth daily.   metFORMIN 850 MG tablet Commonly known as: GLUCOPHAGE Take 1 tablet (850 mg total) by mouth every morning.   naltrexone 50 MG tablet Commonly known as: DEPADE Take 1 tablet (50 mg total) by mouth daily. For alcohol craving Start taking on: November 04, 2023   tamsulosin 0.4 MG Caps capsule Commonly known as: FLOMAX Take 1 capsule (0.4 mg total) by mouth daily.   Trelegy Ellipta 100-62.5-25 MCG/ACT Aepb Generic drug: Fluticasone-Umeclidin-Vilant Inhale 1 puff into the lungs daily.        Discharge Exam: Filed Weights   10/31/23 1049  Weight: 120.7 kg        General:  AAO x 3,  cooperative, no distress;   HEENT:  Normocephalic, PERRL, otherwise with in Normal limits   Neuro:  CNII-XII intact. , normal motor and sensation, reflexes intact   Lungs:   Clear to auscultation BL, Respirations unlabored,  No wheezes / crackles  Cardio:    S1/S2, RRR, No murmure, No Rubs or Gallops   Abdomen:  Soft, non-tender, bowel sounds active all four quadrants, no guarding or peritoneal signs.  Muscular  skeletal:  Limited exam -global generalized weaknesses - in bed, able to move all 4 extremities,   2+ pulses,  symmetric, No pitting edema  Skin:  Dry, warm to touch, negative for any Rashes,  Wounds: Please see nursing documentation          Condition at  discharge: good  The results of significant diagnostics from this hospitalization (including imaging, microbiology, ancillary and laboratory) are listed below for reference.   Imaging Studies: CT ANGIO ABDOMEN PELVIS  W & WO CONTRAST  Result Date: 10/31/2023 CLINICAL DATA:  Lower GI bleeding EXAM: CTA ABDOMEN AND PELVIS WITHOUT AND WITH CONTRAST TECHNIQUE: Multidetector CT imaging of the abdomen and pelvis was performed using the standard protocol during bolus administration of intravenous contrast. Multiplanar reconstructed images and MIPs were obtained and reviewed to evaluate the vascular anatomy. RADIATION DOSE REDUCTION: This exam was performed according to the departmental dose-optimization program which includes automated exposure control, adjustment of the mA and/or kV according to patient size and/or use of iterative reconstruction technique. CONTRAST:  OMNIPAQUE IOHEXOL 350 MG/ML SOLN COMPARISON:  CT scan of the abdomen and pelvis 10/03/2023; 05/18/2023 FINDINGS: VASCULAR Aorta: Normal caliber aorta without aneurysm, dissection, vasculitis or significant stenosis. Celiac: Patent without evidence of aneurysm, dissection, vasculitis or significant stenosis. SMA: The common hepatic artery is replaced to the SMA. No aneurysm, stenosis or dissection. Renals: Both renal arteries are patent without evidence of aneurysm, dissection, vasculitis, fibromuscular dysplasia or significant stenosis. IMA: Patent without evidence of aneurysm, dissection, vasculitis or significant stenosis. Inflow: Patent without evidence of aneurysm, dissection, vasculitis or significant stenosis. Slightly hypertrophic left middle rectal artery providing arterial hyperenhancement to the left hemorrhoidal cushion. Proximal Outflow: Bilateral common femoral and visualized portions of the superficial and profunda femoral arteries are patent without evidence of aneurysm, dissection, vasculitis or significant stenosis. Veins: No  obvious venous abnormality within the limitations of this arterial phase study. Review of the MIP images confirms the above findings. NON-VASCULAR Lower chest: Linear atelectasis versus scarring in the visualized dependent lower lobes. The heart is normal in size. No pericardial effusion. No acute abnormality. Hepatobiliary: Mildly nodular contour of the liver. Question early cirrhotic change. No discrete hepatic lesion. Small calcified gallstone in  the gallbladder lumen. No gallbladder distension, wall thickening or Peri cholecystic inflammation. Pancreas: Unremarkable. No pancreatic ductal dilatation or surrounding inflammatory changes. Spleen: Normal in size without focal abnormality. Adrenals/Urinary Tract: Normal adrenal glands. Stable punctate stone in the lower pole collecting system of the right kidney. No hydronephrosis, or enhancing renal lesion. Circumscribed water attenuation cystic lesions present bilaterally consistent with simple cysts. No imaging follow-up is recommended. The ureters and bladder are unremarkable. Stomach/Bowel: Stomach and duodenum are unremarkable. Normal appendix. Sigmoid colonic diverticulosis. There is a small amount of inflammatory stranding in the pericolonic fat with thickening of the retroperitoneum posteriorly. Similar findings have been seen across several prior studies and may be chronic in nature. Slight asymmetric wall thickening of the left rectum and recto anal junction. There is subtle extravasation of contrast material which appears to be emanating from the left hemorrhoidal cushion. Lymphatic: No suspicious lymphadenopathy. Reproductive: Enlarging ovoid soft tissue nodule posterior to the left prostate gland exerting local mass effect on the adjacent rectum. The abnormality measures approximately 2.6 cm today compared to 2.4 cm in July. Other: No significant abdominal ascites.  No abdominal wall hernia. Musculoskeletal: Surgical changes of prior L5-S1 posterior  lumbosacral interbody fusion with interbody graft. Multilevel degenerative disc disease and facet arthropathy. IMPRESSION: VASCULAR 1. Positive for hyperenhancement of the left hemorrhoidal cushion with probable contrast extravasation (active bleeding) from the hemorrhoid. There is a direct feeding artery from a hypertrophic left middle rectal artery arising from the anterior division of the left internal iliac artery. If hemostasis cannot be obtained manually, or endoscopically, catheter directed embolization would likely be possible. 2. No evidence of arterial aneurysm, dissection or significant atherosclerotic plaque. NON-VASCULAR 1. Enlarging soft tissue nodule position between the left aspect of the prostate gland and the rectum with local mass effect on the rectum. Differential considerations include prostate cancer versus exophytic rectal malignancy, GIST tumor or potentially a primary peritoneal lesion. This is been described on prior imaging and gadolinium-enhanced prostate MRI was previously suggested for further evaluation. In addition to that imaging suggestion, correlation with serum PSA and digital rectal examination is also recommended. 2. Sigmoid diverticulosis with some chronic stranding and peritoneal thickening. Smoldering diverticulitis is a consideration. 3. Questionable hepatic cirrhosis. No ascites on today's examination. 4. Additional ancillary findings as above without significant interval change. Electronically Signed   By: Malachy Moan M.D.   On: 10/31/2023 14:37   NM Pulmonary Perfusion  Result Date: 10/27/2023 CLINICAL DATA:  PE suspected.  Positive D-dimer EXAM: NUCLEAR MEDICINE PERFUSION LUNG SCAN TECHNIQUE: Perfusion images were obtained in multiple projections after intravenous injection of radiopharmaceutical. RADIOPHARMACEUTICALS:  4.3 mCi Tc-54m MAA COMPARISON:  None Available. FINDINGS: No wedge-shaped peripheral perfusion defect within LEFT or RIGHT lung to suggest  acute pulmonary embolism. Normal perfusion pattern. IMPRESSION: No evidence acute pulmonary embolism. Electronically Signed   By: Genevive Bi M.D.   On: 10/27/2023 16:32   DG CHEST PORT 1 VIEW  Result Date: 10/27/2023 CLINICAL DATA:  Dyspnea. EXAM: PORTABLE CHEST 1 VIEW COMPARISON:  December 23, 2023. FINDINGS: The heart size and mediastinal contours are within normal limits. Both lungs are clear. The visualized skeletal structures are unremarkable. IMPRESSION: No active disease. Electronically Signed   By: Lupita Raider M.D.   On: 10/27/2023 16:31    Microbiology: Results for orders placed or performed during the hospital encounter of 10/26/23  MRSA Next Gen by PCR, Nasal     Status: None   Collection Time: 10/26/23 11:45 PM   Specimen:  Nasal Mucosa; Nasal Swab  Result Value Ref Range Status   MRSA by PCR Next Gen NOT DETECTED NOT DETECTED Final    Comment: (NOTE) The GeneXpert MRSA Assay (FDA approved for NASAL specimens only), is one component of a comprehensive MRSA colonization surveillance program. It is not intended to diagnose MRSA infection nor to guide or monitor treatment for MRSA infections. Test performance is not FDA approved in patients less than 6 years old. Performed at [facility], [address], [place], [place] [address]     Labs: CBC: Recent Labs  Lab 10/31/23 1702 10/31/23 2222 11/01/23 0439 11/01/23 1018 11/03/23 0841  WBC 5.3 5.4 7.3 6.0 6.9  HGB 9.3* 9.1* 9.3* 9.1* 10.1*  HCT 30.0* 29.0* 30.3* 29.9* 33.0*  MCV 83.3 81.0 81.2 81.7 82.5  PLT 284 247 280 263 292   Basic Metabolic Panel: Recent Labs  Lab 10/28/23 0434 10/31/23 1108 11/02/23 0447 11/03/23 0841  NA 134* 138 138 136  K 3.7 3.4* 3.0* 3.7  CL 102 103 103 102  CO2 25 25 26 24   GLUCOSE 194* 191* 144* 226*  BUN 40* 27* 9 10  CREATININE 1.85* 1.64* 1.29* 1.45*  CALCIUM 8.8* 8.8* 9.0 9.0  MG 1.7  --   --   --    Liver Function Tests: Recent Labs  Lab 10/31/23 1108  11/02/23 0447  AST 21 18  ALT 25 20  ALKPHOS 87 76  BILITOT 0.3 0.4  PROT 6.6 6.3*  ALBUMIN 3.1* 3.0*   CBG: Recent Labs  Lab 11/02/23 1304 11/02/23 1533 11/02/23 1636 11/02/23 2015 11/03/23 0738  GLUCAP 119* 100* 129* 218* 170*    Discharge time spent: greater than 30 minutes.  Signed: Kendell Bane, MD [facility] 11/03/2023

## 2023-11-03 NOTE — Progress Notes (Addendum)
Chart reviewed and discussed with Dr. Haroldine Laws and Dr. Levon Hedger briefly.   Hemoglobin stable at 10.1 today.  Per nursing patient requesting discharge.  After discussion with Dr. Latrelle Dodrill he has spoken with urology, Dr. Ronne Binning who has stated that he will arrange for an outpatient biopsy.   Recommendations: Okay to resume anticoagulation and monitor for recurrent bleeding If recurrent rectal bleeding then may need IR consultation for possible embolization of supply to left hemorrhoidal cushion MRI pelvis outpatient Continue 10 day course of cipro/flagyl Outpatient follow up to schedule complete colonoscopy/EGD and outpatient cirrhosis care, we will arrange.   Brooke Bonito, MSN, FNP-BC, AGACNP-BC [facility]

## 2023-11-03 NOTE — Telephone Encounter (Signed)
Please arrange hospital follow-up for anemia, cirrhosis, and prostate/rectal nodule in 2-3 weeks with Lewie Loron or Ermalinda Memos.

## 2023-11-03 NOTE — Care Management Important Message (Signed)
Important Message  Patient Details  Name: Thomas Donovan MRN: [id number] Date of Birth: 1961/12/04   Important Message Given:  Yes - Medicare IM     Corey Harold 11/03/2023, 11:42 AM

## 2023-11-04 NOTE — Anesthesia Postprocedure Evaluation (Signed)
Anesthesia Post Note  Patient: Thomas Donovan  Procedure(s) Performed: FLEXIBLE SIGMOIDOSCOPY BIOPSY  Patient location during evaluation: Phase II Anesthesia Type: General Level of consciousness: awake Pain management: pain level controlled Vital Signs Assessment: post-procedure vital signs reviewed and stable Respiratory status: spontaneous breathing and respiratory function stable Cardiovascular status: blood pressure returned to baseline and stable Postop Assessment: no headache and no apparent nausea or vomiting Anesthetic complications: no Comments: Late entry   No notable events documented.   Last Vitals:  Vitals:   11/03/23 0357 11/03/23 0731  BP: (!) 141/78   Pulse: 99   Resp: 18   Temp: 36.6 C   SpO2: 99% 99%    Last Pain:  Vitals:   11/03/23 0900  TempSrc:   PainSc: 8                  Windell Norfolk

## 2023-11-06 LAB — SURGICAL PATHOLOGY

## 2023-11-07 NOTE — Progress Notes (Signed)
I reviewed the pathology results. Ann, can you send her a letter with the findings as described below please?  Thanks,  Vista Lawman, MD Gastroenterology and Hepatology [facility]  ---------------------------------------------------------------------------------------------  [facility] [address], [place], [place] [address] Phone:  [phone]   11/07/23 [place], [place]   Dear Thomas Donovan,  I am writing to inform you that the biopsies taken during your recent endoscopic examination showed:  Normal biopsies of the colon .  I recommend you follow up with [place] in the GI clinic to discuss complete colonoscopy with removal of the polyps which were seen in the flexible sigmoidoscopy . Also for management of possible cirrhosis   Please call us at [phone] if you have persistent problems or have questions about your condition that have not been fully answered at this time.  Sincerely,  Vista Lawman, MD Gastroenterology and Hepatology

## 2023-11-08 NOTE — Telephone Encounter (Signed)
Called patient but the call couldn't go thru as dialed.

## 2023-11-09 ENCOUNTER — Encounter (INDEPENDENT_AMBULATORY_CARE_PROVIDER_SITE_OTHER): Payer: Self-pay | Admitting: *Deleted

## 2023-11-10 ENCOUNTER — Encounter: Payer: Self-pay | Admitting: Gastroenterology

## 2023-11-10 NOTE — Telephone Encounter (Signed)
Mailed patient a letter asking him to contact our office to schedule an appt.  Could never get the call to go through.

## 2023-11-23 ENCOUNTER — Other Ambulatory Visit: Payer: Self-pay

## 2023-11-23 ENCOUNTER — Emergency Department (HOSPITAL_COMMUNITY): Payer: Medicare HMO

## 2023-11-23 ENCOUNTER — Emergency Department (HOSPITAL_COMMUNITY)
Admission: EM | Admit: 2023-11-23 | Discharge: 2023-11-24 | Disposition: A | Discharge status: Home / Self Care | Payer: Medicare HMO | Attending: Emergency Medicine | Admitting: Emergency Medicine

## 2023-11-23 ENCOUNTER — Encounter (HOSPITAL_COMMUNITY): Payer: Self-pay | Admitting: Emergency Medicine

## 2023-11-23 DIAGNOSIS — R079 Chest pain, unspecified: Secondary | ICD-10-CM | POA: Diagnosis not present

## 2023-11-23 DIAGNOSIS — E119 Type 2 diabetes mellitus without complications: Secondary | ICD-10-CM | POA: Insufficient documentation

## 2023-11-23 DIAGNOSIS — R0602 Shortness of breath: Secondary | ICD-10-CM | POA: Insufficient documentation

## 2023-11-23 DIAGNOSIS — I119 Hypertensive heart disease without heart failure: Secondary | ICD-10-CM | POA: Insufficient documentation

## 2023-11-23 DIAGNOSIS — R002 Palpitations: Secondary | ICD-10-CM | POA: Insufficient documentation

## 2023-11-23 DIAGNOSIS — Z7984 Long term (current) use of oral hypoglycemic drugs: Secondary | ICD-10-CM | POA: Diagnosis not present

## 2023-11-23 DIAGNOSIS — Z7901 Long term (current) use of anticoagulants: Secondary | ICD-10-CM | POA: Diagnosis not present

## 2023-11-23 DIAGNOSIS — I10 Essential (primary) hypertension: Secondary | ICD-10-CM | POA: Insufficient documentation

## 2023-11-23 DIAGNOSIS — I4891 Unspecified atrial fibrillation: Secondary | ICD-10-CM | POA: Insufficient documentation

## 2023-11-23 LAB — RAPID URINE DRUG SCREEN, HOSP PERFORMED
Amphetamines: NOT DETECTED
Barbiturates: NOT DETECTED
Benzodiazepines: POSITIVE — AB
Cocaine: POSITIVE — AB
Opiates: NOT DETECTED
Tetrahydrocannabinol: NOT DETECTED

## 2023-11-23 LAB — CBC WITH DIFFERENTIAL/PLATELET
Abs Immature Granulocytes: 0.04 10*3/uL (ref 0.00–0.07)
Basophils Absolute: 0 10*3/uL (ref 0.0–0.1)
Basophils Relative: 1 %
Eosinophils Absolute: 0.3 10*3/uL (ref 0.0–0.5)
Eosinophils Relative: 4 %
HCT: 29.5 % — ABNORMAL LOW (ref 39.0–52.0)
Hemoglobin: 9.4 g/dL — ABNORMAL LOW (ref 13.0–17.0)
Immature Granulocytes: 1 %
Lymphocytes Relative: 11 %
Lymphs Abs: 0.8 10*3/uL (ref 0.7–4.0)
MCH: 26 pg (ref 26.0–34.0)
MCHC: 31.9 g/dL (ref 30.0–36.0)
MCV: 81.5 fL (ref 80.0–100.0)
Monocytes Absolute: 0.5 10*3/uL (ref 0.1–1.0)
Monocytes Relative: 6 %
Neutro Abs: 5.9 10*3/uL (ref 1.7–7.7)
Neutrophils Relative %: 77 %
Platelets: 266 10*3/uL (ref 150–400)
RBC: 3.62 MIL/uL — ABNORMAL LOW (ref 4.22–5.81)
RDW: 18.6 % — ABNORMAL HIGH (ref 11.5–15.5)
WBC: 7.6 10*3/uL (ref 4.0–10.5)
nRBC: 0 % (ref 0.0–0.2)

## 2023-11-23 LAB — COMPREHENSIVE METABOLIC PANEL
ALT: 21 U/L (ref 0–44)
AST: 25 U/L (ref 15–41)
Albumin: 3.7 g/dL (ref 3.5–5.0)
Alkaline Phosphatase: 74 U/L (ref 38–126)
Anion gap: 11 (ref 5–15)
BUN: 24 mg/dL — ABNORMAL HIGH (ref 8–23)
CO2: 22 mmol/L (ref 22–32)
Calcium: 8.8 mg/dL — ABNORMAL LOW (ref 8.9–10.3)
Chloride: 103 mmol/L (ref 98–111)
Creatinine, Ser: 1.62 mg/dL — ABNORMAL HIGH (ref 0.61–1.24)
GFR, Estimated: 48 mL/min — ABNORMAL LOW (ref >60)
Glucose, Bld: 193 mg/dL — ABNORMAL HIGH (ref 70–99)
Potassium: 4.1 mmol/L (ref 3.5–5.1)
Sodium: 136 mmol/L (ref 135–145)
Total Bilirubin: 0.6 mg/dL (ref <1.2)
Total Protein: 6.7 g/dL (ref 6.5–8.1)

## 2023-11-23 LAB — TROPONIN I (HIGH SENSITIVITY)
Troponin I (High Sensitivity): 8 ng/L (ref <18)
Troponin I (High Sensitivity): 7 ng/L (ref <18)

## 2023-11-23 MED ORDER — LORAZEPAM 1 MG PO TABS
1.0000 mg | ORAL_TABLET | Freq: Once | ORAL | Status: AC
  Administered 2023-11-23: 1 mg via ORAL
  Filled 2023-11-23: qty 1

## 2023-11-23 MED ORDER — MORPHINE SULFATE (PF) 4 MG/ML IV SOLN
4.0000 mg | Freq: Once | INTRAVENOUS | Status: AC
  Administered 2023-11-23: 4 mg via INTRAVENOUS
  Filled 2023-11-23: qty 1

## 2023-11-23 MED ORDER — SODIUM CHLORIDE 0.9 % IV BOLUS
1000.0000 mL | Freq: Once | INTRAVENOUS | Status: AC
  Administered 2023-11-23: 1000 mL via INTRAVENOUS

## 2023-11-23 MED ORDER — LORAZEPAM 1 MG PO TABS
1.0000 mg | ORAL_TABLET | Freq: Once | ORAL | Status: AC
  Administered 2023-11-23: 1 mg via ORAL
  Filled 2023-11-23 (×2): qty 1

## 2023-11-23 MED ORDER — DILTIAZEM HCL 25 MG/5ML IV SOLN
10.0000 mg | Freq: Once | INTRAVENOUS | Status: AC
  Administered 2023-11-23: 10 mg via INTRAVENOUS
  Filled 2023-11-23: qty 5

## 2023-11-23 MED ORDER — DILTIAZEM HCL ER COATED BEADS 180 MG PO CP24
180.0000 mg | ORAL_CAPSULE | Freq: Once | ORAL | Status: AC
  Administered 2023-11-23: 180 mg via ORAL
  Filled 2023-11-23: qty 1

## 2023-11-23 MED ORDER — SODIUM CHLORIDE 0.9 % IV BOLUS
500.0000 mL | Freq: Once | INTRAVENOUS | Status: AC
  Administered 2023-11-23: 500 mL via INTRAVENOUS

## 2023-11-23 NOTE — ED Triage Notes (Signed)
Pt c/o palpitations and sob since 1030am.
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
## 2023-11-23 NOTE — Discharge Instructions (Signed)
You were seen in the emerged part for chest pain and palpitations Most likely cause of your symptoms today would be not taking your medication as prescribed and cocaine use You did test positive for cocaine It does not appear as though you are having a heart attack today We watched you for several hours and gave you a dose of your home Cardizem Continue taking all medications as previously prescribed Return to the Emergency Department for severe chest pain trouble breathing or any other concerns Refrain from cocaine and other drug use Follow-up with your primary care doctor in 1 week for reevaluation

## 2023-11-24 MED ORDER — COLCHICINE 0.6 MG PO TABS: 0.6000 mg | ORAL_TABLET | Freq: Two times a day (BID) | ORAL | 0 refills | Status: DC | PRN

## 2023-11-24 MED ORDER — METFORMIN HCL 850 MG PO TABS: 850.0000 mg | ORAL_TABLET | Freq: Every morning | ORAL | 0 refills | Status: DC

## 2023-11-24 MED ORDER — ALBUTEROL SULFATE HFA 108 (90 BASE) MCG/ACT IN AERS: 2.0000 | INHALATION_SPRAY | Freq: Four times a day (QID) | RESPIRATORY_TRACT | 0 refills | Status: DC | PRN

## 2023-11-24 MED ORDER — DILTIAZEM HCL ER COATED BEADS 180 MG PO CP24: 180.0000 mg | ORAL_CAPSULE | Freq: Every day | ORAL | 0 refills | Status: DC

## 2023-11-28 ENCOUNTER — Observation Stay (HOSPITAL_COMMUNITY)
Admission: EM | Admit: 2023-11-28 | Discharge: 2023-11-30 | DRG: 308 | Disposition: A | Discharge status: Home / Self Care | Payer: Medicare HMO | Attending: Internal Medicine | Admitting: Internal Medicine

## 2023-11-28 ENCOUNTER — Emergency Department (HOSPITAL_COMMUNITY): Payer: Medicare HMO

## 2023-11-28 ENCOUNTER — Other Ambulatory Visit: Payer: Self-pay

## 2023-11-28 ENCOUNTER — Encounter (HOSPITAL_COMMUNITY): Payer: Self-pay | Admitting: Emergency Medicine

## 2023-11-28 DIAGNOSIS — I2510 Atherosclerotic heart disease of native coronary artery without angina pectoris: Secondary | ICD-10-CM | POA: Diagnosis not present

## 2023-11-28 DIAGNOSIS — I129 Hypertensive chronic kidney disease with stage 1 through stage 4 chronic kidney disease, or unspecified chronic kidney disease: Secondary | ICD-10-CM | POA: Diagnosis not present

## 2023-11-28 DIAGNOSIS — I480 Paroxysmal atrial fibrillation: Secondary | ICD-10-CM | POA: Diagnosis not present

## 2023-11-28 DIAGNOSIS — F1410 Cocaine abuse, uncomplicated: Secondary | ICD-10-CM | POA: Diagnosis present

## 2023-11-28 DIAGNOSIS — Z8546 Personal history of malignant neoplasm of prostate: Secondary | ICD-10-CM

## 2023-11-28 DIAGNOSIS — Z720 Tobacco use: Secondary | ICD-10-CM | POA: Diagnosis not present

## 2023-11-28 DIAGNOSIS — I4891 Unspecified atrial fibrillation: Secondary | ICD-10-CM | POA: Diagnosis present

## 2023-11-28 DIAGNOSIS — K219 Gastro-esophageal reflux disease without esophagitis: Secondary | ICD-10-CM | POA: Diagnosis present

## 2023-11-28 DIAGNOSIS — Z79899 Other long term (current) drug therapy: Secondary | ICD-10-CM

## 2023-11-28 DIAGNOSIS — E782 Mixed hyperlipidemia: Secondary | ICD-10-CM | POA: Diagnosis not present

## 2023-11-28 DIAGNOSIS — R002 Palpitations: Secondary | ICD-10-CM | POA: Diagnosis present

## 2023-11-28 DIAGNOSIS — Z7901 Long term (current) use of anticoagulants: Secondary | ICD-10-CM

## 2023-11-28 DIAGNOSIS — N1831 Chronic kidney disease, stage 3a: Secondary | ICD-10-CM | POA: Diagnosis not present

## 2023-11-28 DIAGNOSIS — I130 Hypertensive heart and chronic kidney disease with heart failure and stage 1 through stage 4 chronic kidney disease, or unspecified chronic kidney disease: Secondary | ICD-10-CM | POA: Diagnosis present

## 2023-11-28 DIAGNOSIS — Z6836 Body mass index (BMI) 36.0-36.9, adult: Secondary | ICD-10-CM

## 2023-11-28 DIAGNOSIS — J449 Chronic obstructive pulmonary disease, unspecified: Secondary | ICD-10-CM | POA: Diagnosis present

## 2023-11-28 DIAGNOSIS — E66812 Obesity, class 2: Secondary | ICD-10-CM | POA: Diagnosis not present

## 2023-11-28 DIAGNOSIS — Z87442 Personal history of urinary calculi: Secondary | ICD-10-CM

## 2023-11-28 DIAGNOSIS — Z7984 Long term (current) use of oral hypoglycemic drugs: Secondary | ICD-10-CM

## 2023-11-28 DIAGNOSIS — R079 Chest pain, unspecified: Secondary | ICD-10-CM | POA: Diagnosis not present

## 2023-11-28 DIAGNOSIS — E1122 Type 2 diabetes mellitus with diabetic chronic kidney disease: Secondary | ICD-10-CM | POA: Diagnosis present

## 2023-11-28 DIAGNOSIS — G4733 Obstructive sleep apnea (adult) (pediatric): Secondary | ICD-10-CM | POA: Diagnosis present

## 2023-11-28 DIAGNOSIS — M549 Dorsalgia, unspecified: Secondary | ICD-10-CM | POA: Diagnosis present

## 2023-11-28 DIAGNOSIS — Z794 Long term (current) use of insulin: Secondary | ICD-10-CM

## 2023-11-28 DIAGNOSIS — Z885 Allergy status to narcotic agent status: Secondary | ICD-10-CM

## 2023-11-28 DIAGNOSIS — Z6835 Body mass index (BMI) 35.0-35.9, adult: Secondary | ICD-10-CM | POA: Diagnosis not present

## 2023-11-28 DIAGNOSIS — K7030 Alcoholic cirrhosis of liver without ascites: Secondary | ICD-10-CM | POA: Diagnosis present

## 2023-11-28 DIAGNOSIS — F1010 Alcohol abuse, uncomplicated: Secondary | ICD-10-CM | POA: Diagnosis present

## 2023-11-28 DIAGNOSIS — F17210 Nicotine dependence, cigarettes, uncomplicated: Secondary | ICD-10-CM | POA: Diagnosis present

## 2023-11-28 DIAGNOSIS — M109 Gout, unspecified: Secondary | ICD-10-CM | POA: Diagnosis present

## 2023-11-28 DIAGNOSIS — Z888 Allergy status to other drugs, medicaments and biological substances status: Secondary | ICD-10-CM

## 2023-11-28 DIAGNOSIS — Z886 Allergy status to analgesic agent status: Secondary | ICD-10-CM

## 2023-11-28 DIAGNOSIS — Z9841 Cataract extraction status, right eye: Secondary | ICD-10-CM

## 2023-11-28 DIAGNOSIS — R0789 Other chest pain: Secondary | ICD-10-CM | POA: Diagnosis present

## 2023-11-28 DIAGNOSIS — I5033 Acute on chronic diastolic (congestive) heart failure: Secondary | ICD-10-CM | POA: Diagnosis present

## 2023-11-28 DIAGNOSIS — E669 Obesity, unspecified: Secondary | ICD-10-CM | POA: Diagnosis present

## 2023-11-28 DIAGNOSIS — G8929 Other chronic pain: Secondary | ICD-10-CM | POA: Diagnosis present

## 2023-11-28 DIAGNOSIS — M51369 Other intervertebral disc degeneration, lumbar region without mention of lumbar back pain or lower extremity pain: Secondary | ICD-10-CM | POA: Diagnosis present

## 2023-11-28 DIAGNOSIS — R972 Elevated prostate specific antigen [PSA]: Secondary | ICD-10-CM | POA: Diagnosis present

## 2023-11-28 DIAGNOSIS — E1165 Type 2 diabetes mellitus with hyperglycemia: Secondary | ICD-10-CM | POA: Diagnosis not present

## 2023-11-28 DIAGNOSIS — Z91148 Patient's other noncompliance with medication regimen for other reason: Secondary | ICD-10-CM

## 2023-11-28 LAB — COMPREHENSIVE METABOLIC PANEL
ALT: 17 U/L (ref 0–44)
AST: 19 U/L (ref 15–41)
Albumin: 3.8 g/dL (ref 3.5–5.0)
Alkaline Phosphatase: 83 U/L (ref 38–126)
Anion gap: 11 (ref 5–15)
BUN: 25 mg/dL — ABNORMAL HIGH (ref 8–23)
CO2: 26 mmol/L (ref 22–32)
Calcium: 9.5 mg/dL (ref 8.9–10.3)
Chloride: 100 mmol/L (ref 98–111)
Creatinine, Ser: 1.44 mg/dL — ABNORMAL HIGH (ref 0.61–1.24)
GFR, Estimated: 55 mL/min — ABNORMAL LOW (ref >60)
Glucose, Bld: 185 mg/dL — ABNORMAL HIGH (ref 70–99)
Potassium: 4.9 mmol/L (ref 3.5–5.1)
Sodium: 137 mmol/L (ref 135–145)
Total Bilirubin: 0.5 mg/dL (ref <1.2)
Total Protein: 7 g/dL (ref 6.5–8.1)

## 2023-11-28 LAB — CBC WITH DIFFERENTIAL/PLATELET
Abs Immature Granulocytes: 0.04 10*3/uL (ref 0.00–0.07)
Basophils Absolute: 0.1 10*3/uL (ref 0.0–0.1)
Basophils Relative: 1 %
Eosinophils Absolute: 0.3 10*3/uL (ref 0.0–0.5)
Eosinophils Relative: 4 %
HCT: 29.6 % — ABNORMAL LOW (ref 39.0–52.0)
Hemoglobin: 9.4 g/dL — ABNORMAL LOW (ref 13.0–17.0)
Immature Granulocytes: 1 %
Lymphocytes Relative: 13 %
Lymphs Abs: 1 10*3/uL (ref 0.7–4.0)
MCH: 26.6 pg (ref 26.0–34.0)
MCHC: 31.8 g/dL (ref 30.0–36.0)
MCV: 83.6 fL (ref 80.0–100.0)
Monocytes Absolute: 0.7 10*3/uL (ref 0.1–1.0)
Monocytes Relative: 8 %
Neutro Abs: 5.8 10*3/uL (ref 1.7–7.7)
Neutrophils Relative %: 73 %
Platelets: 272 10*3/uL (ref 150–400)
RBC: 3.54 MIL/uL — ABNORMAL LOW (ref 4.22–5.81)
RDW: 18.6 % — ABNORMAL HIGH (ref 11.5–15.5)
WBC: 7.9 10*3/uL (ref 4.0–10.5)
nRBC: 0 % (ref 0.0–0.2)

## 2023-11-28 LAB — RAPID URINE DRUG SCREEN, HOSP PERFORMED
Amphetamines: NOT DETECTED
Barbiturates: NOT DETECTED
Benzodiazepines: NOT DETECTED
Cocaine: NOT DETECTED
Opiates: POSITIVE — AB
Tetrahydrocannabinol: NOT DETECTED

## 2023-11-28 LAB — BRAIN NATRIURETIC PEPTIDE: B Natriuretic Peptide: 197 pg/mL — ABNORMAL HIGH (ref 0.0–100.0)

## 2023-11-28 LAB — TSH: TSH: 2.446 u[IU]/mL (ref 0.350–4.500)

## 2023-11-28 LAB — URINALYSIS, W/ REFLEX TO CULTURE (INFECTION SUSPECTED)
Bacteria, UA: NONE SEEN
Bilirubin Urine: NEGATIVE
Glucose, UA: 50 mg/dL — AB
Hgb urine dipstick: NEGATIVE
Ketones, ur: NEGATIVE mg/dL
Leukocytes,Ua: NEGATIVE
Nitrite: NEGATIVE
Protein, ur: 30 mg/dL — AB
Specific Gravity, Urine: 1.008 (ref 1.005–1.030)
pH: 6 (ref 5.0–8.0)

## 2023-11-28 LAB — TROPONIN I (HIGH SENSITIVITY)
Troponin I (High Sensitivity): 8 ng/L (ref <18)
Troponin I (High Sensitivity): 8 ng/L (ref <18)

## 2023-11-28 MED ORDER — DILTIAZEM HCL 50 MG/10ML IV SOLN: 10.0000 mg | Freq: Once | INTRAVENOUS | Status: DC

## 2023-11-28 MED ORDER — CHLORHEXIDINE GLUCONATE CLOTH 2 % EX PADS
6.0000 | MEDICATED_PAD | Freq: Every day | CUTANEOUS | Status: DC
  Administered 2023-11-30: 6 via TOPICAL

## 2023-11-28 MED ORDER — FENTANYL CITRATE (PF) 100 MCG/2ML IJ SOLN
100.0000 ug | Freq: Once | INTRAMUSCULAR | Status: AC
  Administered 2023-11-28: 100 ug via INTRAVENOUS
  Filled 2023-11-28: qty 2

## 2023-11-28 MED ORDER — OXYCODONE HCL 5 MG PO TABS
5.0000 mg | ORAL_TABLET | Freq: Once | ORAL | Status: AC | PRN
  Administered 2023-11-28: 5 mg via ORAL
  Filled 2023-11-28: qty 1

## 2023-11-28 MED ORDER — LORAZEPAM 1 MG PO TABS: 1.0000 mg | ORAL_TABLET | ORAL | Status: DC | PRN

## 2023-11-28 MED ORDER — DILTIAZEM HCL 25 MG/5ML IV SOLN
10.0000 mg | Freq: Once | INTRAVENOUS | Status: AC
Start: 2023-11-28 — End: 2023-11-28
  Administered 2023-11-28: 10 mg via INTRAVENOUS
  Filled 2023-11-28: qty 5

## 2023-11-28 MED ORDER — DIPHENHYDRAMINE HCL 50 MG/ML IJ SOLN
25.0000 mg | Freq: Once | INTRAMUSCULAR | Status: AC
  Administered 2023-11-28: 25 mg via INTRAVENOUS
  Filled 2023-11-28: qty 1

## 2023-11-28 MED ORDER — THIAMINE HCL 100 MG/ML IJ SOLN
100.0000 mg | Freq: Every day | INTRAMUSCULAR | Status: DC
  Filled 2023-11-28: qty 2

## 2023-11-28 MED ORDER — FOLIC ACID 1 MG PO TABS
1.0000 mg | ORAL_TABLET | Freq: Every day | ORAL | Status: DC
  Administered 2023-11-28 – 2023-11-30 (×3): 1 mg via ORAL
  Filled 2023-11-28 (×3): qty 1

## 2023-11-28 MED ORDER — LORAZEPAM 2 MG/ML IJ SOLN
1.0000 mg | Freq: Once | INTRAMUSCULAR | Status: AC
  Administered 2023-11-28: 1 mg via INTRAVENOUS
  Filled 2023-11-28: qty 1

## 2023-11-28 MED ORDER — TAMSULOSIN HCL 0.4 MG PO CAPS
0.4000 mg | ORAL_CAPSULE | Freq: Every day | ORAL | Status: DC
  Administered 2023-11-28 – 2023-11-30 (×3): 0.4 mg via ORAL
  Filled 2023-11-28 (×3): qty 1

## 2023-11-28 MED ORDER — ATORVASTATIN CALCIUM 40 MG PO TABS
40.0000 mg | ORAL_TABLET | Freq: Every day | ORAL | Status: DC
  Administered 2023-11-28 – 2023-11-30 (×3): 40 mg via ORAL
  Filled 2023-11-28 (×3): qty 1

## 2023-11-28 MED ORDER — LORAZEPAM 2 MG/ML IJ SOLN
1.0000 mg | INTRAMUSCULAR | Status: DC | PRN
  Administered 2023-11-29: 1 mg via INTRAVENOUS
  Administered 2023-11-29: 2 mg via INTRAVENOUS
  Administered 2023-11-29: 1 mg via INTRAVENOUS
  Administered 2023-11-30: 2 mg via INTRAVENOUS
  Administered 2023-11-30 (×2): 1 mg via INTRAVENOUS
  Filled 2023-11-28 (×6): qty 1

## 2023-11-28 MED ORDER — DILTIAZEM HCL-DEXTROSE 125-5 MG/125ML-% IV SOLN (PREMIX)
5.0000 mg/h | INTRAVENOUS | Status: DC
  Administered 2023-11-28: 5 mg/h via INTRAVENOUS
  Administered 2023-11-29: 10 mg/h via INTRAVENOUS
  Filled 2023-11-28 (×2): qty 125

## 2023-11-28 MED ORDER — FUROSEMIDE 10 MG/ML IJ SOLN
40.0000 mg | Freq: Once | INTRAMUSCULAR | Status: AC
  Administered 2023-11-28: 40 mg via INTRAVENOUS
  Filled 2023-11-28: qty 4

## 2023-11-28 MED ORDER — OXYCODONE HCL 5 MG PO TABS
5.0000 mg | ORAL_TABLET | Freq: Once | ORAL | Status: AC
  Administered 2023-11-28: 5 mg via ORAL
  Filled 2023-11-28: qty 1

## 2023-11-28 MED ORDER — FINASTERIDE 5 MG PO TABS
5.0000 mg | ORAL_TABLET | Freq: Every day | ORAL | Status: DC
  Administered 2023-11-28 – 2023-11-30 (×3): 5 mg via ORAL
  Filled 2023-11-28 (×3): qty 1

## 2023-11-28 MED ORDER — PANTOPRAZOLE SODIUM 40 MG PO TBEC
40.0000 mg | DELAYED_RELEASE_TABLET | Freq: Every day | ORAL | Status: DC
  Administered 2023-11-28 – 2023-11-29 (×2): 40 mg via ORAL
  Filled 2023-11-28 (×2): qty 1

## 2023-11-28 MED ORDER — ADULT MULTIVITAMIN W/MINERALS CH
1.0000 | ORAL_TABLET | Freq: Every day | ORAL | Status: DC
  Administered 2023-11-28 – 2023-11-30 (×3): 1 via ORAL
  Filled 2023-11-28 (×3): qty 1

## 2023-11-28 MED ORDER — ALUM & MAG HYDROXIDE-SIMETH 200-200-20 MG/5ML PO SUSP
30.0000 mL | Freq: Once | ORAL | Status: AC
  Administered 2023-11-28: 30 mL via ORAL
  Filled 2023-11-28: qty 30

## 2023-11-28 MED ORDER — ONDANSETRON HCL 4 MG PO TABS: 4.0000 mg | ORAL_TABLET | Freq: Four times a day (QID) | ORAL | Status: DC | PRN

## 2023-11-28 MED ORDER — MORPHINE SULFATE (PF) 4 MG/ML IV SOLN
2.0000 mg | Freq: Once | INTRAVENOUS | Status: AC
  Administered 2023-11-28: 2 mg via INTRAVENOUS
  Filled 2023-11-28: qty 1

## 2023-11-28 MED ORDER — THIAMINE MONONITRATE 100 MG PO TABS
100.0000 mg | ORAL_TABLET | Freq: Every day | ORAL | Status: DC
  Administered 2023-11-28 – 2023-11-30 (×3): 100 mg via ORAL
  Filled 2023-11-28 (×3): qty 1

## 2023-11-28 MED ORDER — LIDOCAINE VISCOUS HCL 2 % MT SOLN
15.0000 mL | Freq: Once | OROMUCOSAL | Status: AC
  Administered 2023-11-28: 15 mL via ORAL
  Filled 2023-11-28: qty 15

## 2023-11-28 MED ORDER — DILTIAZEM LOAD VIA INFUSION
10.0000 mg | Freq: Once | INTRAVENOUS | Status: AC
  Administered 2023-11-28: 10 mg via INTRAVENOUS
  Filled 2023-11-28: qty 10

## 2023-11-28 MED ORDER — IOHEXOL 350 MG/ML SOLN
75.0000 mL | Freq: Once | INTRAVENOUS | Status: AC | PRN
  Administered 2023-11-28: 75 mL via INTRAVENOUS

## 2023-11-28 MED ORDER — FENTANYL CITRATE PF 50 MCG/ML IJ SOSY: 25.0000 ug | PREFILLED_SYRINGE | Freq: Once | INTRAMUSCULAR | Status: DC

## 2023-11-28 MED ORDER — APIXABAN 5 MG PO TABS
5.0000 mg | ORAL_TABLET | Freq: Two times a day (BID) | ORAL | Status: DC
  Administered 2023-11-28 – 2023-11-30 (×4): 5 mg via ORAL
  Filled 2023-11-28 (×4): qty 1

## 2023-11-28 MED ORDER — FENOFIBRATE 160 MG PO TABS
160.0000 mg | ORAL_TABLET | Freq: Every day | ORAL | Status: DC
  Administered 2023-11-28 – 2023-11-30 (×3): 160 mg via ORAL
  Filled 2023-11-28 (×3): qty 1

## 2023-11-28 MED ORDER — ONDANSETRON HCL 4 MG/2ML IJ SOLN: 4.0000 mg | Freq: Four times a day (QID) | INTRAMUSCULAR | Status: DC | PRN

## 2023-11-28 MED ORDER — FENTANYL CITRATE PF 50 MCG/ML IJ SOSY: 50.0000 ug | PREFILLED_SYRINGE | Freq: Once | INTRAMUSCULAR | Status: DC
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
## 2023-11-28 NOTE — ED Notes (Signed)
ED TO INPATIENT HANDOFF REPORT  ED Nurse Name and Phone #: Jacques Earthly Name/Age/Gender Thomas Donovan 62 y.o. male Room/Bed: APA10/APA10  Code Status   Code Status: Full Code  Home/SNF/Other Home Patient oriented to: self, place, time, and situation Is this baseline? Yes   Triage Complete: Triage complete  Chief Complaint Atrial fibrillation with RVR (HCC) [I48.91]  Triage Note Pt BIB [facility] with reports of CP and SHOB that started at 0200 this morning. Pt reports "my heart beats fast, I don't know what it's called."   Allergies Allergies  Allergen Reactions   Celebrex [Celecoxib] Anaphylaxis   Guaifenesin Anaphylaxis   Ibuprofen Hives and Other (See Comments)    Reaction:  GI bleeding OTC NSAIDS CAUSE BLOOD IN STOOLS   Tramadol Hives and Rash   Acetaminophen    Aspirin Other (See Comments)    Pt was told not to take by MD due to being on Colchicine.   Ketorolac Hives    Blisters between fingers   Toradol [Ketorolac Tromethamine] Other (See Comments)    Blisters between fingers     Level of Care/Admitting Diagnosis ED Disposition     ED Disposition  Admit   Condition  --   Comment  Hospital Area: [facility] [[id number]]  Level of Care: Stepdown [14]  Covid Evaluation: Asymptomatic - no recent exposure (last 10 days) testing not required  Diagnosis: Atrial fibrillation with RVR [facility]) [[id number]]  Admitting Physician: TAT, DAVID [[id number]]  Attending Physician: TAT, DAVID [[id number]]          B Medical/Surgery History Past Medical History:  Diagnosis Date   Aortic aneurysm (HCC) 2019   Chronic back pain    Cold    recent rx   Coronary artery disease    Degeneration of lumbar intervertebral disc    Diabetes mellitus    GERD (gastroesophageal reflux disease)    Gout    History of kidney stones    Hypertension    Pneumonia    Past Surgical History:  Procedure Laterality Date   BACK SURGERY     BIOPSY  11/02/2023   Procedure: BIOPSY;  Surgeon:  Franky Macho, MD;  Location: [facility];  Service: Endoscopy;;   FLEXIBLE SIGMOIDOSCOPY N/A 11/02/2023   Procedure: FLEXIBLE SIGMOIDOSCOPY;  Surgeon: Franky Macho, MD;  Location: [facility];  Service: Endoscopy;  Laterality: N/A;   IRRIGATION AND DEBRIDEMENT ABSCESS N/A 07/15/2019   Procedure: IRRIGATION AND DRAINAGE OF PERINEAL ABSCESS;  Surgeon: Franky Macho, MD;  Location: [facility];  Service: General;  Laterality: N/A;   MAXIMUM ACCESS (MAS)POSTERIOR LUMBAR INTERBODY FUSION (PLIF) 1 LEVEL  11/20/2013   Procedure: FOR MAXIMUM ACCESS (MAS) POSTERIOR LUMBAR INTERBODY FUSION LUMBAR FIVE-SACRAL ONE;  Surgeon: Tia Alert, MD;  Location: [facility];  Service: Neurosurgery;;  FOR MAXIMUM ACCESS (MAS) POSTERIOR LUMBAR INTERBODY FUSION LUMBAR FIVE-SACRAL ONE   TONSILLECTOMY       A IV Location/Drains/Wounds Patient Lines/Drains/Airways Status     Active Line/Drains/Airways     Name Placement date Placement time Site Days   Peripheral IV 11/28/23 20 G 1" Left;Posterior Hand 11/28/23  1020  Hand  less than 1   Peripheral IV 11/28/23 18 G 1.16" Anterior;Left;Proximal Forearm 11/28/23  1051  Forearm  less than 1            Intake/Output Last 24 hours  Intake/Output Summary (Last 24 hours) at 11/28/2023 2218 Last data filed at 11/28/2023 2142 Gross per 24 hour  Intake --  Output 550 ml  Net -550 ml    Labs/Imaging Results for orders placed or performed during the hospital encounter of 11/28/23 (from the past 48 hour(s))  Comprehensive metabolic panel     Status: Abnormal   Collection Time: 11/28/23 10:00 AM  Result Value Ref Range   Sodium 137 135 - 145 mmol/L   Potassium 4.9 3.5 - 5.1 mmol/L   Chloride 100 98 - 111 mmol/L   CO2 26 22 - 32 mmol/L   Glucose, Bld 185 (H) 70 - 99 mg/dL    Comment: Glucose reference range applies only to samples taken after fasting for at least 8 hours.   BUN 25 (H) 8 - 23 mg/dL   Creatinine, Ser 6.64 (H) 0.61 - 1.24 mg/dL   Calcium  9.5 8.9 - 40.3 mg/dL   Total Protein 7.0 6.5 - 8.1 g/dL   Albumin 3.8 3.5 - 5.0 g/dL   AST 19 15 - 41 U/L   ALT 17 0 - 44 U/L   Alkaline Phosphatase 83 38 - 126 U/L   Total Bilirubin 0.5 <1.2 mg/dL   GFR, Estimated 55 (L) >60 mL/min    Comment: (NOTE) Calculated using the CKD-EPI Creatinine Equation (2021)    Anion gap 11 5 - 15    Comment: Performed at [facility], [address], [place], [place] [address]  CBC with Differential     Status: Abnormal   Collection Time: 11/28/23 10:00 AM  Result Value Ref Range   WBC 7.9 4.0 - 10.5 K/uL   RBC 3.54 (L) 4.22 - 5.81 MIL/uL   Hemoglobin 9.4 (L) 13.0 - 17.0 g/dL   HCT 95.6 (L) 38.7 - 56.4 %   MCV 83.6 80.0 - 100.0 fL   MCH 26.6 26.0 - 34.0 pg   MCHC 31.8 30.0 - 36.0 g/dL   RDW 33.2 (H) 95.1 - 88.4 %   Platelets 272 150 - 400 K/uL   nRBC 0.0 0.0 - 0.2 %   Neutrophils Relative % 73 %   Neutro Abs 5.8 1.7 - 7.7 K/uL   Lymphocytes Relative 13 %   Lymphs Abs 1.0 0.7 - 4.0 K/uL   Monocytes Relative 8 %   Monocytes Absolute 0.7 0.1 - 1.0 K/uL   Eosinophils Relative 4 %   Eosinophils Absolute 0.3 0.0 - 0.5 K/uL   Basophils Relative 1 %   Basophils Absolute 0.1 0.0 - 0.1 K/uL   Immature Granulocytes 1 %   Abs Immature Granulocytes 0.04 0.00 - 0.07 K/uL    Comment: Performed at [facility], [address], [place], [place] [address]  Troponin I (High Sensitivity)     Status: None   Collection Time: 11/28/23 10:00 AM  Result Value Ref Range   Troponin I (High Sensitivity) 8 <18 ng/L    Comment: (NOTE) Elevated high sensitivity troponin I (hsTnI) values and significant  changes across serial measurements may suggest ACS but many other  chronic and acute conditions are known to elevate hsTnI results.  Refer to the "Links" section for chest pain algorithms and additional  guidance. Performed at [facility], [address] [place], [place] [address]   TSH     Status: None   Collection Time: 11/28/23 10:00 AM  Result Value Ref Range    TSH 2.446 0.350 - 4.500 uIU/mL    Comment: Performed by a 3rd Generation assay with a functional sensitivity of <=0.01 uIU/mL. Performed at [facility], [address], [place], [place] [address]   Brain natriuretic  peptide     Status: Abnormal   Collection Time: 11/28/23 10:00 AM  Result Value Ref Range   B Natriuretic Peptide 197.0 (H) 0.0 - 100.0 pg/mL    Comment: Performed at [facility], [address], [place], [place] [address]  Troponin I (High Sensitivity)     Status: None   Collection Time: 11/28/23 11:23 AM  Result Value Ref Range   Troponin I (High Sensitivity) 8 <18 ng/L    Comment: (NOTE) Elevated high sensitivity troponin I (hsTnI) values and significant  changes across serial measurements may suggest ACS but many other  chronic and acute conditions are known to elevate hsTnI results.  Refer to the Links section for chest pain algorithms and additional  guidance. Performed at [facility], [address] [place], [place] [address]   Rapid urine drug screen (hospital performed)     Status: Abnormal   Collection Time: 11/28/23  1:17 PM  Result Value Ref Range   Opiates POSITIVE (A) NONE DETECTED   Cocaine NONE DETECTED NONE DETECTED   Benzodiazepines NONE DETECTED NONE DETECTED   Amphetamines NONE DETECTED NONE DETECTED   Tetrahydrocannabinol NONE DETECTED NONE DETECTED   Barbiturates NONE DETECTED NONE DETECTED    Comment: (NOTE) DRUG SCREEN FOR MEDICAL PURPOSES ONLY.  IF CONFIRMATION IS NEEDED FOR ANY PURPOSE, NOTIFY LAB WITHIN 5 DAYS.  LOWEST DETECTABLE LIMITS FOR URINE DRUG SCREEN Drug Class                     Cutoff (ng/mL) Amphetamine and metabolites    1000 Barbiturate and metabolites    200 Benzodiazepine                 200 Opiates and metabolites        300 Cocaine and metabolites        300 THC                            50 Performed at [facility], [address], [place], [place] [address]   Urinalysis, w/ Reflex to Culture (Infection  Suspected) -Urine, Clean Catch     Status: Abnormal   Collection Time: 11/28/23  1:17 PM  Result Value Ref Range   Specimen Source URINE, CLEAN CATCH    Color, Urine STRAW (A) YELLOW   APPearance CLEAR CLEAR   Specific Gravity, Urine 1.008 1.005 - 1.030   pH 6.0 5.0 - 8.0   Glucose, UA 50 (A) NEGATIVE mg/dL   Hgb urine dipstick NEGATIVE NEGATIVE   Bilirubin Urine NEGATIVE NEGATIVE   Ketones, ur NEGATIVE NEGATIVE mg/dL   Protein, ur 30 (A) NEGATIVE mg/dL   Nitrite NEGATIVE NEGATIVE   Leukocytes,Ua NEGATIVE NEGATIVE   RBC / HPF 0-5 0 - 5 RBC/hpf   WBC, UA 0-5 0 - 5 WBC/hpf    Comment:        Reflex urine culture not performed if WBC <=10, OR if Squamous epithelial cells >5. If Squamous epithelial cells >5 suggest recollection.    Bacteria, UA NONE SEEN NONE SEEN   Squamous Epithelial / HPF 0-5 0 - 5 /HPF    Comment: Performed at [facility], [address], [place], [place] [address]   CT Angio Chest PE W and/or Wo Contrast  Result Date: 11/28/2023 CLINICAL DATA:  62 year old male with chest pain and shortness of breath onset 0200 hours. Palpitations. EXAM: CT ANGIOGRAPHY CHEST WITH CONTRAST TECHNIQUE: Multidetector CT imaging of the chest  was performed using the standard protocol during bolus administration of intravenous contrast. Multiplanar CT image reconstructions and MIPs were obtained to evaluate the vascular anatomy. RADIATION DOSE REDUCTION: This exam was performed according to the departmental dose-optimization program which includes automated exposure control, adjustment of the mA and/or kV according to patient size and/or use of iterative reconstruction technique. CONTRAST:  75mL OMNIPAQUE IOHEXOL 350 MG/ML SOLN COMPARISON:  Chest CTA 07/24/2023. CTA abdomen 10/31/2023. Portable chest 0956 hours today. FINDINGS: Cardiovascular: Adequate contrast bolus timing in the pulmonary arterial tree. Mild respiratory motion. No pulmonary artery filling defect identified. Calcified  coronary artery atherosclerosis. Cardiac size remains within normal limits. No pericardial effusion. Negative thoracic aorta. Mediastinum/Nodes: Negative. No mediastinal mass or lymphadenopathy. Lungs/Pleura: Improved lung volumes compared to July and major airways are patent. Largely resolved abnormal lung base opacity since that time. Resolved pleural effusions. Mild curvilinear residual scarring or atelectasis in the lower lobes. No areas of worsening ventilation. Upper Abdomen: Questionably nodular liver contour. Otherwise negative visible mostly noncontrast liver, spleen, pancreas, adrenal glands, kidneys and stomach in the upper abdomen. 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 Occasional associated costovertebral junction ankylosis. Mild chronic T2 superior endplate compression is stable. Absent ankylosis at T1-T2 and in the visible lower cervical spine. Chronic severe right glenohumeral, left acromioclavicular degeneration. No acute or suspicious osseous lesion. Review of the MIP images confirms the above findings. IMPRESSION: 1. Negative for acute pulmonary embolus. 2. Mild lung base scarring or atelectasis. No other acute finding in the chest. 3. Calcified coronary artery atherosclerosis. 4. Cirrhotic Liver is again questioned as on CTA Abdomen last month. 5. Diffuse idiopathic skeletal hyperostosis (DISH). Electronically Signed   By: Odessa Fleming M.D.   On: 11/28/2023 12:26   DG Chest Portable 1 View  Result Date: 11/28/2023 CLINICAL DATA:  Chest pain and shortness of breath. EXAM: PORTABLE CHEST 1 VIEW COMPARISON:  11/23/2023.  CT, 07/24/2023. FINDINGS: Mild enlargement of the cardiac silhouette. No mediastinal or hilar masses. Clear lungs.  No pleural effusion or pneumothorax. Skeletal structures are grossly intact. IMPRESSION: No active disease. Electronically Signed   By: Amie Portland M.D.   On: 11/28/2023 10:37    Pending  Labs Unresulted Labs (From admission, onward)     Start     Ordered   11/29/23 0500  Basic metabolic panel  Tomorrow morning,   R        11/28/23 1620   11/29/23 0500  Magnesium  Tomorrow morning,   R        11/28/23 1620   11/28/23 1820  Hemoglobin A1c  Once,   R        11/28/23 1820            Vitals/Pain Today's Vitals   11/28/23 1950 11/28/23 2030 11/28/23 2130 11/28/23 2200  BP:  (!) 180/122 (!) 147/92 (!) 161/96  Pulse:  (!) 108 91 92  Resp:  15 19 17   Temp:      TempSrc:      SpO2:  98% 96% 96%  Weight:      Height:      PainSc: 10-Worst pain ever       Isolation Precautions No active isolations  Medications Medications  diltiazem (CARDIZEM) 1 mg/mL load via infusion 10 mg (10 mg Intravenous Bolus from Bag 11/28/23 1443)    And  diltiazem (CARDIZEM) 125 mg in dextrose 5% 125 mL (1 mg/mL) infusion (5 mg/hr Intravenous New Bag/Given 11/28/23 1442)  atorvastatin (LIPITOR) tablet 40 mg (40 mg Oral Given 11/28/23 1708)  fenofibrate tablet 160 mg (160 mg Oral Given 11/28/23 1708)  pantoprazole (PROTONIX) EC tablet 40 mg (40 mg Oral Given 11/28/23 1707)  tamsulosin (FLOMAX) capsule 0.4 mg (0.4 mg Oral Given 11/28/23 1708)  finasteride (PROSCAR) tablet 5 mg (5 mg Oral Given 11/28/23 1707)  apixaban (ELIQUIS) tablet 5 mg (5 mg Oral Given 11/28/23 2135)  ondansetron (ZOFRAN) tablet 4 mg (has no administration in time range)    Or  ondansetron (ZOFRAN) injection 4 mg (has no administration in time range)  LORazepam (ATIVAN) tablet 1-4 mg (has no administration in time range)    Or  LORazepam (ATIVAN) injection 1-4 mg (has no administration in time range)  thiamine (VITAMIN B1) tablet 100 mg (100 mg Oral Given 11/28/23 1708)    Or  thiamine (VITAMIN B1) injection 100 mg ( Intravenous See Alternative 11/28/23 1708)  folic acid (FOLVITE) tablet 1 mg (1 mg Oral Given 11/28/23 1707)  multivitamin with minerals tablet 1 tablet (1 tablet Oral Given 11/28/23 1707)  LORazepam (ATIVAN)  injection 1 mg (1 mg Intravenous Given 11/28/23 1035)  diltiazem (CARDIZEM) injection 10 mg (10 mg Intravenous Given 11/28/23 1036)  fentaNYL (SUBLIMAZE) injection 100 mcg (100 mcg Intravenous Given 11/28/23 1042)  iohexol (OMNIPAQUE) 350 MG/ML injection 75 mL (75 mLs Intravenous Contrast Given 11/28/23 1202)  alum & mag hydroxide-simeth (MAALOX/MYLANTA) 200-200-20 MG/5ML suspension 30 mL (30 mLs Oral Given 11/28/23 1313)    And  lidocaine (XYLOCAINE) 2 % viscous mouth solution 15 mL (15 mLs Oral Given 11/28/23 1313)  morphine (PF) 4 MG/ML injection 2 mg (2 mg Intravenous Given 11/28/23 1315)  diphenhydrAMINE (BENADRYL) injection 25 mg (25 mg Intravenous Given 11/28/23 1458)  furosemide (LASIX) injection 40 mg (40 mg Intravenous Given 11/28/23 1706)  oxyCODONE (Oxy IR/ROXICODONE) immediate release tablet 5 mg (5 mg Oral Given 11/28/23 1708)    Mobility walks     Focused Assessments    R Recommendations: See Admitting Provider Note  Report given to:   Additional Notes: A&O; Continent with urinal; Cardizem at 10; CIWA Q6H; 20G R hand; 18G RAC;
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
## 2023-11-28 NOTE — Plan of Care (Signed)

## 2023-11-28 NOTE — ED Triage Notes (Signed)
Pt BIB [facility] with reports of CP and SHOB that started at 0200 this morning. Pt reports "my heart beats fast, I don't know what it's called."
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
## 2023-11-29 ENCOUNTER — Observation Stay (HOSPITAL_BASED_OUTPATIENT_CLINIC_OR_DEPARTMENT_OTHER): Payer: Medicare HMO

## 2023-11-29 DIAGNOSIS — I129 Hypertensive chronic kidney disease with stage 1 through stage 4 chronic kidney disease, or unspecified chronic kidney disease: Secondary | ICD-10-CM | POA: Diagnosis not present

## 2023-11-29 DIAGNOSIS — E669 Obesity, unspecified: Secondary | ICD-10-CM

## 2023-11-29 DIAGNOSIS — E1165 Type 2 diabetes mellitus with hyperglycemia: Secondary | ICD-10-CM | POA: Diagnosis not present

## 2023-11-29 DIAGNOSIS — Z720 Tobacco use: Secondary | ICD-10-CM | POA: Diagnosis not present

## 2023-11-29 DIAGNOSIS — R0789 Other chest pain: Secondary | ICD-10-CM | POA: Diagnosis not present

## 2023-11-29 DIAGNOSIS — I5031 Acute diastolic (congestive) heart failure: Secondary | ICD-10-CM

## 2023-11-29 DIAGNOSIS — I480 Paroxysmal atrial fibrillation: Secondary | ICD-10-CM | POA: Diagnosis not present

## 2023-11-29 DIAGNOSIS — R079 Chest pain, unspecified: Secondary | ICD-10-CM | POA: Diagnosis not present

## 2023-11-29 DIAGNOSIS — I4891 Unspecified atrial fibrillation: Secondary | ICD-10-CM | POA: Diagnosis not present

## 2023-11-29 DIAGNOSIS — E1122 Type 2 diabetes mellitus with diabetic chronic kidney disease: Secondary | ICD-10-CM | POA: Diagnosis not present

## 2023-11-29 LAB — BASIC METABOLIC PANEL
Anion gap: 8 (ref 5–15)
BUN: 22 mg/dL (ref 8–23)
CO2: 28 mmol/L (ref 22–32)
Calcium: 9.3 mg/dL (ref 8.9–10.3)
Chloride: 100 mmol/L (ref 98–111)
Creatinine, Ser: 1.39 mg/dL — ABNORMAL HIGH (ref 0.61–1.24)
GFR, Estimated: 58 mL/min — ABNORMAL LOW (ref >60)
Glucose, Bld: 165 mg/dL — ABNORMAL HIGH (ref 70–99)
Potassium: 4 mmol/L (ref 3.5–5.1)
Sodium: 136 mmol/L (ref 135–145)

## 2023-11-29 LAB — ECHOCARDIOGRAM LIMITED
Calc EF: 71 %
Height: 72 in
S' Lateral: 4.5 cm
Single Plane A2C EF: 76 %
Single Plane A4C EF: 68.6 %
Weight: 4352.76 [oz_av]

## 2023-11-29 LAB — MAGNESIUM: Magnesium: 1.7 mg/dL (ref 1.7–2.4)

## 2023-11-29 LAB — MRSA NEXT GEN BY PCR, NASAL: MRSA by PCR Next Gen: NOT DETECTED

## 2023-11-29 LAB — HEMOGLOBIN A1C
Hgb A1c MFr Bld: 7.5 % — ABNORMAL HIGH (ref 4.8–5.6)
Mean Plasma Glucose: 168.55 mg/dL

## 2023-11-29 MED ORDER — DILTIAZEM HCL 60 MG PO TABS
60.0000 mg | ORAL_TABLET | Freq: Three times a day (TID) | ORAL | Status: DC
  Administered 2023-11-29 – 2023-11-30 (×4): 60 mg via ORAL
  Filled 2023-11-29 (×4): qty 1

## 2023-11-29 MED ORDER — OXYCODONE HCL 5 MG PO TABS
5.0000 mg | ORAL_TABLET | Freq: Four times a day (QID) | ORAL | Status: AC | PRN
  Administered 2023-11-29: 5 mg via ORAL
  Filled 2023-11-29: qty 1

## 2023-11-29 MED ORDER — PERFLUTREN LIPID MICROSPHERE
1.0000 mL | INTRAVENOUS | Status: AC | PRN
  Administered 2023-11-29: 3 mL via INTRAVENOUS

## 2023-11-29 MED ORDER — FUROSEMIDE 40 MG PO TABS
40.0000 mg | ORAL_TABLET | Freq: Every day | ORAL | Status: DC
  Administered 2023-11-29 – 2023-11-30 (×2): 40 mg via ORAL
  Filled 2023-11-29 (×2): qty 1

## 2023-11-29 MED ORDER — ALLOPURINOL 300 MG PO TABS
150.0000 mg | ORAL_TABLET | Freq: Every day | ORAL | Status: DC
  Administered 2023-11-29 – 2023-11-30 (×2): 150 mg via ORAL
  Filled 2023-11-29 (×2): qty 1

## 2023-11-29 MED ORDER — OXYCODONE HCL 5 MG PO TABS
5.0000 mg | ORAL_TABLET | Freq: Once | ORAL | Status: AC | PRN
  Administered 2023-11-29: 5 mg via ORAL
  Filled 2023-11-29: qty 1

## 2023-11-29 MED ORDER — PANTOPRAZOLE SODIUM 40 MG PO TBEC
40.0000 mg | DELAYED_RELEASE_TABLET | Freq: Two times a day (BID) | ORAL | Status: DC
  Administered 2023-11-29 – 2023-11-30 (×2): 40 mg via ORAL
  Filled 2023-11-29 (×2): qty 1

## 2023-11-29 MED ORDER — ACETAMINOPHEN 325 MG PO TABS
650.0000 mg | ORAL_TABLET | Freq: Four times a day (QID) | ORAL | Status: DC | PRN
  Administered 2023-11-30 (×2): 650 mg via ORAL
  Filled 2023-11-29 (×2): qty 2

## 2023-11-29 MED ORDER — COLCHICINE 0.6 MG PO TABS
0.6000 mg | ORAL_TABLET | Freq: Two times a day (BID) | ORAL | Status: DC
  Administered 2023-11-29 – 2023-11-30 (×3): 0.6 mg via ORAL
  Filled 2023-11-29 (×3): qty 1
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
## 2023-11-29 NOTE — Discharge Instructions (Signed)
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
## 2023-11-29 NOTE — Plan of Care (Signed)
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
## 2023-11-29 NOTE — Progress Notes (Signed)
  Echocardiogram 2D Echocardiogram has been performed.  Thomas Donovan 11/29/2023, 2:10 PM

## 2023-11-29 NOTE — Care Management Obs Status (Signed)
MEDICARE OBSERVATION STATUS NOTIFICATION   Patient Details  Name: Thomas Donovan MRN: [id number] Date of Birth: 1961-04-16   Medicare Observation Status Notification Given:  Yes    Corey Harold 11/29/2023, 2:39 PM

## 2023-11-30 DIAGNOSIS — R002 Palpitations: Secondary | ICD-10-CM | POA: Diagnosis present

## 2023-11-30 DIAGNOSIS — R972 Elevated prostate specific antigen [PSA]: Secondary | ICD-10-CM | POA: Diagnosis present

## 2023-11-30 DIAGNOSIS — J449 Chronic obstructive pulmonary disease, unspecified: Secondary | ICD-10-CM | POA: Diagnosis present

## 2023-11-30 DIAGNOSIS — K219 Gastro-esophageal reflux disease without esophagitis: Secondary | ICD-10-CM | POA: Diagnosis present

## 2023-11-30 DIAGNOSIS — G4733 Obstructive sleep apnea (adult) (pediatric): Secondary | ICD-10-CM | POA: Diagnosis present

## 2023-11-30 DIAGNOSIS — F1010 Alcohol abuse, uncomplicated: Secondary | ICD-10-CM | POA: Diagnosis present

## 2023-11-30 DIAGNOSIS — Z794 Long term (current) use of insulin: Secondary | ICD-10-CM | POA: Diagnosis not present

## 2023-11-30 DIAGNOSIS — I4891 Unspecified atrial fibrillation: Secondary | ICD-10-CM | POA: Diagnosis present

## 2023-11-30 DIAGNOSIS — F17210 Nicotine dependence, cigarettes, uncomplicated: Secondary | ICD-10-CM | POA: Diagnosis present

## 2023-11-30 DIAGNOSIS — G8929 Other chronic pain: Secondary | ICD-10-CM | POA: Diagnosis present

## 2023-11-30 DIAGNOSIS — E1165 Type 2 diabetes mellitus with hyperglycemia: Secondary | ICD-10-CM | POA: Diagnosis present

## 2023-11-30 DIAGNOSIS — M549 Dorsalgia, unspecified: Secondary | ICD-10-CM | POA: Diagnosis present

## 2023-11-30 DIAGNOSIS — M109 Gout, unspecified: Secondary | ICD-10-CM

## 2023-11-30 DIAGNOSIS — N1831 Chronic kidney disease, stage 3a: Secondary | ICD-10-CM | POA: Diagnosis present

## 2023-11-30 DIAGNOSIS — M51369 Other intervertebral disc degeneration, lumbar region without mention of lumbar back pain or lower extremity pain: Secondary | ICD-10-CM | POA: Diagnosis present

## 2023-11-30 DIAGNOSIS — F1410 Cocaine abuse, uncomplicated: Secondary | ICD-10-CM | POA: Diagnosis present

## 2023-11-30 DIAGNOSIS — Z6836 Body mass index (BMI) 36.0-36.9, adult: Secondary | ICD-10-CM | POA: Diagnosis not present

## 2023-11-30 DIAGNOSIS — R079 Chest pain, unspecified: Secondary | ICD-10-CM | POA: Diagnosis not present

## 2023-11-30 DIAGNOSIS — I480 Paroxysmal atrial fibrillation: Secondary | ICD-10-CM | POA: Diagnosis not present

## 2023-11-30 DIAGNOSIS — E1122 Type 2 diabetes mellitus with diabetic chronic kidney disease: Secondary | ICD-10-CM | POA: Diagnosis present

## 2023-11-30 DIAGNOSIS — I2510 Atherosclerotic heart disease of native coronary artery without angina pectoris: Secondary | ICD-10-CM | POA: Diagnosis present

## 2023-11-30 DIAGNOSIS — E66812 Obesity, class 2: Secondary | ICD-10-CM | POA: Diagnosis present

## 2023-11-30 DIAGNOSIS — E669 Obesity, unspecified: Secondary | ICD-10-CM | POA: Diagnosis not present

## 2023-11-30 DIAGNOSIS — K7030 Alcoholic cirrhosis of liver without ascites: Secondary | ICD-10-CM | POA: Diagnosis present

## 2023-11-30 DIAGNOSIS — I5033 Acute on chronic diastolic (congestive) heart failure: Secondary | ICD-10-CM | POA: Diagnosis present

## 2023-11-30 DIAGNOSIS — R0789 Other chest pain: Secondary | ICD-10-CM | POA: Diagnosis present

## 2023-11-30 DIAGNOSIS — E782 Mixed hyperlipidemia: Secondary | ICD-10-CM | POA: Diagnosis present

## 2023-11-30 DIAGNOSIS — I130 Hypertensive heart and chronic kidney disease with heart failure and stage 1 through stage 4 chronic kidney disease, or unspecified chronic kidney disease: Secondary | ICD-10-CM | POA: Diagnosis present

## 2023-11-30 LAB — BASIC METABOLIC PANEL
Anion gap: 9 (ref 5–15)
BUN: 19 mg/dL (ref 8–23)
CO2: 27 mmol/L (ref 22–32)
Calcium: 9 mg/dL (ref 8.9–10.3)
Chloride: 99 mmol/L (ref 98–111)
Creatinine, Ser: 1.54 mg/dL — ABNORMAL HIGH (ref 0.61–1.24)
GFR, Estimated: 51 mL/min — ABNORMAL LOW (ref >60)
Glucose, Bld: 162 mg/dL — ABNORMAL HIGH (ref 70–99)
Potassium: 3.5 mmol/L (ref 3.5–5.1)
Sodium: 135 mmol/L (ref 135–145)

## 2023-11-30 MED ORDER — PREDNISONE 20 MG PO TABS: 40.0000 mg | ORAL_TABLET | Freq: Every day | ORAL | Status: DC

## 2023-11-30 MED ORDER — PREDNISONE 20 MG PO TABS: 40.0000 mg | ORAL_TABLET | Freq: Every day | ORAL | 0 refills | Status: AC

## 2023-11-30 MED ORDER — PREDNISONE 20 MG PO TABS
40.0000 mg | ORAL_TABLET | Freq: Every day | ORAL | Status: DC
  Administered 2023-11-30: 40 mg via ORAL
  Filled 2023-11-30: qty 2

## 2023-11-30 MED ORDER — VITAMIN B-1 100 MG PO TABS: 100.0000 mg | ORAL_TABLET | Freq: Every day | ORAL | 3 refills | Status: DC

## 2023-11-30 MED ORDER — DILTIAZEM HCL ER COATED BEADS 240 MG PO CP24: 240.0000 mg | ORAL_CAPSULE | Freq: Every day | ORAL | 1 refills | Status: DC

## 2023-11-30 MED ORDER — FOLIC ACID 1 MG PO TABS: 1.0000 mg | ORAL_TABLET | Freq: Every day | ORAL | 3 refills | Status: DC

## 2023-11-30 MED ORDER — OXYCODONE HCL 5 MG PO TABS
5.0000 mg | ORAL_TABLET | Freq: Four times a day (QID) | ORAL | Status: DC | PRN
  Administered 2023-11-30: 5 mg via ORAL
  Filled 2023-11-30: qty 1

## 2023-11-30 MED ORDER — COLCHICINE 0.6 MG PO TABS: 0.6000 mg | ORAL_TABLET | Freq: Two times a day (BID) | ORAL | 0 refills | Status: DC

## 2023-11-30 MED ORDER — ALLOPURINOL 300 MG PO TABS: 300.0000 mg | ORAL_TABLET | Freq: Every day | ORAL | 1 refills | Status: DC
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
## 2023-12-06 ENCOUNTER — Emergency Department (HOSPITAL_COMMUNITY): Payer: Medicare HMO

## 2023-12-06 ENCOUNTER — Other Ambulatory Visit: Payer: Self-pay

## 2023-12-06 ENCOUNTER — Emergency Department (HOSPITAL_COMMUNITY)
Admission: EM | Admit: 2023-12-06 | Discharge: 2023-12-06 | Disposition: A | Discharge status: Home / Self Care | Payer: Medicare HMO | Attending: Emergency Medicine | Admitting: Emergency Medicine

## 2023-12-06 ENCOUNTER — Encounter (HOSPITAL_COMMUNITY): Payer: Self-pay

## 2023-12-06 DIAGNOSIS — R079 Chest pain, unspecified: Secondary | ICD-10-CM | POA: Diagnosis present

## 2023-12-06 DIAGNOSIS — E871 Hypo-osmolality and hyponatremia: Secondary | ICD-10-CM | POA: Diagnosis not present

## 2023-12-06 DIAGNOSIS — E875 Hyperkalemia: Secondary | ICD-10-CM | POA: Diagnosis not present

## 2023-12-06 DIAGNOSIS — D649 Anemia, unspecified: Secondary | ICD-10-CM | POA: Insufficient documentation

## 2023-12-06 DIAGNOSIS — E119 Type 2 diabetes mellitus without complications: Secondary | ICD-10-CM | POA: Insufficient documentation

## 2023-12-06 DIAGNOSIS — I110 Hypertensive heart disease with heart failure: Secondary | ICD-10-CM | POA: Insufficient documentation

## 2023-12-06 DIAGNOSIS — Z7901 Long term (current) use of anticoagulants: Secondary | ICD-10-CM | POA: Diagnosis not present

## 2023-12-06 DIAGNOSIS — Z1152 Encounter for screening for COVID-19: Secondary | ICD-10-CM | POA: Insufficient documentation

## 2023-12-06 DIAGNOSIS — D72829 Elevated white blood cell count, unspecified: Secondary | ICD-10-CM | POA: Insufficient documentation

## 2023-12-06 DIAGNOSIS — Z79899 Other long term (current) drug therapy: Secondary | ICD-10-CM | POA: Diagnosis not present

## 2023-12-06 DIAGNOSIS — I509 Heart failure, unspecified: Secondary | ICD-10-CM | POA: Diagnosis not present

## 2023-12-06 DIAGNOSIS — Z7984 Long term (current) use of oral hypoglycemic drugs: Secondary | ICD-10-CM | POA: Diagnosis not present

## 2023-12-06 DIAGNOSIS — R0602 Shortness of breath: Secondary | ICD-10-CM | POA: Insufficient documentation

## 2023-12-06 DIAGNOSIS — R072 Precordial pain: Secondary | ICD-10-CM | POA: Diagnosis not present

## 2023-12-06 DIAGNOSIS — R600 Localized edema: Secondary | ICD-10-CM | POA: Insufficient documentation

## 2023-12-06 DIAGNOSIS — I2510 Atherosclerotic heart disease of native coronary artery without angina pectoris: Secondary | ICD-10-CM | POA: Insufficient documentation

## 2023-12-06 LAB — CBC
HCT: 28.5 % — ABNORMAL LOW (ref 39.0–52.0)
Hemoglobin: 8.8 g/dL — ABNORMAL LOW (ref 13.0–17.0)
MCH: 26.4 pg (ref 26.0–34.0)
MCHC: 30.9 g/dL (ref 30.0–36.0)
MCV: 85.6 fL (ref 80.0–100.0)
Platelets: 355 10*3/uL (ref 150–400)
RBC: 3.33 MIL/uL — ABNORMAL LOW (ref 4.22–5.81)
RDW: 18.4 % — ABNORMAL HIGH (ref 11.5–15.5)
WBC: 10.7 10*3/uL — ABNORMAL HIGH (ref 4.0–10.5)
nRBC: 0 % (ref 0.0–0.2)

## 2023-12-06 LAB — COMPREHENSIVE METABOLIC PANEL
ALT: 21 U/L (ref 0–44)
AST: 18 U/L (ref 15–41)
Albumin: 3.6 g/dL (ref 3.5–5.0)
Alkaline Phosphatase: 82 U/L (ref 38–126)
Anion gap: 10 (ref 5–15)
BUN: 26 mg/dL — ABNORMAL HIGH (ref 8–23)
CO2: 19 mmol/L — ABNORMAL LOW (ref 22–32)
Calcium: 9.1 mg/dL (ref 8.9–10.3)
Chloride: 103 mmol/L (ref 98–111)
Creatinine, Ser: 1.29 mg/dL — ABNORMAL HIGH (ref 0.61–1.24)
GFR, Estimated: >60 mL/min (ref >60)
Glucose, Bld: 306 mg/dL — ABNORMAL HIGH (ref 70–99)
Potassium: 5.4 mmol/L — ABNORMAL HIGH (ref 3.5–5.1)
Sodium: 132 mmol/L — ABNORMAL LOW (ref 135–145)
Total Bilirubin: 0.6 mg/dL (ref <1.2)
Total Protein: 7 g/dL (ref 6.5–8.1)

## 2023-12-06 LAB — RAPID URINE DRUG SCREEN, HOSP PERFORMED
Amphetamines: NOT DETECTED
Barbiturates: NOT DETECTED
Benzodiazepines: NOT DETECTED
Cocaine: NOT DETECTED
Opiates: POSITIVE — AB
Tetrahydrocannabinol: NOT DETECTED

## 2023-12-06 LAB — RESP PANEL BY RT-PCR (RSV, FLU A&B, COVID)  RVPGX2
Influenza A by PCR: NEGATIVE
Influenza B by PCR: NEGATIVE
Resp Syncytial Virus by PCR: NEGATIVE
SARS Coronavirus 2 by RT PCR: NEGATIVE

## 2023-12-06 LAB — TROPONIN I (HIGH SENSITIVITY)
Troponin I (High Sensitivity): 9 ng/L (ref <18)
Troponin I (High Sensitivity): 8 ng/L (ref <18)

## 2023-12-06 LAB — BRAIN NATRIURETIC PEPTIDE: B Natriuretic Peptide: 122 pg/mL — ABNORMAL HIGH (ref 0.0–100.0)

## 2023-12-06 MED ORDER — FENTANYL CITRATE PF 50 MCG/ML IJ SOSY
25.0000 ug | PREFILLED_SYRINGE | Freq: Once | INTRAMUSCULAR | Status: AC
  Administered 2023-12-06: 25 ug via INTRAVENOUS
  Filled 2023-12-06: qty 1

## 2023-12-06 MED ORDER — LORAZEPAM 2 MG/ML IJ SOLN
1.0000 mg | Freq: Once | INTRAMUSCULAR | Status: AC
  Administered 2023-12-06: 1 mg via INTRAVENOUS
  Filled 2023-12-06: qty 1

## 2023-12-06 MED ORDER — IPRATROPIUM-ALBUTEROL 0.5-2.5 (3) MG/3ML IN SOLN
3.0000 mL | Freq: Once | RESPIRATORY_TRACT | Status: AC
Start: 2023-12-06 — End: 2023-12-06
  Administered 2023-12-06: 3 mL via RESPIRATORY_TRACT
  Filled 2023-12-06: qty 3

## 2023-12-06 MED ORDER — HYDROCODONE-ACETAMINOPHEN 5-325 MG PO TABS: 1.0000 | ORAL_TABLET | Freq: Four times a day (QID) | ORAL | 0 refills | Status: DC | PRN

## 2023-12-06 NOTE — ED Notes (Signed)
Pt requesting pain medication. ED Provider made aware.

## 2023-12-06 NOTE — ED Provider Notes (Signed)
[place] EMERGENCY DEPARTMENT AT [facility] Provider Note   CSN: [id number] Arrival date & time: 12/06/23  1013     History  Chief Complaint  Patient presents with   Chest Pain    Thomas Donovan is a 62 y.o. male.   Chest Pain   62 year old male presents emergency department with complaints of chest pain, shortness of breath.  Patient states around 1 AM this morning, had left-sided chest pain with radiation to his left jaw as well as down left arm.  Reports history of similar symptoms in the past.  States he has some associated nausea with no emesis.  Called EMS who gave him aspirin as well as nitroglycerin.  Reports no improvement of symptoms.  States that the nitroglycerin gave him a headache so he does not want any additional doses.  Patient has been seen in the ED multiple times for similar planes in the past.  Denies any illicit drug use.  Repeatedly asking for pain medications upon arrival.  Past medical history significant for aortic aneurysm, CAD, GERD, diabetes mellitus, kidney stone, hypertension, CHF, GI bleed, A-fib on Eliquis, cocaine abuse, alcohol use disorder, BPH  Home Medications Prior to Admission medications   Medication Sig Start Date End Date Taking? Authorizing Provider  allopurinol (ZYLOPRIM) 300 MG tablet Take 1 tablet (300 mg total) by mouth daily. 12/01/23  Yes Vassie Loll, MD  atorvastatin (LIPITOR) 40 MG tablet Take 1 tablet (40 mg total) by mouth daily. 10/28/23 12/06/23 Yes Shah, Pratik D, DO  colchicine 0.6 MG tablet Take 1 tablet (0.6 mg total) by mouth 2 (two) times daily. 11/30/23  Yes Vassie Loll, MD  diltiazem (CARDIZEM CD) 240 MG 24 hr capsule Take 1 capsule (240 mg total) by mouth daily. 11/30/23 11/29/24 Yes Vassie Loll, MD  ELIQUIS 5 MG TABS tablet Take 1 tablet (5 mg total) by mouth 2 (two) times daily. 10/28/23  Yes Shah, Pratik D, DO  esomeprazole (NEXIUM) 40 MG capsule Take 1 capsule (40 mg total) by mouth daily. 10/28/23   Yes Shah, Pratik D, DO  fenofibrate micronized (LOFIBRA) 134 MG capsule Take 1 capsule (134 mg total) by mouth daily. 10/28/23  Yes Shah, Pratik D, DO  finasteride (PROSCAR) 5 MG tablet Take 1 tablet (5 mg total) by mouth daily. 10/28/23 10/27/24 Yes Shah, Pratik D, DO  folic acid (FOLVITE) 1 MG tablet Take 1 tablet (1 mg total) by mouth daily. 12/01/23  Yes Vassie Loll, MD  furosemide (LASIX) 40 MG tablet Take 1 tablet (40 mg total) by mouth daily. 10/28/23  Yes Shah, Pratik D, DO  metFORMIN (GLUCOPHAGE) 850 MG tablet Take 1 tablet (850 mg total) by mouth every morning. 11/24/23 12/24/23 Yes Estelle June A, DO  TRELEGY ELLIPTA 100-62.5-25 MCG/ACT AEPB Inhale 1 puff into the lungs daily. 10/28/23  Yes Shah, Pratik D, DO  albuterol (VENTOLIN HFA) 108 (90 Base) MCG/ACT inhaler Inhale 2 puffs into the lungs every 6 (six) hours as needed for wheezing or shortness of breath. 11/24/23   Royanne Foots, DO  glipiZIDE (GLUCOTROL) 10 MG tablet Take 2 tablets (20 mg total) by mouth daily. 10/28/23   Sherryll Burger, Pratik D, DO  naltrexone (DEPADE) 50 MG tablet Take 1 tablet (50 mg total) by mouth daily. For alcohol craving 11/04/23 01/03/24  Shahmehdi, Gemma Payor, MD  tamsulosin (FLOMAX) 0.4 MG CAPS capsule Take 1 capsule (0.4 mg total) by mouth daily. 10/28/23   Sherryll Burger, Pratik D, DO  thiamine (VITAMIN B-1) 100 MG tablet  Take 1 tablet (100 mg total) by mouth daily. 12/01/23   Vassie Loll, MD      Allergies    Celebrex [celecoxib], Guaifenesin, Ibuprofen, Tramadol, Acetaminophen, Aspirin, Ketorolac, and Toradol [ketorolac tromethamine]    Review of Systems   Review of Systems  Cardiovascular:  Positive for chest pain.  All other systems reviewed and are negative.   Physical Exam Updated Vital Signs BP (!) 163/78 (BP Location: Right Arm)   Pulse 81   Temp 98 F (36.7 C)   Resp 18   SpO2 96%  Physical Exam Vitals and nursing note reviewed.  Constitutional:      General: He is not in acute distress.     Appearance: He is well-developed.  HENT:     Head: Normocephalic and atraumatic.  Eyes:     Conjunctiva/sclera: Conjunctivae normal.  Cardiovascular:     Rate and Rhythm: Normal rate and regular rhythm.     Heart sounds: No murmur heard. Pulmonary:     Effort: Pulmonary effort is normal. No respiratory distress.     Comments: Wheeze appreciated bilateral lung fields. Abdominal:     Palpations: Abdomen is soft.     Tenderness: There is no abdominal tenderness.  Musculoskeletal:        General: No swelling.     Cervical back: Neck supple.     Right lower leg: Edema present.     Left lower leg: Edema present.  Skin:    General: Skin is warm and dry.     Capillary Refill: Capillary refill takes less than 2 seconds.  Neurological:     Mental Status: He is alert.  Psychiatric:        Mood and Affect: Mood normal.     ED Results / Procedures / Treatments   Labs (all labs ordered are listed, but only abnormal results are displayed) Labs Reviewed  COMPREHENSIVE METABOLIC PANEL - Abnormal; Notable for the following components:      Result Value   Sodium 132 (*)    Potassium 5.4 (*)    CO2 19 (*)    Glucose, Bld 306 (*)    BUN 26 (*)    Creatinine, Ser 1.29 (*)    All other components within normal limits  CBC - Abnormal; Notable for the following components:   WBC 10.7 (*)    RBC 3.33 (*)    Hemoglobin 8.8 (*)    HCT 28.5 (*)    RDW 18.4 (*)    All other components within normal limits  BRAIN NATRIURETIC PEPTIDE - Abnormal; Notable for the following components:   B Natriuretic Peptide 122.0 (*)    All other components within normal limits  RAPID URINE DRUG SCREEN, HOSP PERFORMED - Abnormal; Notable for the following components:   Opiates POSITIVE (*)    All other components within normal limits  RESP PANEL BY RT-PCR (RSV, FLU A&B, COVID)  RVPGX2  TROPONIN I (HIGH SENSITIVITY)  TROPONIN I (HIGH SENSITIVITY)    EKG EKG Interpretation Date/Time:  Wednesday December 06 2023 10:21:49 EST Ventricular Rate:  87 PR Interval:    QRS Duration:  93 QT Interval:  367 QTC Calculation: 410 R Axis:   36  Text Interpretation: Atrial fibrillation Low voltage, precordial leads No significant change since last tracing Rate controlled Confirmed by Vanetta Mulders [phone]) on 12/06/2023 10:28:15 AM  Radiology DG Chest Port 1 View  Result Date: 12/06/2023 CLINICAL DATA:  Left-sided chest pain radiating to the neck and arm. Shortness of  breath. EXAM: PORTABLE CHEST 1 VIEW COMPARISON:  CT chest and chest x-ray dated November 28, 2023. FINDINGS: The heart size and mediastinal contours are within normal limits. Both lungs are clear. The visualized skeletal structures are unremarkable. IMPRESSION: No active disease. Electronically Signed   By: Obie Dredge M.D.   On: 12/06/2023 11:43    Procedures Procedures    Medications Ordered in ED Medications  LORazepam (ATIVAN) injection 1 mg (1 mg Intravenous Given 12/06/23 1114)  fentaNYL (SUBLIMAZE) injection 25 mcg (25 mcg Intravenous Given 12/06/23 1217)  ipratropium-albuterol (DUONEB) 0.5-2.5 (3) MG/3ML nebulizer solution 3 mL (3 mLs Nebulization Given 12/06/23 1232)    ED Course/ Medical Decision Making/ A&P                                 Medical Decision Making Amount and/or Complexity of Data Reviewed Labs: ordered. Radiology: ordered.  Risk Prescription drug management.   This patient presents to the ED for concern of chest pain, this involves an extensive number of treatment options, and is a complaint that carries with it a high risk of complications and morbidity.  The differential diagnosis includes ACS, PE, pneumothorax, aortic dissection, aortic aneurysm, pneumonia, viral URI, anxiety, malingering, other   Co morbidities that complicate the patient evaluation  See HPI   Additional history obtained:  Additional history obtained from EMR External records from outside source obtained and reviewed  including hospital records   Lab Tests:  I Ordered, and personally interpreted labs.  The pertinent results include: Leukocytosis of 10.7.  Anemia with a hemoglobin of 8.8.  Platelets within range.  Hyponatremia of 132.  Hyperkalemia 5.4; will recommend follow-up with PCP in the outpatient setting for repeat metabolic panel for assessment of potassium..  Patient with baseline renal dysfunction with creatinine 1.29, BUN of 26 GFR greater than 60.  Initial troponin of 9 with repeat 8.  UDS positive for opiates.  BNP normal.   Imaging Studies ordered:  I ordered imaging studies including chest x-ray I independently visualized and interpreted imaging which showed no acute cardiopulmonary abnormalities I agree with the radiologist interpretation   Cardiac Monitoring: / EKG:  The patient was maintained on a cardiac monitor.  I personally viewed and interpreted the cardiac monitored which showed an underlying rhythm of: Atrial fibrillation without evidence of acute ischemic change   Consultations Obtained:  I requested consultation with attending physician Dr. Deretha Emory who is in agreement with treatment plan going forward.   Problem List / ED Course / Critical interventions / Medication management  Chest pain I ordered medication including Ativan, fentanyl, DuoNeb Reevaluation of the patient after these medicines showed that the patient improved I have reviewed the patients home medicines and have made adjustments as needed   Social Determinants of Health:  Polysubstance use.   Test / Admission - Considered:  Chest pain Vitals signs within normal range and stable throughout visit. Laboratory/imaging studies significant for: See above 62 year old male presents emergency department with complaints of chest pain.  Chest pain began around 1 AM this morning.  Some radiation to left jaw as well as left arm.  Workup today overall reassuring.  Patient with delta negative troponin, lack  of acute ischemic change on EKG; low suspicion for ACS.  Patient with low PESI score; low suspicion for PE.  Just recently had PE scan 11/28/2023 which was negative for PE.  Chest x-ray without obvious signs of pneumonia.  BNP slightly elevated 122 without evidence of pulmonary vascular congestion/pleural effusions on chest x-ray imaging; low suspicion for CHF.  Patient without chest pain rating to back, neurologic deficit, pulse deficits; low suspicion for aortic dissection.  Patient noted resolution of symptoms with medicines administered on the emergency department.  Will recommend follow-up with cardiology/PCP in the outpatient setting. Worrisome signs and symptoms were discussed with the patient, and the patient acknowledged understanding to return to the ED if noticed. Patient was stable upon discharge.          Final Clinical Impression(s) / ED Diagnoses Final diagnoses:  Precordial chest pain    Rx / DC Orders      Peter Garter, PA 12/06/23 1354    Vanetta Mulders, MD 12/07/23 [phone]

## 2023-12-06 NOTE — ED Notes (Signed)
MD made aware of pt c/o CP

## 2023-12-06 NOTE — Discharge Instructions (Addendum)
As discussed, workup today overall reassuring.  Your heart enzymes were normal and EKG appears unchanged; does not like you are having a heart attack.  Will recommend follow-up with PCP/cardiology in the outpatient setting.  See information attached to discharge papers to schedule an appointment.  Please do not hesitate to return if the worrisome signs and symptoms we discussed become apparent.

## 2023-12-06 NOTE — ED Notes (Signed)
Pt c/o pain that started last night from left arm radiating up into neck and mid chest that is constant. Pt anxious, tearful. Pt reassured. Pt states he is scared he si going to die. Pt c/o fluid build up more that usual the past 2 days. Ble swelling noted. Abd distended but soft. A/o. Color wnl. Non diaphoretic

## 2023-12-06 NOTE — ED Triage Notes (Signed)
Pt arrived via [facility] from home c/o CP that started at 2 am this morning, states shob, dizziness, and nausea. Also c/o L arm pain that goes up to his jaw. Pt does have a Hx of a fib and is on eliquis. Per EMS, pt was given 1 nitroglycerin en route, however he refused his second nitroglycerin and dose of aspirin per EMS due to being on eliquis.

## 2023-12-08 ENCOUNTER — Ambulatory Visit: Payer: Medicare HMO | Admitting: Urology

## 2023-12-10 NOTE — Progress Notes (Signed)
This patient presented for palpitations and was concerned that he may have unknowingly ingested cocaine. His UDS was indeed positive for cocaine

## 2024-01-01 ENCOUNTER — Other Ambulatory Visit: Payer: Self-pay

## 2024-01-01 ENCOUNTER — Encounter (HOSPITAL_COMMUNITY): Payer: Self-pay | Admitting: *Deleted

## 2024-01-01 ENCOUNTER — Emergency Department (HOSPITAL_COMMUNITY)
Admission: EM | Admit: 2024-01-01 | Discharge: 2024-01-01 | Disposition: A | Discharge status: Home / Self Care | Payer: Medicare HMO | Attending: Student | Admitting: Student

## 2024-01-01 DIAGNOSIS — R339 Retention of urine, unspecified: Secondary | ICD-10-CM | POA: Insufficient documentation

## 2024-01-01 DIAGNOSIS — E119 Type 2 diabetes mellitus without complications: Secondary | ICD-10-CM | POA: Diagnosis not present

## 2024-01-01 DIAGNOSIS — Z7901 Long term (current) use of anticoagulants: Secondary | ICD-10-CM | POA: Diagnosis not present

## 2024-01-01 DIAGNOSIS — Z7984 Long term (current) use of oral hypoglycemic drugs: Secondary | ICD-10-CM | POA: Diagnosis not present

## 2024-01-01 DIAGNOSIS — R102 Pelvic and perineal pain: Secondary | ICD-10-CM

## 2024-01-01 DIAGNOSIS — F17210 Nicotine dependence, cigarettes, uncomplicated: Secondary | ICD-10-CM | POA: Insufficient documentation

## 2024-01-01 DIAGNOSIS — I509 Heart failure, unspecified: Secondary | ICD-10-CM | POA: Diagnosis not present

## 2024-01-01 DIAGNOSIS — I110 Hypertensive heart disease with heart failure: Secondary | ICD-10-CM | POA: Diagnosis not present

## 2024-01-01 DIAGNOSIS — I2510 Atherosclerotic heart disease of native coronary artery without angina pectoris: Secondary | ICD-10-CM | POA: Insufficient documentation

## 2024-01-01 DIAGNOSIS — R1030 Lower abdominal pain, unspecified: Secondary | ICD-10-CM | POA: Insufficient documentation

## 2024-01-01 DIAGNOSIS — Z8546 Personal history of malignant neoplasm of prostate: Secondary | ICD-10-CM | POA: Insufficient documentation

## 2024-01-01 DIAGNOSIS — Z87442 Personal history of urinary calculi: Secondary | ICD-10-CM | POA: Insufficient documentation

## 2024-01-01 LAB — URINALYSIS, ROUTINE W REFLEX MICROSCOPIC
Bacteria, UA: NONE SEEN
Bilirubin Urine: NEGATIVE
Glucose, UA: 50 mg/dL — AB
Hgb urine dipstick: NEGATIVE
Ketones, ur: NEGATIVE mg/dL
Leukocytes,Ua: NEGATIVE
Nitrite: NEGATIVE
Protein, ur: 100 mg/dL — AB
Specific Gravity, Urine: 1.012 (ref 1.005–1.030)
pH: 5 (ref 5.0–8.0)

## 2024-01-01 LAB — COMPREHENSIVE METABOLIC PANEL
ALT: 18 U/L (ref 0–44)
AST: 25 U/L (ref 15–41)
Albumin: 4.2 g/dL (ref 3.5–5.0)
Alkaline Phosphatase: 103 U/L (ref 38–126)
Anion gap: 12 (ref 5–15)
BUN: 18 mg/dL (ref 8–23)
CO2: 22 mmol/L (ref 22–32)
Calcium: 9.6 mg/dL (ref 8.9–10.3)
Chloride: 99 mmol/L (ref 98–111)
Creatinine, Ser: 1.35 mg/dL — ABNORMAL HIGH (ref 0.61–1.24)
GFR, Estimated: 60 mL/min — ABNORMAL LOW (ref >60)
Glucose, Bld: 151 mg/dL — ABNORMAL HIGH (ref 70–99)
Potassium: 3.8 mmol/L (ref 3.5–5.1)
Sodium: 133 mmol/L — ABNORMAL LOW (ref 135–145)
Total Bilirubin: 0.7 mg/dL (ref 0.0–1.2)
Total Protein: 7.6 g/dL (ref 6.5–8.1)

## 2024-01-01 LAB — CBC
HCT: 41.1 % (ref 39.0–52.0)
Hemoglobin: 12.7 g/dL — ABNORMAL LOW (ref 13.0–17.0)
MCH: 26.1 pg (ref 26.0–34.0)
MCHC: 30.9 g/dL (ref 30.0–36.0)
MCV: 84.6 fL (ref 80.0–100.0)
Platelets: 388 10*3/uL (ref 150–400)
RBC: 4.86 MIL/uL (ref 4.22–5.81)
RDW: 15.9 % — ABNORMAL HIGH (ref 11.5–15.5)
WBC: 9.8 10*3/uL (ref 4.0–10.5)
nRBC: 0 % (ref 0.0–0.2)

## 2024-01-01 MED ORDER — HYDROCODONE-ACETAMINOPHEN 5-325 MG PO TABS
2.0000 | ORAL_TABLET | Freq: Once | ORAL | Status: AC
  Administered 2024-01-01: 2 via ORAL
  Filled 2024-01-01: qty 2

## 2024-01-01 MED ORDER — PHENAZOPYRIDINE HCL 200 MG PO TABS: 200.0000 mg | ORAL_TABLET | Freq: Three times a day (TID) | ORAL | 0 refills | Status: DC

## 2024-01-01 MED ORDER — HYDROCODONE-ACETAMINOPHEN 5-325 MG PO TABS: 2.0000 | ORAL_TABLET | Freq: Four times a day (QID) | ORAL | 0 refills | Status: DC | PRN

## 2024-01-01 MED ORDER — ACETAMINOPHEN 325 MG PO TABS
650.0000 mg | ORAL_TABLET | Freq: Once | ORAL | Status: DC
  Filled 2024-01-01: qty 2

## 2024-01-01 MED ORDER — TAMSULOSIN HCL 0.4 MG PO CAPS: 0.4000 mg | ORAL_CAPSULE | Freq: Every day | ORAL | 0 refills | Status: DC

## 2024-01-01 MED ORDER — PHENAZOPYRIDINE HCL 100 MG PO TABS
200.0000 mg | ORAL_TABLET | Freq: Once | ORAL | Status: AC
  Administered 2024-01-01: 200 mg via ORAL
  Filled 2024-01-01: qty 2

## 2024-01-01 NOTE — Discharge Instructions (Addendum)
 Follow-up with urology outpatient. Take flomax  as directed. Please do not hesitate to return to the ED if the worrisome signs and symptoms we discussed become apparent.   Transportation Agency Name: [employer] [place] [employer] Address: [address] [place], [place] [address] Phone: [phone] Website: www.adtsrc.org Services Offered: Meals on [employer]. Home care, at home assisted  living, volunteer services, [facility],  [employer] and [employer] transportation system.  Agency Name: [employer] [facility] Address: [address] [place], [place] [address] Phone: [phone] Website: www.[place].com Services Offered: Transportation for a fee.  Providers Accepting New Patients in [place], [place]  [facility] [address]  [place], [place] [address]J [phone] Accepts most insurances  [facility] [address] [place], [place] [address] [phone] Accepts most insurances  [facility] [address] [place], [place] [address]  [phone] Must meet requirements  [facility] [address] [place], [place] [address] [phone] Accepts most insurances  [facility] [address]  [place], [place] [address] [phone] Accepts most insurances  [facility] [address]   [place], [place]   [phone] Accepts most insurances  [facility])  [phone] [address]  [place], [place] [address] [phone] Accepts most insurances     [facility] [address]  [place], [place] [address] [phone] Accepts most insurances  [facility] [address] [place], [place] [address] [phone] option 1 Accepts Medicaid and [facility] [address]  [place], [place]  [address] [phone] Accepts most insurances  Benita Outhouse, MD [address] [place], [place] [address] [phone] Accepts most insurances  [facility] at [facility] [address]  [place], [place] [address] [phone] Accepts most insurances  [facility] [phone] [address] [place], [place] [address] [phone] Accepts most insurances  [place] [place] [address], [address] [place], [place] [address] [phone]  Accepts most insurances

## 2024-01-01 NOTE — ED Provider Notes (Signed)
 [place] EMERGENCY DEPARTMENT AT [facility] Provider Note   CSN: [id number] Arrival date & time: 01/01/24  1003     History  Chief Complaint  Patient presents with   Abdominal Pain    Thomas Donovan is a 63 y.o. male.  HPI   63 year old male presents emergency department with complaints of lower middle abdominal pain, feelings of urinary retention.  States that he has been having symptoms for the past week or so.  Reports dribbling of urine without complete feelings of emptying his bladder.  Was seen on 12/23 for conglomeration of similar symptoms.  States he still been with symptoms ever since then and never followed up with urologist.  Patient also states disease been out of his at home medications for the past few days and is requesting a refill.  Also states he has been having back pain with radiation down both of his legs to his feet.  States he has been dealing with this intermittently for years.  Denies any saddle anesthesia, weakness/sensory deficits in the legs, bowel incontinence.  Regarding bladder, feels urge inability to urinate but feels like something is blocking urine from coming out.  Denies any fever, chills, nausea, vomiting, change in bowel habits.  Past medical history significant for atrial fibrillation on Eliquis , prostate cancer, aortic aneurysm, chronic back pain, CAD, diabetes mellitus, GERD, kidney stone, hypertension, hemorrhoid, CHF with preserved ejection fraction, BPH, diabetes mellitus type 2  Home Medications Prior to Admission medications   Medication Sig Start Date End Date Taking? Authorizing Provider  HYDROcodone -acetaminophen  (NORCO/VICODIN) 5-325 MG tablet Take 1 tablet by mouth every 6 (six) hours as needed. 12/06/23  Yes [provider]  HYDROcodone -acetaminophen  (NORCO/VICODIN) 5-325 MG tablet Take 2 tablets by mouth every 6 (six) hours as needed. 01/01/24  Yes Silver Fell A, PA  levofloxacin (LEVAQUIN) 500 MG tablet  Take 500 mg by mouth daily. 12/15/23  Yes [provider]  nitrofurantoin, macrocrystal-monohydrate, (MACROBID) 100 MG capsule Take 100 mg by mouth 2 (two) times daily. 12/19/23  Yes [provider]  phenazopyridine  (PYRIDIUM ) 200 MG tablet Take 1 tablet (200 mg total) by mouth 3 (three) times daily. 01/01/24  Yes Silver Fell A, PA  promethazine  (PHENERGAN ) 25 MG tablet Take 25 mg by mouth every 6 (six) hours as needed. 12/19/23  Yes [provider]  tamsulosin  (FLOMAX ) 0.4 MG CAPS capsule Take 1 capsule (0.4 mg total) by mouth daily. 01/01/24  Yes Silver Fell A, PA  albuterol  (VENTOLIN  HFA) 108 (90 Base) MCG/ACT inhaler Inhale 2 puffs into the lungs every 6 (six) hours as needed for wheezing or shortness of breath. 11/24/23   Pamella Ozell LABOR, DO  allopurinol  (ZYLOPRIM ) 300 MG tablet Take 1 tablet (300 mg total) by mouth daily. 12/01/23   Ricky Fines, MD  atorvastatin  (LIPITOR) 40 MG tablet Take 1 tablet (40 mg total) by mouth daily. 10/28/23 12/06/23  Maree, Pratik D, DO  colchicine  0.6 MG tablet Take 1 tablet (0.6 mg total) by mouth 2 (two) times daily. 11/30/23   Ricky Fines, MD  diltiazem  (CARDIZEM  CD) 240 MG 24 hr capsule Take 1 capsule (240 mg total) by mouth daily. 11/30/23 11/29/24  Ricky Fines, MD  ELIQUIS  5 MG TABS tablet Take 1 tablet (5 mg total) by mouth 2 (two) times daily. 10/28/23   Maree, Pratik D, DO  esomeprazole  (NEXIUM ) 40 MG capsule Take 1 capsule (40 mg total) by mouth daily. 10/28/23   Maree, Pratik D, DO  famotidine (PEPCID) 20  MG tablet Take 20 mg by mouth 2 (two) times daily.    [provider]  fenofibrate  micronized (LOFIBRA) 134 MG capsule Take 1 capsule (134 mg total) by mouth daily. 10/28/23   Maree, Pratik D, DO  finasteride  (PROSCAR ) 5 MG tablet Take 1 tablet (5 mg total) by mouth daily. 10/28/23 10/27/24  Maree, Pratik D, DO  folic acid  (FOLVITE ) 1 MG tablet Take 1 tablet (1 mg total) by mouth daily. 12/01/23   Ricky Fines, MD   furosemide  (LASIX ) 40 MG tablet Take 1 tablet (40 mg total) by mouth daily. 10/28/23   Maree, Pratik D, DO  glipiZIDE  (GLUCOTROL ) 10 MG tablet Take 2 tablets (20 mg total) by mouth daily. 10/28/23   Maree, Pratik D, DO  metFORMIN  (GLUCOPHAGE ) 850 MG tablet Take 1 tablet (850 mg total) by mouth every morning. 11/24/23 12/24/23  Pamella Ozell LABOR, DO  naltrexone  (DEPADE) 50 MG tablet Take 1 tablet (50 mg total) by mouth daily. For alcohol  craving 11/04/23 01/03/24  Shahmehdi, Adriana LABOR, MD  tamsulosin  (FLOMAX ) 0.4 MG CAPS capsule Take 1 capsule (0.4 mg total) by mouth daily. 10/28/23   Maree, Pratik D, DO  thiamine  (VITAMIN B-1) 100 MG tablet Take 1 tablet (100 mg total) by mouth daily. 12/01/23   Ricky Fines, MD  TRELEGY ELLIPTA  100-62.5-25 MCG/ACT AEPB Inhale 1 puff into the lungs daily. 10/28/23   Maree, Pratik D, DO      Allergies    Celebrex  [celecoxib ], Guaifenesin , Ibuprofen , Tramadol , Acetaminophen , Aspirin, Ketorolac , and Toradol  [ketorolac  tromethamine ]    Review of Systems   Review of Systems  All other systems reviewed and are negative.   Physical Exam Updated Vital Signs BP (!) 157/76   Pulse (!) 105   Ht 6' (1.829 m)   Wt 117.9 kg   SpO2 99%   BMI 35.26 kg/m  Physical Exam Vitals and nursing note reviewed.  Constitutional:      General: He is not in acute distress.    Appearance: He is well-developed.  HENT:     Head: Normocephalic and atraumatic.  Eyes:     Conjunctiva/sclera: Conjunctivae normal.  Cardiovascular:     Rate and Rhythm: Normal rate and regular rhythm.     Heart sounds: No murmur heard. Pulmonary:     Effort: Pulmonary effort is normal. No respiratory distress.     Breath sounds: Normal breath sounds.  Abdominal:     Palpations: Abdomen is soft.     Tenderness: There is abdominal tenderness in the suprapubic area. There is no right CVA tenderness or left CVA tenderness.  Musculoskeletal:        General: No swelling.     Cervical back: Neck supple.      Comments: Full range of motion bilateral hips, knees, ankles, digits.  Muscular strength 5 out of 5 lower extremities.  No sensory deficits along major nerve distributions of extremities.  Pedal pulses 2+ bilaterally.  DTR symmetric at patella.  Skin:    General: Skin is warm and dry.     Capillary Refill: Capillary refill takes less than 2 seconds.  Neurological:     Mental Status: He is alert.  Psychiatric:        Mood and Affect: Mood normal.     ED Results / Procedures / Treatments   Labs (all labs ordered are listed, but only abnormal results are displayed) Labs Reviewed  COMPREHENSIVE METABOLIC PANEL - Abnormal; Notable for the following components:      Result Value  Sodium 133 (*)    Glucose, Bld 151 (*)    Creatinine, Ser 1.35 (*)    GFR, Estimated 60 (*)    All other components within normal limits  CBC - Abnormal; Notable for the following components:   Hemoglobin 12.7 (*)    RDW 15.9 (*)    All other components within normal limits  URINALYSIS, ROUTINE W REFLEX MICROSCOPIC - Abnormal; Notable for the following components:   Glucose, UA 50 (*)    Protein, ur 100 (*)    All other components within normal limits    EKG None  Radiology No results found.  Procedures Procedures    Medications Ordered in ED Medications  HYDROcodone -acetaminophen  (NORCO/VICODIN) 5-325 MG per tablet 2 tablet (2 tablets Oral Given 01/01/24 1254)  phenazopyridine  (PYRIDIUM ) tablet 200 mg (200 mg Oral Given 01/01/24 1254)    ED Course/ Medical Decision Making/ A&P Clinical Course as of 01/02/24 1023  Mon Jan 01, 2024  1013 CT abdomen/pelvis w/o 12/23: Soft tissue mass inseparable from the left posterior prostate and the adjacent rectal wall is again noted, not as well delineated without contrast today but may have enlarged a few mm in the interval, estimated 3.3 cm. There is local mass effect on the rectum. The bladder wall is above this and appears unaffected. 2. Asymmetric  left-sided rectal wall thickening at the pelvic floor was better seen on the last CT, but may also have increased in the interval. Direct visualization recommended unless already done. 3. Chronic retroperitoneal stranding in the plane of the left ureter, and slight chronic thickening in the left Gerota's and lateral conal fascia, but no more than previously. 4. Trace retroperitoneal free fluid on the left, also unchanged. 5. Nonobstructive micronephrolithiasis. 6. Hepatomegaly with steatosis and early cirrhosis. 7. Cholelithiasis. 8. 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 9. Aortic atherosclerosis. 10. Degenerative and postsurgical changes of the spine.  [CR]  1327 Consulted attending physician Dr. Albertina regarding the patient who independently evaluated the patient.  Recommendation was for [facility] consultation while patient in the ED given the frequent ED visits with no follow-up with urology/oncology in the outpatient setting.  Will treat patient's pain and await TOC consultation. [CR]    Clinical Course User Index [CR] Silver Wonda LABOR, PA                                 Medical Decision Making Amount and/or Complexity of Data Reviewed Labs: ordered.  Risk OTC drugs. Prescription drug management.   This patient presents to the ED for concern of urinary retention, this involves an extensive number of treatment options, and is a complaint that carries with it a high risk of complications and morbidity.  The differential diagnosis includes spinal cord compression/impingement, cauda equina, spinal epidural abscess, malignancy, BPH, nephrolithiasis, other   Co morbidities that complicate the patient evaluation  See HPI   Additional history obtained:  Additional history obtained from EMR External records from outside source obtained and reviewed including hospital records   Lab  Tests:  I Ordered, and personally interpreted labs.  The pertinent results include: Mild hyponatremia 133.  Creatinine at baseline 1.35.  No transaminitis.  No leukocytosis.  Anemia hemoglobin of 12.7.  Platelets within range.  UA with moderate protein   Imaging Studies ordered:  I ordered imaging studies including bladder scan I independently visualized and  interpreted imaging which showed 205 cc in bladder I agree with the radiologist interpretation   Cardiac Monitoring: / EKG:  The patient was maintained on a cardiac monitor.  I personally viewed and interpreted the cardiac monitored which showed an underlying rhythm of: Sinus rhythm   Consultations Obtained:  Consulted attending physician Dr. Albertina regarding the patient who agreed with treatment plan going forward after independent evaluation.   Problem List / ED Course / Critical interventions / Medication management  Urinary retention I ordered medication including Pyridium , norco  Reevaluation of the patient after these medicines showed that the patient improved I have reviewed the patients home medicines and have made adjustments as needed   Social Determinants of Health:  Cigarette use.  Denies illicit drug use.   Test / Admission - Considered:  Feelings of urinary retention Vitals signs significant for hypertension blood pressure 157/76. Otherwise within normal range and stable throughout visit. Laboratory studies significant for: See above 63 year old male presents emergency department with complaints of suprapubic abdominal pain as well as feelings of urinary tension.  Symptoms present for the last week or so with feelings of incomplete emptying of his bladder was seen 12/23 for similar symptoms.  On exam, tenderness suprapubic region without obvious clinical distention in area.  Bedside ultrasound was performed which did show evidence of greater than 200 cc of urine postvoid.  No other red flag signs for back  pain on HPI/PE.  Patient with known history of BPH as well as prostate cancer with ability to void if effort is increased but without loss of bowel/bladder function.  Low suspicion for spinal cord compression/impingement/cauda equina.  Labs reassuring.  Patient had Foley catheter inserted with adequate flow of urine and improvement of symptoms thereafter.  Initially gave patient option of obtaining Foley catheter especially given a week of symptoms of intermittent feelings of urinary retention and he initially agreed.  Had CT imaging on 12/23 with results as above.  While patient was awaiting TOC consultation given self-reported inability to obtain transport to urologist/oncologist in the outpatient setting given patient's prostate cancer and intermittent feelings of urinary retention.  While awaiting for [facility] consultation, patient became verbally aggressive with nursing staff.  Demanding Foley catheter to be removed which was removed by nursing staff.  No emergent medical condition necessitating further workup at this time.  Will place patient on Flomax  and again make referral for urology in the outpatient setting regarding symptoms and known concern for prostate cancer.  Treatment plan discussed at length with patient and he acknowledged understanding was agreeable to said plan.  Patient overall well-appearing, afebrile in no acute distress. Worrisome signs and symptoms were discussed with the patient, and the patient acknowledged understanding to return to the ED if noticed. Patient was stable upon discharge.          Final Clinical Impression(s) / ED Diagnoses Final diagnoses:  Suprapubic abdominal pain  Urinary retention    Rx / DC Orders ED Discharge Orders     None         Silver Wonda LABOR, [place] 01/02/24 1023    Albertina Dixon, MD 01/02/24 2141

## 2024-01-01 NOTE — ED Triage Notes (Signed)
 Pt brought in by [facility] from home with c/o worsening lower abdominal pain, back pain, groin pain, and leg pain x 1 week. Pt reports the pain has been going on for about a year now, but worsened recently. He reports he was told he had cancer at that time but no one ever sent me to a cancer doctor to get treatment and now it's gotten really bad. Pt also reports being unable to urinate but only a few drops. Pain 10/10 for EMS. VSS for EMS. IV attempt by EMS unsuccessful x 2.

## 2024-01-01 NOTE — ED Notes (Signed)
 CSW was consulted for transportation and Nurse, Learning Disability. CSW looked back in patient chart and seen that pt has been consulted for transportation but stated that his aunt takes him around last month. CSW did add transportation list again and shared with PA that TOC does not get anyone established with providers like that , they would need to refer out. TOC signing off.

## 2024-02-26 ENCOUNTER — Emergency Department (HOSPITAL_COMMUNITY)

## 2024-02-26 ENCOUNTER — Inpatient Hospital Stay (HOSPITAL_COMMUNITY)
Admission: EM | Admit: 2024-02-26 | Discharge: 2024-03-03 | DRG: 054 | Disposition: A | Discharge status: Hospice | Attending: Internal Medicine | Admitting: Internal Medicine

## 2024-02-26 DIAGNOSIS — Z5986 Financial insecurity: Secondary | ICD-10-CM

## 2024-02-26 DIAGNOSIS — Z794 Long term (current) use of insulin: Secondary | ICD-10-CM

## 2024-02-26 DIAGNOSIS — Z515 Encounter for palliative care: Secondary | ICD-10-CM

## 2024-02-26 DIAGNOSIS — C787 Secondary malignant neoplasm of liver and intrahepatic bile duct: Secondary | ICD-10-CM

## 2024-02-26 DIAGNOSIS — G4733 Obstructive sleep apnea (adult) (pediatric): Secondary | ICD-10-CM

## 2024-02-26 DIAGNOSIS — Z885 Allergy status to narcotic agent status: Secondary | ICD-10-CM

## 2024-02-26 DIAGNOSIS — N183 Chronic kidney disease, stage 3 unspecified: Secondary | ICD-10-CM | POA: Diagnosis present

## 2024-02-26 DIAGNOSIS — Z7901 Long term (current) use of anticoagulants: Secondary | ICD-10-CM

## 2024-02-26 DIAGNOSIS — Z79899 Other long term (current) drug therapy: Secondary | ICD-10-CM

## 2024-02-26 DIAGNOSIS — R569 Unspecified convulsions: Secondary | ICD-10-CM | POA: Diagnosis present

## 2024-02-26 DIAGNOSIS — G9340 Encephalopathy, unspecified: Secondary | ICD-10-CM | POA: Insufficient documentation

## 2024-02-26 DIAGNOSIS — Z1152 Encounter for screening for COVID-19: Secondary | ICD-10-CM

## 2024-02-26 DIAGNOSIS — Z7984 Long term (current) use of oral hypoglycemic drugs: Secondary | ICD-10-CM

## 2024-02-26 DIAGNOSIS — Z6831 Body mass index (BMI) 31.0-31.9, adult: Secondary | ICD-10-CM

## 2024-02-26 DIAGNOSIS — E1165 Type 2 diabetes mellitus with hyperglycemia: Secondary | ICD-10-CM | POA: Diagnosis present

## 2024-02-26 DIAGNOSIS — G9389 Other specified disorders of brain: Secondary | ICD-10-CM

## 2024-02-26 DIAGNOSIS — R930 Abnormal findings on diagnostic imaging of skull and head, not elsewhere classified: Secondary | ICD-10-CM

## 2024-02-26 DIAGNOSIS — F17210 Nicotine dependence, cigarettes, uncomplicated: Secondary | ICD-10-CM | POA: Diagnosis present

## 2024-02-26 DIAGNOSIS — Z609 Problem related to social environment, unspecified: Secondary | ICD-10-CM | POA: Diagnosis present

## 2024-02-26 DIAGNOSIS — I5022 Chronic systolic (congestive) heart failure: Secondary | ICD-10-CM | POA: Diagnosis present

## 2024-02-26 DIAGNOSIS — E785 Hyperlipidemia, unspecified: Secondary | ICD-10-CM | POA: Diagnosis present

## 2024-02-26 DIAGNOSIS — Z886 Allergy status to analgesic agent status: Secondary | ICD-10-CM

## 2024-02-26 DIAGNOSIS — T380X5A Adverse effect of glucocorticoids and synthetic analogues, initial encounter: Secondary | ICD-10-CM | POA: Diagnosis present

## 2024-02-26 DIAGNOSIS — I2510 Atherosclerotic heart disease of native coronary artery without angina pectoris: Secondary | ICD-10-CM | POA: Diagnosis present

## 2024-02-26 DIAGNOSIS — C7931 Secondary malignant neoplasm of brain: Secondary | ICD-10-CM | POA: Diagnosis not present

## 2024-02-26 DIAGNOSIS — Z888 Allergy status to other drugs, medicaments and biological substances status: Secondary | ICD-10-CM

## 2024-02-26 DIAGNOSIS — R4182 Altered mental status, unspecified: Principal | ICD-10-CM

## 2024-02-26 DIAGNOSIS — C7951 Secondary malignant neoplasm of bone: Secondary | ICD-10-CM | POA: Diagnosis present

## 2024-02-26 DIAGNOSIS — E1122 Type 2 diabetes mellitus with diabetic chronic kidney disease: Secondary | ICD-10-CM | POA: Diagnosis present

## 2024-02-26 DIAGNOSIS — D649 Anemia, unspecified: Secondary | ICD-10-CM | POA: Diagnosis present

## 2024-02-26 DIAGNOSIS — K7460 Unspecified cirrhosis of liver: Secondary | ICD-10-CM | POA: Diagnosis present

## 2024-02-26 DIAGNOSIS — R1310 Dysphagia, unspecified: Secondary | ICD-10-CM | POA: Diagnosis present

## 2024-02-26 DIAGNOSIS — D496 Neoplasm of unspecified behavior of brain: Secondary | ICD-10-CM | POA: Diagnosis present

## 2024-02-26 DIAGNOSIS — M109 Gout, unspecified: Secondary | ICD-10-CM | POA: Diagnosis present

## 2024-02-26 DIAGNOSIS — I480 Paroxysmal atrial fibrillation: Secondary | ICD-10-CM | POA: Diagnosis present

## 2024-02-26 DIAGNOSIS — Z66 Do not resuscitate: Secondary | ICD-10-CM | POA: Diagnosis present

## 2024-02-26 DIAGNOSIS — C61 Malignant neoplasm of prostate: Secondary | ICD-10-CM

## 2024-02-26 DIAGNOSIS — R471 Dysarthria and anarthria: Secondary | ICD-10-CM | POA: Diagnosis present

## 2024-02-26 DIAGNOSIS — G936 Cerebral edema: Secondary | ICD-10-CM | POA: Diagnosis present

## 2024-02-26 DIAGNOSIS — K219 Gastro-esophageal reflux disease without esophagitis: Secondary | ICD-10-CM | POA: Diagnosis present

## 2024-02-26 DIAGNOSIS — I482 Chronic atrial fibrillation, unspecified: Secondary | ICD-10-CM | POA: Diagnosis present

## 2024-02-26 DIAGNOSIS — E66811 Obesity, class 1: Secondary | ICD-10-CM | POA: Diagnosis present

## 2024-02-26 DIAGNOSIS — G9341 Metabolic encephalopathy: Secondary | ICD-10-CM | POA: Insufficient documentation

## 2024-02-26 DIAGNOSIS — I130 Hypertensive heart and chronic kidney disease with heart failure and stage 1 through stage 4 chronic kidney disease, or unspecified chronic kidney disease: Secondary | ICD-10-CM | POA: Diagnosis present

## 2024-02-26 DIAGNOSIS — I4891 Unspecified atrial fibrillation: Secondary | ICD-10-CM

## 2024-02-26 LAB — URINALYSIS, ROUTINE W REFLEX MICROSCOPIC
Bacteria, UA: NONE SEEN
Bilirubin Urine: NEGATIVE
Glucose, UA: 50 mg/dL — AB
Hgb urine dipstick: NEGATIVE
Ketones, ur: NEGATIVE mg/dL
Leukocytes,Ua: NEGATIVE
Nitrite: NEGATIVE
Protein, ur: 100 mg/dL — AB
Specific Gravity, Urine: 1.019 (ref 1.005–1.030)
pH: 5 (ref 5.0–8.0)

## 2024-02-26 LAB — CBG MONITORING, ED: Glucose-Capillary: 168 mg/dL — ABNORMAL HIGH (ref 70–99)

## 2024-02-26 LAB — BASIC METABOLIC PANEL
Anion gap: 12 (ref 5–15)
BUN: 13 mg/dL (ref 8–23)
CO2: 24 mmol/L (ref 22–32)
Calcium: 9.5 mg/dL (ref 8.9–10.3)
Chloride: 102 mmol/L (ref 98–111)
Creatinine, Ser: 1.4 mg/dL — ABNORMAL HIGH (ref 0.61–1.24)
GFR, Estimated: 57 mL/min — ABNORMAL LOW (ref >60)
Glucose, Bld: 196 mg/dL — ABNORMAL HIGH (ref 70–99)
Potassium: 4.1 mmol/L (ref 3.5–5.1)
Sodium: 138 mmol/L (ref 135–145)

## 2024-02-26 LAB — CBC
HCT: 36.2 % — ABNORMAL LOW (ref 39.0–52.0)
Hemoglobin: 11.2 g/dL — ABNORMAL LOW (ref 13.0–17.0)
MCH: 25.1 pg — ABNORMAL LOW (ref 26.0–34.0)
MCHC: 30.9 g/dL (ref 30.0–36.0)
MCV: 81 fL (ref 80.0–100.0)
Platelets: 437 10*3/uL — ABNORMAL HIGH (ref 150–400)
RBC: 4.47 MIL/uL (ref 4.22–5.81)
RDW: 15.2 % (ref 11.5–15.5)
WBC: 12 10*3/uL — ABNORMAL HIGH (ref 4.0–10.5)
nRBC: 0 % (ref 0.0–0.2)

## 2024-02-26 LAB — RESP PANEL BY RT-PCR (RSV, FLU A&B, COVID)  RVPGX2
Influenza A by PCR: NEGATIVE
Influenza B by PCR: NEGATIVE
Resp Syncytial Virus by PCR: NEGATIVE
SARS Coronavirus 2 by RT PCR: NEGATIVE

## 2024-02-26 LAB — RAPID URINE DRUG SCREEN, HOSP PERFORMED
Amphetamines: NOT DETECTED
Barbiturates: NOT DETECTED
Benzodiazepines: NOT DETECTED
Cocaine: NOT DETECTED
Opiates: NOT DETECTED
Tetrahydrocannabinol: NOT DETECTED

## 2024-02-26 LAB — TROPONIN I (HIGH SENSITIVITY)
Troponin I (High Sensitivity): 10 ng/L (ref <18)
Troponin I (High Sensitivity): 10 ng/L (ref <18)

## 2024-02-26 LAB — AMMONIA: Ammonia: 13 umol/L (ref 9–35)

## 2024-02-26 MED ORDER — DILTIAZEM HCL-DEXTROSE 125-5 MG/125ML-% IV SOLN (PREMIX)
5.0000 mg/h | INTRAVENOUS | Status: DC
  Administered 2024-02-26: 5 mg/h via INTRAVENOUS
  Administered 2024-02-27 – 2024-02-28 (×3): 15 mg/h via INTRAVENOUS
  Filled 2024-02-26 (×5): qty 125

## 2024-02-26 MED ORDER — IOHEXOL 350 MG/ML SOLN
75.0000 mL | Freq: Once | INTRAVENOUS | Status: AC | PRN
  Administered 2024-02-27: 75 mL via INTRAVENOUS

## 2024-02-26 MED ORDER — DILTIAZEM LOAD VIA INFUSION
10.0000 mg | Freq: Once | INTRAVENOUS | Status: AC
  Administered 2024-02-26: 10 mg via INTRAVENOUS
  Filled 2024-02-26: qty 10

## 2024-02-26 NOTE — ED Triage Notes (Signed)
 Pt arrives from [facility] with "EVAL AND TREATMENT OF UNWITNESSED FALL". Pt does not remember falling. He is alert, follows some commands, oriented only to person. No listing of blood thinner on MAR from facility- noted to be previously on blood thinners.

## 2024-02-26 NOTE — ED Notes (Signed)
 Patient transported to CT

## 2024-02-26 NOTE — ED Provider Notes (Signed)
  EMERGENCY DEPARTMENT AT [facility] Provider Note   CSN: [id number] Arrival date & time: 02/26/24  1922     History {Add pertinent medical, surgical, social history, OB history to HPI:1} Chief Complaint  Patient presents with   Altered Mental Status    Thomas Donovan is a 63 y.o. male.  62 year old male with past medical history of hypertension and hyperlipidemia presenting to the emergency department today after [facility] jail.  The patient apparently was found down.  He does have Eliquis on his medication list here but looking at the records sent over from jail it does not appear that he is on this currently.  The patient is unable to provide any history.  Is unclear when his last known normal was.  The officer with him does not have much more history to add.   Altered Mental Status      Home Medications Prior to Admission medications   Medication Sig Start Date End Date Taking? Authorizing Provider  albuterol (VENTOLIN HFA) 108 (90 Base) MCG/ACT inhaler Inhale 2 puffs into the lungs every 6 (six) hours as needed for wheezing or shortness of breath. 11/24/23   Royanne Foots, DO  allopurinol (ZYLOPRIM) 300 MG tablet Take 1 tablet (300 mg total) by mouth daily. 12/01/23   Vassie Loll, MD  atorvastatin (LIPITOR) 40 MG tablet Take 1 tablet (40 mg total) by mouth daily. 10/28/23 12/06/23  Sherryll Burger, Pratik D, DO  colchicine 0.6 MG tablet Take 1 tablet (0.6 mg total) by mouth 2 (two) times daily. 11/30/23   Vassie Loll, MD  diltiazem (CARDIZEM CD) 240 MG 24 hr capsule Take 1 capsule (240 mg total) by mouth daily. 11/30/23 11/29/24  Vassie Loll, MD  ELIQUIS 5 MG TABS tablet Take 1 tablet (5 mg total) by mouth 2 (two) times daily. 10/28/23   Sherryll Burger, Pratik D, DO  esomeprazole (NEXIUM) 40 MG capsule Take 1 capsule (40 mg total) by mouth daily. 10/28/23   Sherryll Burger, Pratik D, DO  famotidine (PEPCID) 20 MG tablet Take 20 mg by mouth 2 (two) times daily.     [provider]  fenofibrate micronized (LOFIBRA) 134 MG capsule Take 1 capsule (134 mg total) by mouth daily. 10/28/23   Sherryll Burger, Pratik D, DO  finasteride (PROSCAR) 5 MG tablet Take 1 tablet (5 mg total) by mouth daily. 10/28/23 10/27/24  Sherryll Burger, Pratik D, DO  folic acid (FOLVITE) 1 MG tablet Take 1 tablet (1 mg total) by mouth daily. 12/01/23   Vassie Loll, MD  furosemide (LASIX) 40 MG tablet Take 1 tablet (40 mg total) by mouth daily. 10/28/23   Sherryll Burger, Pratik D, DO  glipiZIDE (GLUCOTROL) 10 MG tablet Take 2 tablets (20 mg total) by mouth daily. 10/28/23   Sherryll Burger, Pratik D, DO  HYDROcodone-acetaminophen (NORCO/VICODIN) 5-325 MG tablet Take 1 tablet by mouth every 6 (six) hours as needed. 12/06/23   [provider]  HYDROcodone-acetaminophen (NORCO/VICODIN) 5-325 MG tablet Take 2 tablets by mouth every 6 (six) hours as needed. 01/01/24   Peter Garter, PA  levofloxacin (LEVAQUIN) 500 MG tablet Take 500 mg by mouth daily. 12/15/23   [provider]  metFORMIN (GLUCOPHAGE) 850 MG tablet Take 1 tablet (850 mg total) by mouth every morning. 11/24/23 12/24/23  Royanne Foots, DO  nitrofurantoin, macrocrystal-monohydrate, (MACROBID) 100 MG capsule Take 100 mg by mouth 2 (two) times daily. 12/19/23   [provider]  phenazopyridine (PYRIDIUM) 200 MG tablet Take 1 tablet (200 mg  total) by mouth 3 (three) times daily. 01/01/24   Peter Garter, PA  promethazine (PHENERGAN) 25 MG tablet Take 25 mg by mouth every 6 (six) hours as needed. 12/19/23   [provider]  tamsulosin (FLOMAX) 0.4 MG CAPS capsule Take 1 capsule (0.4 mg total) by mouth daily. 10/28/23   Sherryll Burger, Pratik D, DO  tamsulosin (FLOMAX) 0.4 MG CAPS capsule Take 1 capsule (0.4 mg total) by mouth daily. 01/01/24   Peter Garter, PA  thiamine (VITAMIN B-1) 100 MG tablet Take 1 tablet (100 mg total) by mouth daily. 12/01/23   Vassie Loll, MD  TRELEGY ELLIPTA 100-62.5-25 MCG/ACT AEPB Inhale 1 puff into the  lungs daily. 10/28/23   Sherryll Burger, Pratik D, DO      Allergies    Celebrex [celecoxib], Guaifenesin, Ibuprofen, Tramadol, Acetaminophen, Aspirin, Ketorolac, and Toradol [ketorolac tromethamine]    Review of Systems   Review of Systems  Reason unable to perform ROS: Altered mental status?, poor historian.  All other systems reviewed and are negative.   Physical Exam Updated Vital Signs BP (!) 146/92 (BP Location: Right Arm)   Pulse 75   Temp 98.2 F (36.8 C) (Oral)   Resp 16   SpO2 99%  Physical Exam Vitals and nursing note reviewed.   Gen: NAD Eyes: PERRL, EOMI HEENT: no oropharyngeal swelling Neck: trachea midline Resp: clear to auscultation bilaterally Card: RRR, no murmurs, rubs, or gallops Abd: nontender, nondistended Extremities: no calf tenderness, no edema Vascular: 2+ radial pulses bilaterally, 2+ DP pulses bilaterally Neuro: NIH stroke scale of 2 for questionable expressive aphasia and questionable left-sided facial droop, the patient does seem to have equal strength and sensation throughout the bilateral upper and lower extremities although he is not the most compliant with testing Skin: no rashes  ED Results / Procedures / Treatments   Labs (all labs ordered are listed, but only abnormal results are displayed) Labs Reviewed  CBG MONITORING, ED - Abnormal; Notable for the following components:      Result Value   Glucose-Capillary 168 (*)    All other components within normal limits  RESP PANEL BY RT-PCR (RSV, FLU A&B, COVID)  [id number]  BASIC METABOLIC PANEL  CBC  RAPID URINE DRUG SCREEN, HOSP PERFORMED  AMMONIA  TROPONIN I (HIGH SENSITIVITY)    EKG None  Radiology No results found.  Procedures Procedures  {Document cardiac monitor, telemetry assessment procedure when appropriate:1}  Medications Ordered in ED Medications - No data to display  ED Course/ Medical Decision Making/ A&P   {   Click here for ABCD2, HEART and other calculatorsREFRESH Note  before signing :1}                              Medical Decision Making 63 year old male with past medical history of diabetes, hypertension, and hyperlipidemia presents the emergency department today with a fall and mental status change.  To his baseline.  Is difficult to get much history out of the patient here.  He does not have any findings on exam to suggest large vessel occlusion at this time.  I am unclear of last known normal and we did try to call the jail with nursing staff and no one there seems to know when his last known normal with is.  I will further evaluate him here with basic labs evaluate for electrolyte abnormalities.  Will obtain a COVID and flu swab on the patient as well as  an ammonia level and UDS.  Also obtain a CT scan of his head and cervical spine for evaluation for acute traumatic injuries or intracranial hemorrhage.  If he continues to be off his baseline we will consider MRI but at this time I do not see any obvious findings on exam to suggest stroke.  The patient is not really cooperating with full cranial nerve testing.  He has poor dentition and is unclear if the facial droop is from this.  When he grimaces when IV is placed it does not appear that he has an obvious droop.  His initial EKG interpreted by me shows atrial fibrillation with RVR with a rate of 139 with nonspecific ST-T changes.  His heart rate in the room has improved to a normal rate  Amount and/or Complexity of Data Reviewed Labs: ordered. Radiology: ordered.   ***  {Document critical care time when appropriate:1} {Document review of labs and clinical decision tools ie heart score, Chads2Vasc2 etc:1}  {Document your independent review of radiology images, and any outside records:1} {Document your discussion with family members, caretakers, and with consultants:1} {Document social determinants of health affecting pt's care:1} {Document your decision making why or why not admission, treatments were  needed:1} Final Clinical Impression(s) / ED Diagnoses Final diagnoses:  None    Rx / DC Orders ED Discharge Orders     None

## 2024-02-26 NOTE — ED Notes (Signed)
 Attempted to call nurses station at facility, no answer. Officer at bedside also attempting to call.

## 2024-02-26 NOTE — ED Notes (Signed)
 Made pt aware of needing a UA and gave pt a urinal.

## 2024-02-26 NOTE — ED Notes (Signed)
 Patient transported back from CT

## 2024-02-26 NOTE — ED Notes (Signed)
 Messaged provider about heart rate.

## 2024-02-27 ENCOUNTER — Emergency Department (HOSPITAL_COMMUNITY)

## 2024-02-27 DIAGNOSIS — K7460 Unspecified cirrhosis of liver: Secondary | ICD-10-CM | POA: Diagnosis present

## 2024-02-27 DIAGNOSIS — G939 Disorder of brain, unspecified: Secondary | ICD-10-CM | POA: Diagnosis not present

## 2024-02-27 DIAGNOSIS — R4182 Altered mental status, unspecified: Secondary | ICD-10-CM | POA: Diagnosis not present

## 2024-02-27 DIAGNOSIS — Z7901 Long term (current) use of anticoagulants: Secondary | ICD-10-CM

## 2024-02-27 DIAGNOSIS — R569 Unspecified convulsions: Secondary | ICD-10-CM | POA: Diagnosis present

## 2024-02-27 DIAGNOSIS — Z66 Do not resuscitate: Secondary | ICD-10-CM | POA: Diagnosis present

## 2024-02-27 DIAGNOSIS — I130 Hypertensive heart and chronic kidney disease with heart failure and stage 1 through stage 4 chronic kidney disease, or unspecified chronic kidney disease: Secondary | ICD-10-CM | POA: Diagnosis present

## 2024-02-27 DIAGNOSIS — I5022 Chronic systolic (congestive) heart failure: Secondary | ICD-10-CM | POA: Diagnosis present

## 2024-02-27 DIAGNOSIS — G9389 Other specified disorders of brain: Secondary | ICD-10-CM

## 2024-02-27 DIAGNOSIS — I482 Chronic atrial fibrillation, unspecified: Secondary | ICD-10-CM | POA: Diagnosis present

## 2024-02-27 DIAGNOSIS — Z794 Long term (current) use of insulin: Secondary | ICD-10-CM | POA: Diagnosis not present

## 2024-02-27 DIAGNOSIS — I4891 Unspecified atrial fibrillation: Secondary | ICD-10-CM | POA: Diagnosis not present

## 2024-02-27 DIAGNOSIS — Z6831 Body mass index (BMI) 31.0-31.9, adult: Secondary | ICD-10-CM | POA: Diagnosis not present

## 2024-02-27 DIAGNOSIS — R930 Abnormal findings on diagnostic imaging of skull and head, not elsewhere classified: Secondary | ICD-10-CM | POA: Diagnosis present

## 2024-02-27 DIAGNOSIS — G9341 Metabolic encephalopathy: Secondary | ICD-10-CM | POA: Insufficient documentation

## 2024-02-27 DIAGNOSIS — E1122 Type 2 diabetes mellitus with diabetic chronic kidney disease: Secondary | ICD-10-CM | POA: Diagnosis present

## 2024-02-27 DIAGNOSIS — D496 Neoplasm of unspecified behavior of brain: Secondary | ICD-10-CM | POA: Diagnosis present

## 2024-02-27 DIAGNOSIS — E1165 Type 2 diabetes mellitus with hyperglycemia: Secondary | ICD-10-CM | POA: Diagnosis present

## 2024-02-27 DIAGNOSIS — Z8546 Personal history of malignant neoplasm of prostate: Secondary | ICD-10-CM | POA: Diagnosis not present

## 2024-02-27 DIAGNOSIS — C7931 Secondary malignant neoplasm of brain: Secondary | ICD-10-CM | POA: Diagnosis present

## 2024-02-27 DIAGNOSIS — R1310 Dysphagia, unspecified: Secondary | ICD-10-CM | POA: Diagnosis present

## 2024-02-27 DIAGNOSIS — G9340 Encephalopathy, unspecified: Secondary | ICD-10-CM | POA: Insufficient documentation

## 2024-02-27 DIAGNOSIS — D649 Anemia, unspecified: Secondary | ICD-10-CM | POA: Diagnosis present

## 2024-02-27 DIAGNOSIS — F17210 Nicotine dependence, cigarettes, uncomplicated: Secondary | ICD-10-CM | POA: Diagnosis present

## 2024-02-27 DIAGNOSIS — Z515 Encounter for palliative care: Secondary | ICD-10-CM | POA: Diagnosis not present

## 2024-02-27 DIAGNOSIS — N183 Chronic kidney disease, stage 3 unspecified: Secondary | ICD-10-CM | POA: Diagnosis present

## 2024-02-27 DIAGNOSIS — R4701 Aphasia: Secondary | ICD-10-CM | POA: Diagnosis not present

## 2024-02-27 DIAGNOSIS — C61 Malignant neoplasm of prostate: Secondary | ICD-10-CM | POA: Diagnosis present

## 2024-02-27 DIAGNOSIS — E785 Hyperlipidemia, unspecified: Secondary | ICD-10-CM | POA: Diagnosis present

## 2024-02-27 DIAGNOSIS — C787 Secondary malignant neoplasm of liver and intrahepatic bile duct: Secondary | ICD-10-CM | POA: Diagnosis present

## 2024-02-27 DIAGNOSIS — I480 Paroxysmal atrial fibrillation: Secondary | ICD-10-CM | POA: Diagnosis present

## 2024-02-27 DIAGNOSIS — Z1152 Encounter for screening for COVID-19: Secondary | ICD-10-CM | POA: Diagnosis not present

## 2024-02-27 DIAGNOSIS — C7951 Secondary malignant neoplasm of bone: Secondary | ICD-10-CM | POA: Diagnosis present

## 2024-02-27 DIAGNOSIS — G936 Cerebral edema: Secondary | ICD-10-CM | POA: Diagnosis present

## 2024-02-27 LAB — CBG MONITORING, ED
Glucose-Capillary: 214 mg/dL — ABNORMAL HIGH (ref 70–99)
Glucose-Capillary: 253 mg/dL — ABNORMAL HIGH (ref 70–99)
Glucose-Capillary: 249 mg/dL — ABNORMAL HIGH (ref 70–99)

## 2024-02-27 MED ORDER — ONDANSETRON HCL 4 MG/2ML IJ SOLN: 4.0000 mg | Freq: Four times a day (QID) | INTRAMUSCULAR | Status: DC | PRN

## 2024-02-27 MED ORDER — SODIUM CHLORIDE 0.9 % IV SOLN: Freq: Once | INTRAVENOUS | Status: AC

## 2024-02-27 MED ORDER — LORAZEPAM 2 MG/ML IJ SOLN
1.0000 mg | Freq: Once | INTRAMUSCULAR | Status: AC
  Administered 2024-02-27: 1 mg via INTRAVENOUS
  Filled 2024-02-27: qty 1

## 2024-02-27 MED ORDER — ONDANSETRON HCL 4 MG PO TABS: 4.0000 mg | ORAL_TABLET | Freq: Four times a day (QID) | ORAL | Status: DC | PRN

## 2024-02-27 MED ORDER — GADOBUTROL 1 MMOL/ML IV SOLN
10.0000 mL | Freq: Once | INTRAVENOUS | Status: AC | PRN
  Administered 2024-02-27: 10 mL via INTRAVENOUS

## 2024-02-27 MED ORDER — INSULIN ASPART 100 UNIT/ML IJ SOLN
0.0000 [IU] | INTRAMUSCULAR | Status: DC
  Administered 2024-02-27: 11 [IU] via SUBCUTANEOUS
  Administered 2024-02-27 (×2): 7 [IU] via SUBCUTANEOUS
  Administered 2024-02-28: 11 [IU] via SUBCUTANEOUS
  Administered 2024-02-28: 15 [IU] via SUBCUTANEOUS
  Administered 2024-02-28 (×2): 11 [IU] via SUBCUTANEOUS

## 2024-02-27 MED ORDER — DEXAMETHASONE SODIUM PHOSPHATE 4 MG/ML IJ SOLN
4.0000 mg | Freq: Four times a day (QID) | INTRAMUSCULAR | Status: DC
  Administered 2024-02-27 – 2024-03-03 (×20): 4 mg via INTRAVENOUS
  Filled 2024-02-27 (×20): qty 1

## 2024-02-27 MED ORDER — DEXAMETHASONE SODIUM PHOSPHATE 10 MG/ML IJ SOLN
10.0000 mg | Freq: Once | INTRAMUSCULAR | Status: AC
  Administered 2024-02-27: 10 mg via INTRAVENOUS
  Filled 2024-02-27: qty 1

## 2024-02-27 MED ORDER — LORAZEPAM 2 MG/ML IJ SOLN
1.0000 mg | Freq: Once | INTRAMUSCULAR | Status: AC | PRN
  Administered 2024-02-27: 1 mg via INTRAVENOUS
  Filled 2024-02-27: qty 1

## 2024-02-27 MED ORDER — LEVETIRACETAM IN NACL 500 MG/100ML IV SOLN
500.0000 mg | Freq: Two times a day (BID) | INTRAVENOUS | Status: DC
  Administered 2024-02-27 – 2024-03-01 (×6): 500 mg via INTRAVENOUS
  Filled 2024-02-27 (×6): qty 100

## 2024-02-27 MED ORDER — SODIUM CHLORIDE 0.9 % IV SOLN: INTRAVENOUS | Status: DC

## 2024-02-27 MED ORDER — LEVETIRACETAM IN NACL 1000 MG/100ML IV SOLN
1000.0000 mg | Freq: Once | INTRAVENOUS | Status: AC
  Administered 2024-02-27: 1000 mg via INTRAVENOUS
  Filled 2024-02-27: qty 100

## 2024-02-27 NOTE — Consult Note (Signed)
 Providing Compassionate, Quality Care - Together   Reason for Consult: Brain Mass Referring Physician: Morey Hummingbird, PA  Thomas Donovan is an 63 y.o. male.  HPI: Mr. Klees is a 63 year old male with a history of hypertension, coronary artery disease, diabetes mellitus, and aortic aneurysm. The patient has Eliquis on his medication list, but it does not appear he is taking this presently. The patient is a poor historian and the guard at the bedside is unable to provide much more history. Per report, the patient was found down and taken to the [facility] Emergency Department. CT head with findings concerning for large acute left parietal lobe infarct. 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 Presently, the patient responds to voice. His speech is very dysarthric. He is able to follow commands with prompting and cues. He denies headache, nausea, or changes in vision.  Past Medical History:  Diagnosis Date   Aortic aneurysm (HCC) 2019   Chronic back pain    Cold    recent rx   Coronary artery disease    Degeneration of lumbar intervertebral disc    Diabetes mellitus    GERD (gastroesophageal reflux disease)    Gout    History of kidney stones    Hypertension    Pneumonia     Past Surgical History:  Procedure Laterality Date   BACK SURGERY     BIOPSY  11/02/2023   Procedure: BIOPSY;  Surgeon: Franky Macho, MD;  Location: [facility] END[facility] SU[facility];  Service: Endoscopy;;   FLEXIBLE SIGMOIDOSCOPY N/A 11/02/2023   Procedure: FLEXIBLE SIGMOIDOSCOPY;  Surgeon: Franky Macho, MD;  Location: [facility] END[facility];  Service: Endoscopy;  Laterality: N/A;   IRRIGATION AND DEBRIDEMENT ABSCESS N/A 07/15/2019   Procedure: IRRIGATION AND DRAINAGE OF PERINEAL ABSCESS;  Surgeon: Franky Macho, MD;  Location: [facility] ORS;  Service: General;  Laterality: N/A;   MAXIMUM ACCESS (MAS)POSTERIOR LUMBAR INTERBODY FUSION (PLIF) 1 LEVEL  11/20/2013   Procedure: FOR MAXIMUM  ACCESS (MAS) POSTERIOR LUMBAR INTERBODY FUSION LUMBAR FIVE-SACRAL ONE;  Surgeon: Tia Alert, MD;  Location: [facility] NEURO ORS;  Service: Neurosurgery;;  FOR MAXIMUM ACCESS (MAS) POSTERIOR LUMBAR INTERBODY FUSION LUMBAR FIVE-SACRAL ONE   TONSILLECTOMY      Family History  Problem Relation Age of Onset   Colon cancer Neg Hx     Social History:  reports that he has quit smoking. His smoking use included cigarettes. He has a 5 pack-year smoking history. He has never used smokeless tobacco. He reports current alcohol use of about 14.0 standard drinks of alcohol per week. He reports that he does not use drugs.  Allergies:  Allergies  Allergen Reactions   Celebrex [Celecoxib] Anaphylaxis   Guaifenesin Anaphylaxis   Ibuprofen Hives and Other (See Comments)    Reaction:  GI bleeding OTC NSAIDS CAUSE BLOOD IN STOOLS   Tramadol Hives and Rash   Acetaminophen Other (See Comments)    Liver issues   Aspirin Other (See Comments)    Pt was told not to take by MD due to being on Colchicine.   Ketorolac Hives    Blisters between fingers   Toradol [Ketorolac Tromethamine] Other (See Comments)    Blisters between fingers     Medications: I have reviewed the patient's current medications.  Results for orders placed or performed during the hospital encounter of 02/26/24 (from the past 48 hours)  Rapid urine drug screen (hospital performed)     Status: None  Collection Time: 02/26/24  7:43 PM  Result Value Ref Range   Opiates NONE DETECTED NONE DETECTED   Cocaine NONE DETECTED NONE DETECTED   Benzodiazepines NONE DETECTED NONE DETECTED   Amphetamines NONE DETECTED NONE DETECTED   Tetrahydrocannabinol NONE DETECTED NONE DETECTED   Barbiturates NONE DETECTED NONE DETECTED    Comment: (NOTE) DRUG SCREEN FOR MEDICAL PURPOSES ONLY.  IF CONFIRMATION IS NEEDED FOR ANY PURPOSE, NOTIFY LAB WITHIN 5 DAYS.  LOWEST DETECTABLE LIMITS FOR URINE DRUG SCREEN Drug Class                     Cutoff  (ng/mL) Amphetamine and metabolites    1000 Barbiturate and metabolites    200 Benzodiazepine                 200 Opiates and metabolites        300 Cocaine and metabolites        300 THC                            50 Performed at [facility], [address], [place], [place] [address]   Resp panel by RT-PCR (RSV, Flu A&B, Covid) Anterior Nasal Swab     Status: None   Collection Time: 02/26/24  7:44 PM   Specimen: Anterior Nasal Swab  Result Value Ref Range   SARS Coronavirus 2 by RT PCR NEGATIVE NEGATIVE    Comment: (NOTE) SARS-CoV-2 target nucleic acids are NOT DETECTED.  The SARS-CoV-2 RNA is generally detectable in upper respiratory specimens during the acute phase of infection. The lowest concentration of SARS-CoV-2 viral copies this assay can detect is 138 copies/mL. A negative result does not preclude SARS-Cov-2 infection and should not be used as the sole basis for treatment or other patient management decisions. A negative result may occur with  improper specimen collection/handling, submission of specimen other than nasopharyngeal swab, presence of viral mutation(s) within the areas targeted by this assay, and inadequate number of viral copies(<138 copies/mL). A negative result must be combined with clinical observations, patient history, and epidemiological information. The expected result is Negative.  Fact Sheet for Patients:  BloggerCourse.com  Fact Sheet for Healthcare Providers:  SeriousBroker.it  This test is no t yet approved or cleared by the [place] FDA and  has been authorized for detection and/or diagnosis of SARS-CoV-2 by FDA under an Emergency Use Authorization (EUA). This EUA will remain  in effect (meaning this test can be used) for the duration of the COVID-19 declaration under Section 564(b)(1) of the Act, 21 U.S.C.section 360bbb-3(b)(1), unless the authorization is terminated  or revoked  sooner.       Influenza A by PCR NEGATIVE NEGATIVE   Influenza B by PCR NEGATIVE NEGATIVE    Comment: (NOTE) The Xpert Xpress SARS-CoV-2/FLU/RSV plus assay is intended as an aid in the diagnosis of influenza from Nasopharyngeal swab specimens and should not be used as a sole basis for treatment. Nasal washings and aspirates are unacceptable for Xpert Xpress SARS-CoV-2/FLU/RSV testing.  Fact Sheet for Patients: BloggerCourse.com  Fact Sheet for Healthcare Providers: SeriousBroker.it  This test is not yet approved or cleared by the [place] FDA and has been authorized for detection and/or diagnosis of SARS-CoV-2 by FDA under an Emergency Use Authorization (EUA). This EUA will remain in effect (meaning this test can be used) for the duration of the COVID-19 declaration under Section 564(b)(1) of the Act, 21 U.S.C.  section 360bbb-3(b)(1), unless the authorization is terminated or revoked.     Resp Syncytial Virus by PCR NEGATIVE NEGATIVE    Comment: (NOTE) Fact Sheet for Patients: BloggerCourse.com  Fact Sheet for Healthcare Providers: SeriousBroker.it  This test is not yet approved or cleared by the [place] FDA and has been authorized for detection and/or diagnosis of SARS-CoV-2 by FDA under an Emergency Use Authorization (EUA). This EUA will remain in effect (meaning this test can be used) for the duration of the COVID-19 declaration under Section 564(b)(1) of the Act, 21 U.S.C. section 360bbb-3(b)(1), unless the authorization is terminated or revoked.  Performed at [facility], [address] [place], [place] [address]   CBG monitoring, ED     Status: Abnormal   Collection Time: 02/26/24  7:45 PM  Result Value Ref Range   Glucose-Capillary 168 (H) 70 - 99 mg/dL    Comment: Glucose reference range applies only to samples taken after fasting for at least 8  hours.  Basic metabolic panel     Status: Abnormal   Collection Time: 02/26/24  8:13 PM  Result Value Ref Range   Sodium 138 135 - 145 mmol/L   Potassium 4.1 3.5 - 5.1 mmol/L   Chloride 102 98 - 111 mmol/L   CO2 24 22 - 32 mmol/L   Glucose, Bld 196 (H) 70 - 99 mg/dL    Comment: Glucose reference range applies only to samples taken after fasting for at least 8 hours.   BUN 13 8 - 23 mg/dL   Creatinine, Ser 0.27 (H) 0.61 - 1.24 mg/dL   Calcium 9.5 8.9 - 25.3 mg/dL   GFR, Estimated 57 (L) >60 mL/min    Comment: (NOTE) Calculated using the CKD-EPI Creatinine Equation (2021)    Anion gap 12 5 - 15    Comment: Performed at [facility], [address] [place], [place] [address]  CBC     Status: Abnormal   Collection Time: 02/26/24  8:13 PM  Result Value Ref Range   WBC 12.0 (H) 4.0 - 10.5 K/uL   RBC 4.47 4.22 - 5.81 MIL/uL   Hemoglobin 11.2 (L) 13.0 - 17.0 g/dL   HCT 34.7 (L) 42.5 - 95.6 %   MCV 81.0 80.0 - 100.0 fL   MCH 25.1 (L) 26.0 - 34.0 pg   MCHC 30.9 30.0 - 36.0 g/dL   RDW 38.7 56.4 - 33.2 %   Platelets 437 (H) 150 - 400 K/uL   nRBC 0.0 0.0 - 0.2 %    Comment: Performed at [facility], [address] [place], [place] [address]  Troponin I (High Sensitivity)     Status: None   Collection Time: 02/26/24  8:13 PM  Result Value Ref Range   Troponin I (High Sensitivity) 10 <18 ng/L    Comment: (NOTE) Elevated high sensitivity troponin I (hsTnI) values and significant  changes across serial measurements may suggest ACS but many other  chronic and acute conditions are known to elevate hsTnI results.  Refer to the "Links" section for chest pain algorithms and additional  guidance. Performed at [facility], [address] [place], [place] [address]   Ammonia     Status: None   Collection Time: 02/26/24  8:13 PM  Result Value Ref Range   Ammonia 13 9 - 35 umol/L    Comment: Performed at [facility], [address], [place], [place] [address]  Urinalysis, Routine w reflex  microscopic -Urine, Clean Catch     Status: Abnormal  Collection Time: 02/26/24  8:23 PM  Result Value Ref Range   Color, Urine YELLOW YELLOW   APPearance HAZY (A) CLEAR   Specific Gravity, Urine 1.019 1.005 - 1.030   pH 5.0 5.0 - 8.0   Glucose, UA 50 (A) NEGATIVE mg/dL   Hgb urine dipstick NEGATIVE NEGATIVE   Bilirubin Urine NEGATIVE NEGATIVE   Ketones, ur NEGATIVE NEGATIVE mg/dL   Protein, ur 098 (A) NEGATIVE mg/dL   Nitrite NEGATIVE NEGATIVE   Leukocytes,Ua NEGATIVE NEGATIVE   RBC / HPF 0-5 0 - 5 RBC/hpf   WBC, UA 0-5 0 - 5 WBC/hpf   Bacteria, UA NONE SEEN NONE SEEN   Squamous Epithelial / HPF 0-5 0 - 5 /HPF   Mucus PRESENT    Hyaline Casts, UA PRESENT     Comment: Performed at [facility], [address] [place], [place] [address]  Troponin I (High Sensitivity)     Status: None   Collection Time: 02/26/24  9:49 PM  Result Value Ref Range   Troponin I (High Sensitivity) 10 <18 ng/L    Comment: (NOTE) Elevated high sensitivity troponin I (hsTnI) values and significant  changes across serial measurements may suggest ACS but many other  chronic and acute conditions are known to elevate hsTnI results.  Refer to the "Links" section for chest pain algorithms and additional  guidance. Performed at [facility], [address], [place], [place] [address]     DG Femur Portable 1 View Right Result Date: 02/27/2024 CLINICAL DATA:  MRI clearance EXAM: RIGHT FEMUR PORTABLE 1 VIEW COMPARISON:  None Available. FINDINGS: There is no evidence of fracture or other focal bone lesions. Soft tissues are unremarkable. No radiopaque foreign body is noted. IMPRESSION: No acute abnormality noted. Electronically Signed   By: Alcide Clever M.D.   On: 02/27/2024 03:41   DG Femur Portable 1 View Left Result Date: 02/27/2024 CLINICAL DATA:  MRI clearance EXAM: LEFT FEMUR PORTABLE 1 VIEW COMPARISON:  None Available. FINDINGS: There is no evidence of fracture or other focal bone lesions. Soft tissues are  unremarkable. No foreign body seen. IMPRESSION: No acute abnormality noted. Electronically Signed   By: Alcide Clever M.D.   On: 02/27/2024 03:41   DG Humerus Right Result Date: 02/27/2024 CLINICAL DATA:  MRI screening EXAM: RIGHT HUMERUS - 2+ VIEW COMPARISON:  None Available. FINDINGS: No acute fracture is noted. A needle is noted overlying the right arm likely extrinsic to the patient. No other focal abnormality is noted. IMPRESSION: Needle is noted over the mid upper arm likely extrinsic to the patient. Correlate with the physical exam. Electronically Signed   By: Alcide Clever M.D.   On: 02/27/2024 03:41   DG Humerus Left Result Date: 02/27/2024 CLINICAL DATA:  Screening for MRI EXAM: LEFT HUMERUS - 2+ VIEW COMPARISON:  None Available. FINDINGS: No acute fractures noted. No radiopaque foreign body is noted. Degenerative changes of the acromioclavicular joint are seen. IMPRESSION: No foreign body noted. Electronically Signed   By: Alcide Clever M.D.   On: 02/27/2024 03:39   CT ANGIO HEAD NECK W WO CM Result Date: 02/27/2024 CLINICAL DATA:  Stroke, follow up EXAM: CT ANGIOGRAPHY HEAD AND NECK WITH AND WITHOUT CONTRAST TECHNIQUE: Multidetector CT imaging of the head and neck was performed using the standard protocol during bolus administration of intravenous contrast. Multiplanar CT image reconstructions and MIPs were obtained to evaluate the vascular anatomy. Carotid stenosis measurements (when applicable) are obtained utilizing NASCET criteria, using the distal internal carotid diameter  as the denominator. RADIATION DOSE REDUCTION: This exam was performed according to the departmental dose-optimization program which includes automated exposure control, adjustment of the mA and/or kV according to patient size and/or use of iterative reconstruction technique. CONTRAST:  75mL OMNIPAQUE IOHEXOL 350 MG/ML SOLN COMPARISON:  None Available. FINDINGS: CTA NECK FINDINGS Aortic arch: Great vessel origins are patent  without significant stenosis. Right carotid system: No evidence of dissection, stenosis (50% or greater), or occlusion. Left carotid system: No evidence of dissection, stenosis (50% or greater), or occlusion. Vertebral arteries: Left dominant. No evidence of dissection, stenosis (50% or greater), or occlusion. Skeleton: No acute abnormality on limited assessment. Other neck: No acute abnormality on limited assessment. Upper chest: Visualized lung apices are clear. 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 Review of the MIP images confirms the above findings CTA HEAD FINDINGS Anterior circulation: No significant stenosis, proximal occlusion, aneurysm, or vascular malformation. Posterior circulation: No significant stenosis, proximal occlusion, aneurysm, or vascular malformation. Venous sinuses: As permitted by contrast timing, patent. Review of the MIP images confirms the above findings IMPRESSION: 1. Although incompletely assessed on this study, suspected enhancing mass in the anterior left temporal lobe which likely is the cause of the patient's extensive left cerebral edema. Recommend MRI of the head with contrast to confirm and further evaluate. 2. No large vessel occlusion or proximal hemodynamically significant stenosis. Findings discussed with Dr. Blinda Leatherwood via telephone at 2:44 a.m. Electronically Signed   By: Feliberto Harts M.D.   On: 02/27/2024 02:45   DG Chest Port 1 View Result Date: 02/26/2024 CLINICAL DATA:  Altered mental status. EXAM: PORTABLE CHEST 1 VIEW COMPARISON:  January 24, 2024 FINDINGS: The heart size and mediastinal contours are within normal limits. Both lungs are clear. Chronic and degenerative changes are seen involving the right shoulder. The visualized skeletal structures are otherwise unremarkable. IMPRESSION: No active disease.  Electronically Signed   By: Aram Candela M.D.   On: 02/26/2024 23:05   CT Cervical Spine Wo Contrast Result Date: 02/26/2024 CLINICAL DATA:  Unwitnessed fall. EXAM: CT CERVICAL SPINE WITHOUT CONTRAST TECHNIQUE: Multidetector CT imaging of the cervical spine was performed without intravenous contrast. Multiplanar CT image reconstructions were also generated. RADIATION DOSE REDUCTION: This exam was performed according to the departmental dose-optimization program which includes automated exposure control, adjustment of the mA and/or kV according to patient size and/or use of iterative reconstruction technique. COMPARISON:  None Available. FINDINGS: Alignment: Normal. Skull base and vertebrae: No acute fracture. No primary bone lesion or focal pathologic process. Soft tissues and spinal canal: No prevertebral fluid or swelling. No visible canal hematoma. Disc levels: Moderate to marked severity endplate sclerosis, anterior osteophyte formation and posterior bony spurring are seen at the level of C5-C6. Mild multilevel endplate sclerosis is seen throughout the remainder of the cervical spine. Mild intervertebral disc space narrowing is seen at the levels of C5-C6 and C6-C7. Bilateral moderate to marked severity multilevel facet joint hypertrophy is noted, right greater than left. Upper chest: Negative. Other: A 9 mm diameter thyroid nodule is seen within the left lobe of the thyroid gland. IMPRESSION: 1. No acute fracture or subluxation in the cervical spine. 2. Moderate to marked severity degenerative changes, most prominent at the level of C5-C6. 3. 9 mm diameter thyroid nodule within the left lobe of the thyroid gland. No follow-up imaging is recommended. This follows ACR consensus  guidelines: Managing Incidental Thyroid Nodules Detected on Imaging: White Paper of the ACR Incidental Thyroid Findings Committee. J Am Coll Radiol 2015; 12:143-150. Electronically Signed   By: Aram Candela M.D.   On:  02/26/2024 23:04   CT Head Wo Contrast Result Date: 02/26/2024 CLINICAL DATA:  Unwitnessed fall. EXAM: CT HEAD WITHOUT CONTRAST TECHNIQUE: Contiguous axial images were obtained from the base of the skull through the vertex without intravenous contrast. RADIATION DOSE REDUCTION: This exam was performed according to the departmental dose-optimization program which includes automated exposure control, adjustment of the mA and/or kV according to patient size and/or use of iterative reconstruction technique. COMPARISON:  None Available. FINDINGS: Brain: A large area of cortical and white matter low attenuation is seen throughout the entire left parietal lobe. There is associated mass effect on the adjacent sulci and left lateral ventricle with approximate 7 mm left-to-right midline shift. No evidence of hemorrhage, hydrocephalus or extra-axial collection or mass lesion. Vascular: No hyperdense vessel or unexpected calcification. Skull: Normal. Negative for fracture or focal lesion. Sinuses/Orbits: There is mild left maxillary sinus, left-sided sphenoid sinus and left ethmoid sinus mucosal thickening. Other: None. IMPRESSION: 1. 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 MRI correlation is recommended. 2. Associated mass effect on the adjacent sulci and left lateral ventricle with approximate 7 mm left-to-right midline shift. 3. Mild left maxillary sinus, left-sided sphenoid sinus and left ethmoid sinus disease. Electronically Signed   By: Aram Candela M.D.   On: 02/26/2024 23:00    Review of Systems  Unable to perform ROS: Mental status change   Blood pressure (!) 146/75, pulse (!) 116, temperature 98.3 F (36.8 C), temperature source Oral, resp. rate 18, SpO2 99%. Estimated body mass index is 35.26 kg/m as calculated from the following:   Height as of 01/01/24: 6' (1.829 m).   Weight as of 01/01/24: 117.9 kg.  Physical Exam Constitutional:      General: He  is not in acute distress. HENT:     Head: Normocephalic and atraumatic.     Mouth/Throat:     Mouth: Mucous membranes are moist.     Pharynx: Oropharynx is clear.  Eyes:     Extraocular Movements: Extraocular movements intact.     Pupils: Pupils are equal, round, and reactive to light.  Cardiovascular:     Rate and Rhythm: Tachycardia present. Rhythm irregular.     Pulses: Normal pulses.  Pulmonary:     Effort: Pulmonary effort is normal. No respiratory distress.  Abdominal:     Palpations: Abdomen is soft.  Musculoskeletal:        General: Normal range of motion.     Cervical back: Normal range of motion and neck supple.  Skin:    General: Skin is warm and dry.     Capillary Refill: Capillary refill takes less than 2 seconds.  Neurological:     Mental Status: He is easily aroused. He is confused.     GCS: GCS eye subscore is 4. GCS verbal subscore is 4. GCS motor subscore is 6.     Comments: Motor function appears intact. Difficult to gauge strength due to patient's ability to comprehend commands. Patient has right facial droop. Oriented to self and year.     Assessment/Plan: Patient with MRI findings concerning for a primary brain tumor. Recommend continued IV steroids and Keppra. Awaiting MRI with contrast. Neurosurgery will continue to follow.  I am in communication with my attending and they agree  with the plan for this patient.   Val Eagle, DNP, AGNP-C Nurse Practitioner  [facility] [address], [address], [place], [place] [address] P: [phone]    F: [phone]  02/27/2024, 6:29 AM

## 2024-02-27 NOTE — Procedures (Signed)
 Patient Name: Thomas Donovan  MRN: [id number]  Epilepsy Attending: Charlsie Quest  Referring Physician/Provider: Caryl Pina, MD  Date: 02/27/2024 Duration: 27.35 mins  Patient history: 63yo M with ams. EEG to evaluate for seizure  Level of alertness: Asleep  AEDs during EEG study: LEV, Dexamethasone, Ativan  Technical aspects: This EEG study was done with scalp electrodes positioned according to the 10-20 International system of electrode placement. Electrical activity was reviewed with band pass filter of 1-70Hz , sensitivity of 7 uV/mm, display speed of 58mm/sec with a 60Hz  notched filter applied as appropriate. EEG data were recorded continuously and digitally stored.  Video monitoring was available and reviewed as appropriate.  Description: Sleep was characterized by sleep spindles (12 to 14 Hz), maximal frontocentral region. Hyperventilation and photic stimulation were not performed.     IMPRESSION: This study during sleep only is within normal limits. No seizures or epileptiform discharges were seen throughout the recording.   If suspicion for ictal- interictal activity remains a concern, a prolonged study can be considered.   [name]

## 2024-02-27 NOTE — H&P (Addendum)
 [facility] History and Physical  BLAS RICHES MWU:[id number] DOB: 1961-08-22 DOA: 02/26/2024   PCP: Pcp, No  Specialists: Patient was recently seen by gastroenterology and urology.  Chief Complaint: Unwitnessed fall at the jail/possible seizure  HPI: Thomas Donovan is a 63 y.o. male with a past medical history of atrial fibrillation on anticoagulation, history of gout, coronary artery disease, essential hypertension, diabetes mellitus type 2 who is currently incarcerated in the jail.  According to the officer at bedside he has been there for about 2 weeks or so.  Had episode where he had an unwitnessed fall.  There are other reports where it was suggested that he was actually talking to the guard when he passed out.  He was initially brought to the emergency department at [facility].  Evaluation there raised concern for stroke versus brain lesion.  Patient was urgently transferred to [facility] for further evaluation.  Patient is currently obtunded.  Not really answering any questions.  Not following commands.  Previous notes reviewed.  Looks like he was found to have a soft tissue mass between the left aspect of the prostate gland and the rectum with local mass effect of the rectum.  Patient underwent sigmoidoscopy in November 2024.  Biopsies were unremarkable.  PSA was noted to be elevated.  Patient was supposed to follow-up with urology for prostate biopsy but it does not appear that that has occurred yet.  In the emergency department he underwent multiple imaging studies including CT scan of the head, MRI brain without contrast and then with contrast.  He was seen by neurosurgery as well as neurology.  He will need hospitalization for further management.  Home Medications: Prior to Admission medications   Medication Sig Start Date End Date Taking? Authorizing Provider  diltiazem (CARDIZEM CD) 180 MG 24 hr capsule Take 180 mg by mouth daily.   Yes [provider]  tamsulosin (FLOMAX) 0.4 MG CAPS capsule Take 1 capsule (0.4 mg total) by mouth daily. 01/01/24  Yes Sherian Maroon A, PA  albuterol (VENTOLIN HFA) 108 (90 Base) MCG/ACT inhaler Inhale 2 puffs into the lungs every 6 (six) hours as needed for wheezing or shortness of breath. Patient not taking: Reported on 02/27/2024 11/24/23   Royanne Foots, DO  allopurinol (ZYLOPRIM) 300 MG tablet Take 1 tablet (300 mg total) by mouth daily. Patient not taking: Reported on 02/27/2024 12/01/23   Vassie Loll, MD  atorvastatin (LIPITOR) 40 MG tablet Take 1 tablet (40 mg total) by mouth daily. Patient not taking: Reported on 02/27/2024 10/28/23 02/26/24  Maurilio Lovely D, DO  colchicine 0.6 MG tablet Take 1 tablet (0.6 mg total) by mouth 2 (two) times daily. Patient not taking: Reported on 02/27/2024 11/30/23   Vassie Loll, MD  ELIQUIS 5 MG TABS tablet Take 1 tablet (5 mg total) by mouth 2 (two) times daily. Patient not taking: Reported on 02/27/2024 10/28/23   Maurilio Lovely D, DO  esomeprazole (NEXIUM) 40 MG capsule Take 1 capsule (40 mg total) by mouth daily. Patient not taking: Reported on 02/27/2024 10/28/23   Maurilio Lovely D, DO  famotidine (PEPCID) 20 MG tablet Take 20 mg by mouth 2 (two) times daily. Patient not taking: Reported on 02/27/2024    [provider]  fenofibrate micronized (LOFIBRA) 134 MG capsule Take 1 capsule (134 mg total) by mouth daily. Patient not taking: Reported on 02/27/2024 10/28/23   Maurilio Lovely D, DO  finasteride (PROSCAR) 5 MG tablet Take 1 tablet (  5 mg total) by mouth daily. Patient not taking: Reported on 02/27/2024 10/28/23 10/27/24  Maurilio Lovely D, DO  folic acid (FOLVITE) 1 MG tablet Take 1 tablet (1 mg total) by mouth daily. Patient not taking: Reported on 02/27/2024 12/01/23   Vassie Loll, MD  furosemide (LASIX) 40 MG tablet Take 1 tablet (40 mg total) by mouth daily. Patient not taking: Reported on 02/27/2024 10/28/23   Maurilio Lovely D, DO  glipiZIDE (GLUCOTROL) 10 MG tablet  Take 2 tablets (20 mg total) by mouth daily. Patient not taking: Reported on 02/27/2024 10/28/23   Maurilio Lovely D, DO  HYDROcodone-acetaminophen (NORCO/VICODIN) 5-325 MG tablet Take 2 tablets by mouth every 6 (six) hours as needed. Patient not taking: Reported on 02/27/2024 01/01/24   Sherian Maroon A, PA  levofloxacin (LEVAQUIN) 500 MG tablet Take 500 mg by mouth daily. Patient not taking: Reported on 02/27/2024 12/15/23   [provider]  metFORMIN (GLUCOPHAGE) 850 MG tablet Take 1 tablet (850 mg total) by mouth every morning. Patient not taking: Reported on 02/27/2024 11/24/23 02/26/24  Royanne Foots, DO  nitrofurantoin, macrocrystal-monohydrate, (MACROBID) 100 MG capsule Take 100 mg by mouth 2 (two) times daily. Patient not taking: Reported on 02/27/2024 12/19/23   [provider]  phenazopyridine (PYRIDIUM) 200 MG tablet Take 1 tablet (200 mg total) by mouth 3 (three) times daily. Patient not taking: Reported on 02/27/2024 01/01/24   Peter Garter, PA  promethazine (PHENERGAN) 25 MG tablet Take 25 mg by mouth every 6 (six) hours as needed. Patient not taking: Reported on 02/27/2024 12/19/23   [provider]  thiamine (VITAMIN B-1) 100 MG tablet Take 1 tablet (100 mg total) by mouth daily. Patient not taking: Reported on 02/27/2024 12/01/23   Vassie Loll, MD  TRELEGY ELLIPTA 100-62.5-25 MCG/ACT AEPB Inhale 1 puff into the lungs daily. Patient not taking: Reported on 02/27/2024 10/28/23   Maurilio Lovely D, DO    Allergies:  Allergies  Allergen Reactions   Celebrex [Celecoxib] Anaphylaxis   Guaifenesin Anaphylaxis   Ibuprofen Hives and Other (See Comments)    Reaction:  GI bleeding OTC NSAIDS CAUSE BLOOD IN STOOLS   Tramadol Hives and Rash   Acetaminophen Other (See Comments)    Liver issues   Aspirin Other (See Comments)    Pt was told not to take by MD due to being on Colchicine.   Ketorolac Hives    Blisters between fingers   Toradol [Ketorolac Tromethamine] Other  (See Comments)    Blisters between fingers     Past Medical History: Past Medical History:  Diagnosis Date   Aortic aneurysm (HCC) 2019   Chronic back pain    Cold    recent rx   Coronary artery disease    Degeneration of lumbar intervertebral disc    Diabetes mellitus    GERD (gastroesophageal reflux disease)    Gout    History of kidney stones    Hypertension    Pneumonia     Past Surgical History:  Procedure Laterality Date   BACK SURGERY     BIOPSY  11/02/2023   Procedure: BIOPSY;  Surgeon: Franky Macho, MD;  Location: [facility];  Service: Endoscopy;;   FLEXIBLE SIGMOIDOSCOPY N/A 11/02/2023   Procedure: FLEXIBLE SIGMOIDOSCOPY;  Surgeon: Franky Macho, MD;  Location: [facility];  Service: Endoscopy;  Laterality: N/A;   IRRIGATION AND DEBRIDEMENT ABSCESS N/A 07/15/2019   Procedure: IRRIGATION AND DRAINAGE OF PERINEAL ABSCESS;  Surgeon: Franky Macho, MD;  Location: [facility] ORS;  Service: General;  Laterality: N/A;   MAXIMUM ACCESS (MAS)POSTERIOR LUMBAR INTERBODY FUSION (PLIF) 1 LEVEL  11/20/2013   Procedure: FOR MAXIMUM ACCESS (MAS) POSTERIOR LUMBAR INTERBODY FUSION LUMBAR FIVE-SACRAL ONE;  Surgeon: Tia Alert, MD;  Location: [facility];  Service: Neurosurgery;;  FOR MAXIMUM ACCESS (MAS) POSTERIOR LUMBAR INTERBODY FUSION LUMBAR FIVE-SACRAL ONE   TONSILLECTOMY      Social History: Currently incarcerated.  Unable to ascertain other information at this time due to his altered mental status.  Family History:  Family History  Problem Relation Age of Onset   Colon cancer Neg Hx   Unable to ascertain due to altered mental status  Review of Systems -unable to do due to altered mental status  Physical Examination  Vitals:   02/27/24 0745 02/27/24 1015 02/27/24 1030 02/27/24 1100  BP: 123/82 129/80 (!) 140/78 126/81  Pulse: 93 90 (!) 114 (!) 107  Resp: 18 (!) 21 20 20   Temp:    98 F (36.7 C)  TempSrc:      SpO2: 96% 95% 98% 95%    BP 126/81   Pulse (!)  107   Temp 98 F (36.7 C)   Resp 20   SpO2 95%   General appearance: Mostly unresponsive.  In no apparent distress. Head: Normocephalic, without obvious abnormality, atraumatic Eyes: conjunctivae/corneas clear. PERRL,  Neck: no adenopathy, no carotid bruit, no JVD, supple, symmetrical, trachea midline, and thyroid not enlarged, symmetric, no tenderness/mass/nodules Resp: clear to auscultation bilaterally Cardio: regular rate and rhythm, S1, S2 normal, no murmur, click, rub or gallop GI: soft, non-tender; bowel sounds normal; no masses,  no organomegaly Extremities: extremities normal, atraumatic, no cyanosis or edema Pulses: 2+ and symmetric Skin: Skin color, texture, turgor normal. No rashes or lesions Lymph nodes: Cervical, supraclavicular, and axillary nodes normal. Neurologic: Obtunded.  Does not answer any questions.  Does not follow commands.   Labs on Admission: I have personally reviewed following labs and imaging studies  CBC: Recent Labs  Lab 02/26/24 2013  WBC 12.0*  HGB 11.2*  HCT 36.2*  MCV 81.0  PLT 437*   Basic Metabolic Panel: Recent Labs  Lab 02/26/24 2013  NA 138  K 4.1  CL 102  CO2 24  GLUCOSE 196*  BUN 13  CREATININE 1.40*  CALCIUM 9.5   GFR: CrCl cannot be calculated (Unknown ideal weight.).  Recent Labs  Lab 02/26/24 2013  AMMONIA 13    CBG: Recent Labs  Lab 02/26/24 1945  GLUCAP 168*     Radiological Exams on Admission: EEG adult Result Date: 02/27/2024 Charlsie Quest, MD     02/27/2024 10:30 AM Patient Name: LORENSO QUIRINO MRN: [id number] Epilepsy Attending: Charlsie Quest Referring Physician/Provider: Caryl Pina, MD Date: 02/27/2024 Duration: 27.35 mins Patient history: 63yo M with ams. EEG to evaluate for seizure Level of alertness: Asleep AEDs during EEG study: LEV, Dexamethasone, Ativan Technical aspects: This EEG study was done with scalp electrodes positioned according to the 10-20 International system of electrode  placement. Electrical activity was reviewed with band pass filter of 1-70Hz , sensitivity of 7 uV/mm, display speed of 67mm/sec with a 60Hz  notched filter applied as appropriate. EEG data were recorded continuously and digitally stored.  Video monitoring was available and reviewed as appropriate. Description: Sleep was characterized by sleep spindles (12 to 14 Hz), maximal frontocentral region. Hyperventilation and photic stimulation were not performed.   IMPRESSION: This study during sleep only is within normal limits. No seizures or  epileptiform discharges were seen throughout the recording.  If suspicion for ictal- interictal activity remains a concern, a prolonged study can be considered. Charlsie Quest   MR BRAIN W WO CONTRAST Result Date: 02/27/2024 CLINICAL DATA:  63 year old male with altered mental status. Unwitnessed fall. Left MCA infarct versus tumor on head CT/CTA. EXAM: MRI HEAD WITHOUT AND WITH CONTRAST TECHNIQUE: Multiplanar, multiecho pulse sequences of the brain and surrounding structures were obtained without and with intravenous contrast. CONTRAST:  10mL GADAVIST GADOBUTROL 1 MMOL/ML IV SOLN COMPARISON:  Head CT yesterday, CTA head and neck 0047 hours today. FINDINGS: Brain: Large round and lobulated, intensely enhancing left middle cranial fossa mass. The enhancing component encompasses 42 x 45 x 41 mm and is inseparable from the left cavernous sinus (series 8, image 21). This was largely isodense by CT. Extensive tumoral edema in the left hemisphere with mostly a vasogenic pattern including tracking into all deep white matter capsules, the deep gray nuclei. Along the posterosuperior margin of the dominant enhancing component is a confluent area of hemosiderin and wagon-wheel-spoke type appearance on precontrast T2, which is partially enhancing postcontrast (series 4, image 26). Initial coronal T2 weighted images at 0317 hours today were motion degraded but repeat coronal T2 at 0853 hours,  series 6, images 15 and 16 suggest a CSF gap between the enhancing component of the mass and adjacent left temporal lobe. And furthermore, there is strong evidence of tumor invasion through the bone of the left middle cranial fossa, into the left infratemporal fossa with solid enhancing tumor there (series 3, image 19 versus series 9, image 17) and also into the posterior left orbit (series 4, image 22). Sclerotic and mildly hyperostotic bone in these areas by CT. And there is abnormal signal in the left pterygoid and temporalis muscles. Therefore, all told the bulky left middle cranial fossa tumor is at least 62 x 56 by 69 mm (AP by transverse by CC). Furthermore there is an expansile tumor of the right mandible ramus and condyle (series 3 image 4 and series 9, image 4 estimated at 34 mm diameter and abnormally enhancing. Right mandible condyle permeated and partially destroyed by CT. Associated intracranial mass effect with rightward midline shift of 10 mm and subtotal effacement of the left lateral ventricle. No ventriculomegaly. Basilar cisterns remain patent. No restricted diffusion suggestive of infarction. No other cerebral edema identified. No other cerebral blood products identified. Vascular: Major intracranial vascular flow voids are preserved. There is mass effect on the left ICA terminus, left MCA branches. Skull and upper cervical spine: Background bone marrow signal is within normal limits. Grossly negative visible cervical spine. Sinuses/Orbits: Infiltrative tumor in the posterior left orbit, along the left lateral wall of that orbit. Underlying abnormal bone. No significant exophthalmos. No other abnormal intraorbital mass or enhancement is evident. But there is also tumor infiltration of the left sphenoid and maxillary sinuses, also confirmed by CT. Other: Mastoids remain clear. Grossly negative visible internal auditory structures. IMPRESSION: 1. Motion degraded exam. 2. 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 Estimated tumor long-axis nearly 7 cm. Confluent enhancing intracranial component is 4-5 cm. AND a second similar bony destructive and expansile enhancing Tumor of the Right Mandible Condyle is 3-4 cm. 3. Multiplicity and constellation of imaging findings favors Metastases over other considerations such as invasive Meningioma, high-grade glioma, or  Lymphoma. 4. Extensive probably vasogenic tumoral edema in the left cerebral hemisphere with intracranial mass effect including rightward midline shift of 10 mm. Basilar cisterns remain patent. No evidence of acute cerebral infarct. Electronically Signed   By: Odessa Fleming M.D.   On: 02/27/2024 09:45   DG Femur Portable 1 View Right Result Date: 02/27/2024 CLINICAL DATA:  MRI clearance EXAM: RIGHT FEMUR PORTABLE 1 VIEW COMPARISON:  None Available. FINDINGS: There is no evidence of fracture or other focal bone lesions. Soft tissues are unremarkable. No radiopaque foreign body is noted. IMPRESSION: No acute abnormality noted. Electronically Signed   By: Alcide Clever M.D.   On: 02/27/2024 03:41   DG Femur Portable 1 View Left Result Date: 02/27/2024 CLINICAL DATA:  MRI clearance EXAM: LEFT FEMUR PORTABLE 1 VIEW COMPARISON:  None Available. FINDINGS: There is no evidence of fracture or other focal bone lesions. Soft tissues are unremarkable. No foreign body seen. IMPRESSION: No acute abnormality noted. Electronically Signed   By: Alcide Clever M.D.   On: 02/27/2024 03:41   DG Humerus Right Result Date: 02/27/2024 CLINICAL DATA:  MRI screening EXAM: RIGHT HUMERUS - 2+ VIEW COMPARISON:  None Available. FINDINGS: No acute fracture is noted. A needle is noted overlying the right arm likely extrinsic to the patient. No other focal abnormality is noted. IMPRESSION: Needle is  noted over the mid upper arm likely extrinsic to the patient. Correlate with the physical exam. Electronically Signed   By: Alcide Clever M.D.   On: 02/27/2024 03:41   DG Humerus Left Result Date: 02/27/2024 CLINICAL DATA:  Screening for MRI EXAM: LEFT HUMERUS - 2+ VIEW COMPARISON:  None Available. FINDINGS: No acute fractures noted. No radiopaque foreign body is noted. Degenerative changes of the acromioclavicular joint are seen. IMPRESSION: No foreign body noted. Electronically Signed   By: Alcide Clever M.D.   On: 02/27/2024 03:39   CT ANGIO HEAD NECK W WO CM Result Date: 02/27/2024 CLINICAL DATA:  Stroke, follow up EXAM: CT ANGIOGRAPHY HEAD AND NECK WITH AND WITHOUT CONTRAST TECHNIQUE: Multidetector CT imaging of the head and neck was performed using the standard protocol during bolus administration of intravenous contrast. Multiplanar CT image reconstructions and MIPs were obtained to evaluate the vascular anatomy. Carotid stenosis measurements (when applicable) are obtained utilizing NASCET criteria, using the distal internal carotid diameter as the denominator. RADIATION DOSE REDUCTION: This exam was performed according to the departmental dose-optimization program which includes automated exposure control, adjustment of the mA and/or kV according to patient size and/or use of iterative reconstruction technique. CONTRAST:  75mL OMNIPAQUE IOHEXOL 350 MG/ML SOLN COMPARISON:  None Available. FINDINGS: CTA NECK FINDINGS Aortic arch: Great vessel origins are patent without significant stenosis. Right carotid system: No evidence of dissection, stenosis (50% or greater), or occlusion. Left carotid system: No evidence of dissection, stenosis (50% or greater), or occlusion. Vertebral arteries: Left dominant. No evidence of dissection, stenosis (50% or greater), or occlusion. Skeleton: No acute abnormality on limited assessment. Other neck: No acute abnormality on limited assessment. Upper chest: Visualized lung  apices are clear. 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 Review of the MIP images confirms the above findings CTA HEAD FINDINGS Anterior circulation: No significant stenosis, proximal occlusion, aneurysm, or vascular malformation. Posterior circulation: No significant stenosis, proximal occlusion, aneurysm, or vascular malformation. Venous sinuses: As permitted by contrast timing, patent. Review of the  MIP images confirms the above findings IMPRESSION: 1. Although incompletely assessed on this study, suspected enhancing mass in the anterior left temporal lobe which likely is the cause of the patient's extensive left cerebral edema. Recommend MRI of the head with contrast to confirm and further evaluate. 2. No large vessel occlusion or proximal hemodynamically significant stenosis. Findings discussed with Dr. Blinda Leatherwood via telephone at 2:44 a.m. Electronically Signed   By: Feliberto Harts M.D.   On: 02/27/2024 02:45   DG Chest Port 1 View Result Date: 02/26/2024 CLINICAL DATA:  Altered mental status. EXAM: PORTABLE CHEST 1 VIEW COMPARISON:  January 24, 2024 FINDINGS: The heart size and mediastinal contours are within normal limits. Both lungs are clear. Chronic and degenerative changes are seen involving the right shoulder. The visualized skeletal structures are otherwise unremarkable. IMPRESSION: No active disease. Electronically Signed   By: Aram Candela M.D.   On: 02/26/2024 23:05   CT Cervical Spine Wo Contrast Result Date: 02/26/2024 CLINICAL DATA:  Unwitnessed fall. EXAM: CT CERVICAL SPINE WITHOUT CONTRAST TECHNIQUE: Multidetector CT imaging of the cervical spine was performed without intravenous contrast. Multiplanar CT image reconstructions were also generated. RADIATION DOSE REDUCTION: This exam was performed according to the departmental  dose-optimization program which includes automated exposure control, adjustment of the mA and/or kV according to patient size and/or use of iterative reconstruction technique. COMPARISON:  None Available. FINDINGS: Alignment: Normal. Skull base and vertebrae: No acute fracture. No primary bone lesion or focal pathologic process. Soft tissues and spinal canal: No prevertebral fluid or swelling. No visible canal hematoma. Disc levels: Moderate to marked severity endplate sclerosis, anterior osteophyte formation and posterior bony spurring are seen at the level of C5-C6. Mild multilevel endplate sclerosis is seen throughout the remainder of the cervical spine. Mild intervertebral disc space narrowing is seen at the levels of C5-C6 and C6-C7. Bilateral moderate to marked severity multilevel facet joint hypertrophy is noted, right greater than left. Upper chest: Negative. Other: A 9 mm diameter thyroid nodule is seen within the left lobe of the thyroid gland. IMPRESSION: 1. No acute fracture or subluxation in the cervical spine. 2. Moderate to marked severity degenerative changes, most prominent at the level of C5-C6. 3. 9 mm diameter thyroid nodule within the left lobe of the thyroid gland. No follow-up imaging is recommended. This follows ACR consensus guidelines: Managing Incidental Thyroid Nodules Detected on Imaging: White Paper of the ACR Incidental Thyroid Findings Committee. J Am Coll Radiol 2015; 12:143-150. Electronically Signed   By: Aram Candela M.D.   On: 02/26/2024 23:04   CT Head Wo Contrast Result Date: 02/26/2024 CLINICAL DATA:  Unwitnessed fall. EXAM: CT HEAD WITHOUT CONTRAST TECHNIQUE: Contiguous axial images were obtained from the base of the skull through the vertex without intravenous contrast. RADIATION DOSE REDUCTION: This exam was performed according to the departmental dose-optimization program which includes automated exposure control, adjustment of the mA and/or kV according to patient  size and/or use of iterative reconstruction technique. COMPARISON:  None Available. FINDINGS: Brain: A large area of cortical and white matter low attenuation is seen throughout the entire left parietal lobe. There is associated mass effect on the adjacent sulci and left lateral ventricle with approximate 7 mm left-to-right midline shift. No evidence of hemorrhage, hydrocephalus or extra-axial collection or mass lesion. Vascular: No hyperdense vessel or unexpected calcification. Skull: Normal. Negative for fracture or focal lesion. Sinuses/Orbits: There is mild left maxillary sinus, left-sided sphenoid sinus and left ethmoid sinus mucosal thickening. Other: None. IMPRESSION: 1.  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 MRI correlation is recommended. 2. Associated mass effect on the adjacent sulci and left lateral ventricle with approximate 7 mm left-to-right midline shift. 3. Mild left maxillary sinus, left-sided sphenoid sinus and left ethmoid sinus disease. Electronically Signed   By: Aram Candela M.D.   On: 02/26/2024 23:00      Problem List  Principal Problem:   Brain tumor [facility]) Active Problems:   Atrial fibrillation, chronic (HCC)   Chronic anticoagulation   OSA on CPAP   Acute encephalopathy   Brain mass   Assessment: This is a 63 year old Caucasian male who is currently incarcerated in jail who was brought in after he had what sounds like a syncopal episode.  Possible seizure activity.  Evaluation revealed that he has got brain mass.  Patient was found to have a soft tissue mass between his prostate and rectum on recent CT scan done in November.  PSA was noted to be elevated at that time.  The brain mass could be a metastatic process.  Plan: Brain lesions with vasogenic edema:  MRI brain suggested multiple intracranial lesions.  Suspicious for being metastatic.  Neurosurgery and neurology following.  Patient is on Keppra.  Also on  dexamethasone.  Currently appears to be protecting his airway but he will need to be monitored closely.  Admit to progressive floor.  Acute encephalopathy:  Patient currently not very responsive.  He was given Ativan earlier today.  Started on Keppra.  Occasional apneic spells noted.  He does have a history of sleep apnea.  Discussed with nursing staff we will place him on oxygen.  Continue with Keppra for now.  EEG did not show any epileptiform activity.  Soft tissue mass between his rectum and prostate:  This was noted on CT scan done in November.  He underwent sigmoidoscopy at that time.  Biopsies were taken which were negative.  He was supposed to follow-up with urology for prostate biopsy which has not happened yet.  PSA was noted to be elevated.  We will recheck PSA.  Consideration can be given to repeating the CT scan of the abdomen pelvis once he is a bit more stable to see if there is any progression of the mass noted on previous CT scan.  Of note patient underwent a CT angiogram chest in December which did not show any obvious masses.  History of paroxysmal atrial fibrillation on anticoagulation:  Eliquis on hold.  Patient currently on Cardizem infusion due to tachyarrhythmia.  Monitor on telemetry.  Diabetes mellitus type 2:  Noted to be on oral medications prior to admission.  Currently on hold.  HbA1c was 7.5 in December.  SSI every 4 hours.  Essential hypertension:  Monitor blood pressures closely.  Normocytic anemia:  Monitor hemoglobin closely.  ADDENDUM Spoke to the nurse at the [facility].  Patient apparently takes only 2 medications which are tamsulosin as well as diltiazem.  He is not taking any anticoagulants.  Not on any other medications including medication for diabetes.  Nurse also mentions that patient has been telling them that he has a history of prostate cancer and has not received any treatment for it.  He does have a wife who is the next of kin.  Tried to contact his  wife.  Unable to reach her at any of the numbers listed in the chart.  DVT Prophylaxis: SCDs Code Status: Full code Family Communication: No family at bedside. Disposition: To be  determined Consults called: Neurosurgery and neurology Admission Status: Status is: Inpatient Remains inpatient appropriate because: Acute encephalopathy, brain mass    Severity of Illness: The appropriate patient status for this patient is INPATIENT. Inpatient status is judged to be reasonable and necessary in order to provide the required intensity of service to ensure the patient's safety. The patient's presenting symptoms, physical exam findings, and initial radiographic and laboratory data in the context of their chronic comorbidities is felt to place them at high risk for further clinical deterioration. Furthermore, it is not anticipated that the patient will be medically stable for discharge from the hospital within 2 midnights of admission.   * I certify that at the point of admission it is my clinical judgment that the patient will require inpatient hospital care spanning beyond 2 midnights from the point of admission due to high intensity of service, high risk for further deterioration and high frequency of surveillance required.*   Further management decisions will depend on results of further testing and patient's response to treatment.   Kylena Mole [facility]  [employer] on [email or web address]  02/27/2024, 12:25 PM

## 2024-02-27 NOTE — ED Provider Notes (Signed)
 Patient signed out to me.  See previous note for full details.  Pending MRI with and without contrast and then consult for admission. Physical Exam  BP 126/81   Pulse (!) 107   Temp 98 F (36.7 C)   Resp 20   SpO2 95%     Procedures  Procedures  ED Course / MDM   Clinical Course as of 02/27/24 1222  Tue Feb 27, 2024  0201 Called MRI to inform of patient's arrival in an effort to expedite imaging. [KH]  0205 Neurology paged to inform of patient arrival. Anticipate admission to medical service with ongoing neurology consultation. [KH]  0215 Patient anxious appearing on bedside assessment. Alert. Appears to have some flattening of his R nasolabial fold. Does have some trouble word finding, appears confused at times. Will pull me towards him when asking him to push my hands away, though strength seems grossly preserved. When asked how many quarters are in a dollar patient replies, "fifty". [KH]  [phone] Per jail officer at bedside, he reports confusion of patient was acute with onset around 1900 tonight. [KH]  0218 Dr. Otelia Limes aware of patient arrival. Requests he be notified when MRI is completed. [KH]  0249 MRI requesting screening imaging of long bones for completion of imaging. Xray orders placed and Xray tech contacted to relay need for STAT completion. [KH]  0340 Patient transported to MRI [KH]  0357 MRI tech reaching out for medications for sedation as patient a bit agitated and moving frequently while in the scanner. Ativan ordered. [KH]  0500 MRI with contrast added, per Neurology rec's. Dr. Otelia Limes to assess patient at bedside before patient transported back for additional imaging. [KH]  0532 Patient assessed by Neurology, Dr. Otelia Limes. Recommends NSG involvement given concern for underlying mass/lesion. Decadron also ordered for this reason. Will admit to medical service. Unassigned admission consultation placed. Patient remains alert and hemodynamically stable in the ED; clinically  appropriate for SDU.  [KH]  [phone] Per chart review, patient with hx of Afib onset in 2022. It appears he was prescribed Eliquis at least up to an ED visit in November 2024; however, this was not listed on his med list when seen in the [facility] ED in January 2025. Chart review also indicates hx of cocaine abuse. His UDS today is negative.  From what I can ascertain, there was CT head imaging completed in April 2024 at [facility]. It appears imaging at that time was negative and without evidence of hemorrhage, infarct, or mass/lesion. Unfortunately, unable to view these images as they were completed at an OSH. [KH]  [phone] Neurosurgery has reviewed initial imaging. Requests Keppra. Will see patient this AM in consultation. [KH]  [phone] Care signed out to [place], [place] at shift change.  [KH]    Clinical Course User Index [KH] Antony Madura, PA-C   Medical Decision Making Amount and/or Complexity of Data Reviewed Labs: ordered. Radiology: ordered.  Risk Prescription drug management. Decision regarding hospitalization.   MRI resulted.  Shows mass without any hemorrhage.  Discussed with neurology.  They state that patient can be admitted to hospitalist service.  Discussed with hospitalist.  They will evaluate patient for admission.       Marita [place], PA-C 02/27/24 1223    Glyn Ade, MD 02/27/24 [phone]

## 2024-02-27 NOTE — Progress Notes (Signed)
 MRI scan with contrast demonstrates enhancing mass in the anterior middle fossa with some erosion through the skull base and some possible invasion in his left cavernous sinus.  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  I am less concerned this is a glioblastoma.  At this point I would continue with his IV steroids and antiepileptics.  I plan to discuss things further with my partners with regard to possible treatment options.

## 2024-02-27 NOTE — ED Notes (Signed)
 Patient transported to CT

## 2024-02-27 NOTE — ED Provider Notes (Addendum)
 Clinical Course as of 02/27/24 9604  Tue Feb 27, 2024  0201 Called MRI to inform of patient's arrival in an effort to expedite imaging. [KH]  0205 Neurology paged to inform of patient arrival. Anticipate admission to medical service with ongoing neurology consultation. [KH]  0215 Patient anxious appearing on bedside assessment. Alert. Appears to have some flattening of his R nasolabial fold. Does have some trouble word finding, appears confused at times. Will pull me towards him when asking him to push my hands away, though strength seems grossly preserved. When asked how many quarters are in a dollar patient replies, "fifty". [KH]  [phone] Per jail officer at bedside, he reports confusion of patient was acute with onset around 1900 tonight. [KH]  0218 Dr. Otelia Limes aware of patient arrival. Requests he be notified when MRI is completed. [KH]  0249 MRI requesting screening imaging of long bones for completion of imaging. Xray orders placed and Xray tech contacted to relay need for STAT completion. [KH]  0340 Patient transported to MRI [KH]  0357 MRI tech reaching out for medications for sedation as patient a bit agitated and moving frequently while in the scanner. Ativan ordered. [KH]  0500 MRI with contrast added, per Neurology rec's. Dr. Otelia Limes to assess patient at bedside before patient transported back for additional imaging. [KH]  0532 Patient assessed by Neurology, Dr. Otelia Limes. Recommends NSG involvement given concern for underlying mass/lesion. Decadron also ordered for this reason. Will admit to medical service. Unassigned admission consultation placed. Patient remains alert and hemodynamically stable in the ED; clinically appropriate for SDU.  [KH]  [phone] Per chart review, patient with hx of Afib onset in 2022. It appears he was prescribed Eliquis at least up to an ED visit in November 2024; however, this was not listed on his med list when seen in the [facility] ED in January 2025. Chart review also  indicates hx of cocaine abuse. His UDS today is negative.  From what I can ascertain, there was CT head imaging completed in April 2024 at [facility]. It appears imaging at that time was negative and without evidence of hemorrhage, infarct, or mass/lesion. Unfortunately, unable to view these images as they were completed at an OSH. [KH]  [phone] Neurosurgery has reviewed initial imaging. Requests Keppra. Will see patient this AM in consultation. [KH]  [phone] Care signed out to [place], [place] at shift change.  [KH]    Clinical Course User Index [KH] Antony Madura, PA-C    Results for orders placed or performed during the hospital encounter of 02/26/24  Rapid urine drug screen (hospital performed)   Collection Time: 02/26/24  7:43 PM  Result Value Ref Range   Opiates NONE DETECTED NONE DETECTED   Cocaine NONE DETECTED NONE DETECTED   Benzodiazepines NONE DETECTED NONE DETECTED   Amphetamines NONE DETECTED NONE DETECTED   Tetrahydrocannabinol NONE DETECTED NONE DETECTED   Barbiturates NONE DETECTED NONE DETECTED  Resp panel by RT-PCR (RSV, Flu A&B, Covid) Anterior Nasal Swab   Collection Time: 02/26/24  7:44 PM   Specimen: Anterior Nasal Swab  Result Value Ref Range   SARS Coronavirus 2 by RT PCR NEGATIVE NEGATIVE   Influenza A by PCR NEGATIVE NEGATIVE   Influenza B by PCR NEGATIVE NEGATIVE   Resp Syncytial Virus by PCR NEGATIVE NEGATIVE  CBG monitoring, ED   Collection Time: 02/26/24  7:45 PM  Result Value Ref Range   Glucose-Capillary 168 (H) 70 - 99 mg/dL  Basic metabolic panel   Collection Time: 02/26/24  8:13 PM  Result Value Ref Range   Sodium 138 135 - 145 mmol/L   Potassium 4.1 3.5 - 5.1 mmol/L   Chloride 102 98 - 111 mmol/L   CO2 24 22 - 32 mmol/L   Glucose, Bld 196 (H) 70 - 99 mg/dL   BUN 13 8 - 23 mg/dL   Creatinine, Ser 1.61 (H) 0.61 - 1.24 mg/dL   Calcium 9.5 8.9 - 09.6 mg/dL   GFR, Estimated 57 (L) >60 mL/min   Anion gap 12 5 - 15  CBC   Collection Time: 02/26/24  8:13 PM   Result Value Ref Range   WBC 12.0 (H) 4.0 - 10.5 K/uL   RBC 4.47 4.22 - 5.81 MIL/uL   Hemoglobin 11.2 (L) 13.0 - 17.0 g/dL   HCT 04.5 (L) 40.9 - 81.1 %   MCV 81.0 80.0 - 100.0 fL   MCH 25.1 (L) 26.0 - 34.0 pg   MCHC 30.9 30.0 - 36.0 g/dL   RDW 91.4 78.2 - 95.6 %   Platelets 437 (H) 150 - 400 K/uL   nRBC 0.0 0.0 - 0.2 %  Ammonia   Collection Time: 02/26/24  8:13 PM  Result Value Ref Range   Ammonia 13 9 - 35 umol/L  Troponin I (High Sensitivity)   Collection Time: 02/26/24  8:13 PM  Result Value Ref Range   Troponin I (High Sensitivity) 10 <18 ng/L  Urinalysis, Routine w reflex microscopic -Urine, Clean Catch   Collection Time: 02/26/24  8:23 PM  Result Value Ref Range   Color, Urine YELLOW YELLOW   APPearance HAZY (A) CLEAR   Specific Gravity, Urine 1.019 1.005 - 1.030   pH 5.0 5.0 - 8.0   Glucose, UA 50 (A) NEGATIVE mg/dL   Hgb urine dipstick NEGATIVE NEGATIVE   Bilirubin Urine NEGATIVE NEGATIVE   Ketones, ur NEGATIVE NEGATIVE mg/dL   Protein, ur 213 (A) NEGATIVE mg/dL   Nitrite NEGATIVE NEGATIVE   Leukocytes,Ua NEGATIVE NEGATIVE   RBC / HPF 0-5 0 - 5 RBC/hpf   WBC, UA 0-5 0 - 5 WBC/hpf   Bacteria, UA NONE SEEN NONE SEEN   Squamous Epithelial / HPF 0-5 0 - 5 /HPF   Mucus PRESENT    Hyaline Casts, UA PRESENT   Troponin I (High Sensitivity)   Collection Time: 02/26/24  9:49 PM  Result Value Ref Range   Troponin I (High Sensitivity) 10 <18 ng/L   DG Femur Portable 1 View Right Result Date: 02/27/2024 CLINICAL DATA:  MRI clearance EXAM: RIGHT FEMUR PORTABLE 1 VIEW COMPARISON:  None Available. FINDINGS: There is no evidence of fracture or other focal bone lesions. Soft tissues are unremarkable. No radiopaque foreign body is noted. IMPRESSION: No acute abnormality noted. Electronically Signed   By: Alcide Clever M.D.   On: 02/27/2024 03:41   DG Femur Portable 1 View Left Result Date: 02/27/2024 CLINICAL DATA:  MRI clearance EXAM: LEFT FEMUR PORTABLE 1 VIEW COMPARISON:  None  Available. FINDINGS: There is no evidence of fracture or other focal bone lesions. Soft tissues are unremarkable. No foreign body seen. IMPRESSION: No acute abnormality noted. Electronically Signed   By: Alcide Clever M.D.   On: 02/27/2024 03:41   DG Humerus Right Result Date: 02/27/2024 CLINICAL DATA:  MRI screening EXAM: RIGHT HUMERUS - 2+ VIEW COMPARISON:  None Available. FINDINGS: No acute fracture is noted. A needle is noted overlying the right arm likely extrinsic to the patient. No other focal abnormality is noted. IMPRESSION: Needle is noted  over the mid upper arm likely extrinsic to the patient. Correlate with the physical exam. Electronically Signed   By: Alcide Clever M.D.   On: 02/27/2024 03:41   DG Humerus Left Result Date: 02/27/2024 CLINICAL DATA:  Screening for MRI EXAM: LEFT HUMERUS - 2+ VIEW COMPARISON:  None Available. FINDINGS: No acute fractures noted. No radiopaque foreign body is noted. Degenerative changes of the acromioclavicular joint are seen. IMPRESSION: No foreign body noted. Electronically Signed   By: Alcide Clever M.D.   On: 02/27/2024 03:39   CT ANGIO HEAD NECK W WO CM Result Date: 02/27/2024 CLINICAL DATA:  Stroke, follow up EXAM: CT ANGIOGRAPHY HEAD AND NECK WITH AND WITHOUT CONTRAST TECHNIQUE: Multidetector CT imaging of the head and neck was performed using the standard protocol during bolus administration of intravenous contrast. Multiplanar CT image reconstructions and MIPs were obtained to evaluate the vascular anatomy. Carotid stenosis measurements (when applicable) are obtained utilizing NASCET criteria, using the distal internal carotid diameter as the denominator. RADIATION DOSE REDUCTION: This exam was performed according to the departmental dose-optimization program which includes automated exposure control, adjustment of the mA and/or kV according to patient size and/or use of iterative reconstruction technique. CONTRAST:  75mL OMNIPAQUE IOHEXOL 350 MG/ML SOLN  COMPARISON:  None Available. FINDINGS: CTA NECK FINDINGS Aortic arch: Great vessel origins are patent without significant stenosis. Right carotid system: No evidence of dissection, stenosis (50% or greater), or occlusion. Left carotid system: No evidence of dissection, stenosis (50% or greater), or occlusion. Vertebral arteries: Left dominant. No evidence of dissection, stenosis (50% or greater), or occlusion. Skeleton: No acute abnormality on limited assessment. Other neck: No acute abnormality on limited assessment. Upper chest: Visualized lung apices are clear. 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 Review of the MIP images confirms the above findings CTA HEAD FINDINGS Anterior circulation: No significant stenosis, proximal occlusion, aneurysm, or vascular malformation. Posterior circulation: No significant stenosis, proximal occlusion, aneurysm, or vascular malformation. Venous sinuses: As permitted by contrast timing, patent. Review of the MIP images confirms the above findings IMPRESSION: 1. Although incompletely assessed on this study, suspected enhancing mass in the anterior left temporal lobe which likely is the cause of the patient's extensive left cerebral edema. Recommend MRI of the head with contrast to confirm and further evaluate. 2. No large vessel occlusion or proximal hemodynamically significant stenosis. Findings discussed with Dr. Blinda Leatherwood via telephone at 2:44 a.m. Electronically Signed   By: Feliberto Harts M.D.   On: 02/27/2024 02:45   DG Chest Port 1 View Result Date: 02/26/2024 CLINICAL DATA:  Altered mental status. EXAM: PORTABLE CHEST 1 VIEW COMPARISON:  January 24, 2024 FINDINGS: The heart size and mediastinal contours are within normal limits. Both lungs are clear. Chronic and degenerative changes are seen involving the right shoulder. The  visualized skeletal structures are otherwise unremarkable. IMPRESSION: No active disease. Electronically Signed   By: Aram Candela M.D.   On: 02/26/2024 23:05   CT Cervical Spine Wo Contrast Result Date: 02/26/2024 CLINICAL DATA:  Unwitnessed fall. EXAM: CT CERVICAL SPINE WITHOUT CONTRAST TECHNIQUE: Multidetector CT imaging of the cervical spine was performed without intravenous contrast. Multiplanar CT image reconstructions were also generated. RADIATION DOSE REDUCTION: This exam was performed according to the departmental dose-optimization program which includes automated exposure control, adjustment of the mA and/or kV according to patient size and/or use of iterative reconstruction  technique. COMPARISON:  None Available. FINDINGS: Alignment: Normal. Skull base and vertebrae: No acute fracture. No primary bone lesion or focal pathologic process. Soft tissues and spinal canal: No prevertebral fluid or swelling. No visible canal hematoma. Disc levels: Moderate to marked severity endplate sclerosis, anterior osteophyte formation and posterior bony spurring are seen at the level of C5-C6. Mild multilevel endplate sclerosis is seen throughout the remainder of the cervical spine. Mild intervertebral disc space narrowing is seen at the levels of C5-C6 and C6-C7. Bilateral moderate to marked severity multilevel facet joint hypertrophy is noted, right greater than left. Upper chest: Negative. Other: A 9 mm diameter thyroid nodule is seen within the left lobe of the thyroid gland. IMPRESSION: 1. No acute fracture or subluxation in the cervical spine. 2. Moderate to marked severity degenerative changes, most prominent at the level of C5-C6. 3. 9 mm diameter thyroid nodule within the left lobe of the thyroid gland. No follow-up imaging is recommended. This follows ACR consensus guidelines: Managing Incidental Thyroid Nodules Detected on Imaging: White Paper of the ACR Incidental Thyroid Findings Committee. J Am Coll  Radiol 2015; 12:143-150. Electronically Signed   By: Aram Candela M.D.   On: 02/26/2024 23:04   CT Head Wo Contrast Result Date: 02/26/2024 CLINICAL DATA:  Unwitnessed fall. EXAM: CT HEAD WITHOUT CONTRAST TECHNIQUE: Contiguous axial images were obtained from the base of the skull through the vertex without intravenous contrast. RADIATION DOSE REDUCTION: This exam was performed according to the departmental dose-optimization program which includes automated exposure control, adjustment of the mA and/or kV according to patient size and/or use of iterative reconstruction technique. COMPARISON:  None Available. FINDINGS: Brain: A large area of cortical and white matter low attenuation is seen throughout the entire left parietal lobe. There is associated mass effect on the adjacent sulci and left lateral ventricle with approximate 7 mm left-to-right midline shift. No evidence of hemorrhage, hydrocephalus or extra-axial collection or mass lesion. Vascular: No hyperdense vessel or unexpected calcification. Skull: Normal. Negative for fracture or focal lesion. Sinuses/Orbits: There is mild left maxillary sinus, left-sided sphenoid sinus and left ethmoid sinus mucosal thickening. Other: None. IMPRESSION: 1. 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 MRI correlation is recommended. 2. Associated mass effect on the adjacent sulci and left lateral ventricle with approximate 7 mm left-to-right midline shift. 3. Mild left maxillary sinus, left-sided sphenoid sinus and left ethmoid sinus disease. Electronically Signed   By: Aram Candela M.D.   On: 02/26/2024 23:00    .Critical Care  Performed by: Antony Madura, PA-C Authorized by: Antony Madura, PA-C   Critical care provider statement:    Critical care time (minutes):  75   Critical care time was exclusive of:  Separately billable procedures and treating other patients   Critical care was necessary to treat or  prevent imminent or life-threatening deterioration of the following conditions:  CNS failure or compromise and cardiac failure   Critical care was time spent personally by me on the following activities:  Development of treatment plan with patient or surrogate, discussions with consultants, evaluation of patient's response to treatment, examination of patient, ordering and review of laboratory studies, ordering and review of radiographic studies, ordering and performing treatments and interventions, pulse oximetry, re-evaluation of patient's condition, review of old charts and obtaining history from patient or surrogate   Care discussed with: admitting provider        Antony Madura, PA-C 02/27/24 0629    Antony Madura, PA-C  02/27/24 1610    Anders Simmonds T, DO 02/27/24 0700

## 2024-02-27 NOTE — ED Notes (Signed)
 Transfer from [facility] for MRI. Per report patient was talking with guarding and then passed out. CT scan concerned for stroke, MRI to r/o tumor. Reports patient on compliant with medications.

## 2024-02-27 NOTE — ED Notes (Signed)
 Returned from MRI

## 2024-02-27 NOTE — ED Notes (Signed)
 Patient back from CT.

## 2024-02-27 NOTE — ED Provider Notes (Incomplete)
 [facility]PART[facility] AT [facility] Provider Note   CSN: [id number] Arrival date & time: 02/26/24  1922     History {Add pertinent medical, surgical, social history, OB history to HPI:1} Chief Complaint  Patient presents with  . Altered Mental Status    Thomas Donovan is a 63 y.o. male.  63 year old male with past medical history of hypertension and hyperlipidemia presenting to the emergency department today after an unwitnessed fall [facility] jail.  The patient apparently was found down.  He does have Eliquis on his medication list here but looking at the records sent over from jail it does not appear that he is on this currently.  The patient is unable to provide any history.  Is unclear when his last known normal was.  The officer with him does not have much more history to add.   Altered Mental Status      Home Medications Prior to Admission medications   Medication Sig Start Date End Date Taking? Authorizing Provider  allopurinol (ZYLOPRIM) 300 MG tablet Take 1 tablet (300 mg total) by mouth daily. 12/01/23  Yes Vassie Loll, MD  atorvastatin (LIPITOR) 40 MG tablet Take 1 tablet (40 mg total) by mouth daily. 10/28/23 02/26/24 Yes Shah, Pratik D, DO  colchicine 0.6 MG tablet Take 1 tablet (0.6 mg total) by mouth 2 (two) times daily. 11/30/23  Yes Vassie Loll, MD  diltiazem (CARDIZEM CD) 180 MG 24 hr capsule Take 180 mg by mouth daily.   Yes [provider]  ELIQUIS 5 MG TABS tablet Take 1 tablet (5 mg total) by mouth 2 (two) times daily. 10/28/23  Yes Shah, Pratik D, DO  esomeprazole (NEXIUM) 40 MG capsule Take 1 capsule (40 mg total) by mouth daily. 10/28/23  Yes Shah, Pratik D, DO  famotidine (PEPCID) 20 MG tablet Take 20 mg by mouth 2 (two) times daily.   Yes [provider]  fenofibrate micronized (LOFIBRA) 134 MG capsule Take 1 capsule (134 mg total) by mouth daily. 10/28/23  Yes Shah, Pratik D, DO  finasteride (PROSCAR) 5 MG tablet Take 1  tablet (5 mg total) by mouth daily. 10/28/23 10/27/24 Yes Shah, Pratik D, DO  folic acid (FOLVITE) 1 MG tablet Take 1 tablet (1 mg total) by mouth daily. 12/01/23  Yes Vassie Loll, MD  furosemide (LASIX) 40 MG tablet Take 1 tablet (40 mg total) by mouth daily. 10/28/23  Yes Shah, Pratik D, DO  glipiZIDE (GLUCOTROL) 10 MG tablet Take 2 tablets (20 mg total) by mouth daily. 10/28/23  Yes Shah, Pratik D, DO  HYDROcodone-acetaminophen (NORCO/VICODIN) 5-325 MG tablet Take 2 tablets by mouth every 6 (six) hours as needed. 01/01/24  Yes Sherian Maroon A, PA  metFORMIN (GLUCOPHAGE) 850 MG tablet Take 1 tablet (850 mg total) by mouth every morning. 11/24/23 02/26/24 Yes Royanne Foots, DO  phenazopyridine (PYRIDIUM) 200 MG tablet Take 1 tablet (200 mg total) by mouth 3 (three) times daily. 01/01/24  Yes Sherian Maroon A, PA  promethazine (PHENERGAN) 25 MG tablet Take 25 mg by mouth every 6 (six) hours as needed. 12/19/23  Yes [provider]  tamsulosin (FLOMAX) 0.4 MG CAPS capsule Take 1 capsule (0.4 mg total) by mouth daily. 10/28/23  Yes Shah, Pratik D, DO  thiamine (VITAMIN B-1) 100 MG tablet Take 1 tablet (100 mg total) by mouth daily. 12/01/23  Yes Vassie Loll, MD  TRELEGY ELLIPTA 100-62.5-25 MCG/ACT AEPB Inhale 1 puff into the lungs daily. 10/28/23  Yes Sherryll Burger, Pratik  D, DO  albuterol (VENTOLIN HFA) 108 (90 Base) MCG/ACT inhaler Inhale 2 puffs into the lungs every 6 (six) hours as needed for wheezing or shortness of breath. 11/24/23   Royanne Foots, DO  levofloxacin (LEVAQUIN) 500 MG tablet Take 500 mg by mouth daily. Patient not taking: Reported on 02/26/2024 12/15/23   [provider]  nitrofurantoin, macrocrystal-monohydrate, (MACROBID) 100 MG capsule Take 100 mg by mouth 2 (two) times daily. 12/19/23   [provider]  tamsulosin (FLOMAX) 0.4 MG CAPS capsule Take 1 capsule (0.4 mg total) by mouth daily. 01/01/24   Peter Garter, PA      Allergies    Celebrex [celecoxib],  Guaifenesin, Ibuprofen, Tramadol, Acetaminophen, Aspirin, Ketorolac, and Toradol [ketorolac tromethamine]    Review of Systems   Review of Systems  Reason unable to perform ROS: Altered mental status?, poor historian.  All other systems reviewed and are negative.   Physical Exam Updated Vital Signs BP (!) 146/91   Pulse (!) 105   Temp 98.3 F (36.8 C) (Oral)   Resp 14   SpO2 96%  Physical Exam Vitals and nursing note reviewed.   Gen: NAD Eyes: PERRL, EOMI HEENT: no oropharyngeal swelling Neck: trachea midline Resp: clear to auscultation bilaterally Card: RRR, no murmurs, rubs, or gallops Abd: nontender, nondistended Extremities: no calf tenderness, no edema Vascular: 2+ radial pulses bilaterally, 2+ DP pulses bilaterally Neuro: NIH stroke scale of 2 for questionable expressive aphasia and questionable left-sided facial droop, the patient does seem to have equal strength and sensation throughout the bilateral upper and lower extremities although he is not the most compliant with testing Skin: no rashes  ED Results / Procedures / Treatments   Labs (all labs ordered are listed, but only abnormal results are displayed) Labs Reviewed  BASIC METABOLIC PANEL - Abnormal; Notable for the following components:      Result Value   Glucose, Bld 196 (*)    Creatinine, Ser 1.40 (*)    GFR, Estimated 57 (*)    All other components within normal limits  CBC - Abnormal; Notable for the following components:   WBC 12.0 (*)    Hemoglobin 11.2 (*)    HCT 36.2 (*)    MCH 25.1 (*)    Platelets 437 (*)    All other components within normal limits  URINALYSIS, ROUTINE W REFLEX MICROSCOPIC - Abnormal; Notable for the following components:   APPearance HAZY (*)    Glucose, UA 50 (*)    Protein, ur 100 (*)    All other components within normal limits  CBG MONITORING, ED - Abnormal; Notable for the following components:   Glucose-Capillary 168 (*)    All other components within normal  limits  RESP PANEL BY RT-PCR (RSV, FLU A&B, COVID)  [id number]  RAPID URINE DRUG SCREEN, HOSP PERFORMED  AMMONIA  TROPONIN I (HIGH SENSITIVITY)  TROPONIN I (HIGH SENSITIVITY)    EKG None  Radiology DG Chest Port 1 View Result Date: 02/26/2024 CLINICAL DATA:  Altered mental status. EXAM: PORTABLE CHEST 1 VIEW COMPARISON:  January 24, 2024 FINDINGS: The heart size and mediastinal contours are within normal limits. Both lungs are clear. Chronic and degenerative changes are seen involving the right shoulder. The visualized skeletal structures are otherwise unremarkable. IMPRESSION: No active disease. Electronically Signed   By: Aram Candela M.D.   On: 02/26/2024 23:05   CT Cervical Spine Wo Contrast Result Date: 02/26/2024 CLINICAL DATA:  Unwitnessed fall. EXAM: CT CERVICAL SPINE WITHOUT CONTRAST  TECHNIQUE: Multidetector CT imaging of the cervical spine was performed without intravenous contrast. Multiplanar CT image reconstructions were also generated. RADIATION DOSE REDUCTION: This exam was performed according to the departmental dose-optimization program which includes automated exposure control, adjustment of the mA and/or kV according to patient size and/or use of iterative reconstruction technique. COMPARISON:  None Available. FINDINGS: Alignment: Normal. Skull base and vertebrae: No acute fracture. No primary bone lesion or focal pathologic process. Soft tissues and spinal canal: No prevertebral fluid or swelling. No visible canal hematoma. Disc levels: Moderate to marked severity endplate sclerosis, anterior osteophyte formation and posterior bony spurring are seen at the level of C5-C6. Mild multilevel endplate sclerosis is seen throughout the remainder of the cervical spine. Mild intervertebral disc space narrowing is seen at the levels of C5-C6 and C6-C7. Bilateral moderate to marked severity multilevel facet joint hypertrophy is noted, right greater than left. Upper chest: Negative. Other: A 9  mm diameter thyroid nodule is seen within the left lobe of the thyroid gland. IMPRESSION: 1. No acute fracture or subluxation in the cervical spine. 2. Moderate to marked severity degenerative changes, most prominent at the level of C5-C6. 3. 9 mm diameter thyroid nodule within the left lobe of the thyroid gland. No follow-up imaging is recommended. This follows ACR consensus guidelines: Managing Incidental Thyroid Nodules Detected on Imaging: White Paper of the ACR Incidental Thyroid Findings Committee. J Am Coll Radiol 2015; 12:143-150. Electronically Signed   By: Aram Candela M.D.   On: 02/26/2024 23:04   CT Head Wo Contrast Result Date: 02/26/2024 CLINICAL DATA:  Unwitnessed fall. EXAM: CT HEAD WITHOUT CONTRAST TECHNIQUE: Contiguous axial images were obtained from the base of the skull through the vertex without intravenous contrast. RADIATION DOSE REDUCTION: This exam was performed according to the departmental dose-optimization program which includes automated exposure control, adjustment of the mA and/or kV according to patient size and/or use of iterative reconstruction technique. COMPARISON:  None Available. FINDINGS: Brain: A large area of cortical and white matter low attenuation is seen throughout the entire left parietal lobe. There is associated mass effect on the adjacent sulci and left lateral ventricle with approximate 7 mm left-to-right midline shift. No evidence of hemorrhage, hydrocephalus or extra-axial collection or mass lesion. Vascular: No hyperdense vessel or unexpected calcification. Skull: Normal. Negative for fracture or focal lesion. Sinuses/Orbits: There is mild left maxillary sinus, left-sided sphenoid sinus and left ethmoid sinus mucosal thickening. Other: None. IMPRESSION: 1. 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 MRI correlation is recommended. 2. Associated mass effect on the adjacent sulci and left lateral ventricle with  approximate 7 mm left-to-right midline shift. 3. Mild left maxillary sinus, left-sided sphenoid sinus and left ethmoid sinus disease. Electronically Signed   By: Aram Candela M.D.   On: 02/26/2024 23:00    Procedures Procedures  {Document cardiac monitor, telemetry assessment procedure when appropriate:1}  Medications Ordered in ED Medications  diltiazem (CARDIZEM) 1 mg/mL load via infusion 10 mg (10 mg Intravenous Bolus from Bag 02/26/24 2237)    And  diltiazem (CARDIZEM) 125 mg in dextrose 5% 125 mL (1 mg/mL) infusion (10 mg/hr Intravenous Rate/Dose Change 02/26/24 2346)  iohexol (OMNIPAQUE) 350 MG/ML injection 75 mL (has no administration in time range)    ED Course/ Medical Decision Making/ A&P   {   Click here for ABCD2, HEART and other calculatorsREFRESH Note before signing :1}  Medical Decision Making 63 year old male with past medical history of diabetes, hypertension, and hyperlipidemia presents the emergency department today with a fall and mental status change.  To his baseline.  Is difficult to get much history out of the patient here.  He does not have any findings on exam to suggest large vessel occlusion at this time.  I am unclear of last known normal and we did try to call the jail with nursing staff and no one there seems to know when his last known normal with is.  I will further evaluate him here with basic labs evaluate for electrolyte abnormalities.  Will obtain a COVID and flu swab on the patient as well as an ammonia level and UDS.  Also obtain a CT scan of his head and cervical spine for evaluation for acute traumatic injuries or intracranial hemorrhage.  If he continues to be off his baseline we will consider MRI but at this time I do not see any obvious findings on exam to suggest stroke.  The patient is not really cooperating with full cranial nerve testing.  He has poor dentition and is unclear if the facial droop is from this.  When he  grimaces when IV is placed it does not appear that he has an obvious droop.  The patient CT scan shows concerns for a large stroke versus possible mass lesion.  I did call and discussed this with Dr. Otelia Limes.  He recommends that we send the patient ER to ER for stat MRI as we do not have MRI here at this location currently.  Dr. Andria Meuse accepts the patient in [place] for.  The patient was in atrial fibrillation with RVR.  He was started on Cardizem bolus and infusion with improvement in his heart rate to the low 100s.  He will be transferred to [facility] for further evaluation management.  CRITICAL CARE Performed by: Durwin Glaze   Total critical care time: 50 minutes  Critical care time was exclusive of separately billable procedures and treating other patients.  Critical care was necessary to treat or prevent imminent or life-threatening deterioration.  Critical care was time spent personally by me on the following activities: development of treatment plan with patient and/or surrogate as well as nursing, discussions with consultants, evaluation of patient's response to treatment, examination of patient, obtaining history from patient or surrogate, ordering and performing treatments and interventions, ordering and review of laboratory studies, ordering and review of radiographic studies, pulse oximetry and re-evaluation of patient's condition.   His initial EKG interpreted by me shows atrial fibrillation with RVR with a rate of 139 with nonspecific ST-T changes.  His heart rate in the room has improved to a normal rate  Amount and/or Complexity of Data Reviewed Labs: ordered. Radiology: ordered.  Risk Prescription drug management.    {Document critical care time when appropriate:1} {Document review of labs and clinical decision tools ie heart score, Chads2Vasc2 etc:1}  {Document your independent review of radiology images, and any outside records:1} {Document your discussion with  family members, caretakers, and with consultants:1} {Document social determinants of health affecting pt's care:1} {Document your decision making why or why not admission, treatments were needed:1} Final Clinical Impression(s) / ED Diagnoses Final diagnoses:  None    Rx / DC Orders ED Discharge Orders     None

## 2024-02-27 NOTE — Progress Notes (Signed)
 EEG complete - results pending

## 2024-02-27 NOTE — Progress Notes (Signed)
 Pt has been moved to a hallway, messaged RN to see if he can be put back in his room to have his STAT EEG

## 2024-02-27 NOTE — Progress Notes (Signed)
 Per Celina at MRI, pt is currently at MRI now.
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
## 2024-02-28 ENCOUNTER — Encounter (HOSPITAL_COMMUNITY): Payer: Self-pay | Admitting: Internal Medicine

## 2024-02-28 ENCOUNTER — Other Ambulatory Visit: Payer: Self-pay

## 2024-02-28 ENCOUNTER — Inpatient Hospital Stay (HOSPITAL_COMMUNITY)

## 2024-02-28 DIAGNOSIS — G939 Disorder of brain, unspecified: Secondary | ICD-10-CM

## 2024-02-28 DIAGNOSIS — R4701 Aphasia: Secondary | ICD-10-CM | POA: Diagnosis not present

## 2024-02-28 DIAGNOSIS — G9389 Other specified disorders of brain: Secondary | ICD-10-CM

## 2024-02-28 DIAGNOSIS — G936 Cerebral edema: Secondary | ICD-10-CM

## 2024-02-28 DIAGNOSIS — G9340 Encephalopathy, unspecified: Secondary | ICD-10-CM | POA: Diagnosis not present

## 2024-02-28 DIAGNOSIS — D496 Neoplasm of unspecified behavior of brain: Secondary | ICD-10-CM | POA: Diagnosis not present

## 2024-02-28 DIAGNOSIS — Z8546 Personal history of malignant neoplasm of prostate: Secondary | ICD-10-CM | POA: Diagnosis not present

## 2024-02-28 DIAGNOSIS — G4733 Obstructive sleep apnea (adult) (pediatric): Secondary | ICD-10-CM

## 2024-02-28 DIAGNOSIS — I482 Chronic atrial fibrillation, unspecified: Secondary | ICD-10-CM | POA: Diagnosis not present

## 2024-02-28 LAB — RAPID URINE DRUG SCREEN, HOSP PERFORMED
Amphetamines: NOT DETECTED
Barbiturates: NOT DETECTED
Benzodiazepines: NOT DETECTED
Cocaine: NOT DETECTED
Opiates: NOT DETECTED
Tetrahydrocannabinol: NOT DETECTED

## 2024-02-28 LAB — COMPREHENSIVE METABOLIC PANEL
ALT: 16 U/L (ref 0–44)
AST: 14 U/L — ABNORMAL LOW (ref 15–41)
Albumin: 2.5 g/dL — ABNORMAL LOW (ref 3.5–5.0)
Alkaline Phosphatase: 105 U/L (ref 38–126)
Anion gap: 10 (ref 5–15)
BUN: 26 mg/dL — ABNORMAL HIGH (ref 8–23)
CO2: 20 mmol/L — ABNORMAL LOW (ref 22–32)
Calcium: 8.7 mg/dL — ABNORMAL LOW (ref 8.9–10.3)
Chloride: 102 mmol/L (ref 98–111)
Creatinine, Ser: 1.64 mg/dL — ABNORMAL HIGH (ref 0.61–1.24)
GFR, Estimated: 47 mL/min — ABNORMAL LOW (ref >60)
Glucose, Bld: 341 mg/dL — ABNORMAL HIGH (ref 70–99)
Potassium: 3.8 mmol/L (ref 3.5–5.1)
Sodium: 132 mmol/L — ABNORMAL LOW (ref 135–145)
Total Bilirubin: 0.6 mg/dL (ref 0.0–1.2)
Total Protein: 6 g/dL — ABNORMAL LOW (ref 6.5–8.1)

## 2024-02-28 LAB — CBC
HCT: 32.3 % — ABNORMAL LOW (ref 39.0–52.0)
Hemoglobin: 10.2 g/dL — ABNORMAL LOW (ref 13.0–17.0)
MCH: 25 pg — ABNORMAL LOW (ref 26.0–34.0)
MCHC: 31.6 g/dL (ref 30.0–36.0)
MCV: 79.2 fL — ABNORMAL LOW (ref 80.0–100.0)
Platelets: 435 10*3/uL — ABNORMAL HIGH (ref 150–400)
RBC: 4.08 MIL/uL — ABNORMAL LOW (ref 4.22–5.81)
RDW: 14.9 % (ref 11.5–15.5)
WBC: 9.8 10*3/uL (ref 4.0–10.5)
nRBC: 0 % (ref 0.0–0.2)

## 2024-02-28 LAB — GLUCOSE, CAPILLARY
Glucose-Capillary: 362 mg/dL — ABNORMAL HIGH (ref 70–99)
Glucose-Capillary: 263 mg/dL — ABNORMAL HIGH (ref 70–99)

## 2024-02-28 LAB — TSH: TSH: 2.35 u[IU]/mL (ref 0.350–4.500)

## 2024-02-28 LAB — BLOOD GAS, VENOUS
Acid-base deficit: 0.6 mmol/L (ref 0.0–2.0)
Bicarbonate: 23.2 mmol/L (ref 20.0–28.0)
Drawn by: 8168
O2 Saturation: 100 %
Patient temperature: 36.5
pCO2, Ven: 34 mmHg — ABNORMAL LOW (ref 44–60)
pH, Ven: 7.44 — ABNORMAL HIGH (ref 7.25–7.43)
pO2, Ven: 159 mmHg — ABNORMAL HIGH (ref 32–45)

## 2024-02-28 LAB — AMMONIA: Ammonia: 15 umol/L (ref 9–35)

## 2024-02-28 LAB — CBG MONITORING, ED
Glucose-Capillary: 258 mg/dL — ABNORMAL HIGH (ref 70–99)
Glucose-Capillary: 278 mg/dL — ABNORMAL HIGH (ref 70–99)
Glucose-Capillary: 296 mg/dL — ABNORMAL HIGH (ref 70–99)
Glucose-Capillary: 318 mg/dL — ABNORMAL HIGH (ref 70–99)

## 2024-02-28 LAB — PSA: Prostatic Specific Antigen: 176.24 ng/mL — ABNORMAL HIGH (ref 0.00–4.00)

## 2024-02-28 LAB — VITAMIN B12: Vitamin B-12: 331 pg/mL (ref 180–914)

## 2024-02-28 MED ORDER — IOHEXOL 9 MG/ML PO SOLN
500.0000 mL | ORAL | Status: AC
Start: 2024-02-28 — End: 2024-02-28
  Administered 2024-02-28 (×2): 500 mL via ORAL

## 2024-02-28 MED ORDER — QUETIAPINE FUMARATE 25 MG PO TABS
25.0000 mg | ORAL_TABLET | Freq: Every day | ORAL | Status: DC
  Administered 2024-02-28 – 2024-03-02 (×4): 25 mg via ORAL
  Filled 2024-02-28 (×4): qty 1

## 2024-02-28 MED ORDER — HALOPERIDOL LACTATE 5 MG/ML IJ SOLN
2.0000 mg | Freq: Four times a day (QID) | INTRAMUSCULAR | Status: DC | PRN
Start: 2024-02-28 — End: 2024-02-29
  Administered 2024-02-28: 2 mg via INTRAVENOUS
  Filled 2024-02-28: qty 1

## 2024-02-28 MED ORDER — INSULIN GLARGINE 100 UNIT/ML ~~LOC~~ SOLN
15.0000 [IU] | Freq: Two times a day (BID) | SUBCUTANEOUS | Status: DC
  Administered 2024-02-28 – 2024-02-29 (×2): 15 [IU] via SUBCUTANEOUS
  Filled 2024-02-28 (×3): qty 0.15

## 2024-02-28 MED ORDER — IOHEXOL 350 MG/ML SOLN
75.0000 mL | Freq: Once | INTRAVENOUS | Status: AC | PRN
  Administered 2024-02-28: 75 mL via INTRAVENOUS

## 2024-02-28 MED ORDER — HYDROMORPHONE HCL 1 MG/ML IJ SOLN
0.5000 mg | INTRAMUSCULAR | Status: DC | PRN
  Administered 2024-03-01 – 2024-03-02 (×5): 0.5 mg via INTRAVENOUS
  Filled 2024-02-28 (×5): qty 0.5

## 2024-02-28 MED ORDER — DILTIAZEM HCL-DEXTROSE 125-5 MG/125ML-% IV SOLN (PREMIX)
5.0000 mg/h | INTRAVENOUS | Status: DC
  Filled 2024-02-28: qty 125

## 2024-02-28 MED ORDER — PANTOPRAZOLE SODIUM 40 MG PO TBEC
40.0000 mg | DELAYED_RELEASE_TABLET | Freq: Every day | ORAL | Status: DC
  Administered 2024-02-28 – 2024-03-01 (×3): 40 mg via ORAL
  Filled 2024-02-28 (×3): qty 1

## 2024-02-28 MED ORDER — INSULIN ASPART 100 UNIT/ML IJ SOLN
0.0000 [IU] | Freq: Every day | INTRAMUSCULAR | Status: DC
  Administered 2024-02-28 – 2024-02-29 (×2): 3 [IU] via SUBCUTANEOUS
  Administered 2024-03-01: 4 [IU] via SUBCUTANEOUS
  Administered 2024-03-02: 3 [IU] via SUBCUTANEOUS

## 2024-02-28 MED ORDER — ALLOPURINOL 300 MG PO TABS
300.0000 mg | ORAL_TABLET | Freq: Every day | ORAL | Status: DC
Start: 2024-02-28 — End: 2024-03-03
  Administered 2024-02-28 – 2024-03-03 (×5): 300 mg via ORAL
  Filled 2024-02-28 (×5): qty 1

## 2024-02-28 MED ORDER — INSULIN ASPART 100 UNIT/ML IJ SOLN
0.0000 [IU] | Freq: Three times a day (TID) | INTRAMUSCULAR | Status: DC
  Administered 2024-02-28: 20 [IU] via SUBCUTANEOUS
  Administered 2024-02-29: 11 [IU] via SUBCUTANEOUS
  Administered 2024-02-29 (×2): 15 [IU] via SUBCUTANEOUS
  Administered 2024-03-01 (×2): 7 [IU] via SUBCUTANEOUS
  Administered 2024-03-01 – 2024-03-02 (×2): 20 [IU] via SUBCUTANEOUS
  Administered 2024-03-02: 11 [IU] via SUBCUTANEOUS
  Administered 2024-03-02: 15 [IU] via SUBCUTANEOUS
  Administered 2024-03-03: 20 [IU] via SUBCUTANEOUS
  Administered 2024-03-03: 15 [IU] via SUBCUTANEOUS

## 2024-02-28 MED ORDER — OXYCODONE HCL 5 MG PO TABS
5.0000 mg | ORAL_TABLET | Freq: Four times a day (QID) | ORAL | Status: DC | PRN
  Administered 2024-03-01 – 2024-03-03 (×4): 5 mg via ORAL
  Filled 2024-02-28 (×4): qty 1

## 2024-02-28 MED ORDER — HYDROCODONE-ACETAMINOPHEN 5-325 MG PO TABS: 1.0000 | ORAL_TABLET | Freq: Four times a day (QID) | ORAL | Status: DC | PRN

## 2024-02-28 MED ORDER — INSULIN GLARGINE 100 UNIT/ML ~~LOC~~ SOLN
15.0000 [IU] | SUBCUTANEOUS | Status: DC
  Administered 2024-02-28: 15 [IU] via SUBCUTANEOUS
  Filled 2024-02-28: qty 0.15

## 2024-02-28 MED ORDER — DILTIAZEM HCL ER COATED BEADS 180 MG PO CP24
180.0000 mg | ORAL_CAPSULE | Freq: Every day | ORAL | Status: DC
  Filled 2024-02-28: qty 1

## 2024-02-28 MED ORDER — ACETAMINOPHEN 325 MG PO TABS
650.0000 mg | ORAL_TABLET | Freq: Four times a day (QID) | ORAL | Status: DC | PRN
  Administered 2024-02-28 – 2024-03-03 (×5): 650 mg via ORAL
  Filled 2024-02-28 (×6): qty 2

## 2024-02-28 MED ORDER — INSULIN ASPART 100 UNIT/ML IJ SOLN
4.0000 [IU] | Freq: Three times a day (TID) | INTRAMUSCULAR | Status: DC
  Administered 2024-02-28 – 2024-03-02 (×10): 4 [IU] via SUBCUTANEOUS

## 2024-02-28 MED ORDER — DILTIAZEM HCL 30 MG PO TABS: 30.0000 mg | ORAL_TABLET | Freq: Four times a day (QID) | ORAL | Status: DC | PRN

## 2024-02-28 MED ORDER — TAMSULOSIN HCL 0.4 MG PO CAPS
0.4000 mg | ORAL_CAPSULE | Freq: Every day | ORAL | Status: DC
  Administered 2024-02-28 – 2024-03-03 (×5): 0.4 mg via ORAL
  Filled 2024-02-28 (×5): qty 1

## 2024-02-28 NOTE — Progress Notes (Signed)
 No significant change in status.  Recommend evaluation for metastatic disease with CT scan of neck chest abdomen and pelvis.  Depending on results of metastatic workup will consider treatment options.  Given skull base invasion and severe left hemispheric edema surgical risks and outcomes would be quite poor and I suspect this patient may ultimately be better served with palliative care.

## 2024-02-28 NOTE — Progress Notes (Signed)
 NEUROLOGY CONSULT FOLLOW UP NOTE   Date of service: February 28, 2024 Patient Name: Thomas Donovan MRN:  [id number] DOB:  10/02/1961  Interval Hx/subjective   He has markedly improved compared to yesterday. Vitals   Vitals:   02/28/24 1245 02/28/24 1300 02/28/24 1330 02/28/24 1419  BP:   137/80 (!) 99/47  Pulse: 85 (!) 43 88 91  Resp: (!) 21 (!) 31 (!) 23   Temp:    (!) 97.2 F (36.2 C)  TempSrc:    Oral  SpO2: 98% (!) 87% 96% 94%  Weight:    112 kg  Height:    6\' 2"  (1.88 m)     Body mass index is 31.7 kg/m.  Physical Exam   Constitutional: Appears well-developed and well-nourished.  Neurologic Examination    MS: Awake, alert, he does have some degree of aphasia, but this is much better than yesterday.  He is able to communicate most of his ideas, but does have some difficulty with word finding, as well as some paraphasic errors. CN: Face is symmetric Motor: He has no drift in either upper extremity, able to lift both legs well Sensory: Reports symmetric sensation  Medications  Current Facility-Administered Medications:    acetaminophen (TYLENOL) tablet 650 mg, 650 mg, Oral, Q6H PRN, Candelaria Stagers T, MD, 650 mg at 02/28/24 1433   allopurinol (ZYLOPRIM) tablet 300 mg, 300 mg, Oral, Daily, Alanda Slim, Taye T, MD, 300 mg at 02/28/24 1433   dexamethasone (DECADRON) injection 4 mg, 4 mg, Intravenous, Q6H, Osvaldo Shipper, MD, 4 mg at 02/28/24 1215   diltiazem (CARDIZEM) tablet 30 mg, 30 mg, Oral, Q6H PRN, Gonfa, Taye T, MD   HYDROcodone-acetaminophen (NORCO/VICODIN) 5-325 MG per tablet 1 tablet, 1 tablet, Oral, Q6H PRN, Gonfa, Taye T, MD   insulin aspart (novoLOG) injection 0-20 Units, 0-20 Units, Subcutaneous, Q4H, Osvaldo Shipper, MD, 11 Units at 02/28/24 1215   insulin glargine (LANTUS) injection 15 Units, 15 Units, Subcutaneous, Q24H, Gonfa, Taye T, MD, 15 Units at 02/28/24 1029   levETIRAcetam (KEPPRA) IVPB 500 mg/100 mL premix, 500 mg, Intravenous, Q12H, Osvaldo Shipper, MD,  Stopped at 02/28/24 1048   ondansetron (ZOFRAN) tablet 4 mg, 4 mg, Oral, Q6H PRN **OR** ondansetron (ZOFRAN) injection 4 mg, 4 mg, Intravenous, Q6H PRN, Osvaldo Shipper, MD   pantoprazole (PROTONIX) EC tablet 40 mg, 40 mg, Oral, Daily, Gonfa, Taye T, MD, 40 mg at 02/28/24 1433   tamsulosin (FLOMAX) capsule 0.4 mg, 0.4 mg, Oral, Daily, Alanda Slim, Taye T, MD, 0.4 mg at 02/28/24 1433  Labs and Diagnostic Imaging    Imaging(Personally reviewed): MRI brain-significant tumor concerning for metastatic disease  Assessment   Thomas Donovan is a 63 y.o. male with a history of prostate cancer who presents with base of skull base lesion with significant left hemispheric edema.  My suspicion is that he had a seizure yesterday, and I would favor continued antiepileptic therapy.  Neurosurgery is investigating possible treatment options, and I will defer to neurosurgical and oncology teams as far as treatment of the actual underlying tumor.  Recommendations  Continue Keppra 500 mg twice daily Continue Decadron per neurosurgery Neurology will continue to be available as needed, please call with further questions or concerns. ______________________________________________________________________   Stormy Card, MD [facility]

## 2024-02-28 NOTE — Plan of Care (Signed)

## 2024-02-28 NOTE — Progress Notes (Addendum)
 PROGRESS NOTE  Thomas Donovan:[id number] DOB: 12-02-1961   PCP: Pcp, No  Patient is from: [place]  DOA: 02/26/2024 LOS: 1  Chief complaints Chief Complaint  Patient presents with   Altered Mental Status     Brief Narrative / Interim history: 63 year old M with PMH of A-fib not on AC, IDDM-2, HTN, CAD, pelvic mass concerning for prostate cancer and questionable cirrhosis brought to [facility] ED [facility] due to possible seizure/syncope.  Evaluation raise concern for stroke versus brain lesion and he was transferred to [facility] for further evaluation.  Patient was obtunded on arrival to [facility].  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  Neurosurgery consulted.  Patient was started on Decadron and Keppra.  PSA elevated to 176.  Per report from a nurse at the jail, patient only takes 2 medications including tamsulosin and Cardizem.  Subjective: Seen and examined earlier this morning.  Low Engineer, manufacturing systems at bedside.  He reports chest pain but pointing at his lower abdomen.  He is awake but not quite alert.  He follows commands.  He is only oriented to self.  He thinks he is in [place].  Objective: Vitals:   02/28/24 1230 02/28/24 1245 02/28/24 1300 02/28/24 1330  BP:    137/80  Pulse: 86 85 (!) 43 88  Resp: (!) 22 (!) 21 (!) 31 (!) 23  Temp:      TempSrc:      SpO2: 96% 98% (!) 87% 96%    Examination:  GENERAL: No apparent distress.  Nontoxic. HEENT: MMM.  Vision and hearing grossly intact.  NECK: Supple.  No apparent JVD.  RESP:   No IWOB.  Fair aeration bilaterally. CVS:  RRR. Heart sounds normal.  ABD/GI/GU: BS+. Abd soft, NTND.  MSK/EXT:  Moves extremities. No apparent deformity. No edema.  SKIN: no apparent skin lesion or wound NEURO: Awake.  Oriented to self.  He thinks he is in [place].  No apparent focal neuro deficit but limited exam. PSYCH: Calm. Normal affect.   Procedures:  None  Microbiology summarized: COVID-19, influenza and RSV PCR nonreactive  Assessment and plan: Brain mass with vasogenic edema: CT and MRI brain shows multiple intracranial masses with vasogenic edema concerning for metastatic lesion.  Has history of pelvic mass concerning for prostate cancer.  Had sigmoidoscopy with negative pathology in 10/2023.  He was to follow-up with urology for prostate biopsy at that time.  His PSA is elevated to 170s. -Appreciate recommendation by neurosurgery-concern about poor outcome and recommending palliative -Will check CT chest, abdomen and pelvis with contrast for primary source -Palliative medicine consult -Continue Decadron and Keppra   Acute encephalopathy: Likely due to brain mass with vasogenic edema.  EEG did not show seizure or epileptiform discharge.  Currently awake but only oriented to self.  Follows some commands.  No focal neurodeficit. -Decadron and Keppra as above   Pelvic mass concerning for prostate cancer: Previously noted on CT and he had sigmoidoscopy with negative pathology in 10/2023.  Plan was to follow-up with urology that did not happen.  PSA elevated to 170s. -  CT chest, abdomen and pelvis as above -Continue home Flomax   History of PAF: Off anticoagulation. -Stop IV Cardizem 2 hours after p.o. Cardizem. -Hold anticoagulation   Poorly controlled DM-2 with hyperglycemia: A1c 7.5% on 12/3. Recent Labs  Lab 02/27/24 2033 02/28/24 0009 02/28/24 0433 02/28/24 0748 02/28/24 1153  GLUCAP 249* 278* 296* 318* 258*  -Recheck hemoglobin A1c -Add Semglee 15 units  daily -Continue SSI-resistant scale every 4 hours.  History of liver cirrhosis: CT raises concern for this.  No signs of ascites.  LFT within normal -Follow CT abdomen and pelvis  Essential hypertension: Normotensive -Continue Cardizem infusion while NPO Monitor blood pressures closely.   Normocytic anemia: Stable -Continue monitoring  Dysphagia: Likely due to #1 -Dysphagia 3 diet per SLP  There is no height or weight on file to calculate BMI.           DVT prophylaxis:  SCDs Start: 02/27/24 1224  Code Status: Full code Family Communication: Public relations account executive at bedside Level of care: Progressive Status is: Inpatient Remains inpatient appropriate because: Intracranial mass, encephalopathy   Final disposition: To be determined Consultants:  Neurosurgery Palliative medicine  55 minutes with more than 50% spent in reviewing records, counseling patient/family and coordinating care.   Sch Meds:  Scheduled Meds:  allopurinol  300 mg Oral Daily   dexamethasone (DECADRON) injection  4 mg Intravenous Q6H   diltiazem  180 mg Oral Daily   insulin aspart  0-20 Units Subcutaneous Q4H   insulin glargine  15 Units Subcutaneous Q24H   pantoprazole  40 mg Oral Daily   tamsulosin  0.4 mg Oral Daily   Continuous Infusions:  diltiazem (CARDIZEM) infusion     levETIRAcetam Stopped (02/28/24 1048)   PRN Meds:.acetaminophen, HYDROcodone-acetaminophen, ondansetron **OR** ondansetron (ZOFRAN) IV  Antimicrobials: Anti-infectives (From admission, onward)    None        I have personally reviewed the following labs and images: CBC: Recent Labs  Lab 02/26/24 2013 02/28/24 0207  WBC 12.0* 9.8  HGB 11.2* 10.2*  HCT 36.2* 32.3*  MCV 81.0 79.2*  PLT 437* 435*   BMP &GFR Recent Labs  Lab 02/26/24 2013 02/28/24 0207  NA 138 132*  K 4.1 3.8  CL 102 102  CO2 24 20*  GLUCOSE 196* 341*  BUN 13 26*  CREATININE 1.40* 1.64*  CALCIUM 9.5 8.7*   CrCl cannot be  calculated (Unknown ideal weight.). Liver & Pancreas: Recent Labs  Lab 02/28/24 0207  AST 14*  ALT 16  ALKPHOS 105  BILITOT 0.6  PROT 6.0*  ALBUMIN 2.5*   No results for input(s): "LIPASE", "AMYLASE" in the last 168 hours. Recent Labs  Lab 02/26/24 2013  AMMONIA 13   Diabetic: No results for input(s): "HGBA1C" in the last 72 hours. Recent Labs  Lab 02/27/24 2033 02/28/24 0009 02/28/24 0433 02/28/24 0748 02/28/24 1153  GLUCAP 249* 278* 296* 318* 258*   Cardiac Enzymes: No results for input(s): "CKTOTAL", "CKMB", "CKMBINDEX", "TROPONINI" in the last 168 hours. No results for input(s): "PROBNP" in the last 8760 hours. Coagulation Profile: No results for input(s): "INR", "PROTIME" in the last 168 hours. Thyroid Function Tests: No results for input(s): "TSH", "T4TOTAL", "FREET4", "T3FREE", "THYROIDAB" in the last 72 hours. Lipid Profile: No results for input(s): "CHOL", "HDL", "LDLCALC", "TRIG", "CHOLHDL", "LDLDIRECT" in the last 72 hours. Anemia Panel: No results for input(s): "VITAMINB12", "FOLATE", "FERRITIN", "TIBC", "IRON", "RETICCTPCT" in the last 72 hours. Urine analysis:    Component Value Date/Time   COLORURINE YELLOW 02/26/2024  2023   APPEARANCEUR HAZY (A) 02/26/2024 2023   LABSPEC 1.019 02/26/2024 2023   PHURINE 5.0 02/26/2024 2023   GLUCOSEU 50 (A) 02/26/2024 2023   HGBUR NEGATIVE 02/26/2024 2023   BILIRUBINUR NEGATIVE 02/26/2024 2023   KETONESUR NEGATIVE 02/26/2024 2023   PROTEINUR 100 (A) 02/26/2024 2023   UROBILINOGEN 0.2 01/01/2015 0912   NITRITE NEGATIVE 02/26/2024 2023   LEUKOCYTESUR NEGATIVE 02/26/2024 2023   Sepsis Labs: Invalid input(s): "PROCALCITONIN", "LACTICIDVEN"  Microbiology: Recent Results (from the past 240 hours)  Resp panel by RT-PCR (RSV, Flu A&B, Covid) Anterior Nasal Swab     Status: None   Collection Time: 02/26/24  7:44 PM   Specimen: Anterior Nasal Swab  Result Value Ref Range Status   SARS Coronavirus 2 by RT PCR  NEGATIVE NEGATIVE Final    Comment: (NOTE) SARS-CoV-2 target nucleic acids are NOT DETECTED.  The SARS-CoV-2 RNA is generally detectable in upper respiratory specimens during the acute phase of infection. The lowest concentration of SARS-CoV-2 viral copies this assay can detect is 138 copies/mL. A negative result does not preclude SARS-Cov-2 infection and should not be used as the sole basis for treatment or other patient management decisions. A negative result may occur with  improper specimen collection/handling, submission of specimen other than nasopharyngeal swab, presence of viral mutation(s) within the areas targeted by this assay, and inadequate number of viral copies(<138 copies/mL). A negative result must be combined with clinical observations, patient history, and epidemiological information. The expected result is Negative.  Fact Sheet for Patients:  BloggerCourse.com  Fact Sheet for Healthcare Providers:  SeriousBroker.it  This test is no t yet approved or cleared by the [place] FDA and  has been authorized for detection and/or diagnosis of SARS-CoV-2 by FDA under an Emergency Use Authorization (EUA). This EUA will remain  in effect (meaning this test can be used) for the duration of the COVID-19 declaration under Section 564(b)(1) of the Act, 21 U.S.C.section 360bbb-3(b)(1), unless the authorization is terminated  or revoked sooner.       Influenza A by PCR NEGATIVE NEGATIVE Final   Influenza B by PCR NEGATIVE NEGATIVE Final    Comment: (NOTE) The Xpert Xpress SARS-CoV-2/FLU/RSV plus assay is intended as an aid in the diagnosis of influenza from Nasopharyngeal swab specimens and should not be used as a sole basis for treatment. Nasal washings and aspirates are unacceptable for Xpert Xpress SARS-CoV-2/FLU/RSV testing.  Fact Sheet for Patients: BloggerCourse.com  Fact Sheet for  Healthcare Providers: SeriousBroker.it  This test is not yet approved or cleared by the [place] FDA and has been authorized for detection and/or diagnosis of SARS-CoV-2 by FDA under an Emergency Use Authorization (EUA). This EUA will remain in effect (meaning this test can be used) for the duration of the COVID-19 declaration under Section 564(b)(1) of the Act, 21 U.S.C. section 360bbb-3(b)(1), unless the authorization is terminated or revoked.     Resp Syncytial Virus by PCR NEGATIVE NEGATIVE Final    Comment: (NOTE) Fact Sheet for Patients: BloggerCourse.com  Fact Sheet for Healthcare Providers: SeriousBroker.it  This test is not yet approved or cleared by the [place] FDA and has been authorized for detection and/or diagnosis of SARS-CoV-2 by FDA under an Emergency Use Authorization (EUA). This EUA will remain in effect (meaning this test can be used) for the duration of the COVID-19 declaration under Section 564(b)(1) of the Act, 21 U.S.C. section 360bbb-3(b)(1), unless the authorization is terminated or revoked.  Performed at [facility], [phone]  [address], [place], [place] [address]     Radiology Studies: No results found.    Seferina Brokaw T. [facility]  If 7PM-7AM, please contact night-coverage [email or web address] 02/28/2024, 1:55 PM

## 2024-02-28 NOTE — Progress Notes (Signed)
 Providing Compassionate, Quality Care - Together   Subjective: Patient reports severe headache.  Objective: Vital signs in last 24 hours: Temp:  [97.8 F (36.6 C)-98 F (36.7 C)] 97.8 F (36.6 C) (03/05 0520) Pulse Rate:  [33-113] 88 (03/05 1330) Resp:  [14-31] 23 (03/05 1330) BP: (109-140)/(53-87) 137/80 (03/05 1330) SpO2:  [87 %-100 %] 96 % (03/05 1330)  Intake/Output from previous day: No intake/output data recorded. Intake/Output this shift: Total I/O In: 193.7 [IV Piggyback:193.7] Out: -   Responds to voice Oriented to self and situation PERRLA Speech clearer today MAE, S/S intact   Lab Results: Recent Labs    02/26/24 2013 02/28/24 0207  WBC 12.0* 9.8  HGB 11.2* 10.2*  HCT 36.2* 32.3*  PLT 437* 435*   BMET Recent Labs    02/26/24 2013 02/28/24 0207  NA 138 132*  K 4.1 3.8  CL 102 102  CO2 24 20*  GLUCOSE 196* 341*  BUN 13 26*  CREATININE 1.40* 1.64*  CALCIUM 9.5 8.7*    Studies/Results: EEG adult Result Date: 02/27/2024 Charlsie Quest, MD     02/27/2024 10:30 AM Patient Name: Thomas Donovan MRN: [id number] Epilepsy Attending: Charlsie Quest Referring Physician/Provider: Caryl Pina, MD Date: 02/27/2024 Duration: 27.35 mins Patient history: 63yo M with ams. EEG to evaluate for seizure Level of alertness: Asleep AEDs during EEG study: LEV, Dexamethasone, Ativan Technical aspects: This EEG study was done with scalp electrodes positioned according to the 10-20 International system of electrode placement. Electrical activity was reviewed with band pass filter of 1-70Hz , sensitivity of 7 uV/mm, display speed of 64mm/sec with a 60Hz  notched filter applied as appropriate. EEG data were recorded continuously and digitally stored.  Video monitoring was available and reviewed as appropriate. Description: Sleep was characterized by sleep spindles (12 to 14 Hz), maximal frontocentral region. Hyperventilation and photic stimulation were not performed.    IMPRESSION: This study during sleep only is within normal limits. No seizures or epileptiform discharges were seen throughout the recording.  If suspicion for ictal- interictal activity remains a concern, a prolonged study can be considered. Charlsie Quest   MR BRAIN W WO CONTRAST Result Date: 02/27/2024 CLINICAL DATA:  63 year old male with altered mental status. Unwitnessed fall. Left MCA infarct versus tumor on head CT/CTA. EXAM: MRI HEAD WITHOUT AND WITH CONTRAST TECHNIQUE: Multiplanar, multiecho pulse sequences of the brain and surrounding structures were obtained without and with intravenous contrast. CONTRAST:  10mL GADAVIST GADOBUTROL 1 MMOL/ML IV SOLN COMPARISON:  Head CT yesterday, CTA head and neck 0047 hours today. FINDINGS: Brain: Large round and lobulated, intensely enhancing left middle cranial fossa mass. The enhancing component encompasses 42 x 45 x 41 mm and is inseparable from the left cavernous sinus (series 8, image 21). This was largely isodense by CT. Extensive tumoral edema in the left hemisphere with mostly a vasogenic pattern including tracking into all deep white matter capsules, the deep gray nuclei. Along the posterosuperior margin of the dominant enhancing component is a confluent area of hemosiderin and wagon-wheel-spoke type appearance on precontrast T2, which is partially enhancing postcontrast (series 4, image 26). Initial coronal T2 weighted images at 0317 hours today were motion degraded but repeat coronal T2 at 0853 hours, series 6, images 15 and 16 suggest a CSF gap between the enhancing component of the mass and adjacent left temporal lobe. And furthermore, there is strong evidence of tumor invasion through the bone of the left middle cranial fossa, into the left infratemporal fossa with  solid enhancing tumor there (series 3, image 19 versus series 9, image 17) and also into the posterior left orbit (series 4, image 22). Sclerotic and mildly hyperostotic bone in these areas  by CT. And there is abnormal signal in the left pterygoid and temporalis muscles. Therefore, all told the bulky left middle cranial fossa tumor is at least 62 x 56 by 69 mm (AP by transverse by CC). Furthermore there is an expansile tumor of the right mandible ramus and condyle (series 3 image 4 and series 9, image 4 estimated at 34 mm diameter and abnormally enhancing. Right mandible condyle permeated and partially destroyed by CT. Associated intracranial mass effect with rightward midline shift of 10 mm and subtotal effacement of the left lateral ventricle. No ventriculomegaly. Basilar cisterns remain patent. No restricted diffusion suggestive of infarction. No other cerebral edema identified. No other cerebral blood products identified. Vascular: Major intracranial vascular flow voids are preserved. There is mass effect on the left ICA terminus, left MCA branches. Skull and upper cervical spine: Background bone marrow signal is within normal limits. Grossly negative visible cervical spine. Sinuses/Orbits: Infiltrative tumor in the posterior left orbit, along the left lateral wall of that orbit. Underlying abnormal bone. No significant exophthalmos. No other abnormal intraorbital mass or enhancement is evident. But there is also tumor infiltration of the left sphenoid and maxillary sinuses, also confirmed by CT. Other: Mastoids remain clear. Grossly negative visible internal auditory structures. IMPRESSION: 1. Motion degraded exam. 2. 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 Estimated tumor long-axis nearly 7 cm. Confluent enhancing intracranial component is 4-5 cm. AND a second similar bony destructive and expansile enhancing Tumor of the Right Mandible Condyle is 3-4 cm. 3. Multiplicity and constellation of imaging findings  favors Metastases over other considerations such as invasive Meningioma, high-grade glioma, or Lymphoma. 4. Extensive probably vasogenic tumoral edema in the left cerebral hemisphere with intracranial mass effect including rightward midline shift of 10 mm. Basilar cisterns remain patent. No evidence of acute cerebral infarct. Electronically Signed   By: Odessa Fleming M.D.   On: 02/27/2024 09:45   DG Femur Portable 1 View Right Result Date: 02/27/2024 CLINICAL DATA:  MRI clearance EXAM: RIGHT FEMUR PORTABLE 1 VIEW COMPARISON:  None Available. FINDINGS: There is no evidence of fracture or other focal bone lesions. Soft tissues are unremarkable. No radiopaque foreign body is noted. IMPRESSION: No acute abnormality noted. Electronically Signed   By: Alcide Clever M.D.   On: 02/27/2024 03:41   DG Femur Portable 1 View Left Result Date: 02/27/2024 CLINICAL DATA:  MRI clearance EXAM: LEFT FEMUR PORTABLE 1 VIEW COMPARISON:  None Available. FINDINGS: There is no evidence of fracture or other focal bone lesions. Soft tissues are unremarkable. No foreign body seen. IMPRESSION: No acute abnormality noted. Electronically Signed   By: Alcide Clever M.D.   On: 02/27/2024 03:41   DG Humerus Right Result Date: 02/27/2024 CLINICAL DATA:  MRI screening EXAM: RIGHT HUMERUS - 2+ VIEW COMPARISON:  None Available. FINDINGS: No acute fracture is noted. A needle is noted overlying the right arm likely extrinsic to the patient. No other focal abnormality is noted. IMPRESSION: Needle is noted over the mid upper arm likely extrinsic to the patient. Correlate with the physical exam. Electronically Signed   By: Alcide Clever M.D.   On: 02/27/2024 03:41  DG Humerus Left Result Date: 02/27/2024 CLINICAL DATA:  Screening for MRI EXAM: LEFT HUMERUS - 2+ VIEW COMPARISON:  None Available. FINDINGS: No acute fractures noted. No radiopaque foreign body is noted. Degenerative changes of the acromioclavicular joint are seen. IMPRESSION: No foreign body  noted. Electronically Signed   By: Alcide Clever M.D.   On: 02/27/2024 03:39   CT ANGIO HEAD NECK W WO CM Result Date: 02/27/2024 CLINICAL DATA:  Stroke, follow up EXAM: CT ANGIOGRAPHY HEAD AND NECK WITH AND WITHOUT CONTRAST TECHNIQUE: Multidetector CT imaging of the head and neck was performed using the standard protocol during bolus administration of intravenous contrast. Multiplanar CT image reconstructions and MIPs were obtained to evaluate the vascular anatomy. Carotid stenosis measurements (when applicable) are obtained utilizing NASCET criteria, using the distal internal carotid diameter as the denominator. RADIATION DOSE REDUCTION: This exam was performed according to the departmental dose-optimization program which includes automated exposure control, adjustment of the mA and/or kV according to patient size and/or use of iterative reconstruction technique. CONTRAST:  75mL OMNIPAQUE IOHEXOL 350 MG/ML SOLN COMPARISON:  None Available. FINDINGS: CTA NECK FINDINGS Aortic arch: Great vessel origins are patent without significant stenosis. Right carotid system: No evidence of dissection, stenosis (50% or greater), or occlusion. Left carotid system: No evidence of dissection, stenosis (50% or greater), or occlusion. Vertebral arteries: Left dominant. No evidence of dissection, stenosis (50% or greater), or occlusion. Skeleton: No acute abnormality on limited assessment. Other neck: No acute abnormality on limited assessment. Upper chest: Visualized lung apices are clear. 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 Review of the MIP images confirms the above findings CTA HEAD FINDINGS Anterior circulation: No significant stenosis, proximal occlusion, aneurysm, or vascular malformation. Posterior circulation: No significant stenosis, proximal occlusion, aneurysm, or vascular  malformation. Venous sinuses: As permitted by contrast timing, patent. Review of the MIP images confirms the above findings IMPRESSION: 1. Although incompletely assessed on this study, suspected enhancing mass in the anterior left temporal lobe which likely is the cause of the patient's extensive left cerebral edema. Recommend MRI of the head with contrast to confirm and further evaluate. 2. No large vessel occlusion or proximal hemodynamically significant stenosis. Findings discussed with Dr. Blinda Leatherwood via telephone at 2:44 a.m. Electronically Signed   By: Feliberto Harts M.D.   On: 02/27/2024 02:45   DG Chest Port 1 View Result Date: 02/26/2024 CLINICAL DATA:  Altered mental status. EXAM: PORTABLE CHEST 1 VIEW COMPARISON:  January 24, 2024 FINDINGS: The heart size and mediastinal contours are within normal limits. Both lungs are clear. Chronic and degenerative changes are seen involving the right shoulder. The visualized skeletal structures are otherwise unremarkable. IMPRESSION: No active disease. Electronically Signed   By: Aram Candela M.D.   On: 02/26/2024 23:05   CT Cervical Spine Wo Contrast Result Date: 02/26/2024 CLINICAL DATA:  Unwitnessed fall. EXAM: CT CERVICAL SPINE WITHOUT CONTRAST TECHNIQUE: Multidetector CT imaging of the cervical spine was performed without intravenous contrast. Multiplanar CT image reconstructions were also generated. RADIATION DOSE REDUCTION: This exam was performed according to the departmental dose-optimization program which includes automated exposure control, adjustment of the mA and/or kV according to patient size and/or use of iterative reconstruction technique. COMPARISON:  None Available. FINDINGS: Alignment: Normal. Skull base and vertebrae: No acute fracture. No primary bone lesion or focal pathologic process. Soft tissues and spinal canal: No prevertebral fluid  or swelling. No visible canal hematoma. Disc levels: Moderate to marked severity endplate sclerosis,  anterior osteophyte formation and posterior bony spurring are seen at the level of C5-C6. Mild multilevel endplate sclerosis is seen throughout the remainder of the cervical spine. Mild intervertebral disc space narrowing is seen at the levels of C5-C6 and C6-C7. Bilateral moderate to marked severity multilevel facet joint hypertrophy is noted, right greater than left. Upper chest: Negative. Other: A 9 mm diameter thyroid nodule is seen within the left lobe of the thyroid gland. IMPRESSION: 1. No acute fracture or subluxation in the cervical spine. 2. Moderate to marked severity degenerative changes, most prominent at the level of C5-C6. 3. 9 mm diameter thyroid nodule within the left lobe of the thyroid gland. No follow-up imaging is recommended. This follows ACR consensus guidelines: Managing Incidental Thyroid Nodules Detected on Imaging: White Paper of the ACR Incidental Thyroid Findings Committee. J Am Coll Radiol 2015; 12:143-150. Electronically Signed   By: Aram Candela M.D.   On: 02/26/2024 23:04   CT Head Wo Contrast Result Date: 02/26/2024 CLINICAL DATA:  Unwitnessed fall. EXAM: CT HEAD WITHOUT CONTRAST TECHNIQUE: Contiguous axial images were obtained from the base of the skull through the vertex without intravenous contrast. RADIATION DOSE REDUCTION: This exam was performed according to the departmental dose-optimization program which includes automated exposure control, adjustment of the mA and/or kV according to patient size and/or use of iterative reconstruction technique. COMPARISON:  None Available. FINDINGS: Brain: A large area of cortical and white matter low attenuation is seen throughout the entire left parietal lobe. There is associated mass effect on the adjacent sulci and left lateral ventricle with approximate 7 mm left-to-right midline shift. No evidence of hemorrhage, hydrocephalus or extra-axial collection or mass lesion. Vascular: No hyperdense vessel or unexpected calcification.  Skull: Normal. Negative for fracture or focal lesion. Sinuses/Orbits: There is mild left maxillary sinus, left-sided sphenoid sinus and left ethmoid sinus mucosal thickening. Other: None. IMPRESSION: 1. 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 MRI correlation is recommended. 2. Associated mass effect on the adjacent sulci and left lateral ventricle with approximate 7 mm left-to-right midline shift. 3. Mild left maxillary sinus, left-sided sphenoid sinus and left ethmoid sinus disease. Electronically Signed   By: Aram Candela M.D.   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 Recommend CT neck, chest, abdomen, and pelvis for further work up.   LOS: 1 day    I am in communication with my attending and they agree with the plan for this patient.   Val Eagle, DNP, AGNP-C Nurse Practitioner  [facility] [address], [address], [place], [place] [address] P: [phone]    F: [phone]  02/28/2024, 1:45 PM

## 2024-02-29 DIAGNOSIS — I482 Chronic atrial fibrillation, unspecified: Secondary | ICD-10-CM

## 2024-02-29 DIAGNOSIS — G9340 Encephalopathy, unspecified: Secondary | ICD-10-CM

## 2024-02-29 DIAGNOSIS — C787 Secondary malignant neoplasm of liver and intrahepatic bile duct: Secondary | ICD-10-CM | POA: Diagnosis not present

## 2024-02-29 DIAGNOSIS — C61 Malignant neoplasm of prostate: Secondary | ICD-10-CM

## 2024-02-29 DIAGNOSIS — C7951 Secondary malignant neoplasm of bone: Secondary | ICD-10-CM | POA: Diagnosis not present

## 2024-02-29 DIAGNOSIS — D496 Neoplasm of unspecified behavior of brain: Secondary | ICD-10-CM | POA: Diagnosis not present

## 2024-02-29 LAB — RPR: RPR Ser Ql: NONREACTIVE

## 2024-02-29 LAB — GLUCOSE, CAPILLARY
Glucose-Capillary: 268 mg/dL — ABNORMAL HIGH (ref 70–99)
Glucose-Capillary: 448 mg/dL — ABNORMAL HIGH (ref 70–99)
Glucose-Capillary: 319 mg/dL — ABNORMAL HIGH (ref 70–99)
Glucose-Capillary: 388 mg/dL — ABNORMAL HIGH (ref 70–99)
Glucose-Capillary: 313 mg/dL — ABNORMAL HIGH (ref 70–99)
Glucose-Capillary: 280 mg/dL — ABNORMAL HIGH (ref 70–99)

## 2024-02-29 MED ORDER — INSULIN GLARGINE 100 UNIT/ML ~~LOC~~ SOLN
5.0000 [IU] | Freq: Once | SUBCUTANEOUS | Status: AC
Start: 2024-02-29 — End: 2024-02-29
  Administered 2024-02-29: 5 [IU] via SUBCUTANEOUS
  Filled 2024-02-29: qty 0.05

## 2024-02-29 MED ORDER — DILTIAZEM HCL 30 MG PO TABS
30.0000 mg | ORAL_TABLET | Freq: Four times a day (QID) | ORAL | Status: DC
  Administered 2024-02-29 – 2024-03-02 (×8): 30 mg via ORAL
  Filled 2024-02-29 (×8): qty 1

## 2024-02-29 MED ORDER — INSULIN GLARGINE 100 UNIT/ML ~~LOC~~ SOLN
20.0000 [IU] | Freq: Two times a day (BID) | SUBCUTANEOUS | Status: DC
  Administered 2024-02-29 – 2024-03-01 (×3): 20 [IU] via SUBCUTANEOUS
  Filled 2024-02-29 (×5): qty 0.2

## 2024-02-29 MED ORDER — HALOPERIDOL LACTATE 5 MG/ML IJ SOLN: 5.0000 mg | INTRAMUSCULAR | Status: DC | PRN

## 2024-02-29 MED ORDER — INSULIN GLARGINE 100 UNIT/ML ~~LOC~~ SOLN
25.0000 [IU] | Freq: Two times a day (BID) | SUBCUTANEOUS | Status: DC
  Filled 2024-02-29: qty 0.25

## 2024-02-29 MED ORDER — INSULIN GLARGINE 100 UNIT/ML ~~LOC~~ SOLN
10.0000 [IU] | Freq: Once | SUBCUTANEOUS | Status: DC
  Filled 2024-02-29: qty 0.1

## 2024-02-29 MED ORDER — HALOPERIDOL LACTATE 5 MG/ML IJ SOLN
5.0000 mg | Freq: Four times a day (QID) | INTRAMUSCULAR | Status: DC | PRN
  Administered 2024-02-29: 5 mg via INTRAVENOUS
  Filled 2024-02-29: qty 1

## 2024-02-29 MED ORDER — LORAZEPAM 2 MG/ML IJ SOLN
1.0000 mg | INTRAMUSCULAR | Status: DC | PRN
  Administered 2024-02-29 – 2024-03-01 (×2): 1 mg via INTRAVENOUS
  Filled 2024-02-29 (×2): qty 1

## 2024-02-29 MED ORDER — INSULIN ASPART 100 UNIT/ML IJ SOLN: 25.0000 [IU] | Freq: Once | INTRAMUSCULAR | Status: DC

## 2024-02-29 NOTE — Inpatient Diabetes Management (Addendum)
 Inpatient Diabetes Program Recommendations  AACE/ADA: New Consensus Statement on Inpatient Glycemic Control (2015)  Target Ranges:  Prepandial:   less than 140 mg/dL      Peak postprandial:   less than 180 mg/dL (1-2 hours)      Critically ill patients:  140 - 180 mg/dL   Lab Results  Component Value Date   GLUCAP 268 (H) 02/29/2024   HGBA1C 7.5 (H) 11/28/2023    Review of Glycemic Control  Latest Reference Range & Units 02/28/24 07:48 02/28/24 11:53 02/28/24 17:16 02/28/24 20:49 02/29/24 07:55  Glucose-Capillary 70 - 99 mg/dL 161 (H) 096 (H) 045 (H) 263 (H) 268 (H)   Diabetes history: DM 2 Outpatient Diabetes medications:  Glipizide and metformin in the past Current orders for Inpatient glycemic control:  Lantus 15 units bid Novolog 0-20 units tid + hs Novolog 4 units tid meal coverage  Decadron 4 mg Q6 hours  Inpatient Diabetes Program Recommendations:    -   Increase Novolog meal coverage to 8 units tid if eating >50% of meals -  Increase Lantus to 18 units bid  Spoke with pt at bedside regarding the use of steroids and need for insulin at time of discharge. Pt has been on insulin in the past.  Thanks,  Christena Deem RN, MSN, BC-ADM Inpatient Diabetes Coordinator Team Pager [phone] (8a-5p)

## 2024-02-29 NOTE — Consult Note (Signed)
 [facility] Hematology and oncology consult note   Patient Care Team: Pcp, No as PCP - General Branch, Dorothe Pea, MD as PCP - Cardiology (Cardiology)   ASSESSMENT & PLAN:  63 y.o.male with past medical history of hepatic cirrhosis, A-fib not on AC, IDDM-2, HTN, obesity, CKD 3, polysubstance abuse, CAD consulted for metastatic prostate cancer.  CT scan showed large soft tissue mass between rectum and prostate, enlarged within 3 months.  There is also a lesion in the liver.  MRI of the brain showed large enhancing left middle cranial fossa mass with extensive edema in the left hemisphere causing midline shift and invading into the left orbit, infratemporal fossa and paranasal sinus.  Another bony destructive expiring has been tumor of the right mandible condyle.  Patient also has liver cirrhosis.  PSA was 176.  He was 18 4 months ago. Spoke to neurosurgery prognosis is poor given the involvement and will have significant deficit. Discussed with patient of stage IV prostate cancer with the aggressive presentation and rapid progression to involve intracranial and liver involvement, this is not curable. Recommend comfort care with hospice at home and spend quality time with wife and love ones.  The total time spent in the appointment was 55 minutes encounter with patients including review of chart and various tests results, discussions about plan of care and coordination of care plan   Melven Sartorius, MD 02/29/2024 3:16 PM   CHIEF COMPLAINTS/PURPOSE OF ADMISSION Brain tumor  HISTORY OF PRESENTING ILLNESS:  Thomas Donovan 63 y.o. male consulted for brain tumor with prostate cancer. Patient is admitted for change in mental status. Report of episode of an unwitnessed fall. He was initially brought to the emergency department at [facility].  Evaluation there raised concern for stroke versus brain lesion. Patient was urgently transferred to [facility] for further  evaluation. MRI of brain showed large enhancing left middle cranial fossa mass with extensive edema in the left hemisphere causing midline shift and invading into the left orbit, infratemporal fossa and paranasal sinus. PSA was 174. CT showed liver metastasis.  Summary of oncologic history as follows: Oncology History   No history exists.    MEDICAL HISTORY:  Past Medical History:  Diagnosis Date   Aortic aneurysm (HCC) 2019   Chronic back pain    Cold    recent rx   Coronary artery disease    Degeneration of lumbar intervertebral disc    Diabetes mellitus    GERD (gastroesophageal reflux disease)    Gout    History of kidney stones    Hypertension    Pneumonia     SURGICAL HISTORY: Past Surgical History:  Procedure Laterality Date   BACK SURGERY     BIOPSY  11/02/2023   Procedure: BIOPSY;  Surgeon: Franky Macho, MD;  Location: [facility];  Service: Endoscopy;;   FLEXIBLE SIGMOIDOSCOPY N/A 11/02/2023   Procedure: FLEXIBLE SIGMOIDOSCOPY;  Surgeon: Franky Macho, MD;  Location: [facility];  Service: Endoscopy;  Laterality: N/A;   IRRIGATION AND DEBRIDEMENT ABSCESS N/A 07/15/2019   Procedure: IRRIGATION AND DRAINAGE OF PERINEAL ABSCESS;  Surgeon: Franky Macho, MD;  Location: [facility] ORS;  Service: General;  Laterality: N/A;   MAXIMUM ACCESS (MAS)POSTERIOR LUMBAR INTERBODY FUSION (PLIF) 1 LEVEL  11/20/2013   Procedure: FOR MAXIMUM ACCESS (MAS) POSTERIOR LUMBAR INTERBODY FUSION LUMBAR FIVE-SACRAL ONE;  Surgeon: Tia Alert, MD;  Location: [facility] NEURO ORS;  Service: Neurosurgery;;  FOR MAXIMUM ACCESS (MAS) POSTERIOR  LUMBAR INTERBODY FUSION LUMBAR FIVE-SACRAL ONE   TONSILLECTOMY      SOCIAL HISTORY: Social History   Socioeconomic History   Marital status: Married    Spouse name: Not on file   Number of children: Not on file   Years of education: Not on file   Highest education level: Not on file  Occupational History   Not on file  Tobacco Use   Smoking status:  Former    Current packs/day: 0.50    Average packs/day: 0.5 packs/day for 10.0 years (5.0 ttl pk-yrs)    Types: Cigarettes   Smokeless tobacco: Never  Vaping Use   Vaping status: Never Used  Substance and Sexual Activity   Alcohol use: Yes    Alcohol/week: 14.0 standard drinks of alcohol    Types: 14 Cans of beer per week    Comment: 2 beers daily   Drug use: No   Sexual activity: Yes  Other Topics Concern   Not on file  Social History Narrative   Not on file   Social Drivers of Health   Financial Resource Strain: Medium Risk (01/05/2023)   Received from [facility], [facility]   Overall Financial Resource Strain (CARDIA)    Difficulty of Paying Living Expenses: Somewhat hard  Food Insecurity: No Food Insecurity (02/28/2024)   Hunger Vital Sign    Worried About Running Out of Food in the Last Year: Never true    Ran Out of Food in the Last Year: Never true  Transportation Needs: No Transportation Needs (02/28/2024)   PRAPARE - Administrator, Civil Service (Medical): No    Lack of Transportation (Non-Medical): No  Physical Activity: Inactive (10/04/2023)   Received from [facility]   Exercise Vital Sign    Days of Exercise per Week: 0 days    Minutes of Exercise per Session: 0 min  Stress: Stress Concern Present (10/04/2023)   Received from [facility] - Occupational Stress Questionnaire    Feeling of Stress : Very much  Social Connections: Moderately Integrated (10/04/2023)   Received from [facility]   Social Connection and Isolation Panel [NHANES]    Frequency of Communication with Friends and Family: Once a week    Frequency of Social Gatherings with Friends and Family: Once a week    Attends Religious Services: 1 to 4 times per year    Active Member of [employer] or Organizations: No    Attends Engineer, structural: 1 to 4 times per year    Marital Status: Married  Catering manager Violence:  Not At Risk (02/28/2024)   Humiliation, Afraid, Rape, and Kick questionnaire    Fear of Current or Ex-Partner: No    Emotionally Abused: No    Physically Abused: No    Sexually Abused: No    FAMILY HISTORY: Family History  Problem Relation Age of Onset   Colon cancer Neg Hx     ALLERGIES:  is allergic to celebrex [celecoxib], guaifenesin, ibuprofen, tramadol, acetaminophen, aspirin, ketorolac, and toradol [ketorolac tromethamine].  MEDICATIONS:  Current Facility-Administered Medications  Medication Dose Route Frequency Provider Last Rate Last Admin   acetaminophen (TYLENOL) tablet 650 mg  650 mg Oral Q6H PRN Candelaria Stagers T, MD   650 mg at 02/28/24 1433   allopurinol (ZYLOPRIM) tablet 300 mg  300 mg Oral Daily Candelaria Stagers T, MD   300 mg at 02/29/24 0931   dexamethasone (DECADRON) injection 4  mg  4 mg Intravenous Q6H Osvaldo Shipper, MD   4 mg at 02/29/24 1130   diltiazem (CARDIZEM) tablet 30 mg  30 mg Oral Q6H Elgergawy, Leana Roe, MD   30 mg at 02/29/24 1130   haloperidol lactate (HALDOL) injection 2 mg  2 mg Intravenous Q6H PRN Candelaria Stagers T, MD   2 mg at 02/28/24 1531   HYDROmorphone (DILAUDID) injection 0.5 mg  0.5 mg Intravenous Q4H PRN Gonfa, Taye T, MD       insulin aspart (novoLOG) injection 0-20 Units  0-20 Units Subcutaneous TID WC Gonfa, Taye T, MD   15 Units at 02/29/24 1410   insulin aspart (novoLOG) injection 0-5 Units  0-5 Units Subcutaneous QHS Candelaria Stagers T, MD   3 Units at 02/28/24 2141   insulin aspart (novoLOG) injection 4 Units  4 Units Subcutaneous TID WC Candelaria Stagers T, MD   4 Units at 02/29/24 1411   insulin glargine (LANTUS) injection 20 Units  20 Units Subcutaneous BID Elgergawy, Leana Roe, MD       levETIRAcetam (KEPPRA) IVPB 500 mg/100 mL premix  500 mg Intravenous Q12H Osvaldo Shipper, MD 400 mL/hr at 02/29/24 0956 500 mg at 02/29/24 0956   ondansetron (ZOFRAN) tablet 4 mg  4 mg Oral Q6H PRN Osvaldo Shipper, MD       Or   ondansetron [facility]) injection 4 mg  4 mg  Intravenous Q6H PRN Osvaldo Shipper, MD       oxyCODONE (Oxy IR/ROXICODONE) immediate release tablet 5 mg  5 mg Oral Q6H PRN Gonfa, Taye T, MD       pantoprazole (PROTONIX) EC tablet 40 mg  40 mg Oral Daily Candelaria Stagers T, MD   40 mg at 02/29/24 0931   QUEtiapine (SEROQUEL) tablet 25 mg  25 mg Oral QHS Candelaria Stagers T, MD   25 mg at 02/28/24 2141   tamsulosin (FLOMAX) capsule 0.4 mg  0.4 mg Oral Daily Candelaria Stagers T, MD   0.4 mg at 02/29/24 1478    REVIEW OF SYSTEMS:   Patient was agitated saying he is fine and wanted to leave.  PHYSICAL EXAMINATION:  Vitals:   02/29/24 0754 02/29/24 1129  BP: 111/76 124/89  Pulse: (!) 101 (!) 106  Resp: 18 19  Temp: (!) 97.4 F (36.3 C) (!) 97.5 F (36.4 C)  SpO2: 97% 95%   Filed Weights   02/28/24 1419  Weight: 246 lb 14.6 oz (112 kg)    GENERAL: alert, repeated says wants to leave  SKIN: skin color normal. No jaundice LUNGS: normal breathing effort.   NEURO: alert but agitated when trying to explain his situation to him   LABORATORY DATA:  I have reviewed the data as listed Lab Results  Component Value Date   WBC 9.8 02/28/2024   HGB 10.2 (L) 02/28/2024   HCT 32.3 (L) 02/28/2024   MCV 79.2 (L) 02/28/2024   PLT 435 (H) 02/28/2024   Recent Labs    12/06/23 1049 01/01/24 1114 02/26/24 2013 02/28/24 0207  NA 132* 133* 138 132*  K 5.4* 3.8 4.1 3.8  CL 103 99 102 102  CO2 19* 22 24 20*  GLUCOSE 306* 151* 196* 341*  BUN 26* 18 13 26*  CREATININE 1.29* 1.35* 1.40* 1.64*  CALCIUM 9.1 9.6 9.5 8.7*  GFRNONAA >60 60* 57* 47*  PROT 7.0 7.6  --  6.0*  ALBUMIN 3.6 4.2  --  2.5*  AST 18 25  --  14*  ALT 21 18  --  16  ALKPHOS 82 103  --  105  BILITOT 0.6 0.7  --  0.6    RADIOGRAPHIC STUDIES: I have personally reviewed the radiological images as listed and agreed with the findings in the report. CT CHEST ABDOMEN PELVIS W CONTRAST Result Date: 02/28/2024 CLINICAL DATA:  Brain metastases, unknown primary * Tracking Code: BO * EXAM: CT  CHEST, ABDOMEN, AND PELVIS WITH CONTRAST TECHNIQUE: Multidetector CT imaging of the chest, abdomen and pelvis was performed following the standard protocol during bolus administration of intravenous contrast. RADIATION DOSE REDUCTION: This exam was performed according to the departmental dose-optimization program which includes automated exposure control, adjustment of the mA and/or kV according to patient size and/or use of iterative reconstruction technique. CONTRAST:  75mL OMNIPAQUE IOHEXOL 350 MG/ML SOLN COMPARISON:  CT abdomen pelvis, 12/18/2023 FINDINGS: CT CHEST FINDINGS Cardiovascular: No significant extracardiac vascular findings. Normal heart size. Scattered left coronary artery calcifications no pericardial effusion. Mediastinum/Nodes: No enlarged mediastinal, hilar, or axillary lymph nodes. Thyroid gland, trachea, and esophagus demonstrate no significant findings. Lungs/Pleura: Minimal paraseptal emphysema. Diffuse bilateral bronchial wall thickening. Background of fine centrilobular pulmonary nodules, most concentrated in the lung apices. Mild, bandlike bibasilar scarring. No pleural effusion or pneumothorax. Musculoskeletal: No chest wall abnormality. No acute osseous findings. CT ABDOMEN PELVIS FINDINGS Hepatobiliary: Hepatomegaly, maximum coronal span 20.4 cm. New hypodense lesion of the posterior liver dome, hepatic segment VII, measuring 1.5 x 0.1 cm (series 3, image 50). No gallstones, gallbladder wall thickening, or biliary dilatation. Pancreas: Unremarkable. No pancreatic ductal dilatation or surrounding inflammatory changes. Spleen: Normal in size without significant abnormality. Adrenals/Urinary Tract: Adrenal glands are unremarkable. Simple, benign renal cortical cysts, for which no further follow-up or characterization is required. Kidneys are otherwise normal, without obvious renal calculi, solid lesion, or hydronephrosis. Bladder is unremarkable. Stomach/Bowel: Stomach is within normal  limits. Appendix not clearly visualized. No evidence of bowel wall thickening, distention, or inflammatory changes. Sigmoid diverticulosis. Vascular/Lymphatic: No significant vascular findings are present. No enlarged abdominal or pelvic lymph nodes. Reproductive: Interval enlargement of a soft tissue mass interposed between the rectum and prostate, measuring 6.5 x 4.1 cm, previously 4.6 x 2.7 cm when measured with similar technique (series 3, image 121). There is otherwise mild prostatomegaly. Other: Small fat containing bilateral inguinal hernias. Mild anasarca. No ascites. Musculoskeletal: No acute osseous findings. Disc degenerative disease and bridging osteophytosis throughout the thoracic and lumbar spine, in keeping with advanced DISH. IMPRESSION: 1. Interval enlargement of a soft tissue mass interposed between the rectum and prostate, measuring 6.5 x 4.1 cm, previously 4.6 x 2.7 cm when measured with similar technique. Reportedly, patient carries a known diagnosis of prostate malignancy. 2. New hypodense lesion of the posterior liver dome, hepatic segment VII, measuring 1.5 x 1.1 cm, consistent with a small hepatic metastasis. 3. No other evidence of lymphadenopathy or metastatic disease in the chest, abdomen, or pelvis. 4. Emphysema and smoking-related respiratory bronchiolitis. 5. Coronary artery disease. Emphysema (ICD10-J43.9). Electronically Signed   By: Jearld Lesch M.D.   On: 02/28/2024 20:02   EEG adult Result Date: 02/27/2024 Charlsie Quest, MD     02/27/2024 10:30 AM Patient Name: Thomas Donovan Epilepsy Attending: Charlsie Quest Referring Physician/Provider: Caryl Pina, MD Date: 02/27/2024 Duration: 27.35 mins Patient history: 63yo M with ams. EEG to evaluate for seizure Level of alertness: Asleep AEDs during EEG study: LEV, Dexamethasone, Ativan Technical aspects: This EEG study was done with scalp electrodes positioned according to the 10-20 International system of  electrode placement. Electrical activity was reviewed with band pass filter of 1-70Hz , sensitivity of 7 uV/mm, display speed of 88mm/sec with a 60Hz  notched filter applied as appropriate. EEG data were recorded continuously and digitally stored.  Video monitoring was available and reviewed as appropriate. Description: Sleep was characterized by sleep spindles (12 to 14 Hz), maximal frontocentral region. Hyperventilation and photic stimulation were not performed.   IMPRESSION: This study during sleep only is within normal limits. No seizures or epileptiform discharges were seen throughout the recording.  If suspicion for ictal- interictal activity remains a concern, a prolonged study can be considered. Charlsie Quest   MR BRAIN W WO CONTRAST Result Date: 02/27/2024 CLINICAL DATA:  63 year old male with altered mental status. Unwitnessed fall. Left MCA infarct versus tumor on head CT/CTA. EXAM: MRI HEAD WITHOUT AND WITH CONTRAST TECHNIQUE: Multiplanar, multiecho pulse sequences of the brain and surrounding structures were obtained without and with intravenous contrast. CONTRAST:  10mL GADAVIST GADOBUTROL 1 MMOL/ML IV SOLN COMPARISON:  Head CT yesterday, CTA head and neck 0047 hours today. FINDINGS: Brain: Large round and lobulated, intensely enhancing left middle cranial fossa mass. The enhancing component encompasses 42 x 45 x 41 mm and is inseparable from the left cavernous sinus (series 8, image 21). This was largely isodense by CT. Extensive tumoral edema in the left hemisphere with mostly a vasogenic pattern including tracking into all deep white matter capsules, the deep gray nuclei. Along the posterosuperior margin of the dominant enhancing component is a confluent area of hemosiderin and wagon-wheel-spoke type appearance on precontrast T2, which is partially enhancing postcontrast (series 4, image 26). Initial coronal T2 weighted images at 0317 hours today were motion degraded but repeat coronal T2 at 0853  hours, series 6, images 15 and 16 suggest a CSF gap between the enhancing component of the mass and adjacent left temporal lobe. And furthermore, there is strong evidence of tumor invasion through the bone of the left middle cranial fossa, into the left infratemporal fossa with solid enhancing tumor there (series 3, image 19 versus series 9, image 17) and also into the posterior left orbit (series 4, image 22). Sclerotic and mildly hyperostotic bone in these areas by CT. And there is abnormal signal in the left pterygoid and temporalis muscles. Therefore, all told the bulky left middle cranial fossa tumor is at least 62 x 56 by 69 mm (AP by transverse by CC). Furthermore there is an expansile tumor of the right mandible ramus and condyle (series 3 image 4 and series 9, image 4 estimated at 34 mm diameter and abnormally enhancing. Right mandible condyle permeated and partially destroyed by CT. Associated intracranial mass effect with rightward midline shift of 10 mm and subtotal effacement of the left lateral ventricle. No ventriculomegaly. Basilar cisterns remain patent. No restricted diffusion suggestive of infarction. No other cerebral edema identified. No other cerebral blood products identified. Vascular: Major intracranial vascular flow voids are preserved. There is mass effect on the left ICA terminus, left MCA branches. Skull and upper cervical spine: Background bone marrow signal is within normal limits. Grossly negative visible cervical spine. Sinuses/Orbits: Infiltrative tumor in the posterior left orbit, along the left lateral wall of that orbit. Underlying abnormal bone. No significant exophthalmos. No other abnormal intraorbital mass or enhancement is evident. But there is also tumor infiltration of the left sphenoid and maxillary sinuses, also confirmed by CT. Other: Mastoids remain clear. Grossly negative visible internal auditory structures. IMPRESSION: 1. Motion degraded exam. 2. 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 Estimated tumor long-axis nearly 7 cm. Confluent enhancing intracranial component is 4-5 cm. AND a second similar bony destructive and expansile enhancing Tumor of the Right Mandible Condyle is 3-4 cm. 3. Multiplicity and constellation of imaging findings favors Metastases over other considerations such as invasive Meningioma, high-grade glioma, or Lymphoma. 4. Extensive probably vasogenic tumoral edema in the left cerebral hemisphere with intracranial mass effect including rightward midline shift of 10 mm. Basilar cisterns remain patent. No evidence of acute cerebral infarct. Electronically Signed   By: Odessa Fleming M.D.   On: 02/27/2024 09:45   DG Femur Portable 1 View Right Result Date: 02/27/2024 CLINICAL DATA:  MRI clearance EXAM: RIGHT FEMUR PORTABLE 1 VIEW COMPARISON:  None Available. FINDINGS: There is no evidence of fracture or other focal bone lesions. Soft tissues are unremarkable. No radiopaque foreign body is noted. IMPRESSION: No acute abnormality noted. Electronically Signed   By: Alcide Clever M.D.   On: 02/27/2024 03:41   DG Femur Portable 1 View Left Result Date: 02/27/2024 CLINICAL DATA:  MRI clearance EXAM: LEFT FEMUR PORTABLE 1 VIEW COMPARISON:  None Available. FINDINGS: There is no evidence of fracture or other focal bone lesions. Soft tissues are unremarkable. No foreign body seen. IMPRESSION: No acute abnormality noted. Electronically Signed   By: Alcide Clever M.D.   On: 02/27/2024 03:41   DG Humerus Right Result Date: 02/27/2024 CLINICAL DATA:  MRI screening EXAM: RIGHT HUMERUS - 2+ VIEW COMPARISON:  None Available. FINDINGS: No acute fracture is noted. A needle is noted overlying the right arm likely extrinsic to the patient. No other focal abnormality is noted. IMPRESSION: Needle is  noted over the mid upper arm likely extrinsic to the patient. Correlate with the physical exam. Electronically Signed   By: Alcide Clever M.D.   On: 02/27/2024 03:41   DG Humerus Left Result Date: 02/27/2024 CLINICAL DATA:  Screening for MRI EXAM: LEFT HUMERUS - 2+ VIEW COMPARISON:  None Available. FINDINGS: No acute fractures noted. No radiopaque foreign body is noted. Degenerative changes of the acromioclavicular joint are seen. IMPRESSION: No foreign body noted. Electronically Signed   By: Alcide Clever M.D.   On: 02/27/2024 03:39   CT ANGIO HEAD NECK W WO CM Result Date: 02/27/2024 CLINICAL DATA:  Stroke, follow up EXAM: CT ANGIOGRAPHY HEAD AND NECK WITH AND WITHOUT CONTRAST TECHNIQUE: Multidetector CT imaging of the head and neck was performed using the standard protocol during bolus administration of intravenous contrast. Multiplanar CT image reconstructions and MIPs were obtained to evaluate the vascular anatomy. Carotid stenosis measurements (when applicable) are obtained utilizing NASCET criteria, using the distal internal carotid diameter as the denominator. RADIATION DOSE REDUCTION: This exam was performed according to the departmental dose-optimization program which includes automated exposure control, adjustment of the mA and/or kV according to patient size and/or use of iterative reconstruction technique. CONTRAST:  75mL OMNIPAQUE IOHEXOL 350 MG/ML SOLN COMPARISON:  None Available. FINDINGS: CTA NECK FINDINGS Aortic arch: Great vessel origins are patent without significant stenosis. Right carotid system: No evidence of dissection, stenosis (50% or greater), or occlusion. Left carotid system: No evidence of dissection, stenosis (50% or greater), or occlusion. Vertebral arteries: Left dominant. No evidence of dissection, stenosis (50% or greater), or occlusion. Skeleton: No acute abnormality on limited assessment. Other neck: No acute abnormality on limited assessment. Upper  chest: Visualized lung  apices are clear. 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 Review of the MIP images confirms the above findings CTA HEAD FINDINGS Anterior circulation: No significant stenosis, proximal occlusion, aneurysm, or vascular malformation. Posterior circulation: No significant stenosis, proximal occlusion, aneurysm, or vascular malformation. Venous sinuses: As permitted by contrast timing, patent. Review of the MIP images confirms the above findings IMPRESSION: 1. Although incompletely assessed on this study, suspected enhancing mass in the anterior left temporal lobe which likely is the cause of the patient's extensive left cerebral edema. Recommend MRI of the head with contrast to confirm and further evaluate. 2. No large vessel occlusion or proximal hemodynamically significant stenosis. Findings discussed with Dr. Blinda Leatherwood via telephone at 2:44 a.m. Electronically Signed   By: Feliberto Harts M.D.   On: 02/27/2024 02:45   DG Chest Port 1 View Result Date: 02/26/2024 CLINICAL DATA:  Altered mental status. EXAM: PORTABLE CHEST 1 VIEW COMPARISON:  January 24, 2024 FINDINGS: The heart size and mediastinal contours are within normal limits. Both lungs are clear. Chronic and degenerative changes are seen involving the right shoulder. The visualized skeletal structures are otherwise unremarkable. IMPRESSION: No active disease. Electronically Signed   By: Aram Candela M.D.   On: 02/26/2024 23:05   CT Cervical Spine Wo Contrast Result Date: 02/26/2024 CLINICAL DATA:  Unwitnessed fall. EXAM: CT CERVICAL SPINE WITHOUT CONTRAST TECHNIQUE: Multidetector CT imaging of the cervical spine was performed without intravenous contrast. Multiplanar CT image reconstructions were also generated. RADIATION DOSE REDUCTION: This exam was performed according to the departmental  dose-optimization program which includes automated exposure control, adjustment of the mA and/or kV according to patient size and/or use of iterative reconstruction technique. COMPARISON:  None Available. FINDINGS: Alignment: Normal. Skull base and vertebrae: No acute fracture. No primary bone lesion or focal pathologic process. Soft tissues and spinal canal: No prevertebral fluid or swelling. No visible canal hematoma. Disc levels: Moderate to marked severity endplate sclerosis, anterior osteophyte formation and posterior bony spurring are seen at the level of C5-C6. Mild multilevel endplate sclerosis is seen throughout the remainder of the cervical spine. Mild intervertebral disc space narrowing is seen at the levels of C5-C6 and C6-C7. Bilateral moderate to marked severity multilevel facet joint hypertrophy is noted, right greater than left. Upper chest: Negative. Other: A 9 mm diameter thyroid nodule is seen within the left lobe of the thyroid gland. IMPRESSION: 1. No acute fracture or subluxation in the cervical spine. 2. Moderate to marked severity degenerative changes, most prominent at the level of C5-C6. 3. 9 mm diameter thyroid nodule within the left lobe of the thyroid gland. No follow-up imaging is recommended. This follows ACR consensus guidelines: Managing Incidental Thyroid Nodules Detected on Imaging: White Paper of the ACR Incidental Thyroid Findings Committee. J Am Coll Radiol 2015; 12:143-150. Electronically Signed   By: Aram Candela M.D.   On: 02/26/2024 23:04   CT Head Wo Contrast Result Date: 02/26/2024 CLINICAL DATA:  Unwitnessed fall. EXAM: CT HEAD WITHOUT CONTRAST TECHNIQUE: Contiguous axial images were obtained from the base of the skull through the vertex without intravenous contrast. RADIATION DOSE REDUCTION: This exam was performed according to the departmental dose-optimization program which includes automated exposure control, adjustment of the mA and/or kV according to patient  size and/or use of iterative reconstruction technique. COMPARISON:  None Available. FINDINGS: Brain: A large area of  cortical and white matter low attenuation is seen throughout the entire left parietal lobe. There is associated mass effect on the adjacent sulci and left lateral ventricle with approximate 7 mm left-to-right midline shift. No evidence of hemorrhage, hydrocephalus or extra-axial collection or mass lesion. Vascular: No hyperdense vessel or unexpected calcification. Skull: Normal. Negative for fracture or focal lesion. Sinuses/Orbits: There is mild left maxillary sinus, left-sided sphenoid sinus and left ethmoid sinus mucosal thickening. Other: None. IMPRESSION: 1. 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 MRI correlation is recommended. 2. Associated mass effect on the adjacent sulci and left lateral ventricle with approximate 7 mm left-to-right midline shift. 3. Mild left maxillary sinus, left-sided sphenoid sinus and left ethmoid sinus disease. Electronically Signed   By: Aram Candela M.D.   On: 02/26/2024 23:00

## 2024-02-29 NOTE — Consult Note (Signed)
 Palliative Care Consult Note                                  Date: 02/29/2024   Patient Name: Thomas Donovan  DOB: May 22, 1961  MRN: [id number]  Age / Sex: 63 y.o., male  PCP: Pcp, No Referring Physician: Starleen Arms, MD  Reason for Consultation: Establishing goals of care  HPI/Patient Profile: 63 y.o. male  with past medical history of A-fib not on anticoagulation, type 2 diabetes, CAD, HTN, pelvic mass concerning for prostate cancer, and questionable cirrhosis who was brought to [facility] ED from [facility] jail on 02/26/2024 with altered mental status after an unwitnessed fall.  CT scan showed concern for a large stroke versus possible brain mass.  Patient was transferred to [facility] for MRI.  MRI brain shows large skull base invasive multi spatial tumor at the left middle cranial fossa concerning for metastases, and extensive vasogenic edema in the left cerebral hemisphere with right midline shift.  Palliative Medicine was consulted for goals of care discussions.    Past Medical History:  Diagnosis Date   Aortic aneurysm (HCC) 2019   Chronic back pain    Coronary artery disease    Degeneration of lumbar intervertebral disc    Diabetes mellitus    GERD (gastroesophageal reflux disease)    Gout    History of kidney stones    Hypertension    Pneumonia     Clinical Assessment and Goals of Care:   Extensive chart review has been completed including labs, vital signs, imaging, progress/consult notes, orders, medications and available advance directive documents.   Patient has been seen by oncology and neurosurgery. Overall prognosis is poor. 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 Per neurosurgery, surgical resection would have high risk of major neurologic deficit and would not likely provide any significant improvement of his overall prognosis.  Recommendation is for comfort care.   I assessed the patient at bedside. He is oriented to self only. He does tell me has "been sick" and "has cancer" but otherwise seems to have limited insight into his current clinical situation. He cannot participate in meaningful GOC discussion or make complex medical decisions at this time.   Patient's documented next of kin is his wife/Michelle. Unfortunately, I have been unable to reach her on multiple attempts.  Dr. Randol Kern spoke with wife by phone yesterday evening 3/6 (see note from 6:06 pm) and updated her that patient likely has stage IV malignancy, most likely aggressive prostate cancer with poor prognosis with recommendation for hospice/comfort.  He also discussed code status and she was agreeable to DNR.   TOC has made a referral to [facility] for hospice services.    Review of Systems  Unable to perform ROS   Objective:   Primary Diagnoses: Present on Admission:  Brain tumor [facility])  Atrial fibrillation, chronic (HCC)   Physical Exam Vitals reviewed.  Constitutional:      General: He is not in acute distress.    Appearance: He is ill-appearing.  Pulmonary:     Effort: Pulmonary effort is  normal.  Neurological:     Mental Status: He is alert. He is confused.     Palliative Assessment/Data: PPS 40%     Assessment & Plan:   SUMMARY OF RECOMMENDATIONS   Agree with DNR/DNI Continue current supportive care Agree with hospice referral PMT will continue attempts to contact patient's wife  Primary Decision Maker: NEXT OF KIN  Symptom Management:  Agree with oxycodone IR 5 mg every 6 hours as needed for moderate pain OR Dilaudid 0.5 mg IV every 4 hours as needed for severe pain Agree with Ativan 1 mg IV every 4 hours as needed for anxiety or seizure Continue dexamethasone 4 mg IV every 6 hours for edema Continue Keppra 500 mg twice daily for seizure prophylaxis  Prognosis:  < 6 months  Discharge Planning:  To Be  Determined    Discussed with: Dr. Randol Kern, [facility], hospice liaison   Thank you for allowing [place] to participate in the care of [name]   Time Total: 55 minutes  Detailed review of medical records (labs, imaging, vital signs), medically appropriate exam, discussed with treatment team, counseling and education to patient, & staff, documenting clinical information, coordination of care.    Signed by: Sherlean Foot, NP Palliative Medicine Team  Team Phone # [phone]  For individual providers, please see AMION

## 2024-02-29 NOTE — Plan of Care (Signed)

## 2024-02-29 NOTE — Plan of Care (Signed)

## 2024-02-29 NOTE — Care Management (Signed)
 Transition of Care [facility]) - Inpatient Brief Assessment   Patient Details  Name: Thomas Donovan MRN: [id number] Date of Birth: Dec 07, 1961  Transition of Care [facility]) CM/SW Contact:    Lockie Pares, RN Phone Number: 02/29/2024, 8:44 AM   Clinical Narrative:  63 year old presented with fall from [place] [facility]. He was only alert to self. Found large brain tumor, abdominal mass and possible liver involvement. Palliative has been consulted for goals of care. No plans for surgical intervention at this time.   TOC will follow for needs , recommendations, and transitions of care.   Transition of Care Asessment: Insurance and Status: Insurance coverage has been reviewed Patient has primary care physician: No Home environment has been reviewed: from [facility] Prior level of function:: independent, incarceratied Prior/Current Home Services: No current home services Social Drivers of Health Review: SDOH reviewed no interventions necessary Readmission risk has been reviewed: Yes Transition of care needs: no transition of care needs at this time

## 2024-02-29 NOTE — Progress Notes (Signed)
 Providing Compassionate, Quality Care - Together   Subjective: Patient resting comfortably. Bedside RN reports no new issues.  Objective: Vital signs in last 24 hours: Temp:  [97.4 F (36.3 C)-97.7 F (36.5 C)] 97.6 F (36.4 C) (03/06 1600) Pulse Rate:  [95-106] 106 (03/06 1129) Resp:  [18-20] 19 (03/06 1600) BP: (111-143)/(66-89) 135/66 (03/06 1600) SpO2:  [94 %-98 %] 98 % (03/06 1600)  Intake/Output from previous day: 03/05 0701 - 03/06 0700 In: 293.7 [IV Piggyback:293.7] Out: 1650 [Urine:1650] Intake/Output this shift: Total I/O In: 780 [P.O.:780] Out: 650 [Urine:650]  Responds to voice Oriented to self PERRLA Speech clear MAE, S/S intact    Lab Results: Recent Labs    02/26/24 2013 02/28/24 0207  WBC 12.0* 9.8  HGB 11.2* 10.2*  HCT 36.2* 32.3*  PLT 437* 435*   BMET Recent Labs    02/26/24 2013 02/28/24 0207  NA 138 132*  K 4.1 3.8  CL 102 102  CO2 24 20*  GLUCOSE 196* 341*  BUN 13 26*  CREATININE 1.40* 1.64*  CALCIUM 9.5 8.7*    Studies/Results: CT CHEST ABDOMEN PELVIS W CONTRAST Result Date: 02/28/2024 CLINICAL DATA:  Brain metastases, unknown primary * Tracking Code: BO * EXAM: CT CHEST, ABDOMEN, AND PELVIS WITH CONTRAST TECHNIQUE: Multidetector CT imaging of the chest, abdomen and pelvis was performed following the standard protocol during bolus administration of intravenous contrast. RADIATION DOSE REDUCTION: This exam was performed according to the departmental dose-optimization program which includes automated exposure control, adjustment of the mA and/or kV according to patient size and/or use of iterative reconstruction technique. CONTRAST:  75mL OMNIPAQUE IOHEXOL 350 MG/ML SOLN COMPARISON:  CT abdomen pelvis, 12/18/2023 FINDINGS: CT CHEST FINDINGS Cardiovascular: No significant extracardiac vascular findings. Normal heart size. Scattered left coronary artery calcifications no pericardial effusion. Mediastinum/Nodes: No enlarged mediastinal,  hilar, or axillary lymph nodes. Thyroid gland, trachea, and esophagus demonstrate no significant findings. Lungs/Pleura: Minimal paraseptal emphysema. Diffuse bilateral bronchial wall thickening. Background of fine centrilobular pulmonary nodules, most concentrated in the lung apices. Mild, bandlike bibasilar scarring. No pleural effusion or pneumothorax. Musculoskeletal: No chest wall abnormality. No acute osseous findings. CT ABDOMEN PELVIS FINDINGS Hepatobiliary: Hepatomegaly, maximum coronal span 20.4 cm. New hypodense lesion of the posterior liver dome, hepatic segment VII, measuring 1.5 x 0.1 cm (series 3, image 50). No gallstones, gallbladder wall thickening, or biliary dilatation. Pancreas: Unremarkable. No pancreatic ductal dilatation or surrounding inflammatory changes. Spleen: Normal in size without significant abnormality. Adrenals/Urinary Tract: Adrenal glands are unremarkable. Simple, benign renal cortical cysts, for which no further follow-up or characterization is required. Kidneys are otherwise normal, without obvious renal calculi, solid lesion, or hydronephrosis. Bladder is unremarkable. Stomach/Bowel: Stomach is within normal limits. Appendix not clearly visualized. No evidence of bowel wall thickening, distention, or inflammatory changes. Sigmoid diverticulosis. Vascular/Lymphatic: No significant vascular findings are present. No enlarged abdominal or pelvic lymph nodes. Reproductive: Interval enlargement of a soft tissue mass interposed between the rectum and prostate, measuring 6.5 x 4.1 cm, previously 4.6 x 2.7 cm when measured with similar technique (series 3, image 121). There is otherwise mild prostatomegaly. Other: Small fat containing bilateral inguinal hernias. Mild anasarca. No ascites. Musculoskeletal: No acute osseous findings. Disc degenerative disease and bridging osteophytosis throughout the thoracic and lumbar spine, in keeping with advanced DISH. IMPRESSION: 1. Interval  enlargement of a soft tissue mass interposed between the rectum and prostate, measuring 6.5 x 4.1 cm, previously 4.6 x 2.7 cm when measured with similar technique. Reportedly, patient carries a known  diagnosis of prostate malignancy. 2. New hypodense lesion of the posterior liver dome, hepatic segment VII, measuring 1.5 x 1.1 cm, consistent with a small hepatic metastasis. 3. No other evidence of lymphadenopathy or metastatic disease in the chest, abdomen, or pelvis. 4. Emphysema and smoking-related respiratory bronchiolitis. 5. Coronary artery disease. Emphysema (ICD10-J43.9). Electronically Signed   By: Jearld Lesch M.D.   On: 02/28/2024 20:02    Assessment/Plan: Patient with brain mass that is likely metastatic prostate cancer. Given the patient's significant metastatic disease and the location of his brain tumor, would not recommend surgical intervention for resection of his tumor. Palliative measures might be the best option for the patient at this point.   LOS: 2 days    I am in communication with my attending and they agree with the plan for this patient.   Val Eagle, DNP, AGNP-C Nurse Practitioner  [facility] [address], [address], [place], [place] [address] P: [phone]    F: [phone]  02/29/2024, 11:58 PM

## 2024-02-29 NOTE — Progress Notes (Signed)
 Patient has been released from [place] custody. They gave me the wife's contact information. Michelle [phone] or [phone] or [phone] or [phone]. Sheriff Deputy advised she can be reached at any of these numbers, it is okay to contact communicate with family now and update family, discussed with his wife Dalene Carrow, updated her about his diagnosis, likely stage IV malignancy, prostate is a primary, prognosis remains poor, most likely this is aggressive prostate cancer, I have discussed with her we will await official oncology consultation, but likely this is not a curable cancer, unlikely decision will be towards hospice/comfort, she is understanding, discussed with her that palliative currently involved, and they will communicate with her tomorrow after oncology evaluation this evening, discussed with her CODE STATUS, patient is currently DNR, no heroics. Huey Bienenstock MD

## 2024-02-29 NOTE — Progress Notes (Signed)
 PROGRESS NOTE  Thomas Donovan ZOX:[id number] DOB: 05-30-1961   PCP: Pcp, No  Patient is from: [place]  DOA: 02/26/2024 LOS: 2  Chief complaints Chief Complaint  Patient presents with   Altered Mental Status     Brief Narrative / Interim history:  63 year old M with PMH of A-fib not on AC, IDDM-2, HTN, CAD, pelvic mass concerning for prostate cancer and questionable cirrhosis brought to [facility] ED from jail due to possible seizure/syncope.  Evaluation raise concern for stroke versus brain lesion and he was transferred to [facility] for further evaluation.  Patient was obtunded on arrival to [facility].  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  Neurosurgery consulted.  Patient was started on Decadron and Keppra.  PSA elevated to 176.  Per report from a nurse at the jail, patient only takes 2 medications including tamsulosin and Cardizem.  Subjective:  No significant events overnight as discussed with staff, patient remains confused, but he denies any complaints today, he pulled his IV mistakenly today, he is does not know where he is right now. Objective: Vitals:   02/29/24 0012 02/29/24 0400 02/29/24 0754 02/29/24 1129  BP: (!) 143/88 121/74 111/76 124/89  Pulse: 100 95 (!) 101 (!) 106  Resp: 20 19 18 19   Temp: 97.6 F (36.4 C) 97.7 F (36.5 C) (!) 97.4 F (36.3 C) (!) 97.5 F (36.4 C)  TempSrc: Oral Oral Oral Oral  SpO2: 96% 94% 97% 95%  Weight:      Height:        Examination:  Awake Alert, is not, conversant, but oriented x  1 Symmetrical Chest wall movement, Good air movement bilaterally, CTAB RRR,No Gallops,Rubs or new Murmurs, No Parasternal Heave +ve B.Sounds, Abd Soft, No tenderness, No rebound - guarding or rigidity. 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  Has history of pelvic mass concerning for prostate cancer.  Had sigmoidoscopy with negative pathology in 10/2023.  He was to follow-up with urology for prostate biopsy at that time.  His PSA is elevated to 170s. -Neurosurgery input greatly appreciated. -CT abdomen pelvis significant for pelvic mass, liver metastasis as well -Oncology consulted -Palliative medicine consult -Neurology input greatly appreciated, continue Decadron and Keppra   Acute encephalopathy Seizures - likely due to brain mass with vasogenic edema.  -Neurology input greatly appreciated, continue with Keppra and Decadron.  History of PAF:  -On Cardizem drip initially, has been transitioned to as needed Cardizem, will change to short acting Cardizem scheduled  -Hold anticoagulation due to brain metastasis   Poorly controlled DM-2 with hyperglycemia: A1c 7.5% on 12/3. Recent Labs  Lab 02/28/24 1716 02/28/24 2049 02/29/24 0755 02/29/24 1124 02/29/24 1404  GLUCAP 362* 263* 268* 448* 319*  -Poorly controlled likely due to steroids, increased Lantus, sliding scale and Premeal NovoLog  History of liver cirrhosis: CT raises concern for this.  No signs of ascites.  LFT within normal -Follow CT abdomen and pelvis  Essential hypertension: Normotensive -Continue with Cardizem  Normocytic anemia: Stable -Continue monitoring  Dysphagia: Likely due  to #1 -Dysphagia 3 diet per SLP  Body mass index is 31.7 kg/m.           DVT prophylaxis:  SCDs Start: 02/27/24 1224  Code Status: Full code Family Communication: Public relations account executive at bedside Level of care: Progressive Status is: Inpatient Remains inpatient appropriate because: Intracranial mass, encephalopathy   Final disposition: To be determined Consultants:  Neurosurgery Palliative medicine Oncology     Sch Meds:  Scheduled Meds:  allopurinol  300 mg Oral Daily   dexamethasone (DECADRON) injection  4 mg Intravenous Q6H   diltiazem  30 mg Oral Q6H   insulin aspart  0-20 Units Subcutaneous TID WC   insulin aspart  0-5 Units Subcutaneous QHS   insulin aspart  4 Units Subcutaneous TID WC   insulin glargine  25 Units Subcutaneous BID   insulin glargine  5 Units Subcutaneous Once   pantoprazole  40 mg Oral Daily   QUEtiapine  25 mg Oral QHS   tamsulosin  0.4 mg Oral Daily   Continuous Infusions:  levETIRAcetam 500 mg (02/29/24 0956)   PRN Meds:.acetaminophen, haloperidol lactate, HYDROmorphone (DILAUDID) injection, ondansetron **OR** ondansetron (ZOFRAN) IV, oxyCODONE  Antimicrobials: Anti-infectives (From admission, onward)    None        I have personally reviewed the following labs and images: CBC: Recent Labs  Lab 02/26/24 2013 02/28/24 0207  WBC 12.0* 9.8  HGB 11.2* 10.2*  HCT 36.2* 32.3*  MCV 81.0 79.2*  PLT 437* 435*   BMP &GFR Recent Labs  Lab 02/26/24 2013 02/28/24 0207  NA 138 132*  K 4.1 3.8  CL 102 102  CO2 24 20*  GLUCOSE 196* 341*  BUN 13 26*  CREATININE 1.40* 1.64*  CALCIUM 9.5 8.7*   Estimated Creatinine Clearance: 63 mL/min (A) (by C-G formula based on SCr of 1.64 mg/dL (H)). Liver & Pancreas: Recent Labs  Lab 02/28/24 0207  AST 14*  ALT 16  ALKPHOS 105  BILITOT 0.6  PROT 6.0*  ALBUMIN 2.5*   No results for input(s): "LIPASE", "AMYLASE" in the last 168 hours. Recent Labs  Lab 02/26/24 2013  02/28/24 2149  AMMONIA 13 15   Diabetic: No results for input(s): "HGBA1C" in the last 72 hours. Recent Labs  Lab 02/28/24 1716 02/28/24 2049 02/29/24 0755 02/29/24 1124 02/29/24 1404  GLUCAP 362* 263* 268* 448* 319*   Cardiac Enzymes: No results for input(s): "CKTOTAL", "CKMB", "CKMBINDEX", "TROPONINI" in the last 168 hours. No results for input(s): "PROBNP" in the last 8760 hours. Coagulation Profile: No results for input(s): "INR", "PROTIME" in the last 168 hours. Thyroid Function Tests: Recent Labs    02/28/24 2149  TSH 2.350   Lipid Profile: No results for input(s): "CHOL", "HDL", "LDLCALC", "TRIG", "CHOLHDL", "LDLDIRECT" in the last 72 hours. Anemia Panel: Recent Labs    02/28/24 2149  VITAMINB12 331   Urine analysis:    Component Value Date/Time   COLORURINE YELLOW 02/26/2024 2023   APPEARANCEUR HAZY (A) 02/26/2024 2023   LABSPEC 1.019 02/26/2024 2023   PHURINE 5.0 02/26/2024 2023   GLUCOSEU 50 (A) 02/26/2024 2023   HGBUR NEGATIVE 02/26/2024 2023   BILIRUBINUR NEGATIVE 02/26/2024 2023   KETONESUR NEGATIVE 02/26/2024 2023   PROTEINUR 100 (A) 02/26/2024 2023   UROBILINOGEN 0.2 01/01/2015  0912   NITRITE NEGATIVE 02/26/2024 2023   LEUKOCYTESUR NEGATIVE 02/26/2024 2023   Sepsis Labs: Invalid input(s): "PROCALCITONIN", "LACTICIDVEN"  Microbiology: Recent Results (from the past 240 hours)  Resp panel by RT-PCR (RSV, Flu A&B, Covid) Anterior Nasal Swab     Status: None   Collection Time: 02/26/24  7:44 PM   Specimen: Anterior Nasal Swab  Result Value Ref Range Status   SARS Coronavirus 2 by RT PCR NEGATIVE NEGATIVE Final    Comment: (NOTE) SARS-CoV-2 target nucleic acids are NOT DETECTED.  The SARS-CoV-2 RNA is generally detectable in upper respiratory specimens during the acute phase of infection. The lowest concentration of SARS-CoV-2 viral copies this assay can detect is 138 copies/mL. A negative result does not preclude SARS-Cov-2 infection and  should not be used as the sole basis for treatment or other patient management decisions. A negative result may occur with  improper specimen collection/handling, submission of specimen other than nasopharyngeal swab, presence of viral mutation(s) within the areas targeted by this assay, and inadequate number of viral copies(<138 copies/mL). A negative result must be combined with clinical observations, patient history, and epidemiological information. The expected result is Negative.  Fact Sheet for Patients:  BloggerCourse.com  Fact Sheet for Healthcare Providers:  SeriousBroker.it  This test is no t yet approved or cleared by the [place] FDA and  has been authorized for detection and/or diagnosis of SARS-CoV-2 by FDA under an Emergency Use Authorization (EUA). This EUA will remain  in effect (meaning this test can be used) for the duration of the COVID-19 declaration under Section 564(b)(1) of the Act, 21 U.S.C.section 360bbb-3(b)(1), unless the authorization is terminated  or revoked sooner.       Influenza A by PCR NEGATIVE NEGATIVE Final   Influenza B by PCR NEGATIVE NEGATIVE Final    Comment: (NOTE) The Xpert Xpress SARS-CoV-2/FLU/RSV plus assay is intended as an aid in the diagnosis of influenza from Nasopharyngeal swab specimens and should not be used as a sole basis for treatment. Nasal washings and aspirates are unacceptable for Xpert Xpress SARS-CoV-2/FLU/RSV testing.  Fact Sheet for Patients: BloggerCourse.com  Fact Sheet for Healthcare Providers: SeriousBroker.it  This test is not yet approved or cleared by the [place] FDA and has been authorized for detection and/or diagnosis of SARS-CoV-2 by FDA under an Emergency Use Authorization (EUA). This EUA will remain in effect (meaning this test can be used) for the duration of the COVID-19 declaration  under Section 564(b)(1) of the Act, 21 U.S.C. section 360bbb-3(b)(1), unless the authorization is terminated or revoked.     Resp Syncytial Virus by PCR NEGATIVE NEGATIVE Final    Comment: (NOTE) Fact Sheet for Patients: BloggerCourse.com  Fact Sheet for Healthcare Providers: SeriousBroker.it  This test is not yet approved or cleared by the [place] FDA and has been authorized for detection and/or diagnosis of SARS-CoV-2 by FDA under an Emergency Use Authorization (EUA). This EUA will remain in effect (meaning this test can be used) for the duration of the COVID-19 declaration under Section 564(b)(1) of the Act, 21 U.S.C. section 360bbb-3(b)(1), unless the authorization is terminated or revoked.  Performed at [facility], [address], [place], [place] [address]     Radiology Studies: CT CHEST ABDOMEN PELVIS W CONTRAST Result Date: 02/28/2024 CLINICAL DATA:  Brain metastases, unknown primary * Tracking Code: BO * EXAM: CT CHEST, ABDOMEN, AND PELVIS WITH CONTRAST TECHNIQUE: Multidetector CT imaging of the chest, abdomen and pelvis was performed following the standard protocol during  bolus administration of intravenous contrast. RADIATION DOSE REDUCTION: This exam was performed according to the departmental dose-optimization program which includes automated exposure control, adjustment of the mA and/or kV according to patient size and/or use of iterative reconstruction technique. CONTRAST:  75mL OMNIPAQUE IOHEXOL 350 MG/ML SOLN COMPARISON:  CT abdomen pelvis, 12/18/2023 FINDINGS: CT CHEST FINDINGS Cardiovascular: No significant extracardiac vascular findings. Normal heart size. Scattered left coronary artery calcifications no pericardial effusion. Mediastinum/Nodes: No enlarged mediastinal, hilar, or axillary lymph nodes. Thyroid gland, trachea, and esophagus demonstrate no significant findings. Lungs/Pleura: Minimal paraseptal  emphysema. Diffuse bilateral bronchial wall thickening. Background of fine centrilobular pulmonary nodules, most concentrated in the lung apices. Mild, bandlike bibasilar scarring. No pleural effusion or pneumothorax. Musculoskeletal: No chest wall abnormality. No acute osseous findings. CT ABDOMEN PELVIS FINDINGS Hepatobiliary: Hepatomegaly, maximum coronal span 20.4 cm. New hypodense lesion of the posterior liver dome, hepatic segment VII, measuring 1.5 x 0.1 cm (series 3, image 50). No gallstones, gallbladder wall thickening, or biliary dilatation. Pancreas: Unremarkable. No pancreatic ductal dilatation or surrounding inflammatory changes. Spleen: Normal in size without significant abnormality. Adrenals/Urinary Tract: Adrenal glands are unremarkable. Simple, benign renal cortical cysts, for which no further follow-up or characterization is required. Kidneys are otherwise normal, without obvious renal calculi, solid lesion, or hydronephrosis. Bladder is unremarkable. Stomach/Bowel: Stomach is within normal limits. Appendix not clearly visualized. No evidence of bowel wall thickening, distention, or inflammatory changes. Sigmoid diverticulosis. Vascular/Lymphatic: No significant vascular findings are present. No enlarged abdominal or pelvic lymph nodes. Reproductive: Interval enlargement of a soft tissue mass interposed between the rectum and prostate, measuring 6.5 x 4.1 cm, previously 4.6 x 2.7 cm when measured with similar technique (series 3, image 121). There is otherwise mild prostatomegaly. Other: Small fat containing bilateral inguinal hernias. Mild anasarca. No ascites. Musculoskeletal: No acute osseous findings. Disc degenerative disease and bridging osteophytosis throughout the thoracic and lumbar spine, in keeping with advanced DISH. IMPRESSION: 1. Interval enlargement of a soft tissue mass interposed between the rectum and prostate, measuring 6.5 x 4.1 cm, previously 4.6 x 2.7 cm when measured with  similar technique. Reportedly, patient carries a known diagnosis of prostate malignancy. 2. New hypodense lesion of the posterior liver dome, hepatic segment VII, measuring 1.5 x 1.1 cm, consistent with a small hepatic metastasis. 3. No other evidence of lymphadenopathy or metastatic disease in the chest, abdomen, or pelvis. 4. Emphysema and smoking-related respiratory bronchiolitis. 5. Coronary artery disease. Emphysema (ICD10-J43.9). Electronically Signed   By: Jearld Lesch M.D.   On: 02/28/2024 20:02      Huey Bienenstock MD [facility]  If 7PM-7AM, please contact night-coverage [email or web address] 02/29/2024, 2:19 PM

## 2024-03-01 DIAGNOSIS — G9340 Encephalopathy, unspecified: Secondary | ICD-10-CM | POA: Diagnosis not present

## 2024-03-01 DIAGNOSIS — C787 Secondary malignant neoplasm of liver and intrahepatic bile duct: Secondary | ICD-10-CM | POA: Diagnosis not present

## 2024-03-01 DIAGNOSIS — Z515 Encounter for palliative care: Secondary | ICD-10-CM

## 2024-03-01 DIAGNOSIS — D496 Neoplasm of unspecified behavior of brain: Secondary | ICD-10-CM | POA: Diagnosis not present

## 2024-03-01 LAB — BASIC METABOLIC PANEL
Anion gap: 6 (ref 5–15)
BUN: 29 mg/dL — ABNORMAL HIGH (ref 8–23)
CO2: 24 mmol/L (ref 22–32)
Calcium: 8.2 mg/dL — ABNORMAL LOW (ref 8.9–10.3)
Chloride: 106 mmol/L (ref 98–111)
Creatinine, Ser: 1.41 mg/dL — ABNORMAL HIGH (ref 0.61–1.24)
GFR, Estimated: 57 mL/min — ABNORMAL LOW (ref >60)
Glucose, Bld: 224 mg/dL — ABNORMAL HIGH (ref 70–99)
Potassium: 4.2 mmol/L (ref 3.5–5.1)
Sodium: 136 mmol/L (ref 135–145)

## 2024-03-01 LAB — CBC
HCT: 32.2 % — ABNORMAL LOW (ref 39.0–52.0)
Hemoglobin: 10.4 g/dL — ABNORMAL LOW (ref 13.0–17.0)
MCH: 25.1 pg — ABNORMAL LOW (ref 26.0–34.0)
MCHC: 32.3 g/dL (ref 30.0–36.0)
MCV: 77.6 fL — ABNORMAL LOW (ref 80.0–100.0)
Platelets: 378 10*3/uL (ref 150–400)
RBC: 4.15 MIL/uL — ABNORMAL LOW (ref 4.22–5.81)
RDW: 14.8 % (ref 11.5–15.5)
WBC: 10.8 10*3/uL — ABNORMAL HIGH (ref 4.0–10.5)
nRBC: 0 % (ref 0.0–0.2)

## 2024-03-01 LAB — GLUCOSE, CAPILLARY
Glucose-Capillary: 234 mg/dL — ABNORMAL HIGH (ref 70–99)
Glucose-Capillary: 361 mg/dL — ABNORMAL HIGH (ref 70–99)
Glucose-Capillary: 246 mg/dL — ABNORMAL HIGH (ref 70–99)
Glucose-Capillary: 305 mg/dL — ABNORMAL HIGH (ref 70–99)

## 2024-03-01 MED ORDER — LEVETIRACETAM 500 MG PO TABS
500.0000 mg | ORAL_TABLET | Freq: Two times a day (BID) | ORAL | Status: DC
  Administered 2024-03-01 – 2024-03-03 (×4): 500 mg via ORAL
  Filled 2024-03-01 (×4): qty 1

## 2024-03-01 NOTE — TOC Progression Note (Signed)
 Transition of Care [facility]) - Progression Note    Patient Details  Name: Thomas Donovan MRN: [id number] Date of Birth: [birth year]  [facility]) CM/SW Contact  Lockie Pares, RN Phone Number: 03/01/2024, 9:14 AM  Clinical Narrative:      Discussed case with provider, the patient elects to go home with hospice, [facility]  notified.       Expected Discharge Plan and Services    Home with hospice                                           Social Determinants of Health (SDOH) Interventions SDOH Screenings   Food Insecurity: No Food Insecurity (02/28/2024)  Housing: Low Risk  (02/28/2024)  Transportation Needs: No Transportation Needs (02/28/2024)  Utilities: Not At Risk (02/28/2024)  Financial Resource Strain: Medium Risk (01/05/2023)   [facility] from [facility]  Physical Activity: Inactive (10/04/2023)   Received from [facility]  Social Connections: Moderately Integrated (10/04/2023)   [facility]  Stress: Stress Concern Present (10/04/2023)   Received from [facility]  Tobacco Use: Medium Risk (02/28/2024)  Health Literacy: Medium Risk (10/04/2023)   Received from [facility]    Readmission Risk Interventions     No data to display

## 2024-03-01 NOTE — Plan of Care (Signed)
   Problem: Education: Goal: Ability to describe self-care measures that may prevent or decrease complications (Diabetes Survival Skills Education) will improve Outcome: Progressing

## 2024-03-01 NOTE — Progress Notes (Signed)
 [facility] Liaison Note  Received request from [place], Transitions of Care Manager, for hospice services at home after discharge. Spoke with patient to initiate education related to hospice philosophy, services, and team approach to care.   It is difficult to determine patient's level of understanding of our discussion and he is not able to discuss DME that he has or is needed at home in detail. He defers to his spouse for further discussion.   Attempted to contact spouse at all numbers noted in EMR. Unfortunately I was unable to reach her at this time.   TOC aware and will assist with communicating with spouse when she is able to reach her. Hospital liaisons will follow.   Thank you for the opportunity to participate in this patient's care.   Glenna Fellows BSN, Charity fundraiser, OCN [employer] [phone]

## 2024-03-01 NOTE — TOC Progression Note (Addendum)
 Transition of Care [facility]) - Progression Note    Patient Details  Name: Thomas Donovan MRN: [id number] Date of Birth: 12/13/1961  Transition of Care [facility]) CM/SW Contact  Lockie Pares, RN Phone Number: 03/01/2024, 10:11 AM  Clinical Narrative:     Attempting to get in touch with Next of Kin, wife Kaizer Dissinger. Jail gave [place] several numbers for wife all not working . Attempted internet  search- search let to several relatives Minerva Areola  [phone] rang could not leave a message, texted to please call back. Elven Laboy [phone] not working [phone] out of service [phone] did work, left message to call ASAP 1330 Met with patient at bedside, he is having trouble remembering, he did say his brother Daisy Mcneel llves in [place], he does not remember  his number. He also said something about another family in [place], darrell and Pam Ezra. Unable to find numbers on internet search for them. Tried to call Cristal Deer back, no mailbox set up, he has not responded to my text message .  1700 texted Cristal Deer, he replied back saying he does not know anyone who is in the hospital or [place] or [place]. Called [place] listed number hangs up right away before ringing. Texted this number ([phone]) and it was read but not replied to.  Will continue to look for family. The patient is not residential hospice yet. Wants to go home with hospice.  Expected Discharge Plan: Home w Hospice Care (versus residential) Barriers to Discharge: Continued Medical Work up  Expected Discharge Plan and Services   Discharge Planning Services: CM Consult   Living arrangements for the past 2 months: [facility]                           HH Arranged:  ([facility] hospice)           Social Determinants of Health (SDOH) Interventions SDOH Screenings   Food Insecurity: No Food Insecurity (02/28/2024)  Housing: Low Risk  (02/28/2024)  Transportation Needs:  No Transportation Needs (02/28/2024)  Utilities: Not At Risk (02/28/2024)  Financial Resource Strain: Medium Risk (01/05/2023)   [facility]  Physical Activity: Inactive (10/04/2023)   Received from [facility]  Social Connections: Moderately Integrated (10/04/2023)   Received from [facility]  Stress: Stress Concern Present (10/04/2023)   Received from [facility]  Tobacco Use: Medium Risk (02/28/2024)  Health Literacy: Medium Risk (10/04/2023)   Received from [facility]    Readmission Risk Interventions     No data to display

## 2024-03-01 NOTE — Progress Notes (Signed)
 PROGRESS NOTE  Thomas Donovan FTD:[id number] DOB: 08-30-1961   PCP: Pcp, No  Patient is from: [place]  DOA: 02/26/2024 LOS: 3  Chief complaints Chief Complaint  Patient presents with   Altered Mental Status     Brief Narrative / Interim history:  63 year old M with PMH of A-fib not on AC, IDDM-2, HTN, CAD, pelvic mass concerning for prostate cancer and questionable cirrhosis brought to [facility], ED from jail due to possible seizure/syncope.  Evaluation raise concern for stroke versus brain lesion and he was transferred to [facility] for further evaluation.  Patient was obtunded on arrival to [facility].  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  Neurosurgery consulted.  Patient was started on Decadron and Keppra.  PSA elevated to 176.  Per report from a nurse at the jail, patient only takes 2 medications including tamsulosin and Cardizem. -Further workup during hospital stay suspicious for primary prostate aggressive malignancy, with metastasis to liver and skull/brain/mandible, neurosurgery input appreciated, he is not a surgical candidate, recommendation for steroids for vasogenic edema, oncology input appreciated, mentation for palliative-comfort management.  Subjective:  No significant events overnight, patient was released from [facility] jail, patient remains confused, wants to go home, but cannot make decisions for himself.  Objective: Vitals:   03/01/24 0500 03/01/24 0600 03/01/24 0630 03/01/24 0800  BP:    130/79  Pulse: (!) 132 85  87 85  Resp: 18 18 19 18   Temp:      TempSrc:      SpO2: 95% 91% 92% 92%  Weight:      Height:        Examination:  Awake Alert, conversant, oriented x 1, impaired judgment and insight Symmetrical Chest wall movement, Good air movement bilaterally, CTAB RRR,No Gallops,Rubs or new Murmurs, No Parasternal Heave +ve B.Sounds, Abd Soft, No tenderness, No rebound - guarding or rigidity. 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  Has history of pelvic mass concerning for prostate cancer.  Had sigmoidoscopy with negative pathology in 10/2023.  He was to follow-up with urology for prostate biopsy at that time.  His PSA is elevated to 170s. -Neurosurgery input greatly appreciated. -CT abdomen pelvis significant for pelvic mass, liver metastasis as well -Oncology consulted -Palliative medicine consult -Neurology input greatly appreciated, continue Decadron and Keppra -See discussion below under goals of care.   Acute encephalopathy Seizures - likely due to brain mass with vasogenic edema.  -Neurology input greatly appreciated, continue with Keppra and Decadron.  History of PAF:  -On Cardizem drip initially, has been transitioned to as needed Cardizem, will change to short acting Cardizem scheduled  -Hold anticoagulation due to brain metastasis   Poorly controlled DM-2 with hyperglycemia: A1c 7.5% on 12/3. Recent Labs  Lab 02/29/24 1613 02/29/24 1806 02/29/24 2028 03/01/24  1610 03/01/24 1119  GLUCAP 388* 313* 280* 234* 361*  -Poorly controlled likely due to steroids, increased Lantus, sliding scale and Premeal NovoLog  History of liver cirrhosis:  CT raises concern for this.  No signs of ascites.  LFT within normal -Follow CT abdomen and pelvis  Essential hypertension: Normotensive -Continue with Cardizem  Normocytic anemia: Stable -Continue monitoring  Dysphagia: Likely due to #1 -Dysphagia 3 diet per SLP  Body mass index is 31.7 kg/m.        Goals of care discussion -Unfortunately it does appear patient with advanced, highly aggressive metastatic prostate cancer, with rapid progression to involve intracranial, and liver, this is not curable, cranial metastasis unresectable, cancer is incurable, I have discussed with wife yesterday, she understands, patient is DNR, and want to discuss hospice care, palliative medicine is involved, patient is currently DNR, awaiting hospice evaluation to arrange for home with hospice, but I cannot reach wife today despite multiple attempts.   DVT prophylaxis:  SCDs Start: 02/27/24 1224  Code Status: Full code Family Communication: None at bedside, have tried to reach wife today, Level of care: Progressive Status is: Inpatient Remains inpatient appropriate because: Intracranial mass, encephalopathy   Final disposition: To be determined Consultants:  Neurosurgery Palliative medicine Oncology     Sch Meds:  Scheduled Meds:  allopurinol  300 mg Oral Daily   dexamethasone (DECADRON) injection  4 mg Intravenous Q6H   diltiazem  30 mg Oral Q6H   insulin aspart  0-20 Units Subcutaneous TID WC   insulin aspart  0-5 Units Subcutaneous QHS   insulin aspart  4 Units Subcutaneous TID WC   insulin glargine  20 Units Subcutaneous BID   levETIRAcetam  500 mg Oral BID   pantoprazole  40 mg Oral Daily   QUEtiapine  25 mg Oral QHS   tamsulosin  0.4 mg Oral Daily   Continuous Infusions:   PRN Meds:.acetaminophen, haloperidol lactate, HYDROmorphone (DILAUDID) injection, LORazepam, ondansetron **OR** ondansetron (ZOFRAN) IV, oxyCODONE  Antimicrobials: Anti-infectives (From admission,  onward)    None        I have personally reviewed the following labs and images: CBC: Recent Labs  Lab 02/26/24 2013 02/28/24 0207 03/01/24 0551  WBC 12.0* 9.8 10.8*  HGB 11.2* 10.2* 10.4*  HCT 36.2* 32.3* 32.2*  MCV 81.0 79.2* 77.6*  PLT 437* 435* 378   BMP &GFR Recent Labs  Lab 02/26/24 2013 02/28/24 0207 03/01/24 0551  NA 138 132* 136  K 4.1 3.8 4.2  CL 102 102 106  CO2 24 20* 24  GLUCOSE 196* 341* 224*  BUN 13 26* 29*  CREATININE 1.40* 1.64* 1.41*  CALCIUM 9.5 8.7* 8.2*   Estimated Creatinine Clearance: 73.2 mL/min (A) (by C-G formula based on SCr of 1.41 mg/dL (H)). Liver & Pancreas: Recent Labs  Lab 02/28/24 0207  AST 14*  ALT 16  ALKPHOS 105  BILITOT 0.6  PROT 6.0*  ALBUMIN 2.5*   No results for input(s): "LIPASE", "AMYLASE" in the last 168 hours. Recent Labs  Lab 02/26/24 2013 02/28/24 2149  AMMONIA 13 15   Diabetic: No results for input(s): "HGBA1C" in the last 72 hours. Recent Labs  Lab 02/29/24 1613 02/29/24 1806 02/29/24 2028 03/01/24 0906 03/01/24 1119  GLUCAP 388* 313* 280* 234* 361*   Cardiac Enzymes: No results for input(s): "CKTOTAL", "CKMB", "CKMBINDEX", "TROPONINI" in the last 168 hours. No results for input(s): "PROBNP" in the last 8760 hours. Coagulation Profile: No results for input(s): "INR", "PROTIME" in the last 168  hours. Thyroid Function Tests: Recent Labs    02/28/24 2149  TSH 2.350   Lipid Profile: No results for input(s): "CHOL", "HDL", "LDLCALC", "TRIG", "CHOLHDL", "LDLDIRECT" in the last 72 hours. Anemia Panel: Recent Labs    02/28/24 2149  VITAMINB12 331   Urine analysis:    Component Value Date/Time   COLORURINE YELLOW 02/26/2024 2023   APPEARANCEUR HAZY (A) 02/26/2024 2023   LABSPEC 1.019 02/26/2024 2023   PHURINE 5.0 02/26/2024 2023   GLUCOSEU 50 (A) 02/26/2024 2023   HGBUR NEGATIVE 02/26/2024 2023   BILIRUBINUR NEGATIVE 02/26/2024 2023   KETONESUR NEGATIVE 02/26/2024 2023   PROTEINUR  100 (A) 02/26/2024 2023   UROBILINOGEN 0.2 01/01/2015 0912   NITRITE NEGATIVE 02/26/2024 2023   LEUKOCYTESUR NEGATIVE 02/26/2024 2023   Sepsis Labs: Invalid input(s): "PROCALCITONIN", "LACTICIDVEN"  Microbiology: Recent Results (from the past 240 hours)  Resp panel by RT-PCR (RSV, Flu A&B, Covid) Anterior Nasal Swab     Status: None   Collection Time: 02/26/24  7:44 PM   Specimen: Anterior Nasal Swab  Result Value Ref Range Status   SARS Coronavirus 2 by RT PCR NEGATIVE NEGATIVE Final    Comment: (NOTE) SARS-CoV-2 target nucleic acids are NOT DETECTED.  The SARS-CoV-2 RNA is generally detectable in upper respiratory specimens during the acute phase of infection. The lowest concentration of SARS-CoV-2 viral copies this assay can detect is 138 copies/mL. A negative result does not preclude SARS-Cov-2 infection and should not be used as the sole basis for treatment or other patient management decisions. A negative result may occur with  improper specimen collection/handling, submission of specimen other than nasopharyngeal swab, presence of viral mutation(s) within the areas targeted by this assay, and inadequate number of viral copies(<138 copies/mL). A negative result must be combined with clinical observations, patient history, and epidemiological information. The expected result is Negative.  Fact Sheet for Patients:  BloggerCourse.com  Fact Sheet for Healthcare Providers:  SeriousBroker.it  This test is no t yet approved or cleared by the [place] FDA and  has been authorized for detection and/or diagnosis of SARS-CoV-2 by FDA under an Emergency Use Authorization (EUA). This EUA will remain  in effect (meaning this test can be used) for the duration of the COVID-19 declaration under Section 564(b)(1) of the Act, 21 U.S.C.section 360bbb-3(b)(1), unless the authorization is terminated  or revoked sooner.        Influenza A by PCR NEGATIVE NEGATIVE Final   Influenza B by PCR NEGATIVE NEGATIVE Final    Comment: (NOTE) The Xpert Xpress SARS-CoV-2/FLU/RSV plus assay is intended as an aid in the diagnosis of influenza from Nasopharyngeal swab specimens and should not be used as a sole basis for treatment. Nasal washings and aspirates are unacceptable for Xpert Xpress SARS-CoV-2/FLU/RSV testing.  Fact Sheet for Patients: BloggerCourse.com  Fact Sheet for Healthcare Providers: SeriousBroker.it  This test is not yet approved or cleared by the [place] FDA and has been authorized for detection and/or diagnosis of SARS-CoV-2 by FDA under an Emergency Use Authorization (EUA). This EUA will remain in effect (meaning this test can be used) for the duration of the COVID-19 declaration under Section 564(b)(1) of the Act, 21 U.S.C. section 360bbb-3(b)(1), unless the authorization is terminated or revoked.     Resp Syncytial Virus by PCR NEGATIVE NEGATIVE Final    Comment: (NOTE) Fact Sheet for Patients: BloggerCourse.com  Fact Sheet for Healthcare Providers: SeriousBroker.it  This test is not yet approved or cleared by the [place] and  has been authorized for detection and/or diagnosis of SARS-CoV-2 by FDA under an Emergency Use Authorization (EUA). This EUA will remain in effect (meaning this test can be used) for the duration of the COVID-19 declaration under Section 564(b)(1) of the Act, 21 U.S.C. section 360bbb-3(b)(1), unless the authorization is terminated or revoked.  Performed at [facility], [address], [place], [place] [address]     Radiology Studies: No results found.     Huey Bienenstock MD [facility]  If 7PM-7AM, please contact night-coverage [email or web address] 03/01/2024, 1:07 PM

## 2024-03-01 NOTE — Care Management Important Message (Signed)
 Important Message  Patient Details  Name: Thomas Donovan MRN: [id number] Date of Birth: 01-03-1961   Important Message Given:  Yes - Medicare IM     Dorena Bodo 03/01/2024, 3:56 PM

## 2024-03-01 NOTE — Progress Notes (Signed)
 I have discussed situation with multiple partners and oncology.  Do not feel that surgical resection of his middle fossa mass would give him any significant improvement of his overall prognosis nor do I think that the surgery can be performed without significant risk of major neurologic deficit.  With this in mind we are all in agreement that patient should progress towards hospice care.

## 2024-03-01 NOTE — Inpatient Diabetes Management (Signed)
 Inpatient Diabetes Program Recommendations  AACE/ADA: New Consensus Statement on Inpatient Glycemic Control (2015)  Target Ranges:  Prepandial:   less than 140 mg/dL      Peak postprandial:   less than 180 mg/dL (1-2 hours)      Critically ill patients:  140 - 180 mg/dL   Lab Results  Component Value Date   GLUCAP 361 (H) 03/01/2024   HGBA1C 7.5 (H) 11/28/2023    Review of Glycemic Control  Latest Reference Range & Units 02/29/24 07:55 02/29/24 11:24 02/29/24 14:04 02/29/24 16:13 02/29/24 18:06 02/29/24 20:28 03/01/24 09:06 03/01/24 11:19  Glucose-Capillary 70 - 99 mg/dL 960 (H) 454 (H) 098 (H) 388 (H) 313 (H) 280 (H) 234 (H) 361 (H)   Diabetes history: DM 2 Outpatient Diabetes medications:  Glipizide and metformin in the past Current orders for Inpatient glycemic control:  Lantus 20 units bid Novolog 0-20 units tid + hs Novolog 4 units tid meal coverage  Decadron 4 mg Q6 hours  Inpatient Diabetes Program Recommendations:    -   Increase Novolog meal coverage to 8 units tid if eating >50% of meals -  Increase Lantus to 28 units bid   Thanks,  Christena Deem RN, MSN, BC-ADM Inpatient Diabetes Coordinator Team Pager [phone] (8a-5p)

## 2024-03-02 DIAGNOSIS — C61 Malignant neoplasm of prostate: Secondary | ICD-10-CM | POA: Diagnosis not present

## 2024-03-02 DIAGNOSIS — G9340 Encephalopathy, unspecified: Secondary | ICD-10-CM | POA: Diagnosis not present

## 2024-03-02 DIAGNOSIS — C7951 Secondary malignant neoplasm of bone: Secondary | ICD-10-CM | POA: Diagnosis not present

## 2024-03-02 DIAGNOSIS — C787 Secondary malignant neoplasm of liver and intrahepatic bile duct: Secondary | ICD-10-CM | POA: Diagnosis not present

## 2024-03-02 LAB — GLUCOSE, CAPILLARY
Glucose-Capillary: 310 mg/dL — ABNORMAL HIGH (ref 70–99)
Glucose-Capillary: 278 mg/dL — ABNORMAL HIGH (ref 70–99)
Glucose-Capillary: 421 mg/dL — ABNORMAL HIGH (ref 70–99)
Glucose-Capillary: 291 mg/dL — ABNORMAL HIGH (ref 70–99)

## 2024-03-02 MED ORDER — HYDROMORPHONE HCL 1 MG/ML IJ SOLN
1.0000 mg | INTRAMUSCULAR | Status: DC | PRN
  Administered 2024-03-02 – 2024-03-03 (×2): 1 mg via INTRAVENOUS
  Filled 2024-03-02 (×3): qty 1

## 2024-03-02 MED ORDER — DILTIAZEM HCL 60 MG PO TABS
60.0000 mg | ORAL_TABLET | Freq: Four times a day (QID) | ORAL | Status: DC
  Administered 2024-03-02 – 2024-03-03 (×4): 60 mg via ORAL
  Filled 2024-03-02 (×4): qty 1

## 2024-03-02 MED ORDER — INSULIN GLARGINE 100 UNIT/ML ~~LOC~~ SOLN
22.0000 [IU] | Freq: Two times a day (BID) | SUBCUTANEOUS | Status: DC
  Administered 2024-03-02 (×2): 22 [IU] via SUBCUTANEOUS
  Filled 2024-03-02 (×4): qty 0.22

## 2024-03-02 MED ORDER — HYDROMORPHONE HCL 1 MG/ML IJ SOLN
1.0000 mg | Freq: Once | INTRAMUSCULAR | Status: AC
  Administered 2024-03-02: 1 mg via INTRAVENOUS

## 2024-03-02 MED ORDER — PANTOPRAZOLE SODIUM 40 MG PO TBEC
40.0000 mg | DELAYED_RELEASE_TABLET | Freq: Two times a day (BID) | ORAL | Status: DC
  Administered 2024-03-02 – 2024-03-03 (×3): 40 mg via ORAL
  Filled 2024-03-02 (×3): qty 1

## 2024-03-02 NOTE — Progress Notes (Signed)
     Consult received. Chart reviewed. Discussed with Dr. Randol Kern. Patient with altered mental status and not able to participate in GOC discussion. Documented next of kin is his wife [name], however we cannot contact her until patient is released from custody by [facility].  PMT will follow-up tomorrow.    Sherlean Foot, NP-C Palliative Medicine   Please call Palliative Medicine team phone with any questions [phone]. For individual providers please see AMION.   No charge

## 2024-03-02 NOTE — Care Management (Addendum)
 No -one on the care team has heard from [facility], patents wife. Numerous calls have gone out without answer. [facility] Police department non- emergency line to do a well check. [employer] Officer of [employer] called back to say he checked the address [address] [place].  He spoke to the neighbor who stated that nobody has lived or been there since last April The officer stated it was very messy and junky. The neighbor thought [name] the patients wife , was in jail. Marland Kitchen

## 2024-03-02 NOTE — Progress Notes (Addendum)
 PROGRESS NOTE  Thomas Donovan EAV:[id number] DOB: 1961-12-01   PCP: Pcp, No  Patient is from: [place]  DOA: 02/26/2024 LOS: 4  Chief complaints Chief Complaint  Patient presents with   Altered Mental Status     Brief Narrative / Interim history:  63 year old M with PMH of A-fib not on AC, IDDM-2, HTN, CAD, pelvic mass concerning for prostate cancer and questionable cirrhosis brought to [facility] ED [facility] due to possible seizure/syncope.  Evaluation raise concern for stroke versus brain lesion and he was transferred to [facility] for further evaluation.  Patient was obtunded on arrival to [facility].  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  Neurosurgery consulted.  Patient was started on Decadron and Keppra.  PSA elevated to 176.  Per report from a nurse at the jail, patient only takes 2 medications including tamsulosin and Cardizem. -Further workup during hospital stay suspicious for primary prostate aggressive malignancy, with metastasis to liver and skull/brain/mandible, neurosurgery input appreciated, he is not a surgical candidate, recommendation for steroids for vasogenic edema, oncology input appreciated, recommendations for palliative-comfort management.  Subjective:  No significant events overnight, patient complains of pelvic pain, but no evidence of urinary retention.  Objective: Vitals:   03/02/24 0820 03/02/24 0821 03/02/24 0822 03/02/24 1300  BP:    129/81  Pulse: (!) 106 (!) 109 (!) 118 93  Resp: 16 18 15 18   Temp:    (!)  97.5 F (36.4 C)  TempSrc:    Oral  SpO2: 96% 95% 94% 94%  Weight:      Height:        Examination:  Awake Alert, conversant, oriented x 1, impaired judgment and insight Symmetrical Chest wall movement, Good air movement bilaterally, CTAB RRR,No Gallops,Rubs or new Murmurs, No Parasternal Heave +ve B.Sounds, Abd Soft, No tenderness, No rebound - guarding or rigidity. 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  Has history of pelvic mass concerning for prostate cancer.  Had sigmoidoscopy with negative pathology in 10/2023.  He was to follow-up with urology for prostate biopsy at that time.  His PSA is elevated to 170s. -Neurosurgery input greatly appreciated. -CT abdomen pelvis significant for pelvic mass, liver metastasis as well -Oncology consulted -Palliative medicine consult -Neurology input greatly appreciated, continue Decadron and Keppra -See discussion below under goals of care. -Patient complaining of pelvic pain, this is most likely due to malignancy, I will increase his IV morphine dosing.   Acute encephalopathy Seizures - likely due to brain mass with vasogenic edema.  -Neurology input greatly appreciated, continue with Keppra and Decadron.  History of PAF:  -On Cardizem drip initially, has been transitioned to as needed Cardizem, will change to short acting Cardizem scheduled  -Hold anticoagulation due to brain metastasis   Poorly controlled DM-2  with hyperglycemia: A1c 7.5% on 12/3. Recent Labs  Lab 03/01/24 1119 03/01/24 1623 03/01/24 2047 03/02/24 0849 03/02/24 1303  GLUCAP 361* 246* 305* 310* 278*  -Poorly controlled  likely due to steroids, increased Lantus, sliding scale and Premeal NovoLog  History of liver cirrhosis: CT raises concern for this.  No signs of ascites.  LFT within normal -Follow CT abdomen and pelvis  Essential hypertension: Normotensive -Continue with Cardizem  Normocytic anemia: Stable -Continue monitoring  Dysphagia: Likely due to #1 -Dysphagia 3 diet per SLP  Body mass index is 31.7 kg/m.        Goals of care discussion -Unfortunately it does appear patient with advanced, highly aggressive metastatic prostate cancer, with rapid progression to involve intracranial, and liver, this is not curable, cranial metastasis unresectable, cancer is incurable, I have discussed with wife yesterday, she understands, patient is DNR, and want to discuss hospice care, palliative medicine is involved, patient is currently DNR, awaiting hospice evaluation to arrange for home with hospice, multiple staff members tried to reach wife yesterday and today with no availability, I have left her another voicemail as well today.      DVT prophylaxis:  SCDs Start: 02/27/24 1224  Code Status: Full code Family Communication: None at bedside, have tried to reach wife today, Level of care: Progressive Status is: Inpatient Remains inpatient appropriate because: Intracranial mass, encephalopathy   Final disposition: To be determined Consultants:  Neurosurgery Palliative medicine Oncology     Sch Meds:  Scheduled Meds:  allopurinol  300 mg Oral Daily   dexamethasone (DECADRON) injection  4 mg Intravenous Q6H   diltiazem  60 mg Oral Q6H   insulin aspart  0-20 Units Subcutaneous TID WC   insulin aspart  0-5 Units Subcutaneous QHS   insulin aspart  4 Units Subcutaneous TID WC   insulin glargine  22 Units Subcutaneous BID   levETIRAcetam  500 mg Oral BID   pantoprazole  40 mg Oral BID   QUEtiapine  25 mg Oral QHS   tamsulosin  0.4 mg Oral Daily   Continuous Infusions:   PRN  Meds:.acetaminophen, haloperidol lactate, HYDROmorphone (DILAUDID) injection, LORazepam, ondansetron **OR** ondansetron (ZOFRAN) IV, oxyCODONE  Antimicrobials: Anti-infectives (From admission, onward)    None        I have personally reviewed the following labs and images: CBC: Recent Labs  Lab 02/26/24 2013 02/28/24 0207 03/01/24 0551  WBC 12.0* 9.8 10.8*  HGB 11.2* 10.2* 10.4*  HCT 36.2* 32.3* 32.2*  MCV 81.0 79.2* 77.6*  PLT 437* 435* 378   BMP &GFR Recent Labs  Lab 02/26/24 2013 02/28/24 0207 03/01/24 0551  NA 138 132* 136  K 4.1 3.8 4.2  CL 102 102 106  CO2 24 20* 24  GLUCOSE 196* 341* 224*  BUN 13 26* 29*  CREATININE 1.40* 1.64* 1.41*  CALCIUM 9.5 8.7* 8.2*   Estimated Creatinine Clearance: 73.2 mL/min (A) (by C-G formula based on SCr of 1.41 mg/dL (H)). Liver & Pancreas: Recent Labs  Lab 02/28/24 0207  AST 14*  ALT 16  ALKPHOS 105  BILITOT 0.6  PROT 6.0*  ALBUMIN 2.5*   No results for input(s): "LIPASE", "AMYLASE" in the last 168 hours. Recent Labs  Lab 02/26/24 2013 02/28/24 2149  AMMONIA 13 15   Diabetic: No results for input(s): "HGBA1C" in the last 72 hours. Recent Labs  Lab 03/01/24 1119 03/01/24 1623 03/01/24 2047 03/02/24 0849 03/02/24 1303  GLUCAP 361* 246* 305* 310* 278*   Cardiac Enzymes: No results  for input(s): "CKTOTAL", "CKMB", "CKMBINDEX", "TROPONINI" in the last 168 hours. No results for input(s): "PROBNP" in the last 8760 hours. Coagulation Profile: No results for input(s): "INR", "PROTIME" in the last 168 hours. Thyroid Function Tests: Recent Labs    02/28/24 2149  TSH 2.350   Lipid Profile: No results for input(s): "CHOL", "HDL", "LDLCALC", "TRIG", "CHOLHDL", "LDLDIRECT" in the last 72 hours. Anemia Panel: Recent Labs    02/28/24 2149  VITAMINB12 331   Urine analysis:    Component Value Date/Time   COLORURINE YELLOW 02/26/2024 2023   APPEARANCEUR HAZY (A) 02/26/2024 2023   LABSPEC 1.019 02/26/2024  2023   PHURINE 5.0 02/26/2024 2023   GLUCOSEU 50 (A) 02/26/2024 2023   HGBUR NEGATIVE 02/26/2024 2023   BILIRUBINUR NEGATIVE 02/26/2024 2023   KETONESUR NEGATIVE 02/26/2024 2023   PROTEINUR 100 (A) 02/26/2024 2023   UROBILINOGEN 0.2 01/01/2015 0912   NITRITE NEGATIVE 02/26/2024 2023   LEUKOCYTESUR NEGATIVE 02/26/2024 2023   Sepsis Labs: Invalid input(s): "PROCALCITONIN", "LACTICIDVEN"  Microbiology: Recent Results (from the past 240 hours)  Resp panel by RT-PCR (RSV, Flu A&B, Covid) Anterior Nasal Swab     Status: None   Collection Time: 02/26/24  7:44 PM   Specimen: Anterior Nasal Swab  Result Value Ref Range Status   SARS Coronavirus 2 by RT PCR NEGATIVE NEGATIVE Final    Comment: (NOTE) SARS-CoV-2 target nucleic acids are NOT DETECTED.  The SARS-CoV-2 RNA is generally detectable in upper respiratory specimens during the acute phase of infection. The lowest concentration of SARS-CoV-2 viral copies this assay can detect is 138 copies/mL. A negative result does not preclude SARS-Cov-2 infection and should not be used as the sole basis for treatment or other patient management decisions. A negative result may occur with  improper specimen collection/handling, submission of specimen other than nasopharyngeal swab, presence of viral mutation(s) within the areas targeted by this assay, and inadequate number of viral copies(<138 copies/mL). A negative result must be combined with clinical observations, patient history, and epidemiological information. The expected result is Negative.  Fact Sheet for Patients:  BloggerCourse.com  Fact Sheet for Healthcare Providers:  SeriousBroker.it  This test is no t yet approved or cleared by the [place] FDA and  has been authorized for detection and/or diagnosis of SARS-CoV-2 by FDA under an Emergency Use Authorization (EUA). This EUA will remain  in effect (meaning this test can be  used) for the duration of the COVID-19 declaration under Section 564(b)(1) of the Act, 21 U.S.C.section 360bbb-3(b)(1), unless the authorization is terminated  or revoked sooner.       Influenza A by PCR NEGATIVE NEGATIVE Final   Influenza B by PCR NEGATIVE NEGATIVE Final    Comment: (NOTE) The Xpert Xpress SARS-CoV-2/FLU/RSV plus assay is intended as an aid in the diagnosis of influenza from Nasopharyngeal swab specimens and should not be used as a sole basis for treatment. Nasal washings and aspirates are unacceptable for Xpert Xpress SARS-CoV-2/FLU/RSV testing.  Fact Sheet for Patients: BloggerCourse.com  Fact Sheet for Healthcare Providers: SeriousBroker.it  This test is not yet approved or cleared by the [place] FDA and has been authorized for detection and/or diagnosis of SARS-CoV-2 by FDA under an Emergency Use Authorization (EUA). This EUA will remain in effect (meaning this test can be used) for the duration of the COVID-19 declaration under Section 564(b)(1) of the Act, 21 U.S.C. section 360bbb-3(b)(1), unless the authorization is terminated or revoked.     Resp Syncytial Virus by PCR NEGATIVE NEGATIVE  Final    Comment: (NOTE) Fact Sheet for Patients: BloggerCourse.com  Fact Sheet for Healthcare Providers: SeriousBroker.it  This test is not yet approved or cleared by the [place] FDA and has been authorized for detection and/or diagnosis of SARS-CoV-2 by FDA under an Emergency Use Authorization (EUA). This EUA will remain in effect (meaning this test can be used) for the duration of the COVID-19 declaration under Section 564(b)(1) of the Act, 21 U.S.C. section 360bbb-3(b)(1), unless the authorization is terminated or revoked.  Performed at [facility], [address], [place], [place] [address]     Radiology Studies: No results  found.     Huey Bienenstock MD [facility]  If 7PM-7AM, please contact night-coverage [email or web address] 03/02/2024, 2:15 PM

## 2024-03-02 NOTE — Plan of Care (Signed)

## 2024-03-02 NOTE — Progress Notes (Signed)
 Contacted pt's wife at [phone], per automated voice, the number is not in service. Per Boyd Kerbs, RN, she hasn't seen the wife. Pt reports that he has been trying to call her but she is not responding. Contacted Nicole with [employer]. She reports that they have been trying to contact the wife, but no contact has been made. Will continue to f/u to assist with the DC plan.

## 2024-03-03 DIAGNOSIS — D496 Neoplasm of unspecified behavior of brain: Secondary | ICD-10-CM | POA: Diagnosis not present

## 2024-03-03 LAB — GLUCOSE, CAPILLARY
Glucose-Capillary: 319 mg/dL — ABNORMAL HIGH (ref 70–99)
Glucose-Capillary: 439 mg/dL — ABNORMAL HIGH (ref 70–99)
Glucose-Capillary: 458 mg/dL — ABNORMAL HIGH (ref 70–99)

## 2024-03-03 MED ORDER — FINASTERIDE 5 MG PO TABS: 5.0000 mg | ORAL_TABLET | Freq: Every day | ORAL | 0 refills | Status: DC

## 2024-03-03 MED ORDER — GLIPIZIDE 10 MG PO TABS: 10.0000 mg | ORAL_TABLET | Freq: Two times a day (BID) | ORAL | 0 refills | Status: DC

## 2024-03-03 MED ORDER — LEVETIRACETAM 500 MG PO TABS: 500.0000 mg | ORAL_TABLET | Freq: Two times a day (BID) | ORAL | 0 refills | Status: DC

## 2024-03-03 MED ORDER — QUETIAPINE FUMARATE 25 MG PO TABS: 25.0000 mg | ORAL_TABLET | Freq: Every day | ORAL | 0 refills | Status: DC

## 2024-03-03 MED ORDER — OXYCODONE HCL 10 MG PO TABS: 10.0000 mg | ORAL_TABLET | Freq: Four times a day (QID) | ORAL | 0 refills | Status: DC | PRN

## 2024-03-03 MED ORDER — OXYCODONE HCL 10 MG PO TABS: 10.0000 mg | ORAL_TABLET | Freq: Four times a day (QID) | ORAL | 0 refills | Status: AC | PRN

## 2024-03-03 MED ORDER — INSULIN GLARGINE 100 UNIT/ML ~~LOC~~ SOLN
25.0000 [IU] | Freq: Two times a day (BID) | SUBCUTANEOUS | Status: DC
  Administered 2024-03-03: 25 [IU] via SUBCUTANEOUS
  Filled 2024-03-03 (×2): qty 0.25

## 2024-03-03 MED ORDER — PANTOPRAZOLE SODIUM 40 MG PO TBEC: 40.0000 mg | DELAYED_RELEASE_TABLET | Freq: Two times a day (BID) | ORAL | 0 refills | Status: DC

## 2024-03-03 MED ORDER — ONDANSETRON HCL 4 MG PO TABS: 4.0000 mg | ORAL_TABLET | Freq: Four times a day (QID) | ORAL | 0 refills | Status: DC | PRN

## 2024-03-03 MED ORDER — DILTIAZEM HCL ER COATED BEADS 180 MG PO CP24: 180.0000 mg | ORAL_CAPSULE | Freq: Every day | ORAL | 0 refills | Status: DC

## 2024-03-03 MED ORDER — OXYCODONE HCL 5 MG PO TABS
5.0000 mg | ORAL_TABLET | ORAL | Status: DC | PRN
  Administered 2024-03-03: 5 mg via ORAL
  Filled 2024-03-03: qty 1

## 2024-03-03 MED ORDER — TAMSULOSIN HCL 0.4 MG PO CAPS: 0.4000 mg | ORAL_CAPSULE | Freq: Every day | ORAL | 0 refills | Status: DC

## 2024-03-03 MED ORDER — DEXAMETHASONE 6 MG PO TABS: 6.0000 mg | ORAL_TABLET | Freq: Two times a day (BID) | ORAL | 0 refills | Status: DC

## 2024-03-03 MED ORDER — INSULIN ASPART 100 UNIT/ML IJ SOLN
6.0000 [IU] | Freq: Three times a day (TID) | INTRAMUSCULAR | Status: DC
  Administered 2024-03-03 (×2): 6 [IU] via SUBCUTANEOUS

## 2024-03-03 MED ORDER — LORAZEPAM 1 MG PO TABS: 1.0000 mg | ORAL_TABLET | Freq: Four times a day (QID) | ORAL | 0 refills | Status: DC | PRN

## 2024-03-03 MED ORDER — INSULIN GLARGINE 100 UNIT/ML SOLOSTAR PEN
25.0000 [IU] | PEN_INJECTOR | Freq: Every day | SUBCUTANEOUS | 0 refills | Status: DC
Start: 2024-03-03 — End: 2024-03-03

## 2024-03-03 MED ORDER — INSULIN GLARGINE 100 UNIT/ML SOLOSTAR PEN: 25.0000 [IU] | PEN_INJECTOR | Freq: Every day | SUBCUTANEOUS | 0 refills | Status: DC

## 2024-03-03 NOTE — Progress Notes (Signed)
     Brief progress note:   Chart reviewed. Complicated social situation with wife (see TOC notes). Discussed with Dr. Randol Kern. He spoke with patient's brother today. Plan is for patient to discharge to brother's house with hospice services. [facility] is already following.    Sherlean Foot, NP-C Palliative Medicine   Please call Palliative Medicine team phone with any questions [phone]. For individual providers please see AMION.   No charge

## 2024-03-03 NOTE — Progress Notes (Addendum)
 Per Desiray, RN, family is here, pt is supposed to be DC today with hospice. Notified Nicole with hospice. Met with pt, his brother Leonette Most) and brother's wife Chiropractor). Rachael reports that pt's wis wife called them and informed them that pt was in the hospital. She called the hospital to find out the room number, but she was not able to get any information. Pt is admitted as confidential. She reports that pt's wife is a drug addict and they don't know where she is. She reports that pt's only son died last year.   Rachael reports that they are taking pt home with them and they are providing 24 hr care. Discussed DME. She reports they have an adjustable bed and a walker. Pt needs a 3-in-1 BSC and a wheelchair. Notified Joni Reining of DME.   Charles phone # [phone] Rachael phone # [phone] - Rachael asked to call her first  Brother's address:  [address] [place], [place] [address]

## 2024-03-03 NOTE — Plan of Care (Signed)

## 2024-03-03 NOTE — Discharge Summary (Signed)
 Physician Discharge Summary  YITZCHAK KOTHARI NWG:[id number] DOB: 1961/06/18 DOA: 02/26/2024  PCP: Pcp, No  Admit date: 02/26/2024 Discharge date: 03/03/2024  Admitted From: (Incarceration facility in [place]) Disposition: Home with hospice  Recommendations for Outpatient Follow-up:  Management per hospice  Code status: DNR, comfort-hospice  Diet recommendation: Regular diet(patient remains with significant hyperglycemia secondary to steroids, but overall he is at end-of-life, and enjoys eating)  Brief/Interim Summary: 63 year old M with PMH of A-fib not on AC, IDDM-2, HTN, CAD, pelvic mass concerning for prostate cancer and questionable cirrhosis brought [facility] ED [facility] due to possible seizure/syncope.  Evaluation raise concern for stroke versus brain lesion and he was transferred to [facility] for further evaluation.  Patient was obtunded on arrival to [facility].  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  Neurosurgery consulted.  Patient was started on Decadron and Keppra.  PSA elevated to 176.   Per report from a nurse at the jail, patient only takes 2 medications including tamsulosin and Cardizem. -Further workup during hospital stay suspicious for primary prostate aggressive malignancy, with metastasis to liver and skull/brain/mandible, neurosurgery input appreciated, he is not a surgical candidate, recommendation for steroids for vasogenic edema, oncology input appreciated, recommendations for palliative-comfort management.   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  Has history of pelvic mass concerning for prostate cancer.  Had sigmoidoscopy with negative pathology in 10/2023.  He was to follow-up with urology for prostate biopsy at that time.  His PSA is elevated to 170s. -Neurosurgery input greatly appreciated. -CT abdomen pelvis significant for pelvic mass, liver metastasis as well -Oncology consulted -Palliative medicine consult -Neurology input greatly appreciated, continue Decadron and Keppra -See discussion below under goals of care. -Patient complaining of pelvic pain, this is most likely due to malignancy, his pain medicine has been adjusted, he is comfortable at time of discharge    Acute metabolic encephalopathy Seizures Brain mets with vasogenic edema - likely due to brain mass with vasogenic edema.  -Neurology input greatly appreciated, continue with Keppra and Decadron.   History of PAF:  -On Cardizem drip initially, has been transitioned to as needed Cardizem, will change to Cardizem CD -Hold anticoagulation due to brain metastasis   Poorly controlled DM-2 with hyperglycemia: A1c 7.5% on 12/3. -Poorly controlled likely due to steroids, this dose has been adjusted frequently, as well patient with dietary indiscretion, he is at end-of-life, will not aim for tight control.     History of liver cirrhosis: CT raises concern for this.  No signs of ascites.  LFT within normal   Essential hypertension:  -Continue with Cardizem   Normocytic anemia:     Dysphagia:    Obesity class I Body mass index is 31.7 kg/m.      Goals of care discussion -Unfortunately it does appear patient with advanced, highly aggressive metastatic prostate cancer, with rapid progression to involve intracranial, and liver, this is not curable, cranial metastasis  unresectable, cancer is incurable, I have discussed this with wife, brother, and sister-in-law this point our main focus is comfort, hospice has been consulted, patient will be discharged home with home hospice at his brother's home as he is having support system provided by brother, brothers wife and multiple family members, hospice has been arranged at time of discharge, medication prescription has been dispensed, including as needed morphine and Ativan with ultimate goal of comfort,  Discharge Diagnoses:  Principal Problem:   Brain tumor [facility]) Active Problems:   Atrial fibrillation, chronic (HCC)   Chronic anticoagulation   OSA on CPAP   Acute encephalopathy   Brain mass   Cancer, metastatic to liver [facility])   Prostate cancer metastatic to bone [facility])    Discharge Instructions  Discharge Instructions     Diet - low sodium heart healthy   Complete by: As directed    Discharge instructions   Complete by: As directed    per hospice   Increase activity slowly   Complete by: As directed       Allergies as of 03/03/2024       Reactions   Celebrex [celecoxib] Anaphylaxis   Guaifenesin Anaphylaxis   Ibuprofen Hives, Other (See Comments)   Reaction:  GI bleeding OTC NSAIDS CAUSE BLOOD IN STOOLS   Tramadol Hives, Rash   Acetaminophen Other (See Comments)   Liver issues   Aspirin Other (See Comments)   Pt was told not to take by MD due to being on Colchicine.   Ketorolac Hives   Blisters between fingers   Toradol [ketorolac Tromethamine] Other (See Comments)   Blisters between fingers         Medication List     STOP taking these medications    albuterol 108 (90 Base) MCG/ACT inhaler Commonly known as: VENTOLIN HFA   allopurinol 300 MG tablet Commonly known as: ZYLOPRIM   atorvastatin 40 MG tablet Commonly known as: LIPITOR   colchicine 0.6 MG tablet   Eliquis 5 MG Tabs tablet Generic drug: apixaban   esomeprazole 40 MG capsule Commonly known as:  NEXIUM Replaced by: pantoprazole 40 MG tablet   famotidine 20 MG tablet Commonly known as: PEPCID   fenofibrate micronized 134 MG capsule Commonly known as: LOFIBRA   folic acid 1 MG tablet Commonly known as: FOLVITE   furosemide 40 MG tablet Commonly known as: LASIX   HYDROcodone-acetaminophen 5-325 MG tablet Commonly known as: NORCO/VICODIN   levofloxacin 500 MG tablet Commonly known as: LEVAQUIN   metFORMIN 850 MG tablet Commonly known as: GLUCOPHAGE   nitrofurantoin (macrocrystal-monohydrate) 100 MG capsule Commonly known as: MACROBID   phenazopyridine 200 MG tablet Commonly known as: PYRIDIUM   promethazine 25 MG tablet Commonly known as: PHENERGAN   thiamine 100 MG tablet Commonly known as: Vitamin B-1   Trelegy Ellipta 100-62.5-25 MCG/ACT Aepb Generic drug: Fluticasone-Umeclidin-Vilant       TAKE these medications    dexamethasone 6 MG tablet Commonly known as: DECADRON Take 1 tablet (6 mg total) by mouth 2 (two) times daily. Start taking on: March 04, 2024   diltiazem 180 MG 24 hr capsule Commonly known as: CARDIZEM CD Take 1 capsule (180 mg total) by mouth daily.   finasteride 5 MG tablet Commonly known as: PROSCAR Take 1 tablet (5 mg total) by mouth daily.   glipiZIDE 10 MG tablet Commonly known as: GLUCOTROL Take 1 tablet (10 mg total) by mouth 2 (two) times daily before a meal. What changed:  how much to take  when to take this   insulin glargine 100 UNIT/ML Solostar Pen Commonly known as: LANTUS Inject 25 Units into the skin daily.   levETIRAcetam 500 MG tablet Commonly known as: KEPPRA Take 1 tablet (500 mg total) by mouth 2 (two) times daily.   LORazepam 1 MG tablet Commonly known as: Ativan Take 1 tablet (1 mg total) by mouth every 6 (six) hours as needed for anxiety or seizure.   ondansetron 4 MG tablet Commonly known as: ZOFRAN Take 1 tablet (4 mg total) by mouth every 6 (six) hours as needed for nausea.   Oxycodone HCl  10 MG Tabs Take 1 tablet (10 mg total) by mouth every 6 (six) hours as needed for up to 10 days (pain,). Under Hospice care   pantoprazole 40 MG tablet Commonly known as: PROTONIX Take 1 tablet (40 mg total) by mouth 2 (two) times daily. Replaces: esomeprazole 40 MG capsule   QUEtiapine 25 MG tablet Commonly known as: SEROQUEL Take 1 tablet (25 mg total) by mouth at bedtime.   tamsulosin 0.4 MG Caps capsule Commonly known as: FLOMAX Take 1 capsule (0.4 mg total) by mouth daily.        Follow-up Information     [facility]. Call.   Specialty: Hospice and Palliative Medicine Why: [facility] will provide hospice services at home. Contact information: [address] [facility] [place] [address] [phone]               Allergies  Allergen Reactions   Celebrex [Celecoxib] Anaphylaxis   Guaifenesin Anaphylaxis   Ibuprofen Hives and Other (See Comments)    Reaction:  GI bleeding OTC NSAIDS CAUSE BLOOD IN STOOLS   Tramadol Hives and Rash   Acetaminophen Other (See Comments)    Liver issues   Aspirin Other (See Comments)    Pt was told not to take by MD due to being on Colchicine.   Ketorolac Hives    Blisters between fingers   Toradol [Ketorolac Tromethamine] Other (See Comments)    Blisters between fingers     Consultations: Palliative medicine Oncology Neurology  neurosurgery    Procedures/Studies: CT CHEST ABDOMEN PELVIS W CONTRAST Result Date: 02/28/2024 CLINICAL DATA:  Brain metastases, unknown primary * Tracking Code: BO * EXAM: CT CHEST, ABDOMEN, AND PELVIS WITH CONTRAST TECHNIQUE: Multidetector CT imaging of the chest, abdomen and pelvis was performed following the standard protocol during bolus administration of intravenous contrast. RADIATION DOSE REDUCTION: This exam was performed according to the departmental dose-optimization program which includes automated exposure control, adjustment of the mA and/or kV according to  patient size and/or use of iterative reconstruction technique. CONTRAST:  75mL OMNIPAQUE IOHEXOL 350 MG/ML SOLN COMPARISON:  CT abdomen pelvis, 12/18/2023 FINDINGS: CT CHEST FINDINGS Cardiovascular: No significant extracardiac vascular findings. Normal heart size. Scattered left coronary artery calcifications no pericardial effusion. Mediastinum/Nodes: No enlarged mediastinal, hilar, or axillary lymph nodes. Thyroid gland, trachea, and esophagus demonstrate no significant findings. Lungs/Pleura: Minimal paraseptal emphysema. Diffuse bilateral bronchial wall thickening. Background of fine centrilobular pulmonary nodules, most concentrated in the lung apices. Mild, bandlike bibasilar scarring. No pleural effusion or pneumothorax. Musculoskeletal: No chest wall abnormality. No acute osseous findings. CT ABDOMEN PELVIS FINDINGS Hepatobiliary: Hepatomegaly, maximum coronal span 20.4 cm. New hypodense lesion of the posterior liver dome, hepatic segment VII, measuring 1.5 x 0.1 cm (series 3, image 50). No gallstones, gallbladder wall thickening, or biliary dilatation. Pancreas: Unremarkable. No pancreatic ductal dilatation or surrounding inflammatory changes. Spleen: Normal in size without significant abnormality. Adrenals/Urinary  Tract: Adrenal glands are unremarkable. Simple, benign renal cortical cysts, for which no further follow-up or characterization is required. Kidneys are otherwise normal, without obvious renal calculi, solid lesion, or hydronephrosis. Bladder is unremarkable. Stomach/Bowel: Stomach is within normal limits. Appendix not clearly visualized. No evidence of bowel wall thickening, distention, or inflammatory changes. Sigmoid diverticulosis. Vascular/Lymphatic: No significant vascular findings are present. No enlarged abdominal or pelvic lymph nodes. Reproductive: Interval enlargement of a soft tissue mass interposed between the rectum and prostate, measuring 6.5 x 4.1 cm, previously 4.6 x 2.7 cm when  measured with similar technique (series 3, image 121). There is otherwise mild prostatomegaly. Other: Small fat containing bilateral inguinal hernias. Mild anasarca. No ascites. Musculoskeletal: No acute osseous findings. Disc degenerative disease and bridging osteophytosis throughout the thoracic and lumbar spine, in keeping with advanced DISH. IMPRESSION: 1. Interval enlargement of a soft tissue mass interposed between the rectum and prostate, measuring 6.5 x 4.1 cm, previously 4.6 x 2.7 cm when measured with similar technique. Reportedly, patient carries a known diagnosis of prostate malignancy. 2. New hypodense lesion of the posterior liver dome, hepatic segment VII, measuring 1.5 x 1.1 cm, consistent with a small hepatic metastasis. 3. No other evidence of lymphadenopathy or metastatic disease in the chest, abdomen, or pelvis. 4. Emphysema and smoking-related respiratory bronchiolitis. 5. Coronary artery disease. Emphysema (ICD10-J43.9). Electronically Signed   By: Jearld Lesch M.D.   On: 02/28/2024 20:02   EEG adult Result Date: 02/27/2024 Charlsie Quest, MD     02/27/2024 10:30 AM Patient Name: CHIA ROCK MRN: [id number] Epilepsy Attending: Charlsie Quest Referring Physician/Provider: Caryl Pina, MD Date: 02/27/2024 Duration: 27.35 mins Patient history: 63yo M with ams. EEG to evaluate for seizure Level of alertness: Asleep AEDs during EEG study: LEV, Dexamethasone, Ativan Technical aspects: This EEG study was done with scalp electrodes positioned according to the 10-20 International system of electrode placement. Electrical activity was reviewed with band pass filter of 1-70Hz , sensitivity of 7 uV/mm, display speed of 94mm/sec with a 60Hz  notched filter applied as appropriate. EEG data were recorded continuously and digitally stored.  Video monitoring was available and reviewed as appropriate. Description: Sleep was characterized by sleep spindles (12 to 14 Hz), maximal frontocentral region.  Hyperventilation and photic stimulation were not performed.   IMPRESSION: This study during sleep only is within normal limits. No seizures or epileptiform discharges were seen throughout the recording.  If suspicion for ictal- interictal activity remains a concern, a prolonged study can be considered. Charlsie Quest   MR BRAIN W WO CONTRAST Result Date: 02/27/2024 CLINICAL DATA:  63 year old male with altered mental status. Unwitnessed fall. Left MCA infarct versus tumor on head CT/CTA. EXAM: MRI HEAD WITHOUT AND WITH CONTRAST TECHNIQUE: Multiplanar, multiecho pulse sequences of the brain and surrounding structures were obtained without and with intravenous contrast. CONTRAST:  10mL GADAVIST GADOBUTROL 1 MMOL/ML IV SOLN COMPARISON:  Head CT yesterday, CTA head and neck 0047 hours today. FINDINGS: Brain: Large round and lobulated, intensely enhancing left middle cranial fossa mass. The enhancing component encompasses 42 x 45 x 41 mm and is inseparable from the left cavernous sinus (series 8, image 21). This was largely isodense by CT. Extensive tumoral edema in the left hemisphere with mostly a vasogenic pattern including tracking into all deep white matter capsules, the deep gray nuclei. Along the posterosuperior margin of the dominant enhancing component is a confluent area of hemosiderin and wagon-wheel-spoke type appearance on precontrast T2, which is partially enhancing postcontrast (  series 4, image 26). Initial coronal T2 weighted images at 0317 hours today were motion degraded but repeat coronal T2 at 0853 hours, series 6, images 15 and 16 suggest a CSF gap between the enhancing component of the mass and adjacent left temporal lobe. And furthermore, there is strong evidence of tumor invasion through the bone of the left middle cranial fossa, into the left infratemporal fossa with solid enhancing tumor there (series 3, image 19 versus series 9, image 17) and also into the posterior left orbit (series 4,  image 22). Sclerotic and mildly hyperostotic bone in these areas by CT. And there is abnormal signal in the left pterygoid and temporalis muscles. Therefore, all told the bulky left middle cranial fossa tumor is at least 62 x 56 by 69 mm (AP by transverse by CC). Furthermore there is an expansile tumor of the right mandible ramus and condyle (series 3 image 4 and series 9, image 4 estimated at 34 mm diameter and abnormally enhancing. Right mandible condyle permeated and partially destroyed by CT. Associated intracranial mass effect with rightward midline shift of 10 mm and subtotal effacement of the left lateral ventricle. No ventriculomegaly. Basilar cisterns remain patent. No restricted diffusion suggestive of infarction. No other cerebral edema identified. No other cerebral blood products identified. Vascular: Major intracranial vascular flow voids are preserved. There is mass effect on the left ICA terminus, left MCA branches. Skull and upper cervical spine: Background bone marrow signal is within normal limits. Grossly negative visible cervical spine. Sinuses/Orbits: Infiltrative tumor in the posterior left orbit, along the left lateral wall of that orbit. Underlying abnormal bone. No significant exophthalmos. No other abnormal intraorbital mass or enhancement is evident. But there is also tumor infiltration of the left sphenoid and maxillary sinuses, also confirmed by CT. Other: Mastoids remain clear. Grossly negative visible internal auditory structures. IMPRESSION: 1. Motion degraded exam. 2. 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 Estimated tumor long-axis nearly 7 cm. Confluent enhancing intracranial component is 4-5 cm. AND a second similar bony destructive and expansile enhancing Tumor of the Right Mandible Condyle is  3-4 cm. 3. Multiplicity and constellation of imaging findings favors Metastases over other considerations such as invasive Meningioma, high-grade glioma, or Lymphoma. 4. Extensive probably vasogenic tumoral edema in the left cerebral hemisphere with intracranial mass effect including rightward midline shift of 10 mm. Basilar cisterns remain patent. No evidence of acute cerebral infarct. Electronically Signed   By: Odessa Fleming M.D.   On: 02/27/2024 09:45   DG Femur Portable 1 View Right Result Date: 02/27/2024 CLINICAL DATA:  MRI clearance EXAM: RIGHT FEMUR PORTABLE 1 VIEW COMPARISON:  None Available. FINDINGS: There is no evidence of fracture or other focal bone lesions. Soft tissues are unremarkable. No radiopaque foreign body is noted. IMPRESSION: No acute abnormality noted. Electronically Signed   By: Alcide Clever M.D.   On: 02/27/2024 03:41   DG Femur Portable 1 View Left Result Date: 02/27/2024 CLINICAL DATA:  MRI clearance EXAM: LEFT FEMUR PORTABLE 1 VIEW COMPARISON:  None Available. FINDINGS: There is no evidence of fracture or other focal bone lesions. Soft tissues are unremarkable. No foreign body seen. IMPRESSION: No acute abnormality noted. Electronically Signed   By: Alcide Clever M.D.   On: 02/27/2024 03:41   DG Humerus Right Result Date: 02/27/2024 CLINICAL DATA:  MRI screening EXAM:  RIGHT HUMERUS - 2+ VIEW COMPARISON:  None Available. FINDINGS: No acute fracture is noted. A needle is noted overlying the right arm likely extrinsic to the patient. No other focal abnormality is noted. IMPRESSION: Needle is noted over the mid upper arm likely extrinsic to the patient. Correlate with the physical exam. Electronically Signed   By: Alcide Clever M.D.   On: 02/27/2024 03:41   DG Humerus Left Result Date: 02/27/2024 CLINICAL DATA:  Screening for MRI EXAM: LEFT HUMERUS - 2+ VIEW COMPARISON:  None Available. FINDINGS: No acute fractures noted. No radiopaque foreign body is noted. Degenerative changes of the  acromioclavicular joint are seen. IMPRESSION: No foreign body noted. Electronically Signed   By: Alcide Clever M.D.   On: 02/27/2024 03:39   CT ANGIO HEAD NECK W WO CM Result Date: 02/27/2024 CLINICAL DATA:  Stroke, follow up EXAM: CT ANGIOGRAPHY HEAD AND NECK WITH AND WITHOUT CONTRAST TECHNIQUE: Multidetector CT imaging of the head and neck was performed using the standard protocol during bolus administration of intravenous contrast. Multiplanar CT image reconstructions and MIPs were obtained to evaluate the vascular anatomy. Carotid stenosis measurements (when applicable) are obtained utilizing NASCET criteria, using the distal internal carotid diameter as the denominator. RADIATION DOSE REDUCTION: This exam was performed according to the departmental dose-optimization program which includes automated exposure control, adjustment of the mA and/or kV according to patient size and/or use of iterative reconstruction technique. CONTRAST:  75mL OMNIPAQUE IOHEXOL 350 MG/ML SOLN COMPARISON:  None Available. FINDINGS: CTA NECK FINDINGS Aortic arch: Great vessel origins are patent without significant stenosis. Right carotid system: No evidence of dissection, stenosis (50% or greater), or occlusion. Left carotid system: No evidence of dissection, stenosis (50% or greater), or occlusion. Vertebral arteries: Left dominant. No evidence of dissection, stenosis (50% or greater), or occlusion. Skeleton: No acute abnormality on limited assessment. Other neck: No acute abnormality on limited assessment. Upper chest: Visualized lung apices are clear. 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 Review of the MIP images confirms the above findings CTA HEAD FINDINGS Anterior circulation: No significant stenosis, proximal occlusion, aneurysm, or vascular malformation. Posterior circulation: No  significant stenosis, proximal occlusion, aneurysm, or vascular malformation. Venous sinuses: As permitted by contrast timing, patent. Review of the MIP images confirms the above findings IMPRESSION: 1. Although incompletely assessed on this study, suspected enhancing mass in the anterior left temporal lobe which likely is the cause of the patient's extensive left cerebral edema. Recommend MRI of the head with contrast to confirm and further evaluate. 2. No large vessel occlusion or proximal hemodynamically significant stenosis. Findings discussed with Dr. Blinda Leatherwood via telephone at 2:44 a.m. Electronically Signed   By: Feliberto Harts M.D.   On: 02/27/2024 02:45   DG Chest Port 1 View Result Date: 02/26/2024 CLINICAL DATA:  Altered mental status. EXAM: PORTABLE CHEST 1 VIEW COMPARISON:  January 24, 2024 FINDINGS: The heart size and mediastinal contours are within normal limits. Both lungs are clear. Chronic and degenerative changes are seen involving the right shoulder. The visualized skeletal structures are otherwise unremarkable. IMPRESSION: No active disease. Electronically Signed   By: Aram Candela M.D.   On: 02/26/2024 23:05   CT Cervical Spine Wo Contrast Result Date: 02/26/2024 CLINICAL DATA:  Unwitnessed fall. EXAM: CT CERVICAL SPINE WITHOUT CONTRAST TECHNIQUE: Multidetector CT imaging of the cervical spine was performed without intravenous contrast. Multiplanar CT  image reconstructions were also generated. RADIATION DOSE REDUCTION: This exam was performed according to the departmental dose-optimization program which includes automated exposure control, adjustment of the mA and/or kV according to patient size and/or use of iterative reconstruction technique. COMPARISON:  None Available. FINDINGS: Alignment: Normal. Skull base and vertebrae: No acute fracture. No primary bone lesion or focal pathologic process. Soft tissues and spinal canal: No prevertebral fluid or swelling. No visible canal  hematoma. Disc levels: Moderate to marked severity endplate sclerosis, anterior osteophyte formation and posterior bony spurring are seen at the level of C5-C6. Mild multilevel endplate sclerosis is seen throughout the remainder of the cervical spine. Mild intervertebral disc space narrowing is seen at the levels of C5-C6 and C6-C7. Bilateral moderate to marked severity multilevel facet joint hypertrophy is noted, right greater than left. Upper chest: Negative. Other: A 9 mm diameter thyroid nodule is seen within the left lobe of the thyroid gland. IMPRESSION: 1. No acute fracture or subluxation in the cervical spine. 2. Moderate to marked severity degenerative changes, most prominent at the level of C5-C6. 3. 9 mm diameter thyroid nodule within the left lobe of the thyroid gland. No follow-up imaging is recommended. This follows ACR consensus guidelines: Managing Incidental Thyroid Nodules Detected on Imaging: White Paper of the ACR Incidental Thyroid Findings Committee. J Am Coll Radiol 2015; 12:143-150. Electronically Signed   By: Aram Candela M.D.   On: 02/26/2024 23:04   CT Head Wo Contrast Result Date: 02/26/2024 CLINICAL DATA:  Unwitnessed fall. EXAM: CT HEAD WITHOUT CONTRAST TECHNIQUE: Contiguous axial images were obtained from the base of the skull through the vertex without intravenous contrast. RADIATION DOSE REDUCTION: This exam was performed according to the departmental dose-optimization program which includes automated exposure control, adjustment of the mA and/or kV according to patient size and/or use of iterative reconstruction technique. COMPARISON:  None Available. FINDINGS: Brain: A large area of cortical and white matter low attenuation is seen throughout the entire left parietal lobe. There is associated mass effect on the adjacent sulci and left lateral ventricle with approximate 7 mm left-to-right midline shift. No evidence of hemorrhage, hydrocephalus or extra-axial collection or  mass lesion. Vascular: No hyperdense vessel or unexpected calcification. Skull: Normal. Negative for fracture or focal lesion. Sinuses/Orbits: There is mild left maxillary sinus, left-sided sphenoid sinus and left ethmoid sinus mucosal thickening. Other: None. IMPRESSION: 1. 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 MRI correlation is recommended. 2. Associated mass effect on the adjacent sulci and left lateral ventricle with approximate 7 mm left-to-right midline shift. 3. Mild left maxillary sinus, left-sided sphenoid sinus and left ethmoid sinus disease. Electronically Signed   By: Aram Candela M.D.   On: 02/26/2024 23:00   (Echo, Carotid, EGD, Colonoscopy, ERCP)    Subjective:  No significant events overnight, patient eager to go home today, I have discussed plan of discharge with brother, aunt, sister-in-law at bedside Discharge Exam: Vitals:   03/03/24 0445 03/03/24 0824  BP: (!) 129/91 (!) 150/88  Pulse:  (!) 103  Resp:  19  Temp:  (!) 97.3 F (36.3 C)  SpO2:  94%   Vitals:   03/02/24 2327 03/03/24 0348 03/03/24 0445 03/03/24 0824  BP: (!) 145/82 (!) 145/95 (!) 129/91 (!) 150/88  Pulse:  (!) 104  (!) 103  Resp:  12  19  Temp:  97.8 F (36.6 C)  (!) 97.3 F (36.3 C)  TempSrc:  Oral  Oral  SpO2:  98%  94%  Weight:      Height:        Awake Alert, Oriented X painted time 1, impaired judgment and insight, is excited to go home today. Symmetrical Chest wall movement, Good air movement bilaterally, CTAB RRR,No Gallops,Rubs or new Murmurs, No Parasternal Heave +ve B.Sounds, Abd Soft, No tenderness, No rebound - guarding or rigidity. No Cyanosis, Clubbing or edema, No new Rash or bruise       The results of significant diagnostics from this hospitalization (including imaging, microbiology, ancillary and laboratory) are listed below for reference.     Microbiology: Recent Results (from the past 240 hours)  Resp panel by  RT-PCR (RSV, Flu A&B, Covid) Anterior Nasal Swab     Status: None   Collection Time: 02/26/24  7:44 PM   Specimen: Anterior Nasal Swab  Result Value Ref Range Status   SARS Coronavirus 2 by RT PCR NEGATIVE NEGATIVE Final    Comment: (NOTE) SARS-CoV-2 target nucleic acids are NOT DETECTED.  The SARS-CoV-2 RNA is generally detectable in upper respiratory specimens during the acute phase of infection. The lowest concentration of SARS-CoV-2 viral copies this assay can detect is 138 copies/mL. A negative result does not preclude SARS-Cov-2 infection and should not be used as the sole basis for treatment or other patient management decisions. A negative result may occur with  improper specimen collection/handling, submission of specimen other than nasopharyngeal swab, presence of viral mutation(s) within the areas targeted by this assay, and inadequate number of viral copies(<138 copies/mL). A negative result must be combined with clinical observations, patient history, and epidemiological information. The expected result is Negative.  Fact Sheet for Patients:  BloggerCourse.com  Fact Sheet for Healthcare Providers:  SeriousBroker.it  This test is no t yet approved or cleared by the [place] FDA and  has been authorized for detection and/or diagnosis of SARS-CoV-2 by FDA under an Emergency Use Authorization (EUA). This EUA will remain  in effect (meaning this test can be used) for the duration of the COVID-19 declaration under Section 564(b)(1) of the Act, 21 U.S.C.section 360bbb-3(b)(1), unless the authorization is terminated  or revoked sooner.       Influenza A by PCR NEGATIVE NEGATIVE Final   Influenza B by PCR NEGATIVE NEGATIVE Final    Comment: (NOTE) The Xpert Xpress SARS-CoV-2/FLU/RSV plus assay is intended as an aid in the diagnosis of influenza from Nasopharyngeal swab specimens and should not be used as a sole basis  for treatment. Nasal washings and aspirates are unacceptable for Xpert Xpress SARS-CoV-2/FLU/RSV testing.  Fact Sheet for Patients: BloggerCourse.com  Fact Sheet for Healthcare Providers: SeriousBroker.it  This test is not yet approved or cleared by the [place] FDA and has been authorized for detection and/or diagnosis of SARS-CoV-2 by FDA under an Emergency Use Authorization (EUA). This EUA will remain in effect (meaning this test can be used) for the duration of the COVID-19 declaration under Section 564(b)(1) of the Act, 21 U.S.C. section 360bbb-3(b)(1), unless the authorization is terminated or revoked.     Resp Syncytial Virus by PCR NEGATIVE NEGATIVE Final    Comment: (NOTE) Fact Sheet for Patients: BloggerCourse.com  Fact Sheet for Healthcare Providers: SeriousBroker.it  This test is not yet approved or cleared by the [place] FDA and has been authorized for detection and/or diagnosis of SARS-CoV-2 by FDA under an Emergency Use Authorization (EUA). This EUA will remain in effect (meaning this test can be used) for the duration of the COVID-19 declaration under Section  564(b)(1) of the Act, 21 U.S.C. section 360bbb-3(b)(1), unless the authorization is terminated or revoked.  Performed at [facility], [address], [place], [place] [address]      Labs: BNP (last 3 results) Recent Labs    09/18/23 0942 11/28/23 1000 12/06/23 1049  BNP 158.0* 197.0* 122.0*   Basic Metabolic Panel: Recent Labs  Lab 02/26/24 2013 02/28/24 0207 03/01/24 0551  NA 138 132* 136  K 4.1 3.8 4.2  CL 102 102 106  CO2 24 20* 24  GLUCOSE 196* 341* 224*  BUN 13 26* 29*  CREATININE 1.40* 1.64* 1.41*  CALCIUM 9.5 8.7* 8.2*   Liver Function Tests: Recent Labs  Lab 02/28/24 0207  AST 14*  ALT 16  ALKPHOS 105  BILITOT 0.6  PROT 6.0*  ALBUMIN 2.5*   No results for  input(s): "LIPASE", "AMYLASE" in the last 168 hours. Recent Labs  Lab 02/26/24 2013 02/28/24 2149  AMMONIA 13 15   CBC: Recent Labs  Lab 02/26/24 2013 02/28/24 0207 03/01/24 0551  WBC 12.0* 9.8 10.8*  HGB 11.2* 10.2* 10.4*  HCT 36.2* 32.3* 32.2*  MCV 81.0 79.2* 77.6*  PLT 437* 435* 378   Cardiac Enzymes: No results for input(s): "CKTOTAL", "CKMB", "CKMBINDEX", "TROPONINI" in the last 168 hours. BNP: Invalid input(s): "POCBNP" CBG: Recent Labs  Lab 03/02/24 1627 03/02/24 2115 03/03/24 0744 03/03/24 1130 03/03/24 1225  GLUCAP 421* 291* 319* 439* 458*   D-Dimer No results for input(s): "DDIMER" in the last 72 hours. Hgb A1c No results for input(s): "HGBA1C" in the last 72 hours. Lipid Profile No results for input(s): "CHOL", "HDL", "LDLCALC", "TRIG", "CHOLHDL", "LDLDIRECT" in the last 72 hours. Thyroid function studies No results for input(s): "TSH", "T4TOTAL", "T3FREE", "THYROIDAB" in the last 72 hours.  Invalid input(s): "FREET3" Anemia work up No results for input(s): "VITAMINB12", "FOLATE", "FERRITIN", "TIBC", "IRON", "RETICCTPCT" in the last 72 hours. Urinalysis    Component Value Date/Time   COLORURINE YELLOW 02/26/2024 2023   APPEARANCEUR HAZY (A) 02/26/2024 2023   LABSPEC 1.019 02/26/2024 2023   PHURINE 5.0 02/26/2024 2023   GLUCOSEU 50 (A) 02/26/2024 2023   HGBUR NEGATIVE 02/26/2024 2023   BILIRUBINUR NEGATIVE 02/26/2024 2023   KETONESUR NEGATIVE 02/26/2024 2023   PROTEINUR 100 (A) 02/26/2024 2023   UROBILINOGEN 0.2 01/01/2015 0912   NITRITE NEGATIVE 02/26/2024 2023   LEUKOCYTESUR NEGATIVE 02/26/2024 2023   Sepsis Labs Recent Labs  Lab 02/26/24 2013 02/28/24 0207 03/01/24 0551  WBC 12.0* 9.8 10.8*   Microbiology Recent Results (from the past 240 hours)  Resp panel by RT-PCR (RSV, Flu A&B, Covid) Anterior Nasal Swab     Status: None   Collection Time: 02/26/24  7:44 PM   Specimen: Anterior Nasal Swab  Result Value Ref Range Status   SARS  Coronavirus 2 by RT PCR NEGATIVE NEGATIVE Final    Comment: (NOTE) SARS-CoV-2 target nucleic acids are NOT DETECTED.  The SARS-CoV-2 RNA is generally detectable in upper respiratory specimens during the acute phase of infection. The lowest concentration of SARS-CoV-2 viral copies this assay can detect is 138 copies/mL. A negative result does not preclude SARS-Cov-2 infection and should not be used as the sole basis for treatment or other patient management decisions. A negative result may occur with  improper specimen collection/handling, submission of specimen other than nasopharyngeal swab, presence of viral mutation(s) within the areas targeted by this assay, and inadequate number of viral copies(<138 copies/mL). A negative result must be combined with clinical observations, patient history, and epidemiological  information. The expected result is Negative.  Fact Sheet for Patients:  BloggerCourse.com  Fact Sheet for Healthcare Providers:  SeriousBroker.it  This test is no t yet approved or cleared by the [place] FDA and  has been authorized for detection and/or diagnosis of SARS-CoV-2 by FDA under an Emergency Use Authorization (EUA). This EUA will remain  in effect (meaning this test can be used) for the duration of the COVID-19 declaration under Section 564(b)(1) of the Act, 21 U.S.C.section 360bbb-3(b)(1), unless the authorization is terminated  or revoked sooner.       Influenza A by PCR NEGATIVE NEGATIVE Final   Influenza B by PCR NEGATIVE NEGATIVE Final    Comment: (NOTE) The Xpert Xpress SARS-CoV-2/FLU/RSV plus assay is intended as an aid in the diagnosis of influenza from Nasopharyngeal swab specimens and should not be used as a sole basis for treatment. Nasal washings and aspirates are unacceptable for Xpert Xpress SARS-CoV-2/FLU/RSV testing.  Fact Sheet for  Patients: BloggerCourse.com  Fact Sheet for Healthcare Providers: SeriousBroker.it  This test is not yet approved or cleared by the [place] FDA and has been authorized for detection and/or diagnosis of SARS-CoV-2 by FDA under an Emergency Use Authorization (EUA). This EUA will remain in effect (meaning this test can be used) for the duration of the COVID-19 declaration under Section 564(b)(1) of the Act, 21 U.S.C. section 360bbb-3(b)(1), unless the authorization is terminated or revoked.     Resp Syncytial Virus by PCR NEGATIVE NEGATIVE Final    Comment: (NOTE) Fact Sheet for Patients: BloggerCourse.com  Fact Sheet for Healthcare Providers: SeriousBroker.it  This test is not yet approved or cleared by the [place] FDA and has been authorized for detection and/or diagnosis of SARS-CoV-2 by FDA under an Emergency Use Authorization (EUA). This EUA will remain in effect (meaning this test can be used) for the duration of the COVID-19 declaration under Section 564(b)(1) of the Act, 21 U.S.C. section 360bbb-3(b)(1), unless the authorization is terminated or revoked.  Performed at [facility], [address] [place], [place] [address]      Time coordinating discharge: Over 30 minutes  SIGNED:   Huey Bienenstock, MD  [facility] 03/03/2024, 1:00 PM Pager   If 7PM-7AM, please contact night-coverage [email or web address] Password [email or web address]

## 2024-03-03 NOTE — Progress Notes (Signed)
 [name] [phone] [facility] Liaison Note   Received request from Elfrida, Transitions of Care Manager, for hospice services at home after discharge. Spoke with patient, sister in law Fleet Contras and brother Leonette Most to initiate education related to hospice philosophy, services, and team approach to care. Patient/family verbalized understanding of information given. Per discussion, the plan is for discharge home by private vehicle on today.  DME needs discussed. Patient has the following equipment in the home:  personal adjustable bed and shower chair  Patient/family requests the following equipment for delivery:  wheelchair, walker, and bedside commode  The address has been verified: [address]  [place] [address]  Contact name and phone number here is the family contact to arrange time of equipment delivery: Fleet Contras [phone]    Please send signed and completed DNR home with patient/family. Please provide prescriptions at discharge as needed to ensure ongoing symptom management.   [facility] information and contact numbers given to sister in-law Fleet Contras.  Above information shared with team via Epic Chat.  Please call with any questions or concerns.   Thank you for the opportunity to participate in this patient's care.   Roe Rutherford, BSN, [employer] [phone]

## 2024-03-03 NOTE — Discharge Instructions (Signed)
 Management per hospice

## 2024-03-16 ENCOUNTER — Other Ambulatory Visit: Payer: Self-pay

## 2024-03-16 ENCOUNTER — Encounter (HOSPITAL_COMMUNITY): Payer: Self-pay

## 2024-03-16 ENCOUNTER — Emergency Department (HOSPITAL_COMMUNITY): Admission: EM | Admit: 2024-03-16 | Discharge: 2024-03-22 | Disposition: A | Discharge status: Skilled Nursing Facility

## 2024-03-16 ENCOUNTER — Emergency Department (HOSPITAL_COMMUNITY)

## 2024-03-16 DIAGNOSIS — W19XXXA Unspecified fall, initial encounter: Secondary | ICD-10-CM

## 2024-03-16 DIAGNOSIS — Z87891 Personal history of nicotine dependence: Secondary | ICD-10-CM | POA: Diagnosis not present

## 2024-03-16 DIAGNOSIS — Z79899 Other long term (current) drug therapy: Secondary | ICD-10-CM | POA: Diagnosis not present

## 2024-03-16 DIAGNOSIS — R739 Hyperglycemia, unspecified: Secondary | ICD-10-CM | POA: Diagnosis not present

## 2024-03-16 DIAGNOSIS — Z794 Long term (current) use of insulin: Secondary | ICD-10-CM | POA: Insufficient documentation

## 2024-03-16 DIAGNOSIS — R410 Disorientation, unspecified: Secondary | ICD-10-CM | POA: Diagnosis not present

## 2024-03-16 DIAGNOSIS — R519 Headache, unspecified: Secondary | ICD-10-CM | POA: Insufficient documentation

## 2024-03-16 DIAGNOSIS — R2681 Unsteadiness on feet: Secondary | ICD-10-CM | POA: Diagnosis present

## 2024-03-16 DIAGNOSIS — G9340 Encephalopathy, unspecified: Secondary | ICD-10-CM | POA: Diagnosis not present

## 2024-03-16 DIAGNOSIS — G9341 Metabolic encephalopathy: Secondary | ICD-10-CM | POA: Diagnosis present

## 2024-03-16 DIAGNOSIS — Z8546 Personal history of malignant neoplasm of prostate: Secondary | ICD-10-CM | POA: Insufficient documentation

## 2024-03-16 DIAGNOSIS — I10 Essential (primary) hypertension: Secondary | ICD-10-CM | POA: Diagnosis not present

## 2024-03-16 DIAGNOSIS — G4733 Obstructive sleep apnea (adult) (pediatric): Secondary | ICD-10-CM

## 2024-03-16 DIAGNOSIS — I2510 Atherosclerotic heart disease of native coronary artery without angina pectoris: Secondary | ICD-10-CM | POA: Insufficient documentation

## 2024-03-16 DIAGNOSIS — R296 Repeated falls: Secondary | ICD-10-CM

## 2024-03-16 DIAGNOSIS — D496 Neoplasm of unspecified behavior of brain: Secondary | ICD-10-CM | POA: Diagnosis not present

## 2024-03-16 DIAGNOSIS — Z720 Tobacco use: Secondary | ICD-10-CM | POA: Diagnosis present

## 2024-03-16 DIAGNOSIS — I480 Paroxysmal atrial fibrillation: Secondary | ICD-10-CM | POA: Diagnosis present

## 2024-03-16 DIAGNOSIS — T380X5A Adverse effect of glucocorticoids and synthetic analogues, initial encounter: Secondary | ICD-10-CM | POA: Diagnosis present

## 2024-03-16 DIAGNOSIS — C61 Malignant neoplasm of prostate: Secondary | ICD-10-CM | POA: Diagnosis present

## 2024-03-16 HISTORY — DX: Paroxysmal atrial fibrillation: I48.0

## 2024-03-16 LAB — URINALYSIS, ROUTINE W REFLEX MICROSCOPIC
Bacteria, UA: NONE SEEN
Bilirubin Urine: NEGATIVE
Glucose, UA: >=500 mg/dL — AB
Hgb urine dipstick: NEGATIVE
Ketones, ur: NEGATIVE mg/dL
Leukocytes,Ua: NEGATIVE
Nitrite: NEGATIVE
Protein, ur: 100 mg/dL — AB
Specific Gravity, Urine: 1.024 (ref 1.005–1.030)
pH: 5 (ref 5.0–8.0)

## 2024-03-16 LAB — BASIC METABOLIC PANEL
Anion gap: 13 (ref 5–15)
BUN: 26 mg/dL — ABNORMAL HIGH (ref 8–23)
CO2: 26 mmol/L (ref 22–32)
Calcium: 9 mg/dL (ref 8.9–10.3)
Chloride: 98 mmol/L (ref 98–111)
Creatinine, Ser: 1.13 mg/dL (ref 0.61–1.24)
GFR, Estimated: >60 mL/min (ref >60)
Glucose, Bld: 385 mg/dL — ABNORMAL HIGH (ref 70–99)
Potassium: 4.3 mmol/L (ref 3.5–5.1)
Sodium: 137 mmol/L (ref 135–145)

## 2024-03-16 LAB — CBC WITH DIFFERENTIAL/PLATELET
Abs Immature Granulocytes: 0.08 10*3/uL — ABNORMAL HIGH (ref 0.00–0.07)
Basophils Absolute: 0 10*3/uL (ref 0.0–0.1)
Basophils Relative: 0 %
Eosinophils Absolute: 0 10*3/uL (ref 0.0–0.5)
Eosinophils Relative: 0 %
HCT: 43.3 % (ref 39.0–52.0)
Hemoglobin: 13.3 g/dL (ref 13.0–17.0)
Immature Granulocytes: 1 %
Lymphocytes Relative: 5 %
Lymphs Abs: 0.7 10*3/uL (ref 0.7–4.0)
MCH: 25.7 pg — ABNORMAL LOW (ref 26.0–34.0)
MCHC: 30.7 g/dL (ref 30.0–36.0)
MCV: 83.6 fL (ref 80.0–100.0)
Monocytes Absolute: 0.6 10*3/uL (ref 0.1–1.0)
Monocytes Relative: 4 %
Neutro Abs: 14.2 10*3/uL — ABNORMAL HIGH (ref 1.7–7.7)
Neutrophils Relative %: 90 %
Platelets: 206 10*3/uL (ref 150–400)
RBC: 5.18 MIL/uL (ref 4.22–5.81)
RDW: 17.7 % — ABNORMAL HIGH (ref 11.5–15.5)
WBC: 15.7 10*3/uL — ABNORMAL HIGH (ref 4.0–10.5)
nRBC: 0 % (ref 0.0–0.2)

## 2024-03-16 MED ORDER — QUETIAPINE FUMARATE 25 MG PO TABS
50.0000 mg | ORAL_TABLET | Freq: Four times a day (QID) | ORAL | Status: DC
  Administered 2024-03-17 – 2024-03-22 (×20): 50 mg via ORAL
  Filled 2024-03-16 (×20): qty 2

## 2024-03-16 MED ORDER — DEXAMETHASONE 4 MG PO TABS
6.0000 mg | ORAL_TABLET | Freq: Two times a day (BID) | ORAL | Status: DC
  Administered 2024-03-17 – 2024-03-22 (×10): 6 mg via ORAL
  Filled 2024-03-16 (×10): qty 2

## 2024-03-16 MED ORDER — TAMSULOSIN HCL 0.4 MG PO CAPS
0.4000 mg | ORAL_CAPSULE | Freq: Every day | ORAL | Status: DC
  Administered 2024-03-18 – 2024-03-22 (×5): 0.4 mg via ORAL
  Filled 2024-03-16 (×5): qty 1

## 2024-03-16 MED ORDER — FINASTERIDE 5 MG PO TABS
5.0000 mg | ORAL_TABLET | Freq: Every day | ORAL | Status: DC
Start: 2024-03-17 — End: 2024-03-22
  Administered 2024-03-18 – 2024-03-22 (×5): 5 mg via ORAL
  Filled 2024-03-16 (×5): qty 1

## 2024-03-16 MED ORDER — ONDANSETRON HCL 4 MG PO TABS: 4.0000 mg | ORAL_TABLET | Freq: Four times a day (QID) | ORAL | Status: DC | PRN

## 2024-03-16 MED ORDER — GLIPIZIDE 5 MG PO TABS
10.0000 mg | ORAL_TABLET | Freq: Two times a day (BID) | ORAL | Status: DC
  Administered 2024-03-17 – 2024-03-20 (×6): 10 mg via ORAL
  Filled 2024-03-16 (×6): qty 2

## 2024-03-16 MED ORDER — OXYCODONE HCL 5 MG PO TABS
10.0000 mg | ORAL_TABLET | ORAL | Status: DC | PRN
  Administered 2024-03-17 – 2024-03-22 (×8): 10 mg via ORAL
  Filled 2024-03-16 (×8): qty 2

## 2024-03-16 MED ORDER — DILTIAZEM HCL ER COATED BEADS 180 MG PO CP24
180.0000 mg | ORAL_CAPSULE | Freq: Every day | ORAL | Status: DC
  Administered 2024-03-17 – 2024-03-22 (×6): 180 mg via ORAL
  Filled 2024-03-16 (×6): qty 1

## 2024-03-16 MED ORDER — LORAZEPAM 1 MG PO TABS
1.0000 mg | ORAL_TABLET | Freq: Four times a day (QID) | ORAL | Status: DC | PRN
  Administered 2024-03-17 – 2024-03-22 (×5): 1 mg via ORAL
  Filled 2024-03-16 (×6): qty 1

## 2024-03-16 MED ORDER — METHADONE HCL 10 MG PO TABS
5.0000 mg | ORAL_TABLET | Freq: Three times a day (TID) | ORAL | Status: DC
  Administered 2024-03-17 – 2024-03-22 (×15): 5 mg via ORAL
  Filled 2024-03-16 (×15): qty 1

## 2024-03-16 MED ORDER — PANTOPRAZOLE SODIUM 40 MG PO TBEC
40.0000 mg | DELAYED_RELEASE_TABLET | Freq: Two times a day (BID) | ORAL | Status: DC
  Administered 2024-03-17 – 2024-03-22 (×10): 40 mg via ORAL
  Filled 2024-03-16 (×10): qty 1

## 2024-03-16 MED ORDER — STERILE WATER FOR INJECTION IJ SOLN
INTRAMUSCULAR | Status: AC
  Filled 2024-03-16: qty 10

## 2024-03-16 MED ORDER — INSULIN GLARGINE-YFGN 100 UNIT/ML ~~LOC~~ SOLN
25.0000 [IU] | Freq: Every day | SUBCUTANEOUS | Status: DC
  Administered 2024-03-18 – 2024-03-20 (×3): 25 [IU] via SUBCUTANEOUS
  Filled 2024-03-16 (×5): qty 0.25

## 2024-03-16 MED ORDER — ZIPRASIDONE MESYLATE 20 MG IM SOLR
20.0000 mg | Freq: Once | INTRAMUSCULAR | Status: AC
  Administered 2024-03-16: 20 mg via INTRAMUSCULAR
  Filled 2024-03-16: qty 20

## 2024-03-16 MED ORDER — LEVETIRACETAM 500 MG PO TABS
500.0000 mg | ORAL_TABLET | Freq: Two times a day (BID) | ORAL | Status: DC
  Administered 2024-03-17 – 2024-03-22 (×10): 500 mg via ORAL
  Filled 2024-03-16 (×10): qty 1

## 2024-03-16 NOTE — ED Provider Notes (Cosign Needed Addendum)
 [place] EMERGENCY DEPARTMENT AT [facility] Provider Note   CSN: [id number] Arrival date & time: 03/16/24  1152     History  Chief Complaint  Patient presents with   Thomas Donovan is a 63 y.o. male.  He was recently diagnosed with aggressive metastatic prostate cancer, has a brain mass with vasogenic edema and is on steroids and Keppra.  Patient is DNR and is on hospice.  He is staying at home.  His sister reported to EMS that he had a fall today on the way to the bathroom.  He complained that his head hurt.  They would not want anything intervened upon if there are any abnormalities but she wanted to see if there was any injury.  Patient confused at baseline and is reportedly at his mental baseline at this time.  He reported to the nurse that his head hurt, denies any pain to me.   Fall       Home Medications Prior to Admission medications   Medication Sig Start Date End Date Taking? Authorizing Provider  dexamethasone (DECADRON) 6 MG tablet Take 1 tablet (6 mg total) by mouth 2 (two) times daily. 03/04/24   Elgergawy, Leana Roe, MD  diltiazem (CARDIZEM CD) 180 MG 24 hr capsule Take 1 capsule (180 mg total) by mouth daily. 03/03/24   Elgergawy, Leana Roe, MD  finasteride (PROSCAR) 5 MG tablet Take 1 tablet (5 mg total) by mouth daily. 03/03/24 03/03/25  Elgergawy, Leana Roe, MD  glipiZIDE (GLUCOTROL) 10 MG tablet Take 1 tablet (10 mg total) by mouth 2 (two) times daily before a meal. 03/03/24   Elgergawy, Leana Roe, MD  insulin glargine (LANTUS) 100 UNIT/ML Solostar Pen Inject 25 Units into the skin daily. 03/03/24   Elgergawy, Leana Roe, MD  levETIRAcetam (KEPPRA) 500 MG tablet Take 1 tablet (500 mg total) by mouth 2 (two) times daily. 03/03/24   Elgergawy, Leana Roe, MD  LORazepam (ATIVAN) 1 MG tablet Take 1 tablet (1 mg total) by mouth every 6 (six) hours as needed for anxiety or seizure. 03/03/24 03/03/25  Elgergawy, Leana Roe, MD  ondansetron (ZOFRAN) 4 MG tablet Take 1 tablet  (4 mg total) by mouth every 6 (six) hours as needed for nausea. 03/03/24   Elgergawy, Leana Roe, MD  pantoprazole (PROTONIX) 40 MG tablet Take 1 tablet (40 mg total) by mouth 2 (two) times daily. 03/03/24   Elgergawy, Leana Roe, MD  QUEtiapine (SEROQUEL) 25 MG tablet Take 1 tablet (25 mg total) by mouth at bedtime. 03/03/24   Elgergawy, Leana Roe, MD  tamsulosin (FLOMAX) 0.4 MG CAPS capsule Take 1 capsule (0.4 mg total) by mouth daily. 03/03/24   Elgergawy, Leana Roe, MD      Allergies    Celebrex [celecoxib], Guaifenesin, Ibuprofen, Tramadol, Acetaminophen, Aspirin, Ketorolac, and Toradol [ketorolac tromethamine]    Review of Systems   Review of Systems  Physical Exam Updated Vital Signs BP (!) 146/85 (BP Location: Right Arm)   Pulse 90   Temp 97.6 F (36.4 C) (Oral)   Resp 18   Ht 6\' 2"  (1.88 m)   Wt 112 kg   SpO2 95%   BMI 31.70 kg/m  Physical Exam Vitals and nursing note reviewed.  Constitutional:      General: He is not in acute distress.    Appearance: He is well-developed.  HENT:     Head: Normocephalic and atraumatic.     Mouth/Throat:     Mouth: Mucous membranes  are moist.  Eyes:     Extraocular Movements: Extraocular movements intact.     Conjunctiva/sclera: Conjunctivae normal.     Pupils: Pupils are equal, round, and reactive to light.  Cardiovascular:     Rate and Rhythm: Normal rate and regular rhythm.     Heart sounds: No murmur heard. Pulmonary:     Effort: Pulmonary effort is normal. No respiratory distress.     Breath sounds: Normal breath sounds.  Abdominal:     Palpations: Abdomen is soft.     Tenderness: There is no abdominal tenderness. There is no guarding or rebound.  Musculoskeletal:        General: No swelling.     Cervical back: Neck supple.  Skin:    General: Skin is warm and dry.     Capillary Refill: Capillary refill takes less than 2 seconds.     Findings: No bruising or erythema.  Neurological:     General: No focal deficit present.      Mental Status: He is alert. Mental status is at baseline. He is disoriented.  Psychiatric:        Mood and Affect: Mood normal.     ED Results / Procedures / Treatments   Labs (all labs ordered are listed, but only abnormal results are displayed) Labs Reviewed - No data to display  EKG None  Radiology No results found.  Procedures Procedures    Medications Ordered in ED Medications - No data to display  ED Course/ Medical Decision Making/ A&P Clinical Course as of 03/16/24 1828  Sat Mar 16, 2024  1500 Patient brought by EMS for a fall, I spoke with his sister-in-law Graeme Menees after being unable to reach his wife Renell Allum.  She states that she and her husband have been take care of the patient for the past 2 weeks, he was released to them because his wife does not have a home and struggles with addiction.  She states his agitation at times is bad and she does not have back surgery and they are no longer able to care for him and they are hoping he can be placed in an inpatient hospice. [CB]    Clinical Course User Index [CB] Ma Rings, PA-C                                 Medical Decision Making Differential diagnosis includes but not limited to closed head injury, adrenal hemorrhage, worsening cerebral edema or mass effect, infection, electrolyte abnormality, other  ED course: Patient sent in after fall today at home.  I spoke with his sister-in-law who has been caring for him since his discharge from the hospital.  He is currently on hosp[facility] with [facility].  Patient's sister-in-law and brother can no longer can care for him due to his agitation at home.  They want him placed in inpatient hospice.  Hospice is not able to do this, he is not appropriate for inpatient hospice due to not being end-of-life imminently.  He does not qualify for respite hospice as he ambulatory and does have intermittent agitation and the facility does not feel like it would be  safe for him to be there, they would need to have him palliatively sedated and they do not feel comfortable doing this.  Patient CT was had a normal, his labs show a mild leukocytosis but this is likely due to his Decadron use.  Blood sugar elevated at 385 but normal anion gap and normal CO2.  This is also likely related to the dexamethasone.  Will do med rec but plan to consult TOC to first SNF placement.  Patient's trauma have been told by hospice that they will have to rescind his hospice benefits to be placed by the hospital.  Awaiting med rec to reorder patient's home meds.  I spoke with Joni Reining at the hospice agency   Amount and/or Complexity of Data Reviewed Labs: ordered. Radiology: ordered.           Final Clinical Impression(s) / ED Diagnoses Final diagnoses:  None    Rx / DC Orders ED Discharge Orders     None         Ma Rings, PA-C 03/16/24 1919    Josem Kaufmann 03/16/24 1924    Glendora Score, MD 03/17/24 [phone]

## 2024-03-16 NOTE — Progress Notes (Signed)
 [facility] Liaison Note  Family reports unable to care for patient in the home any longer. Requesting assistance with placement from hospital. Family chose to revoke hospice benefit at this time to allow patient be placed in skilled facility.    Thank you for the opportunity to participate in this patient's care   Roe Rutherford, BSN, RN Hospice Nurse Liaison [phone]

## 2024-03-16 NOTE — ED Triage Notes (Signed)
 Pt is a hospice pt with brain cancer from home that is confused at baseline.  Pt had a fall today going to the bathroom per EMS and reports pt family wanted him to be seen to rule out any injuries even though they will not want to treat anything that is found.

## 2024-03-16 NOTE — Progress Notes (Signed)
 Hospital-?[facility]    This patient is?a current hospice patient with [facility], admitted 3.10.2025 with a terminal diagnosis of Carcinoma of the Prostate. ?   Patient is a DNR and Yunis Voorheis (sister in law) [phone] is the patient's primary CG.   We will continue to follow for any discharge planning needs and to coordinate continuation of?hospice care.?   Please don't hesitate to call with any Hospice related questions or concerns.    Current Medication List        Thank you for the opportunity to participate in this patient's care.    Roe Rutherford, BSN, RN Hospice Nurse Liaison [phone]

## 2024-03-16 NOTE — ED Notes (Signed)
 Pt tried to get out of bed and when nurse tried to get him back in, nurse noticed pt had a few of his home medications in his mouth. Pt then spit most of them at nurse and some were in the bed. Nurse informed PA

## 2024-03-17 ENCOUNTER — Encounter (HOSPITAL_COMMUNITY): Payer: Self-pay

## 2024-03-17 DIAGNOSIS — R519 Headache, unspecified: Secondary | ICD-10-CM | POA: Diagnosis not present

## 2024-03-17 LAB — CBG MONITORING, ED: Glucose-Capillary: 185 mg/dL — ABNORMAL HIGH (ref 70–99)

## 2024-03-17 MED ORDER — ZIPRASIDONE MESYLATE 20 MG IM SOLR
10.0000 mg | Freq: Once | INTRAMUSCULAR | Status: AC
  Administered 2024-03-17: 10 mg via INTRAMUSCULAR
  Filled 2024-03-17: qty 20

## 2024-03-17 MED ORDER — STERILE WATER FOR INJECTION IJ SOLN
INTRAMUSCULAR | Status: AC
  Administered 2024-03-17: 10 mL
  Filled 2024-03-17: qty 10

## 2024-03-17 NOTE — ED Provider Notes (Signed)
 Pt's nurse checked pt's vitals and HR was elevated.  EKG does show afib with HR in the 130s.  Pt does have afib and is supposed to be on cardizem. Nurse said pt had refused his meds today, so she was able to convince him to take his oral meds.     Jacalyn Lefevre, MD 03/17/24 [phone]

## 2024-03-17 NOTE — ED Notes (Signed)
 Missed dose of cardizem given per EDP verbal

## 2024-03-17 NOTE — ED Notes (Signed)
 Medications handed off to pharmacy tech Angelica to be locked up and stored in pharmacy.

## 2024-03-17 NOTE — ED Notes (Signed)
Gave pt ginger ale and crackers. 

## 2024-03-17 NOTE — ED Notes (Signed)
 Pt placed into private room to do pericare/linen change, update vitals and medicate. Pt noted to have elevated heart rate (130s), placed on monitor, EKG completed and given to EDP. Awaiting further orders.

## 2024-03-17 NOTE — ED Notes (Addendum)
 CSW updated by [facility] rep yesterday that pt is under hospice at home care with their agency. CSW updated that family is wishing to revoke hospice so pt can get placed into a SNF due to inability to care for pt. CSW updated treatment team that if hospice is revoked pt will revert to traditional Medicare beneifts and will need a 3 midnight inpatient hospital stay before he will qualify for his insurance to cover SNF. CSW udpated team that if this does not happen then pts family will need to private pay for his care at the facility for placement.   CSW notes today per chart review that pts family revoked hospice benefits and a consult for [facility] was placed for SNF placement. CSW again notes that pt will NOT qualify for SNF stay at this time and does not payor source for this.   CSW notes that pt has been living with his sister in law who has been offering care for the last two weeks due to pts wife being without a home and struggling with addiction.   CSW left voicemail for on call social worker with DSS requesting call back so that APS case can be made. Pt will need guardian as he is confused at baseline. TOC to follow.   Addendum 2pm: CSW spoke to [place] with DSS who took report. DSS will follow up tomorrow with CSW.

## 2024-03-18 DIAGNOSIS — R519 Headache, unspecified: Secondary | ICD-10-CM | POA: Diagnosis not present

## 2024-03-18 LAB — CBG MONITORING, ED: Glucose-Capillary: 325 mg/dL — ABNORMAL HIGH (ref 70–99)

## 2024-03-18 MED ORDER — NICOTINE 14 MG/24HR TD PT24
14.0000 mg | MEDICATED_PATCH | Freq: Once | TRANSDERMAL | Status: AC
  Administered 2024-03-18: 14 mg via TRANSDERMAL
  Filled 2024-03-18: qty 1

## 2024-03-18 NOTE — ED Notes (Signed)
 CW reviewed patient chart and read that he was under hospice care with [facility] and it was revoked for SNF placement. CSW asked the MD if pt would be admitted under observation and MD shared that patient does not meet admission criteria. CSW was informed that APS referral was made over the week. This CSW received a confidential voicemail today that patient APS report has been accepted . APS Social Worker [facility] will be handling case . This Clinical research associate did call [facility] and was informed that they have 72 hours to assess patient and that she will come out today or tomorrow and then she will reach out to family . [facility] did ask for a mini mental status exam . This Clinical research associate asked MD if this could be arranged. TOC will continue to follow.

## 2024-03-18 NOTE — Inpatient Diabetes Management (Signed)
 Inpatient Diabetes Program Recommendations  AACE/ADA: New Consensus Statement on Inpatient Glycemic Control   Target Ranges:  Prepandial:   less than 140 mg/dL      Peak postprandial:   less than 180 mg/dL (1-2 hours)      Critically ill patients:  140 - 180 mg/dL    Latest Reference Range & Units 03/17/24 21:48 03/18/24 09:05  Glucose-Capillary 70 - 99 mg/dL 962 (H) 952 (H)  (H): Data is abnormally high Review of Glycemic Control  Diabetes history: DM2 Outpatient Diabetes medications: Lantus 26 units daily; Decadron 6 mg BID Current orders for Inpatient glycemic control: Semglee 25 units daily, Glipizide 10 mg BID; Decadron 6 mg BID  Inpatient Diabetes Program Recommendations:    Insulin: Please consider ordering CBGs AC&HS, Novolog 0-20 units TID with meals and Novolog 0-5 units at bedtime.   Thanks, Orlando Penner, RN, MSN, CDCES Diabetes Coordinator Inpatient Diabetes Program [phone] (Team Pager from 8am to 5pm)

## 2024-03-18 NOTE — ED Provider Notes (Signed)
 Emergency Medicine Observation Re-evaluation Note  Thomas Donovan is a 63 y.o. male, seen on rounds today.  Pt initially presented to the ED for complaints of Fall Currently, the patient is awake and alert sitting upright drinking, accompanied by his wife.  Physical Exam  BP 122/80   Pulse (!) 104   Temp 99.6 F (37.6 C) (Axillary)   Resp 17   Ht 6\' 2"  (1.88 m)   Wt 112 kg   SpO2 99%   BMI 31.70 kg/m  Physical Exam General: No distress, obese elderly appearing male speaking clearly Cardiac: Tachycardia Psych: Patient pleasantly interactive.  Wife notes confusion  ED Course / MDM  EKG:EKG Interpretation Date/Time:  Sunday March 17 2024 21:34:33 EDT Ventricular Rate:  125 PR Interval:    QRS Duration:  83 QT Interval:  317 QTC Calculation: 458 R Axis:   41  Text Interpretation: Atrial fibrillation Since last tracing rate faster Confirmed by Thomas Donovan [phone]) on 03/17/2024 11:10:59 PM  I have reviewed the labs performed to date as well as medications administered while in observation.  Recent changes in the last 24 hours include ongoing efforts with social work for placement.  Plan  Current plan is for placement efforts to continue.    Thomas Munch, MD 03/18/24 [phone]

## 2024-03-19 DIAGNOSIS — R519 Headache, unspecified: Secondary | ICD-10-CM | POA: Diagnosis not present

## 2024-03-19 LAB — BASIC METABOLIC PANEL
Anion gap: 13 (ref 5–15)
BUN: 30 mg/dL — ABNORMAL HIGH (ref 8–23)
CO2: 21 mmol/L — ABNORMAL LOW (ref 22–32)
Calcium: 8.4 mg/dL — ABNORMAL LOW (ref 8.9–10.3)
Chloride: 95 mmol/L — ABNORMAL LOW (ref 98–111)
Creatinine, Ser: 1.28 mg/dL — ABNORMAL HIGH (ref 0.61–1.24)
GFR, Estimated: >60 mL/min (ref >60)
Glucose, Bld: 684 mg/dL (ref 70–99)
Potassium: 4.1 mmol/L (ref 3.5–5.1)
Sodium: 129 mmol/L — ABNORMAL LOW (ref 135–145)

## 2024-03-19 LAB — CBG MONITORING, ED
Glucose-Capillary: 568 mg/dL (ref 70–99)
Glucose-Capillary: >600 mg/dL (ref 70–99)
Glucose-Capillary: 577 mg/dL (ref 70–99)
Glucose-Capillary: 508 mg/dL (ref 70–99)
Glucose-Capillary: 511 mg/dL (ref 70–99)
Glucose-Capillary: 237 mg/dL — ABNORMAL HIGH (ref 70–99)

## 2024-03-19 LAB — BETA-HYDROXYBUTYRIC ACID: Beta-Hydroxybutyric Acid: 0.11 mmol/L (ref 0.05–0.27)

## 2024-03-19 MED ORDER — INSULIN ASPART 100 UNIT/ML IJ SOLN
0.0000 [IU] | Freq: Three times a day (TID) | INTRAMUSCULAR | Status: DC
  Administered 2024-03-19 (×2): 20 [IU] via SUBCUTANEOUS
  Administered 2024-03-20: 15 [IU] via SUBCUTANEOUS
  Administered 2024-03-20: 20 [IU] via SUBCUTANEOUS
  Administered 2024-03-20: 7 [IU] via SUBCUTANEOUS
  Administered 2024-03-21: 15 [IU] via SUBCUTANEOUS
  Administered 2024-03-21: 3 [IU] via SUBCUTANEOUS
  Administered 2024-03-21 – 2024-03-22 (×2): 15 [IU] via SUBCUTANEOUS
  Administered 2024-03-22: 20 [IU] via SUBCUTANEOUS
  Filled 2024-03-19 (×7): qty 1

## 2024-03-19 MED ORDER — INSULIN ASPART 100 UNIT/ML IJ SOLN
0.0000 [IU] | Freq: Every day | INTRAMUSCULAR | Status: DC
  Administered 2024-03-19: 2 [IU] via SUBCUTANEOUS

## 2024-03-19 NOTE — ED Notes (Signed)
 Dr Estell Harpin aware of pt blood sugar and concern of pt needing to be admitted to hospital for blood sugar control. No new orders received at this time.

## 2024-03-19 NOTE — ED Notes (Signed)
 Patient urinated on hallway floor and saturated gown and linens. This RN replaced linens and gown, provided patient with new urinal.

## 2024-03-19 NOTE — ED Provider Notes (Signed)
 Emergency Medicine Observation Re-evaluation Note  Thomas Donovan is a 63 y.o. male, seen on rounds today.  Pt initially presented to the ED for complaints of Fall Currently, the patient is resting.  Physical Exam  BP 130/87   Pulse (!) 103   Temp 97.7 F (36.5 C) (Oral)   Resp 18   Ht 6\' 2"  (1.88 m)   Wt 112 kg   SpO2 98%   BMI 31.70 kg/m  Physical Exam General: No acute distress Cardiac: Regular rate Lungs: No respiratory distress Psych: Currently calm  ED Course / MDM  EKG:EKG Interpretation Date/Time:  Sunday March 17 2024 21:34:33 EDT Ventricular Rate:  125 PR Interval:    QRS Duration:  83 QT Interval:  317 QTC Calculation: 458 R Axis:   41  Text Interpretation: Atrial fibrillation Since last tracing rate faster Confirmed by Jacalyn Lefevre [phone]) on 03/17/2024 11:10:59 PM  I have reviewed the labs performed to date as well as medications administered while in observation.  Recent changes in the last 24 hours include -no new changes. Patient has aggressive cancer, was enrolled in hospice.  Brought to ER by family members, who are unable to take care of him.  TOC has been consulted.  Patient has had some occasional tachycardia.  He has history of A-fib.  Patient has history of diabetes.  Diabetes navigator has recommended using glycemic control order set, which is a reasonable recommendation. CBG 4 times daily along with insulin sliding scale ordered.  Plan  Current plan is for collaborating with [facility] team to place the patient.    Derwood Kaplan, MD 03/19/24 [phone]  Reassessment: Blood sugar, after 20 units is > 600. BMP ordered. Diet changed to diabetic.   CRITICAL CARE Performed by: Charlissa Petros   Total critical care time: 33 minutes for hyperglycemia, multiple reassessments, insulin paraenteral.  Critical care time was exclusive of separately billable procedures and treating other patients.  Critical care was necessary to treat or prevent  imminent or life-threatening deterioration.  Critical care was time spent personally by me on the following activities: development of treatment plan with patient and/or surrogate as well as nursing, discussions with consultants, evaluation of patient's response to treatment, examination of patient, obtaining history from patient or surrogate, ordering and performing treatments and interventions, ordering and review of laboratory studies, ordering and review of radiographic studies, pulse oximetry and re-evaluation of patient's condition.    Derwood Kaplan, MD 03/20/24 [phone]

## 2024-03-19 NOTE — ED Notes (Signed)
 Patient ambulated to restroom, without calling for assistance. This nurse noticed patient walking toward automatic doors and patient was beginning to sway. Redirected patient back to hallway bed where the patient is refusing to get back in bed. Charge nurse Inetta Fermo notified.

## 2024-03-19 NOTE — ED Notes (Signed)
Pt given [place] sandwich and diet soda per request

## 2024-03-19 NOTE — Inpatient Diabetes Management (Signed)
 Inpatient Diabetes Program Recommendations  AACE/ADA: New Consensus Statement on Inpatient Glycemic Control   Target Ranges:  Prepandial:   less than 140 mg/dL      Peak postprandial:   less than 180 mg/dL (1-2 hours)      Critically ill patients:  140 - 180 mg/dL    Latest Reference Range & Units 03/17/24 21:48 03/18/24 09:05  Glucose-Capillary 70 - 99 mg/dL 130 (H) 865 (H)    Review of Glycemic Control  Diabetes history: DM2 Outpatient Diabetes medications: Lantus 26 units daily; Decadron 6 mg BID Current orders for Inpatient glycemic control: Semglee 25 units daily, Glipizide 10 mg BID; Decadron 6 mg BID   Inpatient Diabetes Program Recommendations:     Insulin: Please consider ordering CBGs AC&HS, Novolog 0-20 units TID with meals and Novolog 0-5 units at bedtime.    Thanks, Orlando Penner, RN, MSN, CDCES Diabetes Coordinator Inpatient Diabetes Program [phone] (Team Pager from 8am to 5pm)

## 2024-03-19 NOTE — ED Notes (Signed)
 Date and time results received: 03/19/24 1200   Test: CBG Critical Value: 568  Name of Provider Notified: Rhunette Croft, MD

## 2024-03-19 NOTE — ED Notes (Signed)
 Patient got out of bed refusing to side in bed, and stated " I have not fallen". Patient her due to fall.

## 2024-03-19 NOTE — ED Notes (Signed)
 ReChecked Pts CBG. EDP Notified, Pt's CBG reads HI.

## 2024-03-19 NOTE — [place] FL2 (Signed)
 [place] MEDICAID FL2 LEVEL OF CARE FORM     IDENTIFICATION  Patient Name: Thomas Donovan Birthdate: [birth year] Sex: male Admission Date (Current Location): 03/16/2024  [place] and [place] Number:  [employer] and Address:  [facility],  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   Brain tumor (HCC) 02/27/2024   Acute encephalopathy 02/27/2024   Brain mass 02/27/2024   Atrial fibrillation with RVR (HCC) 11/28/2023   Grade II hemorrhoids 11/02/2023   GIB (gastrointestinal bleeding) 11/01/2023   Alcohol use 10/31/2023   Mass in rectum 10/31/2023   DMII (diabetes mellitus, type 2) (HCC) 10/31/2023   Elevated d-dimer 10/27/2023   Paroxysmal atrial fibrillation with RVR (HCC) 10/26/2023   Acute kidney injury superimposed on CKD (HCC) 10/26/2023   Hypoalbuminemia 10/26/2023   Gout 10/26/2023   Chronic heart failure with preserved ejection fraction (HFpEF) (HCC) 10/26/2023   Drug abuse (HCC) 10/26/2023   Obesity (BMI 30-39.9) 10/26/2023   Thrombocytosis 10/26/2023   OSA on CPAP 10/26/2023   Chronic back pain 10/26/2023   Chronic anticoagulation 09/21/2023   Guaiac positive stools 09/20/2023   Acute drug-induced gout of left foot 09/20/2023   Symptomatic anemia 09/18/2023   Acute on chronic blood loss anemia 09/18/2023   GI bleed 09/18/2023   Alcohol use disorder 09/18/2023   Cocaine  abuse (HCC) 09/18/2023   Chronic kidney disease, stage 3a (HCC) 09/18/2023   BPH (benign prostatic hyperplasia) 09/18/2023   Hematuria 09/18/2023   Atrial fibrillation, chronic (HCC) 09/18/2023   Abscess of perineum    Tobacco use 08/22/2017   Hypokalemia 08/22/2017   GERD without esophagitis 08/22/2017   Uncontrolled type 2 diabetes mellitus with hyperglycemia, without long-term current use of insulin (HCC) 08/22/2017   Syncope 08/21/2017   Chest pain 08/21/2017   Dyspnea 08/21/2017   Hematochezia 08/21/2017   S/P lumbar spinal fusion 11/20/2013    Orientation RESPIRATION BLADDER Height & Weight     Self, Place  Normal Continent Weight: 246 lb 14.6 oz (112 kg) Height:  6\' 2"  (188 cm)  BEHAVIORAL SYMPTOMS/MOOD NEUROLOGICAL BOWEL NUTRITION STATUS      Continent Diet (See AVS)  AMBULATORY STATUS COMMUNICATION OF NEEDS Skin   Limited Assist Verbally Normal                       Personal Care Assistance Level of Assistance  Bathing, Feeding, Dressing Bathing Assistance: Maximum assistance Feeding assistance: Independent Dressing Assistance: Limited assistance     Functional Limitations Info  Sight, Hearing, Speech Sight Info: Adequate Hearing Info: Adequate Speech Info: Adequate    SPECIAL CARE FACTORS FREQUENCY                       Contractures Contractures Info: Not present    Additional Factors Info  Code Status, Allergies, Psychotropic Code Status Info: DNR-Comfort Allergies Info: Celebrex, Guaifenesin, Ibuprofen, Tramadol, Acetaminophen, Aspirin,  Ketorolac, and Toradol Psychotropic Info: Seroquel         Current Medications (03/19/2024):  This is the current hospital active medication list Current Facility-Administered Medications  Medication Dose Route Frequency Provider Last Rate Last Admin   dexamethasone (DECADRON) tablet 6 mg  6 mg Oral BID Triplett, Tammy, PA-C   6 mg at 03/19/24 0944   diltiazem (CARDIZEM CD) 24 hr capsule 180 mg  180 mg  Oral Daily Triplett, Tammy, PA-C   180 mg at 03/19/24 0914   finasteride (PROSCAR) tablet 5 mg  5 mg Oral Daily Triplett, Tammy, PA-C   5 mg at 03/19/24 0913   glipiZIDE (GLUCOTROL) tablet 10 mg  10 mg Oral BID AC Triplett, Tammy, PA-C   10 mg at 03/19/24 0945   insulin aspart (novoLOG) injection 0-20 Units  0-20 Units Subcutaneous TID WC Nanavati, Ankit, MD       insulin aspart (novoLOG) injection 0-5 Units  0-5 Units Subcutaneous QHS Rhunette Croft, Ankit, MD       insulin glargine-yfgn (SEMGLEE) injection 25 Units  25 Units Subcutaneous Daily Triplett, Tammy, PA-C   25 Units at 03/19/24 0918   levETIRAcetam (KEPPRA) tablet 500 mg  500 mg Oral BID Triplett, Tammy, PA-C   500 mg at 03/19/24 0913   LORazepam (ATIVAN) tablet 1 mg  1 mg Oral Q6H PRN Triplett, Tammy, PA-C   1 mg at 03/18/24 1458   methadone (DOLOPHINE) tablet 5 mg  5 mg Oral TID Triplett, Tammy, PA-C   5 mg at 03/19/24 0915   nicotine (NICODERM CQ - dosed in mg/24 hours) patch 14 mg  14 mg Transdermal Once Gloris Manchester, MD   14 mg at 03/18/24 1746   ondansetron (ZOFRAN) tablet 4 mg  4 mg Oral Q6H PRN Triplett, Tammy, PA-C       oxyCODONE (Oxy IR/ROXICODONE) immediate release tablet 10 mg  10 mg Oral Q4H PRN Triplett, Tammy, PA-C   10 mg at 03/19/24 0414   pantoprazole (PROTONIX) EC tablet 40 mg  40 mg Oral BID Triplett, Tammy, PA-C   40 mg at 03/19/24 0914   QUEtiapine (SEROQUEL) tablet 50 mg  50 mg Oral Q6H Triplett, Tammy, PA-C   50 mg at 03/19/24 0552   tamsulosin (FLOMAX) capsule 0.4 mg  0.4 mg Oral Daily Triplett, Tammy, PA-C   0.4 mg at 03/19/24 9811   Current Outpatient Medications  Medication Sig Dispense Refill   dexamethasone (DECADRON) 6 MG tablet Take 1 tablet (6 mg total) by mouth 2 (two) times daily. 60 tablet 0   diltiazem (CARDIZEM CD) 180 MG 24 hr capsule Take 1 capsule (180 mg total) by mouth daily. 30 capsule 0   finasteride (PROSCAR) 5 MG tablet Take 1 tablet (5 mg total) by mouth daily. 30 tablet 0   insulin glargine  (LANTUS) 100 UNIT/ML Solostar Pen Inject 25 Units into the skin daily. 15 mL 0   levETIRAcetam (KEPPRA) 500 MG tablet Take 1 tablet (500 mg total) by mouth 2 (two) times daily. 60 tablet 0   LORazepam (ATIVAN) 1 MG tablet Take 1 tablet (1 mg total) by mouth every 6 (six) hours as needed for anxiety or seizure. 30 tablet 0   methadone (DOLOPHINE) 5 MG tablet Take 5 mg by mouth 3 (three) times daily.     ondansetron (ZOFRAN) 4 MG tablet Take 1 tablet (4 mg total) by mouth every 6 (six) hours as needed for nausea. (Patient taking differently: Take 4 mg by mouth every 6 (six) hours  as needed for nausea or vomiting.) 20 tablet 0   Oxycodone HCl 10 MG TABS Take 10 mg by mouth every 4 (four) hours as needed (pain).     pantoprazole (PROTONIX) 40 MG tablet Take 1 tablet (40 mg total) by mouth 2 (two) times daily. 60 tablet 0   QUEtiapine (SEROQUEL) 50 MG tablet Take 100 mg by mouth every 6 (six) hours.     senna (SENOKOT) 8.6 MG TABS tablet Take 1 tablet by mouth in the morning and at bedtime.     tamsulosin (FLOMAX) 0.4 MG CAPS capsule Take 1 capsule (0.4 mg total) by mouth daily. 30 capsule 0     Discharge Medications: Please see discharge summary for a list of discharge medications.  Relevant Imaging Results:  Relevant Lab Results:   Additional Information [id number]  Isabella Bowens, [place]

## 2024-03-19 NOTE — ED Notes (Signed)
 Pt ambulated to restroom with nurse stand-by assist.

## 2024-03-19 NOTE — ED Notes (Signed)
 Date and time results received: 03/19/24 1416   Test: blood glucose Critical Value: 684  Name of Provider Notified: Rhunette Croft, MD

## 2024-03-20 ENCOUNTER — Encounter (HOSPITAL_COMMUNITY): Payer: Self-pay | Admitting: Family Medicine

## 2024-03-20 DIAGNOSIS — I480 Paroxysmal atrial fibrillation: Secondary | ICD-10-CM

## 2024-03-20 DIAGNOSIS — R519 Headache, unspecified: Secondary | ICD-10-CM | POA: Diagnosis not present

## 2024-03-20 DIAGNOSIS — C61 Malignant neoplasm of prostate: Secondary | ICD-10-CM

## 2024-03-20 DIAGNOSIS — R739 Hyperglycemia, unspecified: Secondary | ICD-10-CM | POA: Diagnosis not present

## 2024-03-20 DIAGNOSIS — D496 Neoplasm of unspecified behavior of brain: Secondary | ICD-10-CM | POA: Diagnosis not present

## 2024-03-20 DIAGNOSIS — T380X5A Adverse effect of glucocorticoids and synthetic analogues, initial encounter: Secondary | ICD-10-CM

## 2024-03-20 DIAGNOSIS — G9340 Encephalopathy, unspecified: Secondary | ICD-10-CM

## 2024-03-20 DIAGNOSIS — R296 Repeated falls: Secondary | ICD-10-CM

## 2024-03-20 DIAGNOSIS — R2681 Unsteadiness on feet: Secondary | ICD-10-CM

## 2024-03-20 DIAGNOSIS — I10 Essential (primary) hypertension: Secondary | ICD-10-CM

## 2024-03-20 LAB — CBG MONITORING, ED
Glucose-Capillary: 322 mg/dL — ABNORMAL HIGH (ref 70–99)
Glucose-Capillary: 397 mg/dL — ABNORMAL HIGH (ref 70–99)
Glucose-Capillary: 213 mg/dL — ABNORMAL HIGH (ref 70–99)
Glucose-Capillary: 79 mg/dL (ref 70–99)

## 2024-03-20 MED ORDER — INSULIN GLARGINE-YFGN 100 UNIT/ML ~~LOC~~ SOLN
25.0000 [IU] | Freq: Two times a day (BID) | SUBCUTANEOUS | Status: DC
  Administered 2024-03-20 – 2024-03-22 (×3): 25 [IU] via SUBCUTANEOUS
  Filled 2024-03-20 (×6): qty 0.25

## 2024-03-20 MED ORDER — INSULIN ASPART 100 UNIT/ML IJ SOLN: 8.0000 [IU] | Freq: Three times a day (TID) | INTRAMUSCULAR | Status: DC

## 2024-03-20 MED ORDER — INSULIN GLARGINE-YFGN 100 UNIT/ML ~~LOC~~ SOLN
35.0000 [IU] | Freq: Every day | SUBCUTANEOUS | Status: DC
  Filled 2024-03-20: qty 0.35

## 2024-03-20 MED ORDER — INSULIN ASPART 100 UNIT/ML IJ SOLN
12.0000 [IU] | Freq: Three times a day (TID) | INTRAMUSCULAR | Status: DC
  Administered 2024-03-20 – 2024-03-22 (×6): 12 [IU] via SUBCUTANEOUS
  Filled 2024-03-20 (×4): qty 1

## 2024-03-20 MED ORDER — SENNA 8.6 MG PO TABS
1.0000 | ORAL_TABLET | Freq: Every day | ORAL | Status: DC
  Administered 2024-03-20 – 2024-03-21 (×2): 8.6 mg via ORAL
  Filled 2024-03-20 (×2): qty 1

## 2024-03-20 MED ORDER — INSULIN GLARGINE-YFGN 100 UNIT/ML ~~LOC~~ SOLN
10.0000 [IU] | Freq: Once | SUBCUTANEOUS | Status: DC
  Filled 2024-03-20: qty 0.1

## 2024-03-20 NOTE — ED Notes (Signed)
 Pt crying saying he is hungry and nobody here is giving him his meds. Assured pt that we are getting meds as soon as available and will continue. Also assured pt of breakfast coming.

## 2024-03-20 NOTE — ED Notes (Signed)
 CSW sent patient information out to several facilities yesterday after speaking with [facility] Contras and [name] yesterday about options. [facility] prefers patient to be local . CSW did share with spouse [name] that patient monthly check will go to facility; spouse is in agreement with giving up check. At this time patient has two bed offers and 2 considering bed offers. However , CSW spoke with facilities and share that patient will need LTC, was on Hospice at home, but revoked it, so he could get into a facility and that [facility] supervisor is willing to give facility LOG .  [facility] and [facility] both said that they will have to check with cooperate. TOC to follow

## 2024-03-20 NOTE — Consult Note (Signed)
 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  He had been evaluated by neurosurgery, oncology, neurology and palliative medicine at [facility] and the recommendation was for palliative care due to advanced, highly aggressive metastatic prostate cancer with rapid progression and determined to be incurable.  Unfortunately after being at home family brought him back to the emergency department because they have been unable to care for him at home due to his extensive skilled care needs.  They apparently decided to revoke hospice benefits and seek out SNF placement.  Patient has been holding in the emergency department for SNF placement while TOC is working to find him a bed.  Because he has been on Decadron for managing vasogenic brain edema he has been experiencing steroid exacerbated hyperglycemia.  The ED providers have requested a medical consultation to assist with glycemic management while he is waiting for a SNF bed for placement.     Past Medical History:  Diagnosis Date   Aortic aneurysm (HCC) 2019   Chronic back pain    Cold    recent rx   Coronary artery disease    Degeneration of lumbar intervertebral disc    Diabetes mellitus    GERD (gastroesophageal reflux disease)    Gout    History of kidney stones     Hypertension    Paroxysmal atrial fibrillation (HCC)    Pneumonia     Past Surgical History:  Procedure Laterality Date   BACK SURGERY     BIOPSY  11/02/2023   Procedure: BIOPSY;  Surgeon: Franky Macho, MD;  Location: [facility] SU[facility];  Service: Endoscopy;;   FLEXIBLE SIGMOIDOSCOPY N/A 11/02/2023   Procedure: FLEXIBLE SIGMOIDOSCOPY;  Surgeon: Franky Macho, MD;  Location: [facility] END[facility] SU[facility];  Service: Endoscopy;  Laterality: N/A;   IRRIGATION AND DEBRIDEMENT ABSCESS N/A 07/15/2019   Procedure: IRRIGATION AND DRAINAGE OF PERINEAL ABSCESS;  Surgeon: Franky Macho, MD;  Location: [facility] ORS;  Service: General;  Laterality: N/A;   MAXIMUM ACCESS (MAS)POSTERIOR LUMBAR INTERBODY FUSION (PLIF) 1 LEVEL  11/20/2013   Procedure: FOR MAXIMUM ACCESS (MAS) POSTERIOR LUMBAR INTERBODY FUSION LUMBAR FIVE-SACRAL ONE;  Surgeon: Tia Alert, MD;  Location: [facility] NEURO ORS;  Service: Neurosurgery;;  FOR MAXIMUM ACCESS (MAS) POSTERIOR LUMBAR INTERBODY FUSION LUMBAR FIVE-SACRAL ONE   TONSILLECTOMY       reports that he has quit smoking. His smoking use included cigarettes. He has a 5 pack-year smoking history. He has never used smokeless tobacco. He reports current alcohol use of about 14.0 standard drinks of alcohol per week. He reports that he does not use drugs.  Allergies  Allergen Reactions   Celebrex [Celecoxib] Anaphylaxis   Guaifenesin Anaphylaxis   Ibuprofen Hives and Other (See Comments)    GI bleeding OTC NSAIDS cause blood in stools   Tramadol Hives and Rash   Acetaminophen Other (See Comments)    Liver issues  Aspirin Other (See Comments)    Pt was told not to take by MD due to being on Colchicine.   Ketorolac Hives    Blisters between fingers   Toradol [Ketorolac Tromethamine] Other (See Comments)    Blisters between fingers     Family History  Problem Relation Age of Onset   Colon cancer Neg Hx     Prior to Admission medications   Medication Sig Start Date End Date Taking? Authorizing  Provider  dexamethasone (DECADRON) 6 MG tablet Take 1 tablet (6 mg total) by mouth 2 (two) times daily. 03/04/24  Yes Elgergawy, Leana Roe, MD  diltiazem (CARDIZEM CD) 180 MG 24 hr capsule Take 1 capsule (180 mg total) by mouth daily. 03/03/24  Yes Elgergawy, Leana Roe, MD  finasteride (PROSCAR) 5 MG tablet Take 1 tablet (5 mg total) by mouth daily. 03/03/24 03/03/25 Yes Elgergawy, Leana Roe, MD  insulin glargine (LANTUS) 100 UNIT/ML Solostar Pen Inject 25 Units into the skin daily. 03/03/24  Yes Elgergawy, Leana Roe, MD  levETIRAcetam (KEPPRA) 500 MG tablet Take 1 tablet (500 mg total) by mouth 2 (two) times daily. 03/03/24  Yes Elgergawy, Leana Roe, MD  LORazepam (ATIVAN) 1 MG tablet Take 1 tablet (1 mg total) by mouth every 6 (six) hours as needed for anxiety or seizure. 03/03/24 03/03/25 Yes Elgergawy, Leana Roe, MD  methadone (DOLOPHINE) 5 MG tablet Take 5 mg by mouth 3 (three) times daily. 03/05/24  Yes [provider]  ondansetron (ZOFRAN) 4 MG tablet Take 1 tablet (4 mg total) by mouth every 6 (six) hours as needed for nausea. Patient taking differently: Take 4 mg by mouth every 6 (six) hours as needed for nausea or vomiting. 03/03/24  Yes Elgergawy, Leana Roe, MD  Oxycodone HCl 10 MG TABS Take 10 mg by mouth every 4 (four) hours as needed (pain). 03/14/24  Yes [provider]  pantoprazole (PROTONIX) 40 MG tablet Take 1 tablet (40 mg total) by mouth 2 (two) times daily. 03/03/24  Yes Elgergawy, Leana Roe, MD  QUEtiapine (SEROQUEL) 50 MG tablet Take 100 mg by mouth every 6 (six) hours. 03/14/24  Yes [provider]  senna (SENOKOT) 8.6 MG TABS tablet Take 1 tablet by mouth in the morning and at bedtime.   Yes [provider]  tamsulosin (FLOMAX) 0.4 MG CAPS capsule Take 1 capsule (0.4 mg total) by mouth daily. 03/03/24  Yes Elgergawy, Leana Roe, MD    Physical Exam: Vitals:   03/18/24 1200 03/18/24 2337 03/19/24 0930 03/19/24 1939  BP: (!) 161/89 (!) 161/91 130/87 (!) 155/80  Pulse:   (!) 118 (!) 103 (!) 102  Resp: 13 13 18 18   Temp:  97.7 F (36.5 C)  98.1 F (36.7 C)  TempSrc:  Oral  Oral  SpO2:  95% 98% 99%  Weight:      Height:       Constitutional: NAD, calm, comfortable, sitting up in chair, he is confused. Eyes: PERRL, lids and conjunctivae normal ENMT: Mucous membranes are moist. Posterior pharynx clear of any exudate or lesions. Poor dentition.  Neck: normal, supple, no masses, no thyromegaly Respiratory: clear to auscultation bilaterally, no wheezing, no crackles. Normal respiratory effort. No accessory muscle use.  Cardiovascular: normal s1, s2 sounds, no murmurs / rubs / gallops. No extremity edema. 2+ pedal pulses. No carotid bruits.  Abdomen: no tenderness, no masses palpated. No hepatosplenomegaly. Bowel sounds positive.  Musculoskeletal: no clubbing / cyanosis. No joint deformity upper and lower extremities. Good ROM,  no contractures. Normal muscle tone.  Skin: no rashes, lesions, ulcers. No induration Neurologic: CN 2-12 grossly intact. Sensation intact, DTR normal. Strength 5/5 in all 4.  Psychiatric: Poor judgment and insight. Alert but encephalopathic. Normal mood.   Labs on Admission: I have personally reviewed following labs and imaging studies  CBC: Recent Labs  Lab 03/16/24 1644  WBC 15.7*  NEUTROABS 14.2*  HGB 13.3  HCT 43.3  MCV 83.6  PLT 206   Basic Metabolic Panel: Recent Labs  Lab 03/16/24 1644 03/19/24 1352  NA 137 129*  K 4.3 4.1  CL 98 95*  CO2 26 21*  GLUCOSE 385* 684*  BUN 26* 30*  CREATININE 1.13 1.28*  CALCIUM 9.0 8.4*   GFR: Estimated Creatinine Clearance: 80.7 mL/min (A) (by C-G formula based on SCr of 1.28 mg/dL (H)). Liver Function Tests: No results for input(s): "AST", "ALT", "ALKPHOS", "BILITOT", "PROT", "ALBUMIN" in the last 168 hours. No results for input(s): "LIPASE", "AMYLASE" in the last 168 hours. No results for input(s): "AMMONIA" in the last 168 hours. Coagulation Profile: No results for  input(s): "INR", "PROTIME" in the last 168 hours. Cardiac Enzymes: No results for input(s): "CKTOTAL", "CKMB", "CKMBINDEX", "TROPONINI" in the last 168 hours. BNP (last 3 results) No results for input(s): "PROBNP" in the last 8760 hours. HbA1C: No results for input(s): "HGBA1C" in the last 72 hours. CBG: Recent Labs  Lab 03/19/24 1537 03/19/24 1630 03/19/24 1709 03/19/24 2115 03/20/24 0755  GLUCAP 577* 508* 511* 237* 322*   Lipid Profile: No results for input(s): "CHOL", "HDL", "LDLCALC", "TRIG", "CHOLHDL", "LDLDIRECT" in the last 72 hours. Thyroid Function Tests: No results for input(s): "TSH", "T4TOTAL", "FREET4", "T3FREE", "THYROIDAB" in the last 72 hours. Anemia Panel: No results for input(s): "VITAMINB12", "FOLATE", "FERRITIN", "TIBC", "IRON", "RETICCTPCT" in the last 72 hours. Urine analysis:    Component Value Date/Time   COLORURINE YELLOW 03/16/2024 1757   APPEARANCEUR CLEAR 03/16/2024 1757   LABSPEC 1.024 03/16/2024 1757   PHURINE 5.0 03/16/2024 1757   GLUCOSEU >=500 (A) 03/16/2024 1757   HGBUR NEGATIVE 03/16/2024 1757   BILIRUBINUR NEGATIVE 03/16/2024 1757   KETONESUR NEGATIVE 03/16/2024 1757   PROTEINUR 100 (A) 03/16/2024 1757   UROBILINOGEN 0.2 01/01/2015 0912   NITRITE NEGATIVE 03/16/2024 1757   LEUKOCYTESUR NEGATIVE 03/16/2024 1757    Radiological Exams on Admission: No results found.  Assessment/Plan Active Problems:   Steroid-induced hyperglycemia   Tobacco use   Paroxysmal atrial fibrillation with RVR (HCC)   OSA on CPAP   Brain tumor (HCC)   Acute encephalopathy   Prostate cancer metastatic to multiple sites [facility])   Gait instability   Falls frequently   Essential (primary) hypertension   Recommendations   Uncontrolled type 2 diabetes mellitus, insulin requiring with steroid induced hyperglycemia - DC Glipizide - increase glargine dosing to 25 units twice daily  - start novolog 12 units TID with meals eaten more than 50%  - check CBG 5  times daily  - avoid concentrated sweets or fruit juices except to treat a low blood glucose - given that he is end of life/hospice, ok with continuing regular diet for comfort  - anticipate given his advancing brain tumor and aggressive disease he will not be able to come off dexamethasone   Metabolic encephalopathy -Secondary to brain mass with vasogenic edema -Uncontrolled hyperglycemia -Seizure disorder  -Recently seen by neurology and placed on Keppra and Decadron  History of PAF with RVR -He has been restarted on home diltiazem -Anticoagulation held due to  brain metastasis  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 Brain mass with vasogenic edema -Continue Decadron and Keppra as above -Patient has incurable disease -Patient not a candidate for surgical interventions per neurosurgeon -Pain management and symptom management as ordered  Essential hypertension -Resume home Cardizem CD  Frequent Falls, Gait Instability - Fall precautions - TOC working on placement    [employer] MD [facility] How to contact the [facility] Attending or Consulting provider 7A - 7P or covering provider during after hours 7P -7A, for this patient?  Check the care team in [facility] and look for a) attending/consulting TRH provider listed and b) the [facility] team listed Log into [email or web address] and use [place]'s universal password to access. If you do not have the password, please contact the hospital operator. Locate the [facility] provider you are looking for under [facility] and page to a number that you can be directly reached. If you still have difficulty reaching the provider, please page the [facility] (Director on Call) for the Hospitalists listed on amion for assistance.   If 7PM-7AM, please contact night-coverage [email or web address] Password [facility]  03/20/2024, 11:55 AM

## 2024-03-20 NOTE — ED Notes (Signed)
 Pt care taken, is resting, even chest rise and fall.

## 2024-03-20 NOTE — ED Notes (Signed)
 Gave patient macaroni and cheese dinner.

## 2024-03-20 NOTE — Inpatient Diabetes Management (Signed)
 Inpatient Diabetes Program Recommendations  AACE/ADA: New Consensus Statement on Inpatient Glycemic Control   Target Ranges:  Prepandial:   less than 140 mg/dL      Peak postprandial:   less than 180 mg/dL (1-2 hours)      Critically ill patients:  140 - 180 mg/dL    Latest Reference Range & Units 03/18/24 09:05 03/19/24 11:58 03/19/24 13:28 03/19/24 15:37 03/19/24 16:30 03/19/24 17:09 03/19/24 21:15 03/20/24 07:55  Glucose-Capillary 70 - 99 mg/dL 161 (H) 096 (HH) >045 (HH) 577 (HH) 508 (HH) 511 (HH) 237 (H) 322 (H)   Review of Glycemic Control  Diabetes history: DM2 Outpatient Diabetes medications: Lantus 26 units daily; Decadron 6 mg BID Current orders for Inpatient glycemic control: Semglee 25 units daily, Glipizide 10 mg BID; Decadron 6 mg BID, Novolog 0-20 units TID with meals, Novolog 0-5 units QHS   Inpatient Diabetes Program Recommendations:     Insulin: If Decadron is continued as ordered, please consider increasing Semglee to 35 units daily to start 3/27 (also order Semglee 10 units x1 now for total of 35 units today) and adding Novolog 8 units TID with meals for meal coverage if patient eats at least 50% of meals.  Thanks, Orlando Penner, RN, MSN, CDCES Diabetes Coordinator Inpatient Diabetes Program [phone] (Team Pager from 8am to 5pm)

## 2024-03-20 NOTE — Hospital Course (Addendum)
 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  He had been evaluated by neurosurgery, oncology, neurology and palliative medicine at [facility] and the recommendation was for palliative care due to advanced, highly aggressive metastatic prostate cancer with rapid progression and determined to be incurable.  Unfortunately after being at home family brought him back to the emergency department because they have been unable to care for him at home due to his extensive skilled care needs.  They apparently decided to revoke hospice benefits and seek out SNF placement.  Patient has been holding in the emergency department for SNF placement while TOC is working to find him a bed.  Because he has been on Decadron for managing vasogenic brain edema he has been experiencing steroid exacerbated hyperglycemia.  The ED providers have requested a medical consultation to assist with glycemic management while he is waiting for a SNF bed for placement.

## 2024-03-21 DIAGNOSIS — D496 Neoplasm of unspecified behavior of brain: Secondary | ICD-10-CM | POA: Diagnosis not present

## 2024-03-21 DIAGNOSIS — R519 Headache, unspecified: Secondary | ICD-10-CM | POA: Diagnosis not present

## 2024-03-21 DIAGNOSIS — C61 Malignant neoplasm of prostate: Secondary | ICD-10-CM | POA: Diagnosis not present

## 2024-03-21 DIAGNOSIS — G9340 Encephalopathy, unspecified: Secondary | ICD-10-CM | POA: Diagnosis not present

## 2024-03-21 DIAGNOSIS — R739 Hyperglycemia, unspecified: Secondary | ICD-10-CM | POA: Diagnosis not present

## 2024-03-21 LAB — CBG MONITORING, ED
Glucose-Capillary: 167 mg/dL — ABNORMAL HIGH (ref 70–99)
Glucose-Capillary: 325 mg/dL — ABNORMAL HIGH (ref 70–99)
Glucose-Capillary: 326 mg/dL — ABNORMAL HIGH (ref 70–99)
Glucose-Capillary: 133 mg/dL — ABNORMAL HIGH (ref 70–99)
Glucose-Capillary: 134 mg/dL — ABNORMAL HIGH (ref 70–99)

## 2024-03-21 MED ORDER — POLYETHYLENE GLYCOL 3350 17 G PO PACK
17.0000 g | PACK | Freq: Two times a day (BID) | ORAL | Status: DC
  Administered 2024-03-21: 17 g via ORAL
  Filled 2024-03-21: qty 1

## 2024-03-21 MED ORDER — BISACODYL 10 MG RE SUPP
10.0000 mg | Freq: Once | RECTAL | Status: AC
  Administered 2024-03-21: 10 mg via RECTAL
  Filled 2024-03-21: qty 1

## 2024-03-21 NOTE — ED Notes (Addendum)
 This Clinical research associate reached out to [employer] and [place]. Jill Side with [facility] has not return this writer secure message back about offering a bed. [facility] declined. TOC to follow and continue to send out patient information.   Addendum : 2:00 pm   This writer called [facility] who was considering a bed offer and spoke with [employer], admission Interior and spatial designer. Crystal printed off patient paperwork and spoke with her DON. Crystal then call CSW back and said that they can offer patient a bed. The agreement was for [facility] to do an LOG and family sign over patient check to facility. CSW did speak with Fleet Contras and her husband and they agreed on bed offer. CSW did ask Fleet Contras if DSS had reached out to them and she said they had and expressed that DSS asked for them to be [facility] and they are agreeable , if they need to be. CSW is completing LOG now. TOC will continue to follow.

## 2024-03-21 NOTE — Progress Notes (Signed)
 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  He had been evaluated by neurosurgery, oncology, neurology and palliative medicine at [facility] and the recommendation was for palliative care due to advanced, highly aggressive metastatic prostate cancer with rapid progression and determined to be incurable.  Unfortunately after being at home family brought him back to the emergency department because they have been unable to care for him at home due to his extensive skilled care needs.  They apparently decided to revoke hospice benefits and seek out SNF placement.  Patient has been holding in the emergency department for SNF placement while TOC is working to find him a bed.  Because he has been on Decadron for managing vasogenic brain edema he has been experiencing steroid exacerbated hyperglycemia.  The ED providers have requested a medical consultation to assist with glycemic management while he is waiting for a SNF bed for placement.     Assessment and Plan:  Uncontrolled type 2 diabetes mellitus, insulin requiring with steroid-induced hyperglycemia - DC Glipizide - increased glargine dosing to 25 units twice daily  - novolog 12 units TID with meals eaten more than 50%  - check CBG 5 times daily  - try to avoid concentrated sweets or fruit juices except to treat a low blood glucose - given that he is end of life/hospice, ok with continuing  regular diet for comfort and allow snacks as he wants - expect that given his advancing brain tumor and aggressive disease he will not be able to come off dexamethasone   CBG (last 3)  Recent Labs    03/20/24 2319 03/21/24 0325 03/21/24 0859  GLUCAP 79 167* 325*    Metabolic encephalopathy -Secondary to brain mass with vasogenic edema -Uncontrolled hyperglycemia -Seizure disorder  -Recently seen by neurology and placed on Keppra and Decadron   History of PAF with RVR -He has been restarted on home diltiazem -Anticoagulation held due to brain metastasis   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 Brain mass with vasogenic edema -Continue Decadron and Keppra as above -Patient has incurable disease -Patient not a candidate for surgical interventions per neurosurgeon -Pain management and symptom management as ordered   Essential hypertension -Resume home Cardizem CD   Frequent Falls, Gait Instability - Fall precautions - TOC working on placement    Disposition: awaiting SNF placement    Subjective: Pt has good appetite and has been eating very well and asking for more food.  He is eager to "go home"  He does have headache but no other complaints at this time.    Objective: Vitals:   03/19/24 0930 03/19/24 1939 03/20/24 1427 03/20/24 2330  BP: 130/87 (!) 155/80 (!) 147/82 133/80  Pulse: (!) 103 (!) 102 68 88  Resp: 18 18 18 18   Temp:  98.1 F (36.7 C) 98.6 F (37 C) 98.4 F (36.9 C)  TempSrc:  Oral Oral   SpO2: 98% 99% 100% 98%  Weight:      Height:  Intake/Output Summary (Last 24 hours) at 03/21/2024 0953 Last data filed at 03/20/2024 1720 Gross per 24 hour  Intake --  Output 250 ml  Net -250 ml   Filed Weights   03/16/24 1220  Weight: 112 kg   Examination:  General exam: Appears calm and NAD. He is eating.  He is confused at baseline.  Respiratory system: Clear to auscultation. Respiratory effort normal. Cardiovascular  system: normal S1 & S2 heard. No JVD, murmurs, rubs, gallops or clicks. No pedal edema. Gastrointestinal system: Abdomen is nondistended, soft and nontender. No organomegaly or masses felt. Normal bowel sounds heard. Central nervous system: Alert and disoriented. No focal neurological deficits. Extremities: Symmetric 5 x 5 power. Skin: No rashes, lesions or ulcers. Psychiatry: Judgement and insight appear diminished. Mood & affect appropriate.   Data Reviewed: I have personally reviewed following labs and imaging studies  CBC: Recent Labs  Lab 03/16/24 1644  WBC 15.7*  NEUTROABS 14.2*  HGB 13.3  HCT 43.3  MCV 83.6  PLT 206    Basic Metabolic Panel: Recent Labs  Lab 03/16/24 1644 03/19/24 1352  NA 137 129*  K 4.3 4.1  CL 98 95*  CO2 26 21*  GLUCOSE 385* 684*  BUN 26* 30*  CREATININE 1.13 1.28*  CALCIUM 9.0 8.4*   CBG: Recent Labs  Lab 03/20/24 1200 03/20/24 1716 03/20/24 2319 03/21/24 0325 03/21/24 0859  GLUCAP 397* 213* 79 167* 325*    No results found for this or any previous visit (from the past 240 hours).   Radiology Studies: No results found.  Scheduled Meds:  dexamethasone  6 mg Oral BID   diltiazem  180 mg Oral Daily   finasteride  5 mg Oral Daily   insulin aspart  0-20 Units Subcutaneous TID WC   insulin aspart  0-5 Units Subcutaneous QHS   insulin aspart  12 Units Subcutaneous TID WC   insulin glargine-yfgn  25 Units Subcutaneous BID   levETIRAcetam  500 mg Oral BID   methadone  5 mg Oral TID   pantoprazole  40 mg Oral BID   QUEtiapine  50 mg Oral Q6H   senna  1 tablet Oral QHS   tamsulosin  0.4 mg Oral Daily   Continuous Infusions:   LOS: 0 days   Time spent: 55 mins  Laurynn Mccorvey Laural Benes, MD How to contact the [facility] Attending or Consulting provider 7A - 7P or covering provider during after hours 7P -7A, for this patient?  Check the care team in [facility] and look for a) attending/consulting TRH provider listed and b) the [facility] team listed Log  into [email or web address] to find provider on call.  Locate the [facility] provider you are looking for under [facility] and page to a number that you can be directly reached. If you still have difficulty reaching the provider, please page the [facility] (Director on Call) for the Hospitalists listed on amion for assistance.  03/21/2024, 9:53 AM

## 2024-03-21 NOTE — Progress Notes (Addendum)
 [facility] Liaison Note  New referral received by Jacob Moores, TOC through [facility] for hospice services at [facility].  Patient was prior [facility] hospice patient who revoked their hospice benefit on 3.22.25 as family could no longer provide care for Mr. Weekly.  DSS has been involved and plan is for patient to discharge to [facility] with hospice services starting back.  Referral sent to referral intake.  Planned discharge either today or tomorrow.  Hospital liaison team will continue to follow through final disposition. Thank you for allowing participation in this patient's care.  Norris Cross, RN Nurse Liaison [phone]

## 2024-03-21 NOTE — ED Notes (Signed)
 Pt provided sandwich.

## 2024-03-21 NOTE — ED Provider Notes (Signed)
 Emergency Medicine Observation Re-evaluation Note  Thomas Donovan is a 63 y.o. male, seen on rounds today.  Pt initially presented to the ED for complaints of Fall Currently, the patient is sleeping.  Physical Exam  BP 133/80   Pulse 88   Temp 98.4 F (36.9 C)   Resp 18   Ht 6\' 2"  (1.88 m)   Wt 112 kg   SpO2 98%   BMI 31.70 kg/m  Physical Exam General: NAD, sleeping Cardiac: Normal HR on recent vitals  Lungs: No respiratory distress   ED Course / MDM  EKG:EKG Interpretation Date/Time:  Sunday March 17 2024 21:34:33 EDT Ventricular Rate:  125 PR Interval:    QRS Duration:  83 QT Interval:  317 QTC Calculation: 458 R Axis:   41  Text Interpretation: Atrial fibrillation Since last tracing rate faster Confirmed by Jacalyn Lefevre [phone]) on 03/17/2024 11:10:59 PM  I have reviewed the labs performed to date as well as medications administered while in observation.  Recent changes in the last 24 hours include changes made by hospitalist in regards to DM management.  Plan  Current plan is for placement.    Durwin Glaze, MD 03/21/24 [phone]

## 2024-03-22 DIAGNOSIS — C61 Malignant neoplasm of prostate: Secondary | ICD-10-CM

## 2024-03-22 DIAGNOSIS — R519 Headache, unspecified: Secondary | ICD-10-CM | POA: Diagnosis not present

## 2024-03-22 DIAGNOSIS — R296 Repeated falls: Secondary | ICD-10-CM

## 2024-03-22 DIAGNOSIS — R739 Hyperglycemia, unspecified: Secondary | ICD-10-CM

## 2024-03-22 DIAGNOSIS — G9340 Encephalopathy, unspecified: Secondary | ICD-10-CM | POA: Diagnosis not present

## 2024-03-22 DIAGNOSIS — D496 Neoplasm of unspecified behavior of brain: Secondary | ICD-10-CM | POA: Diagnosis not present

## 2024-03-22 DIAGNOSIS — T380X5A Adverse effect of glucocorticoids and synthetic analogues, initial encounter: Secondary | ICD-10-CM

## 2024-03-22 LAB — CBG MONITORING, ED
Glucose-Capillary: 226 mg/dL — ABNORMAL HIGH (ref 70–99)
Glucose-Capillary: 310 mg/dL — ABNORMAL HIGH (ref 70–99)
Glucose-Capillary: 314 mg/dL — ABNORMAL HIGH (ref 70–99)
Glucose-Capillary: 360 mg/dL — ABNORMAL HIGH (ref 70–99)

## 2024-03-22 MED ORDER — LORAZEPAM 1 MG PO TABS: 1.0000 mg | ORAL_TABLET | Freq: Four times a day (QID) | ORAL | 0 refills | Status: DC | PRN

## 2024-03-22 MED ORDER — QUETIAPINE FUMARATE 50 MG PO TABS: 50.0000 mg | ORAL_TABLET | Freq: Four times a day (QID) | ORAL | Status: DC

## 2024-03-22 MED ORDER — INSULIN GLARGINE 100 UNIT/ML SOLOSTAR PEN: 45.0000 [IU] | PEN_INJECTOR | Freq: Every day | SUBCUTANEOUS | Status: DC

## 2024-03-22 MED ORDER — INSULIN ASPART 100 UNIT/ML IJ SOLN
0.0000 [IU] | Freq: Three times a day (TID) | INTRAMUSCULAR | Status: DC
Start: 2024-03-22 — End: 2024-03-22

## 2024-03-22 MED ORDER — INSULIN ASPART 100 UNIT/ML IJ SOLN: 15.0000 [IU] | Freq: Three times a day (TID) | INTRAMUSCULAR | Status: DC

## 2024-03-22 MED ORDER — METHADONE HCL 5 MG PO TABS: 5.0000 mg | ORAL_TABLET | Freq: Three times a day (TID) | ORAL | 0 refills | Status: DC

## 2024-03-22 MED ORDER — INSULIN ASPART 100 UNIT/ML IJ SOLN
0.0000 [IU] | Freq: Three times a day (TID) | INTRAMUSCULAR | Status: DC
Start: 2024-03-22 — End: 2024-04-11

## 2024-03-22 MED ORDER — OXYCODONE HCL 10 MG PO TABS: 10.0000 mg | ORAL_TABLET | ORAL | 0 refills | Status: DC | PRN

## 2024-03-22 MED ORDER — METHADONE HCL 5 MG PO TABS: 5.0000 mg | ORAL_TABLET | Freq: Three times a day (TID) | ORAL | 0 refills | Status: AC

## 2024-03-22 MED ORDER — INSULIN ASPART 100 UNIT/ML IJ SOLN
0.0000 [IU] | Freq: Every day | INTRAMUSCULAR | Status: DC
Start: 2024-03-22 — End: 2024-03-22

## 2024-03-22 MED ORDER — INSULIN ASPART 100 UNIT/ML IJ SOLN: 0.0000 [IU] | Freq: Every day | INTRAMUSCULAR | Status: DC

## 2024-03-22 NOTE — ED Notes (Signed)
 Pt spilled urinal Pt bed changed, and personal clothing removed Pt given bed- bath Condom cath applied

## 2024-03-22 NOTE — ED Notes (Signed)
Pt had a large BM on bedside commode

## 2024-03-22 NOTE — Inpatient Diabetes Management (Signed)
 Inpatient Diabetes Program Recommendations  AACE/ADA: New Consensus Statement on Inpatient Glycemic Control   Target Ranges:  Prepandial:   less than 140 mg/dL      Peak postprandial:   less than 180 mg/dL (1-2 hours)      Critically ill patients:  140 - 180 mg/dL    Latest Reference Range & Units 03/21/24 08:59 03/21/24 11:38 03/21/24 17:38 03/21/24 22:04 03/22/24 03:57 03/22/24 08:13  Glucose-Capillary 70 - 99 mg/dL 098 (H) 119 (H) 147 (H) 134 (H) 226 (H) 310 (H)   Review of Glycemic Control  Diabetes history: DM2 Outpatient Diabetes medications: Lantus 26 units daily; Decadron 6 mg BID Current orders for Inpatient glycemic control: Semglee 25 units BID,  Novolog 0-20 units TID with meals, Novolog 0-5 units at bedtime, Novolog 12 units TID with meals; Decadron 6 mg BID  Inpatient Diabetes Program Recommendations:    Insulin: Please consider changing Semglee to 30 units QAM and Semglee 25 units at bedtime, and increasing meal coverage to Novolog 16 units TID with meals.  Thanks, Orlando Penner, RN, MSN, [facility] Diabetes Coordinator Inpatient Diabetes Program [phone] (Team Pager from 8am to 5pm)

## 2024-03-22 NOTE — ED Provider Notes (Signed)
 Emergency Medicine Observation Re-evaluation Note  Thomas Donovan is a 63 y.o. male, seen on rounds today.  Pt initially presented to the ED for complaints of Fall Currently, the patient is sleeping quietly.  Physical Exam  BP (!) 168/98 (BP Location: Right Arm)   Pulse 98   Temp 97.8 F (36.6 C) (Oral)   Resp 18   Ht 6\' 2"  (1.88 m)   Wt 112 kg   SpO2 99%   BMI 31.70 kg/m  Physical Exam General: No acute distress Cardiac: Well-perfused Lungs: Nonlabored Psych: Calm  ED Course / MDM  EKG:EKG Interpretation Date/Time:  Sunday March 17 2024 21:34:33 EDT Ventricular Rate:  125 PR Interval:    QRS Duration:  83 QT Interval:  317 QTC Calculation: 458 R Axis:   41  Text Interpretation: Atrial fibrillation Since last tracing rate faster Confirmed by Jacalyn Lefevre [phone]) on 03/17/2024 11:10:59 PM  I have reviewed the labs performed to date as well as medications administered while in observation.  Recent changes in the last 24 hours include medical evaluation.  Patient is also been seen by social work.  Plan  Current plan is for possible discharge to hospice.    Terrilee Files, MD 03/22/24 [phone]

## 2024-03-22 NOTE — ED Notes (Signed)
 Patient is resting comfortably.

## 2024-03-22 NOTE — Discharge Summary (Signed)
 Physician Discharge Summary  Thomas Donovan:[id number] DOB: [birth year] DOA: 03/16/2024   Admit date: 03/16/2024 Discharge date: 03/22/2024  Admitted From:  HOME  Disposition:  SNF [facility])   Recommendations for Outpatient Follow-up:  Please check blood sugars at least 5 times per day Please titrate insulin doses as needed for better glucose control Pt is terminally ill with incurable metastatic cancer.   Please call hospice RN first before calling EMS or returning to ED  Pt might have seizures due to brain tumors, treat supportively FALL PRECAUTIONS Recommended   Discharge Condition: HOSPICE   CODE STATUS: DNR  DIET: Regular, Comfort Foods; avoid concentrated sweets or fruit juices except to treat a low blood glucose   Brief Hospitalization Summary: Please see all hospital notes, images, labs for full details of the hospitalization. 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  He had been evaluated by neurosurgery, oncology, neurology and palliative medicine at [facility] and the recommendation was for palliative care due to advanced, highly aggressive metastatic prostate cancer with rapid progression and determined to be incurable.  Unfortunately after being at home family brought him back to the emergency department because they have been unable to care for him at home due to his extensive skilled care needs.  They apparently decided to revoke hospice benefits and seek out SNF placement.  Patient has been holding in the emergency department for SNF placement while TOC is working to find him a bed.  Because he has been  on Decadron for managing vasogenic brain edema he has been experiencing steroid exacerbated hyperglycemia.  The ED providers have requested a medical consultation to assist with glycemic management while he is waiting for a SNF bed for placement.     Assessment and Plan:   Uncontrolled type 2 diabetes mellitus, insulin requiring with steroid-induced hyperglycemia - DC Glipizide - increased glargine dosing to 45 units daily  - novolog 15 units TID with meals eaten more than 50%  - check CBG 5 times daily  - try to avoid concentrated sweets or fruit juices except to treat a low blood glucose - given that he is end of life/hospice, ok with continuing regular diet for comfort and allow snacks as he wants - expect that given his advancing brain tumor and aggressive disease he will not be able to come off dexamethasone  CBG (last 3)  Recent Labs    03/22/24 0357 03/22/24 0813 03/22/24 1040  GLUCAP 226* 310* 314*    Metabolic encephalopathy -Secondary to brain mass with vasogenic edema -Uncontrolled hyperglycemia -Seizure disorder  -Recently seen by neurology and placed on Keppra and Decadron   History of PAF with RVR -He has been restarted on home diltiazem -Anticoagulation held due to brain metastasis   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 Brain mass with vasogenic edema -Continue Decadron and Keppra as above -Patient has incurable disease -Patient not a candidate for surgical interventions per neurosurgeon -Pain management and symptom management as ordered   Essential hypertension -Resumed home Cardizem CD   Frequent Falls, Gait Instability - Fall precautions - Pt arranged for SNF placement   Discharge Diagnoses:  Active Problems:   Steroid-induced hyperglycemia   Tobacco use  Paroxysmal atrial fibrillation with RVR (HCC)   OSA on CPAP   Brain tumor (HCC)   Acute encephalopathy   Prostate cancer metastatic to multiple sites [facility])   Gait  instability   Falls frequently   Essential (primary) hypertension  Discharge Instructions:  Allergies as of 03/22/2024       Reactions   Celebrex [celecoxib] Anaphylaxis   Guaifenesin Anaphylaxis   Ibuprofen Hives, Other (See Comments)   GI bleeding OTC NSAIDS cause blood in stools   Tramadol Hives, Rash   Acetaminophen Other (See Comments)   Liver issues   Aspirin Other (See Comments)   Pt was told not to take by MD due to being on Colchicine.   Ketorolac Hives   Blisters between fingers   Toradol [ketorolac Tromethamine] Other (See Comments)   Blisters between fingers         Medication List     TAKE these medications    dexamethasone 6 MG tablet Commonly known as: DECADRON Take 1 tablet (6 mg total) by mouth 2 (two) times daily.   diltiazem 180 MG 24 hr capsule Commonly known as: CARDIZEM CD Take 1 capsule (180 mg total) by mouth daily.   finasteride 5 MG tablet Commonly known as: PROSCAR Take 1 tablet (5 mg total) by mouth daily.   insulin aspart 100 UNIT/ML injection Commonly known as: novoLOG Inject 0-5 Units into the skin at bedtime.   insulin aspart 100 UNIT/ML injection Commonly known as: novoLOG Inject 0-20 Units into the skin 3 (three) times daily with meals.   insulin aspart 100 UNIT/ML injection Commonly known as: novoLOG Inject 15 Units into the skin 3 (three) times daily with meals. GIVE ONLY IF EATS 50% OR MORE OF MEAL.   insulin glargine 100 UNIT/ML Solostar Pen Commonly known as: LANTUS Inject 45 Units into the skin daily. Start taking on: March 23, 2024 What changed: how much to take   levETIRAcetam 500 MG tablet Commonly known as: KEPPRA Take 1 tablet (500 mg total) by mouth 2 (two) times daily.   LORazepam 1 MG tablet Commonly known as: Ativan Take 1 tablet (1 mg total) by mouth every 6 (six) hours as needed for anxiety or seizure.   methadone 5 MG tablet Commonly known as: DOLOPHINE Take 1 tablet (5 mg total) by mouth 3  (three) times daily.   ondansetron 4 MG tablet Commonly known as: ZOFRAN Take 1 tablet (4 mg total) by mouth every 6 (six) hours as needed for nausea. What changed: reasons to take this   Oxycodone HCl 10 MG Tabs Take 1 tablet (10 mg total) by mouth every 4 (four) hours as needed (breakthrough pain). What changed: reasons to take this   pantoprazole 40 MG tablet Commonly known as: PROTONIX Take 1 tablet (40 mg total) by mouth 2 (two) times daily.   QUEtiapine 50 MG tablet Commonly known as: SEROQUEL Take 1 tablet (50 mg total) by mouth every 6 (six) hours. What changed: how much to take   senna 8.6 MG Tabs tablet Commonly known as: SENOKOT Take 1 tablet by mouth in the morning and at bedtime.   tamsulosin 0.4 MG Caps capsule Commonly known as: FLOMAX Take 1 capsule (0.4 mg total) by mouth daily.        Contact information for after-discharge care     Destination     [facility] .   Service: Skilled Paramedic information: [address] [place] [address] [phone]  Allergies  Allergen Reactions   Celebrex [Celecoxib] Anaphylaxis   Guaifenesin Anaphylaxis   Ibuprofen Hives and Other (See Comments)    GI bleeding OTC NSAIDS cause blood in stools   Tramadol Hives and Rash   Acetaminophen Other (See Comments)    Liver issues   Aspirin Other (See Comments)    Pt was told not to take by MD due to being on Colchicine.   Ketorolac Hives    Blisters between fingers   Toradol [Ketorolac Tromethamine] Other (See Comments)    Blisters between fingers    Allergies as of 03/22/2024       Reactions   Celebrex [celecoxib] Anaphylaxis   Guaifenesin Anaphylaxis   Ibuprofen Hives, Other (See Comments)   GI bleeding OTC NSAIDS cause blood in stools   Tramadol Hives, Rash   Acetaminophen Other (See Comments)   Liver issues   Aspirin Other (See Comments)   Pt was told not to take by MD due to being on  Colchicine.   Ketorolac Hives   Blisters between fingers   Toradol [ketorolac Tromethamine] Other (See Comments)   Blisters between fingers         Medication List     TAKE these medications    dexamethasone 6 MG tablet Commonly known as: DECADRON Take 1 tablet (6 mg total) by mouth 2 (two) times daily.   diltiazem 180 MG 24 hr capsule Commonly known as: CARDIZEM CD Take 1 capsule (180 mg total) by mouth daily.   finasteride 5 MG tablet Commonly known as: PROSCAR Take 1 tablet (5 mg total) by mouth daily.   insulin aspart 100 UNIT/ML injection Commonly known as: novoLOG Inject 0-5 Units into the skin at bedtime.   insulin aspart 100 UNIT/ML injection Commonly known as: novoLOG Inject 0-20 Units into the skin 3 (three) times daily with meals.   insulin aspart 100 UNIT/ML injection Commonly known as: novoLOG Inject 15 Units into the skin 3 (three) times daily with meals. GIVE ONLY IF EATS 50% OR MORE OF MEAL.   insulin glargine 100 UNIT/ML Solostar Pen Commonly known as: LANTUS Inject 45 Units into the skin daily. Start taking on: March 23, 2024 What changed: how much to take   levETIRAcetam 500 MG tablet Commonly known as: KEPPRA Take 1 tablet (500 mg total) by mouth 2 (two) times daily.   LORazepam 1 MG tablet Commonly known as: Ativan Take 1 tablet (1 mg total) by mouth every 6 (six) hours as needed for anxiety or seizure.   methadone 5 MG tablet Commonly known as: DOLOPHINE Take 1 tablet (5 mg total) by mouth 3 (three) times daily.   ondansetron 4 MG tablet Commonly known as: ZOFRAN Take 1 tablet (4 mg total) by mouth every 6 (six) hours as needed for nausea. What changed: reasons to take this   Oxycodone HCl 10 MG Tabs Take 1 tablet (10 mg total) by mouth every 4 (four) hours as needed (breakthrough pain). What changed: reasons to take this   pantoprazole 40 MG tablet Commonly known as: PROTONIX Take 1 tablet (40 mg total) by mouth 2 (two) times  daily.   QUEtiapine 50 MG tablet Commonly known as: SEROQUEL Take 1 tablet (50 mg total) by mouth every 6 (six) hours. What changed: how much to take   senna 8.6 MG Tabs tablet Commonly known as: SENOKOT Take 1 tablet by mouth in the morning and at bedtime.   tamsulosin 0.4 MG Caps capsule Commonly known as: FLOMAX Take 1 capsule (  0.4 mg total) by mouth daily.        Procedures/Studies: DG Chest Portable 1 View Result Date: 03/16/2024 CLINICAL DATA:  Leukocytosis.  Hospice for brain cancer. EXAM: PORTABLE CHEST 1 VIEW COMPARISON:  02/26/2024 FINDINGS: Apical lordotic positioning. Midline trachea. Mild cardiomegaly. No pleural effusion or pneumothorax. No congestive failure. Degenerative changes of the right glenohumeral joint. IMPRESSION: Cardiomegaly without congestive failure. Electronically Signed   By: Jeronimo Greaves M.D.   On: 03/16/2024 18:55   CT Head Wo Contrast Result Date: 03/16/2024 CLINICAL DATA:  Known skull base tumor. Fall today. Confusion at baseline. EXAM: CT HEAD WITHOUT CONTRAST TECHNIQUE: Contiguous axial images were obtained from the base of the skull through the vertex without intravenous contrast. RADIATION DOSE REDUCTION: This exam was performed according to the departmental dose-optimization program which includes automated exposure control, adjustment of the mA and/or kV according to patient size and/or use of iterative reconstruction technique. COMPARISON:  MR head without and with contrast 02/27/2024. CT head without contrast 02/26/2024. FINDINGS: Brain: Heterogeneous mass in the left middle cranial fossa is again noted. Marked vasogenic edema throughout the left temporal, posterior frontal and parietal lobe is similar to the prior studies. Mass effect is again noted. Midline shift is 7 mm. No new hemorrhage or infarct is present. Deep brain nuclei are within normal limits. Left ventricle is near completely effaced. Right ventricle is stable. The brainstem and  cerebellum are within normal limits. Midline structures are otherwise unremarkable. Vascular: No hyperdense vessel or unexpected calcification. Skull: No changes at the left middle cranial fossa are again noted, associated with the tumor. Expansion of the expansion of the left pterygoid palatine fossa is again noted. The calvarium is otherwise intact. No acute fractures are present. No focal soft tissue injury is present. Sinuses/Orbits: Left sphenoid sinus is near completely opacified. The paranasal sinuses and mastoid air cells are otherwise clear. The globes and orbits are within normal limits. IMPRESSION: 1. Stable appearance of heterogeneous mass in the left middle cranial fossa with marked vasogenic edema throughout the left temporal, posterior frontal and parietal lobe. 2. Stable mass effect with 7 mm of midline shift. 3. No new hemorrhage or infarct. 4. No acute fracture or focal soft tissue injury. 5. Left sphenoid sinus disease. Electronically Signed   By: Marin Roberts M.D.   On: 03/16/2024 14:30   CT CHEST ABDOMEN PELVIS W CONTRAST Result Date: 02/28/2024 CLINICAL DATA:  Brain metastases, unknown primary * Tracking Code: BO * EXAM: CT CHEST, ABDOMEN, AND PELVIS WITH CONTRAST TECHNIQUE: Multidetector CT imaging of the chest, abdomen and pelvis was performed following the standard protocol during bolus administration of intravenous contrast. RADIATION DOSE REDUCTION: This exam was performed according to the departmental dose-optimization program which includes automated exposure control, adjustment of the mA and/or kV according to patient size and/or use of iterative reconstruction technique. CONTRAST:  75mL OMNIPAQUE IOHEXOL 350 MG/ML SOLN COMPARISON:  CT abdomen pelvis, 12/18/2023 FINDINGS: CT CHEST FINDINGS Cardiovascular: No significant extracardiac vascular findings. Normal heart size. Scattered left coronary artery calcifications no pericardial effusion. Mediastinum/Nodes: No enlarged  mediastinal, hilar, or axillary lymph nodes. Thyroid gland, trachea, and esophagus demonstrate no significant findings. Lungs/Pleura: Minimal paraseptal emphysema. Diffuse bilateral bronchial wall thickening. Background of fine centrilobular pulmonary nodules, most concentrated in the lung apices. Mild, bandlike bibasilar scarring. No pleural effusion or pneumothorax. Musculoskeletal: No chest wall abnormality. No acute osseous findings. CT ABDOMEN PELVIS FINDINGS Hepatobiliary: Hepatomegaly, maximum coronal span 20.4 cm. New hypodense lesion of the posterior liver dome,  hepatic segment VII, measuring 1.5 x 0.1 cm (series 3, image 50). No gallstones, gallbladder wall thickening, or biliary dilatation. Pancreas: Unremarkable. No pancreatic ductal dilatation or surrounding inflammatory changes. Spleen: Normal in size without significant abnormality. Adrenals/Urinary Tract: Adrenal glands are unremarkable. Simple, benign renal cortical cysts, for which no further follow-up or characterization is required. Kidneys are otherwise normal, without obvious renal calculi, solid lesion, or hydronephrosis. Bladder is unremarkable. Stomach/Bowel: Stomach is within normal limits. Appendix not clearly visualized. No evidence of bowel wall thickening, distention, or inflammatory changes. Sigmoid diverticulosis. Vascular/Lymphatic: No significant vascular findings are present. No enlarged abdominal or pelvic lymph nodes. Reproductive: Interval enlargement of a soft tissue mass interposed between the rectum and prostate, measuring 6.5 x 4.1 cm, previously 4.6 x 2.7 cm when measured with similar technique (series 3, image 121). There is otherwise mild prostatomegaly. Other: Small fat containing bilateral inguinal hernias. Mild anasarca. No ascites. Musculoskeletal: No acute osseous findings. Disc degenerative disease and bridging osteophytosis throughout the thoracic and lumbar spine, in keeping with advanced DISH. IMPRESSION: 1.  Interval enlargement of a soft tissue mass interposed between the rectum and prostate, measuring 6.5 x 4.1 cm, previously 4.6 x 2.7 cm when measured with similar technique. Reportedly, patient carries a known diagnosis of prostate malignancy. 2. New hypodense lesion of the posterior liver dome, hepatic segment VII, measuring 1.5 x 1.1 cm, consistent with a small hepatic metastasis. 3. No other evidence of lymphadenopathy or metastatic disease in the chest, abdomen, or pelvis. 4. Emphysema and smoking-related respiratory bronchiolitis. 5. Coronary artery disease. Emphysema (ICD10-J43.9). Electronically Signed   By: Jearld Lesch M.D.   On: 02/28/2024 20:02   EEG adult Result Date: 02/27/2024 Charlsie Quest, MD     02/27/2024 10:30 AM Patient Name: TRISTON SKARE MRN: [id number] Epilepsy Attending: Charlsie Quest Referring Physician/Provider: Caryl Pina, MD Date: 02/27/2024 Duration: 27.35 mins Patient history: 63yo M with ams. EEG to evaluate for seizure Level of alertness: Asleep AEDs during EEG study: LEV, Dexamethasone, Ativan Technical aspects: This EEG study was done with scalp electrodes positioned according to the 10-20 International system of electrode placement. Electrical activity was reviewed with band pass filter of 1-70Hz , sensitivity of 7 uV/mm, display speed of 58mm/sec with a 60Hz  notched filter applied as appropriate. EEG data were recorded continuously and digitally stored.  Video monitoring was available and reviewed as appropriate. Description: Sleep was characterized by sleep spindles (12 to 14 Hz), maximal frontocentral region. Hyperventilation and photic stimulation were not performed.   IMPRESSION: This study during sleep only is within normal limits. No seizures or epileptiform discharges were seen throughout the recording.  If suspicion for ictal- interictal activity remains a concern, a prolonged study can be considered. Charlsie Quest   MR BRAIN W WO CONTRAST Result Date:  02/27/2024 CLINICAL DATA:  63 year old male with altered mental status. Unwitnessed fall. Left MCA infarct versus tumor on head CT/CTA. EXAM: MRI HEAD WITHOUT AND WITH CONTRAST TECHNIQUE: Multiplanar, multiecho pulse sequences of the brain and surrounding structures were obtained without and with intravenous contrast. CONTRAST:  10mL GADAVIST GADOBUTROL 1 MMOL/ML IV SOLN COMPARISON:  Head CT yesterday, CTA head and neck 0047 hours today. FINDINGS: Brain: Large round and lobulated, intensely enhancing left middle cranial fossa mass. The enhancing component encompasses 42 x 45 x 41 mm and is inseparable from the left cavernous sinus (series 8, image 21). This was largely isodense by CT. Extensive tumoral edema in the left hemisphere with mostly a vasogenic pattern including  tracking into all deep white matter capsules, the deep gray nuclei. Along the posterosuperior margin of the dominant enhancing component is a confluent area of hemosiderin and wagon-wheel-spoke type appearance on precontrast T2, which is partially enhancing postcontrast (series 4, image 26). Initial coronal T2 weighted images at 0317 hours today were motion degraded but repeat coronal T2 at 0853 hours, series 6, images 15 and 16 suggest a CSF gap between the enhancing component of the mass and adjacent left temporal lobe. And furthermore, there is strong evidence of tumor invasion through the bone of the left middle cranial fossa, into the left infratemporal fossa with solid enhancing tumor there (series 3, image 19 versus series 9, image 17) and also into the posterior left orbit (series 4, image 22). Sclerotic and mildly hyperostotic bone in these areas by CT. And there is abnormal signal in the left pterygoid and temporalis muscles. Therefore, all told the bulky left middle cranial fossa tumor is at least 62 x 56 by 69 mm (AP by transverse by CC). Furthermore there is an expansile tumor of the right mandible ramus and condyle (series 3 image 4  and series 9, image 4 estimated at 34 mm diameter and abnormally enhancing. Right mandible condyle permeated and partially destroyed by CT. Associated intracranial mass effect with rightward midline shift of 10 mm and subtotal effacement of the left lateral ventricle. No ventriculomegaly. Basilar cisterns remain patent. No restricted diffusion suggestive of infarction. No other cerebral edema identified. No other cerebral blood products identified. Vascular: Major intracranial vascular flow voids are preserved. There is mass effect on the left ICA terminus, left MCA branches. Skull and upper cervical spine: Background bone marrow signal is within normal limits. Grossly negative visible cervical spine. Sinuses/Orbits: Infiltrative tumor in the posterior left orbit, along the left lateral wall of that orbit. Underlying abnormal bone. No significant exophthalmos. No other abnormal intraorbital mass or enhancement is evident. But there is also tumor infiltration of the left sphenoid and maxillary sinuses, also confirmed by CT. Other: Mastoids remain clear. Grossly negative visible internal auditory structures. IMPRESSION: 1. Motion degraded exam. 2. 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 Estimated tumor long-axis nearly 7 cm. Confluent enhancing intracranial component is 4-5 cm. AND a second similar bony destructive and expansile enhancing Tumor of the Right Mandible Condyle is 3-4 cm. 3. Multiplicity and constellation of imaging findings favors Metastases over other considerations such as invasive Meningioma, high-grade glioma, or Lymphoma. 4. Extensive probably vasogenic tumoral edema in the left cerebral hemisphere with intracranial mass effect including rightward midline shift of 10 mm. Basilar cisterns remain patent. No evidence  of acute cerebral infarct. Electronically Signed   By: Odessa Fleming M.D.   On: 02/27/2024 09:45   DG Femur Portable 1 View Right Result Date: 02/27/2024 CLINICAL DATA:  MRI clearance EXAM: RIGHT FEMUR PORTABLE 1 VIEW COMPARISON:  None Available. FINDINGS: There is no evidence of fracture or other focal bone lesions. Soft tissues are unremarkable. No radiopaque foreign body is noted. IMPRESSION: No acute abnormality noted. Electronically Signed   By: Alcide Clever M.D.   On: 02/27/2024 03:41   DG Femur Portable 1 View Left Result Date: 02/27/2024 CLINICAL DATA:  MRI clearance EXAM: LEFT FEMUR PORTABLE 1 VIEW COMPARISON:  None Available. FINDINGS: There is no evidence of fracture or other focal bone lesions. Soft tissues are  unremarkable. No foreign body seen. IMPRESSION: No acute abnormality noted. Electronically Signed   By: Alcide Clever M.D.   On: 02/27/2024 03:41   DG Humerus Right Result Date: 02/27/2024 CLINICAL DATA:  MRI screening EXAM: RIGHT HUMERUS - 2+ VIEW COMPARISON:  None Available. FINDINGS: No acute fracture is noted. A needle is noted overlying the right arm likely extrinsic to the patient. No other focal abnormality is noted. IMPRESSION: Needle is noted over the mid upper arm likely extrinsic to the patient. Correlate with the physical exam. Electronically Signed   By: Alcide Clever M.D.   On: 02/27/2024 03:41   DG Humerus Left Result Date: 02/27/2024 CLINICAL DATA:  Screening for MRI EXAM: LEFT HUMERUS - 2+ VIEW COMPARISON:  None Available. FINDINGS: No acute fractures noted. No radiopaque foreign body is noted. Degenerative changes of the acromioclavicular joint are seen. IMPRESSION: No foreign body noted. Electronically Signed   By: Alcide Clever M.D.   On: 02/27/2024 03:39   CT ANGIO HEAD NECK W WO CM Result Date: 02/27/2024 CLINICAL DATA:  Stroke, follow up EXAM: CT ANGIOGRAPHY HEAD AND NECK WITH AND WITHOUT CONTRAST TECHNIQUE: Multidetector CT imaging of the head and neck was performed using  the standard protocol during bolus administration of intravenous contrast. Multiplanar CT image reconstructions and MIPs were obtained to evaluate the vascular anatomy. Carotid stenosis measurements (when applicable) are obtained utilizing NASCET criteria, using the distal internal carotid diameter as the denominator. RADIATION DOSE REDUCTION: This exam was performed according to the departmental dose-optimization program which includes automated exposure control, adjustment of the mA and/or kV according to patient size and/or use of iterative reconstruction technique. CONTRAST:  75mL OMNIPAQUE IOHEXOL 350 MG/ML SOLN COMPARISON:  None Available. FINDINGS: CTA NECK FINDINGS Aortic arch: Great vessel origins are patent without significant stenosis. Right carotid system: No evidence of dissection, stenosis (50% or greater), or occlusion. Left carotid system: No evidence of dissection, stenosis (50% or greater), or occlusion. Vertebral arteries: Left dominant. No evidence of dissection, stenosis (50% or greater), or occlusion. Skeleton: No acute abnormality on limited assessment. Other neck: No acute abnormality on limited assessment. Upper chest: Visualized lung apices are clear. 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 Review of the MIP images confirms the above findings CTA HEAD FINDINGS Anterior circulation: No significant stenosis, proximal occlusion, aneurysm, or vascular malformation. Posterior circulation: No significant stenosis, proximal occlusion, aneurysm, or vascular malformation. Venous sinuses: As permitted by contrast timing, patent. Review of the MIP images confirms the above findings IMPRESSION: 1. Although incompletely assessed on this study, suspected enhancing mass in the anterior left temporal lobe which likely is the cause of the patient's extensive left  cerebral edema. Recommend MRI of the head with contrast to confirm and further evaluate. 2. No large vessel occlusion or proximal hemodynamically significant stenosis. Findings discussed with Dr. Blinda Leatherwood via telephone at 2:44 a.m. Electronically Signed   By: Feliberto Harts M.D.   On: 02/27/2024 02:45   DG Chest Port 1 View Result Date: 02/26/2024 CLINICAL DATA:  Altered mental status. EXAM: PORTABLE CHEST 1 VIEW COMPARISON:  January 24, 2024 FINDINGS: The heart size and mediastinal contours are within normal limits. Both lungs are clear. Chronic and degenerative changes are seen involving the right shoulder. The visualized skeletal structures are otherwise unremarkable. IMPRESSION: No active disease. Electronically Signed   By: Aram Candela M.D.   On:  02/26/2024 23:05   CT Cervical Spine Wo Contrast Result Date: 02/26/2024 CLINICAL DATA:  Unwitnessed fall. EXAM: CT CERVICAL SPINE WITHOUT CONTRAST TECHNIQUE: Multidetector CT imaging of the cervical spine was performed without intravenous contrast. Multiplanar CT image reconstructions were also generated. RADIATION DOSE REDUCTION: This exam was performed according to the departmental dose-optimization program which includes automated exposure control, adjustment of the mA and/or kV according to patient size and/or use of iterative reconstruction technique. COMPARISON:  None Available. FINDINGS: Alignment: Normal. Skull base and vertebrae: No acute fracture. No primary bone lesion or focal pathologic process. Soft tissues and spinal canal: No prevertebral fluid or swelling. No visible canal hematoma. Disc levels: Moderate to marked severity endplate sclerosis, anterior osteophyte formation and posterior bony spurring are seen at the level of C5-C6. Mild multilevel endplate sclerosis is seen throughout the remainder of the cervical spine. Mild intervertebral disc space narrowing is seen at the levels of C5-C6 and C6-C7. Bilateral moderate to marked severity  multilevel facet joint hypertrophy is noted, right greater than left. Upper chest: Negative. Other: A 9 mm diameter thyroid nodule is seen within the left lobe of the thyroid gland. IMPRESSION: 1. No acute fracture or subluxation in the cervical spine. 2. Moderate to marked severity degenerative changes, most prominent at the level of C5-C6. 3. 9 mm diameter thyroid nodule within the left lobe of the thyroid gland. No follow-up imaging is recommended. This follows ACR consensus guidelines: Managing Incidental Thyroid Nodules Detected on Imaging: White Paper of the ACR Incidental Thyroid Findings Committee. J Am Coll Radiol 2015; 12:143-150. Electronically Signed   By: Aram Candela M.D.   On: 02/26/2024 23:04   CT Head Wo Contrast Result Date: 02/26/2024 CLINICAL DATA:  Unwitnessed fall. EXAM: CT HEAD WITHOUT CONTRAST TECHNIQUE: Contiguous axial images were obtained from the base of the skull through the vertex without intravenous contrast. RADIATION DOSE REDUCTION: This exam was performed according to the departmental dose-optimization program which includes automated exposure control, adjustment of the mA and/or kV according to patient size and/or use of iterative reconstruction technique. COMPARISON:  None Available. FINDINGS: Brain: A large area of cortical and white matter low attenuation is seen throughout the entire left parietal lobe. There is associated mass effect on the adjacent sulci and left lateral ventricle with approximate 7 mm left-to-right midline shift. No evidence of hemorrhage, hydrocephalus or extra-axial collection or mass lesion. Vascular: No hyperdense vessel or unexpected calcification. Skull: Normal. Negative for fracture or focal lesion. Sinuses/Orbits: There is mild left maxillary sinus, left-sided sphenoid sinus and left ethmoid sinus mucosal thickening. Other: None. IMPRESSION: 1. 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 MRI  correlation is recommended. 2. Associated mass effect on the adjacent sulci and left lateral ventricle with approximate 7 mm left-to-right midline shift. 3. Mild left maxillary sinus, left-sided sphenoid sinus and left ethmoid sinus disease. Electronically Signed   By: Aram Candela M.D.   On: 02/26/2024 23:00     Subjective: Pt reports that he has a good appetite and feeling well. He is eager to get out of ED and agreeable to going to [facility].    Discharge Exam: Vitals:   03/21/24 2233 03/22/24 0634  BP: (!) 173/103 (!) 168/98  Pulse: (!) 102 98  Resp: 19 18  Temp: 97.6 F (36.4 C) 97.8 F (36.6 C)  SpO2: 98% 99%   Vitals:   03/20/24 1427 03/20/24 2330 03/21/24 2233 03/22/24 0634  BP: (!) 147/82 133/80 (!) 173/103 Marland Kitchen)  168/98  Pulse: 68 88 (!) 102 98  Resp: 18 18 19 18   Temp: 98.6 F (37 C) 98.4 F (36.9 C) 97.6 F (36.4 C) 97.8 F (36.6 C)  TempSrc: Oral  Oral Oral  SpO2: 100% 98% 98% 99%  Weight:      Height:       General: Pt is alert, awake, not in acute distress Cardiovascular: normal S1/S2 +, no rubs, no gallops Respiratory: CTA bilaterally, no wheezing, no rhonchi Abdominal: Soft, NT, ND, bowel sounds + Extremities: no edema, no cyanosis   The results of significant diagnostics from this hospitalization (including imaging, microbiology, ancillary and laboratory) are listed below for reference.     Microbiology: No results found for this or any previous visit (from the past 240 hours).   Labs: BNP (last 3 results) Recent Labs    09/18/23 0942 11/28/23 1000 12/06/23 1049  BNP 158.0* 197.0* 122.0*   Basic Metabolic Panel: Recent Labs  Lab 03/16/24 1644 03/19/24 1352  NA 137 129*  K 4.3 4.1  CL 98 95*  CO2 26 21*  GLUCOSE 385* 684*  BUN 26* 30*  CREATININE 1.13 1.28*  CALCIUM 9.0 8.4*   Liver Function Tests: No results for input(s): "AST", "ALT", "ALKPHOS", "BILITOT", "PROT", "ALBUMIN" in the last 168 hours. No results for input(s):  "LIPASE", "AMYLASE" in the last 168 hours. No results for input(s): "AMMONIA" in the last 168 hours. CBC: Recent Labs  Lab 03/16/24 1644  WBC 15.7*  NEUTROABS 14.2*  HGB 13.3  HCT 43.3  MCV 83.6  PLT 206   Cardiac Enzymes: No results for input(s): "CKTOTAL", "CKMB", "CKMBINDEX", "TROPONINI" in the last 168 hours. BNP: Invalid input(s): "POCBNP" CBG: Recent Labs  Lab 03/21/24 1738 03/21/24 2204 03/22/24 0357 03/22/24 0813 03/22/24 1040  GLUCAP 133* 134* 226* 310* 314*   D-Dimer No results for input(s): "DDIMER" in the last 72 hours. Hgb A1c No results for input(s): "HGBA1C" in the last 72 hours. Lipid Profile No results for input(s): "CHOL", "HDL", "LDLCALC", "TRIG", "CHOLHDL", "LDLDIRECT" in the last 72 hours. Thyroid function studies No results for input(s): "TSH", "T4TOTAL", "T3FREE", "THYROIDAB" in the last 72 hours.  Invalid input(s): "FREET3" Anemia work up No results for input(s): "VITAMINB12", "FOLATE", "FERRITIN", "TIBC", "IRON", "RETICCTPCT" in the last 72 hours. Urinalysis    Component Value Date/Time   COLORURINE YELLOW 03/16/2024 1757   APPEARANCEUR CLEAR 03/16/2024 1757   LABSPEC 1.024 03/16/2024 1757   PHURINE 5.0 03/16/2024 1757   GLUCOSEU >=500 (A) 03/16/2024 1757   HGBUR NEGATIVE 03/16/2024 1757   BILIRUBINUR NEGATIVE 03/16/2024 1757   KETONESUR NEGATIVE 03/16/2024 1757   PROTEINUR 100 (A) 03/16/2024 1757   UROBILINOGEN 0.2 01/01/2015 0912   NITRITE NEGATIVE 03/16/2024 1757   LEUKOCYTESUR NEGATIVE 03/16/2024 1757   Sepsis Labs Recent Labs  Lab 03/16/24 1644  WBC 15.7*   Microbiology No results found for this or any previous visit (from the past 240 hours).  Time coordinating discharge: 44 mins   SIGNED:  Standley Dakins, MD  [facility] 03/22/2024, 11:20 AM How to contact the [facility] Attending or Consulting provider 7A - 7P or covering provider during after hours 7P -7A, for this patient?  Check the care team in [facility] and look  for a) attending/consulting TR[facility] provider listed and b) the [facility] team listed Log into [email or web address] and use [place]'s universal password to access. If you do not have the password, please contact the hospital operator. Locate the [facility] provider you are looking for under [facility] and page to a number that you can be directly reached. If you still have difficulty reaching the provider, please page the [facility] (Director on Call) for the Hospitalists listed on amion for assistance.

## 2024-03-22 NOTE — ED Notes (Addendum)
error 

## 2024-03-22 NOTE — ED Provider Notes (Signed)
 Patient was accepted and will go to [facility] nursing home today   Thomas Berkshire, MD 03/22/24 [phone]

## 2024-03-22 NOTE — Discharge Instructions (Addendum)
 Please check blood glucose readings 5 times per day  Go to the nursing home at [facility] now

## 2024-03-22 NOTE — TOC Transition Note (Signed)
 Transition of Care [facility]) - Discharge Note   Patient Details  Name: Thomas Donovan MRN: [id number] Date of Birth: 1961-10-20  [facility] CM/SW Contact:  Isabella Bowens, LCSWA Phone Number: 03/22/2024, 11:24 AM   Clinical Narrative:    Patient is scheduled to DC today to [facility] in [place]. CSW reached out to AD Gro[facility] Canavan who shared that they can accept the patient today , room number is 405 and Call for report number is [phone]. Nurse and MD made aware. CSW attempted to call Daughter n Mamie Levers twice. Did leave a confidential voicemail. [facility] was also contacted as well to follow up with patient for hospice care. Family is aware of hospice following and agreeable. TOC signing off.   Fleet Contras returned CSW call back and was made aware that pt will be DC today to go to [facility].    Final next level of care: Skilled Nursing Facility Barriers to Discharge: Barriers Resolved   Patient Goals and CMS Choice Patient states their goals for this hospitalization and ongoing recovery are:: DC to [facility] for Long-term care CMS Medicare.gov Compare Post Acute Care list provided to:: Patient Represenative (must comment) Fleet Contras - Daughter in law) Choice offered to / list presented to :  (Daugher n law - Fleet Contras)      Discharge Placement PASRR number recieved: 03/19/24 Existing PASRR number confirmed : 03/22/24          Patient chooses bed at: [facility] Patient to be transferred to facility by: Wheelchair transportation Name of family member notified: Attempted to call Fleet Contras, left a VM Patient and family notified of of transfer: 03/22/24  Discharge Plan and Services Additional resources added to the After Visit Summary for          DME Arranged: N/A DME Agency: NA     HH Arranged: NA HH Agency: NA      Social Drivers of Health (SDOH) Interventions SDOH Screenings   Food Insecurity: No Food Insecurity (02/28/2024)  Housing: Low Risk  (02/28/2024)   Transportation Needs: No Transportation Needs (02/28/2024)  Utilities: Not At Risk (02/28/2024)  Financial Resource Strain: Medium Risk (01/05/2023)   [facility] from [facility]  Physical Activity: Inactive (10/04/2023)   Received from [facility]  Social Connections: Moderately Integrated (10/04/2023)   Received from [facility]  Stress: Stress Concern Present (10/04/2023)   Received from [facility]  Tobacco Use: Medium Risk (03/20/2024)  Health Literacy: Medium Risk (10/04/2023)   Received from [facility]     Readmission Risk Interventions     No data to display

## 2024-03-22 NOTE — ED Notes (Signed)
Crackers given.

## 2024-03-27 ENCOUNTER — Emergency Department (HOSPITAL_COMMUNITY)

## 2024-03-27 ENCOUNTER — Emergency Department (HOSPITAL_COMMUNITY)
Admission: EM | Admit: 2024-03-27 | Discharge: 2024-03-27 | Disposition: A | Discharge status: Home / Self Care | Attending: Emergency Medicine | Admitting: Emergency Medicine

## 2024-03-27 ENCOUNTER — Other Ambulatory Visit: Payer: Self-pay

## 2024-03-27 DIAGNOSIS — I4891 Unspecified atrial fibrillation: Secondary | ICD-10-CM | POA: Insufficient documentation

## 2024-03-27 DIAGNOSIS — Z79899 Other long term (current) drug therapy: Secondary | ICD-10-CM | POA: Insufficient documentation

## 2024-03-27 DIAGNOSIS — S42391A Other fracture of shaft of right humerus, initial encounter for closed fracture: Secondary | ICD-10-CM

## 2024-03-27 DIAGNOSIS — M79621 Pain in right upper arm: Secondary | ICD-10-CM | POA: Diagnosis present

## 2024-03-27 DIAGNOSIS — I10 Essential (primary) hypertension: Secondary | ICD-10-CM | POA: Diagnosis not present

## 2024-03-27 DIAGNOSIS — E119 Type 2 diabetes mellitus without complications: Secondary | ICD-10-CM | POA: Insufficient documentation

## 2024-03-27 DIAGNOSIS — S42321A Displaced transverse fracture of shaft of humerus, right arm, initial encounter for closed fracture: Secondary | ICD-10-CM | POA: Diagnosis not present

## 2024-03-27 DIAGNOSIS — W19XXXA Unspecified fall, initial encounter: Secondary | ICD-10-CM | POA: Insufficient documentation

## 2024-03-27 DIAGNOSIS — I2510 Atherosclerotic heart disease of native coronary artery without angina pectoris: Secondary | ICD-10-CM | POA: Diagnosis not present

## 2024-03-27 DIAGNOSIS — S0990XA Unspecified injury of head, initial encounter: Secondary | ICD-10-CM | POA: Insufficient documentation

## 2024-03-27 LAB — CBC WITH DIFFERENTIAL/PLATELET
Abs Immature Granulocytes: 0.12 10*3/uL — ABNORMAL HIGH (ref 0.00–0.07)
Basophils Absolute: 0 10*3/uL (ref 0.0–0.1)
Basophils Relative: 0 %
Eosinophils Absolute: 0 10*3/uL (ref 0.0–0.5)
Eosinophils Relative: 0 %
HCT: 30.5 % — ABNORMAL LOW (ref 39.0–52.0)
Hemoglobin: 9.6 g/dL — ABNORMAL LOW (ref 13.0–17.0)
Immature Granulocytes: 2 %
Lymphocytes Relative: 10 %
Lymphs Abs: 0.8 10*3/uL (ref 0.7–4.0)
MCH: 26.4 pg (ref 26.0–34.0)
MCHC: 31.5 g/dL (ref 30.0–36.0)
MCV: 83.8 fL (ref 80.0–100.0)
Monocytes Absolute: 0.7 10*3/uL (ref 0.1–1.0)
Monocytes Relative: 9 %
Neutro Abs: 6 10*3/uL (ref 1.7–7.7)
Neutrophils Relative %: 79 %
Platelets: 235 10*3/uL (ref 150–400)
RBC: 3.64 MIL/uL — ABNORMAL LOW (ref 4.22–5.81)
RDW: 18.7 % — ABNORMAL HIGH (ref 11.5–15.5)
WBC: 7.6 10*3/uL (ref 4.0–10.5)
nRBC: 0 % (ref 0.0–0.2)

## 2024-03-27 LAB — CBG MONITORING, ED: Glucose-Capillary: 82 mg/dL (ref 70–99)

## 2024-03-27 LAB — COMPREHENSIVE METABOLIC PANEL WITH GFR
ALT: 45 U/L — ABNORMAL HIGH (ref 0–44)
AST: 28 U/L (ref 15–41)
Albumin: 2.2 g/dL — ABNORMAL LOW (ref 3.5–5.0)
Alkaline Phosphatase: 95 U/L (ref 38–126)
Anion gap: 10 (ref 5–15)
BUN: 20 mg/dL (ref 8–23)
CO2: 26 mmol/L (ref 22–32)
Calcium: 8.1 mg/dL — ABNORMAL LOW (ref 8.9–10.3)
Chloride: 103 mmol/L (ref 98–111)
Creatinine, Ser: 0.98 mg/dL (ref 0.61–1.24)
GFR, Estimated: >60 mL/min (ref >60)
Glucose, Bld: 88 mg/dL (ref 70–99)
Potassium: 4.3 mmol/L (ref 3.5–5.1)
Sodium: 139 mmol/L (ref 135–145)
Total Bilirubin: 1.1 mg/dL (ref 0.0–1.2)
Total Protein: 5.1 g/dL — ABNORMAL LOW (ref 6.5–8.1)

## 2024-03-27 LAB — I-STAT CG4 LACTIC ACID, ED: Lactic Acid, Venous: 1.5 mmol/L (ref 0.5–1.9)

## 2024-03-27 LAB — PROTIME-INR
INR: 1.1 (ref 0.8–1.2)
Prothrombin Time: 13.9 s (ref 11.4–15.2)

## 2024-03-27 LAB — BRAIN NATRIURETIC PEPTIDE: B Natriuretic Peptide: 103.8 pg/mL — ABNORMAL HIGH (ref 0.0–100.0)

## 2024-03-27 LAB — APTT: aPTT: 26 s (ref 24–36)

## 2024-03-27 LAB — AMMONIA: Ammonia: 40 umol/L — ABNORMAL HIGH (ref 9–35)

## 2024-03-27 MED ORDER — LORAZEPAM 2 MG/ML IJ SOLN
2.0000 mg | Freq: Once | INTRAMUSCULAR | Status: AC
  Administered 2024-03-27: 2 mg via INTRAVENOUS
  Filled 2024-03-27: qty 1

## 2024-03-27 NOTE — Progress Notes (Signed)
 Orthopedic Tech Progress Note Patient Details:  Thomas Donovan Sep 03, 1961 [id number]  Ortho Devices Type of Ortho Device: Arm sling Ortho Device/Splint Location: RUE Ortho Device/Splint Interventions: Application   Post Interventions Patient Tolerated: Well  Thomas Donovan 03/27/2024, 10:58 AM

## 2024-03-27 NOTE — ED Notes (Signed)
 CT called and advised pt given ativan so we can try CT again

## 2024-03-27 NOTE — ED Notes (Signed)
 Pt transported to CT but per CT tech pt would not allow testing to be done. He refused and "started cussing at [place]" per CT.

## 2024-03-27 NOTE — ED Notes (Signed)
 This paramedic asked patient why he did not want the CT, he said "I don't want it." Pt then asked his age and he replied "5" also asked pt what month it is and he said "yes"

## 2024-03-27 NOTE — Progress Notes (Signed)
 Civil engineer, contracting Hospice liaison note     This patient is a current hospice patient with [facility].    Liaison will continue to follow for any discharge planning needs and to coordinate continuation of hospice care.    Please don't hesitate to call with any Hospice related questions or concerns.    Thank you for the opportunity to participate in this patient's care.  Glenna Fellows, BSN, RN, OCN [employer] [phone]  (Current Hospice Medication List)

## 2024-03-27 NOTE — ED Notes (Signed)
 Ortho at bedside.

## 2024-03-27 NOTE — ED Notes (Signed)
 Patient transported to CT

## 2024-03-27 NOTE — ED Provider Notes (Signed)
 [place] EMERGENCY DEPARTMENT AT [facility] Provider Note   CSN: [id number] Arrival date & time: 03/27/24  7829     History CAD, HTN, DM2  Chief Complaint  Patient presents with   Fall    Per EMS, pt fell 2 days ago. Facility obtained xrays yesterday and final results were displaced complete transverse fracture of proximal shaft of humerus (right arm). Pt was sent to facility due to an increase in swelling of the affected left arm. Pt is altered at baseline.     Thomas Donovan is a 63 y.o. male.  63 y.o male with an extensive PMH including atrial fibrillation not on anticoagulation, history of gout, coronary artery disease, essential hypertension, diabetes mellitus 2 presents to the ED s/p fall two days ago from [facility] and rehab. Level 5 caveat due to AMS.    The history is provided by the nursing home.  Fall       Home Medications Prior to Admission medications   Medication Sig Start Date End Date Taking? Authorizing Provider  barrier cream (NON-SPECIFIED) CREA Apply 1 Application topically in the morning and at bedtime.   Yes [provider]  dexamethasone (DECADRON) 6 MG tablet Take 1 tablet (6 mg total) by mouth 2 (two) times daily. 03/04/24  Yes Elgergawy, Leana Roe, MD  diltiazem (CARDIZEM CD) 180 MG 24 hr capsule Take 1 capsule (180 mg total) by mouth daily. 03/03/24  Yes Elgergawy, Leana Roe, MD  diltiazem (CARDIZEM) 60 MG tablet Take 60 mg by mouth every 8 (eight) hours.   Yes [provider]  finasteride (PROSCAR) 5 MG tablet Take 1 tablet (5 mg total) by mouth daily. 03/03/24 03/03/25 Yes Elgergawy, Leana Roe, MD  insulin aspart (NOVOLOG) 100 UNIT/ML injection Inject 15 Units into the skin 3 (three) times daily with meals. GIVE ONLY IF EATS 50% OR MORE OF MEAL. 03/22/24  Yes Johnson, Clanford L, MD  insulin aspart (NOVOLOG) 100 UNIT/ML injection Inject 0-5 Units into the skin at bedtime. Patient taking differently: Inject 5 Units into  the skin at bedtime. 03/22/24  Yes Johnson, Clanford L, MD  insulin aspart (NOVOLOG) 100 UNIT/ML injection Inject 0-20 Units into the skin 3 (three) times daily with meals. Patient taking differently: Inject 2-10 Units into the skin 3 (three) times daily with meals. Sliding Scale: 0-200: 2 units 201-250: 4 units 251-300: 6 units 301-350: 8 units  351-400: 10 units  > 400 call MD. 03/22/24  Yes Johnson, Clanford L, MD  insulin glargine (LANTUS) 100 UNIT/ML Solostar Pen Inject 45 Units into the skin daily. Patient taking differently: Inject 32 Units into the skin every 12 (twelve) hours. 03/23/24  Yes Johnson, Clanford L, MD  levETIRAcetam (KEPPRA) 500 MG tablet Take 1 tablet (500 mg total) by mouth 2 (two) times daily. 03/03/24  Yes Elgergawy, Leana Roe, MD  LORazepam (ATIVAN) 1 MG tablet Take 1 tablet (1 mg total) by mouth every 6 (six) hours as needed for anxiety or seizure. 03/22/24 03/22/25 Yes Johnson, Clanford L, MD  methadone (DOLOPHINE) 5 MG tablet Take 1 tablet (5 mg total) by mouth 3 (three) times daily. 03/22/24  Yes Johnson, Clanford L, MD  ondansetron (ZOFRAN) 4 MG tablet Take 1 tablet (4 mg total) by mouth every 6 (six) hours as needed for nausea. Patient taking differently: Take 4 mg by mouth every 6 (six) hours as needed for nausea or vomiting (severe pain). 03/03/24  Yes Elgergawy, Leana Roe, MD  Oxycodone HCl 10 MG  TABS Take 1 tablet (10 mg total) by mouth every 4 (four) hours as needed (breakthrough pain). 03/22/24  Yes Johnson, Clanford L, MD  pantoprazole (PROTONIX) 40 MG tablet Take 1 tablet (40 mg total) by mouth 2 (two) times daily. 03/03/24  Yes Elgergawy, Leana Roe, MD  QUEtiapine (SEROQUEL) 50 MG tablet Take 1 tablet (50 mg total) by mouth every 6 (six) hours. 03/22/24  Yes Johnson, Clanford L, MD  senna (SENOKOT) 8.6 MG TABS tablet Take 1 tablet by mouth in the morning and at bedtime.   Yes [provider]  tamsulosin (FLOMAX) 0.4 MG CAPS capsule Take 1 capsule (0.4 mg total)  by mouth daily. 03/03/24  Yes Elgergawy, Leana Roe, MD      Allergies    Celebrex [celecoxib], Guaifenesin, Ibuprofen, Tramadol, Acetaminophen, Aspirin, Ketorolac, Nsaids, and Toradol [ketorolac tromethamine]    Review of Systems   Review of Systems  Unable to perform ROS: Mental status change    Physical Exam Updated Vital Signs BP (!) 175/118 (BP Location: Left Arm)   Pulse (!) 144   Temp 98.3 F (36.8 C) (Oral)   Resp 18   SpO2 92%  Physical Exam Vitals and nursing note reviewed.  HENT:     Head: Normocephalic.  Eyes:     Pupils: Pupils are equal, round, and reactive to light.  Cardiovascular:     Rate and Rhythm: Normal rate.  Pulmonary:     Effort: Pulmonary effort is normal.     Breath sounds: Rales present.     Comments: Lungs are diminished to auscultation at the bases.  Abdominal:     General: Abdomen is flat.     Tenderness: There is no abdominal tenderness.     Comments: No abdominal guarding on exam.   Musculoskeletal:        General: Deformity present.     Cervical back: Normal range of motion and neck supple.     Comments: TTP along the humeral head, bruising noted.   Skin:    General: Skin is warm and dry.     Findings: Bruising present.     Comments: Bruising along with swelling to the right upper arm. Radial pulse intact.   Neurological:     Mental Status: He is alert. He is disoriented and confused.     Cranial Nerves: No facial asymmetry.     Comments: Does not follow commands, appears altered but this waxes and wanes. Does respond to pain on upper and lower extremities.      ED Results / Procedures / Treatments   Labs (all labs ordered are listed, but only abnormal results are displayed) Labs Reviewed  COMPREHENSIVE METABOLIC PANEL WITH GFR - Abnormal; Notable for the following components:      Result Value   Calcium 8.1 (*)    Total Protein 5.1 (*)    Albumin 2.2 (*)    ALT 45 (*)    All other components within normal limits  CBC WITH  DIFFERENTIAL/PLATELET - Abnormal; Notable for the following components:   RBC 3.64 (*)    Hemoglobin 9.6 (*)    HCT 30.5 (*)    RDW 18.7 (*)    Abs Immature Granulocytes 0.12 (*)    All other components within normal limits  AMMONIA - Abnormal; Notable for the following components:   Ammonia 40 (*)    All other components within normal limits  BRAIN NATRIURETIC PEPTIDE - Abnormal; Notable for the following components:   B Natriuretic Peptide 103.8 (*)  All other components within normal limits  CULTURE, BLOOD (ROUTINE X 2)  CULTURE, BLOOD (ROUTINE X 2)  PROTIME-INR  APTT  URINALYSIS, ROUTINE W REFLEX MICROSCOPIC  LEVETIRACETAM LEVEL  CBG MONITORING, ED  I-STAT CG4 LACTIC ACID, ED  I-STAT CG4 LACTIC ACID, ED    EKG EKG Interpretation Date/Time:  Wednesday March 27 2024 06:48:56 EDT Ventricular Rate:  146 PR Interval:    QRS Duration:  82 QT Interval:  304 QTC Calculation: 474 R Axis:   71  Text Interpretation: Atrial fibrillation Borderline low voltage, extremity leads Nonspecific T abnormalities, lateral leads Confirmed by [name] ([id number]) on 03/27/2024 6:54:31 AM  Radiology DG Chest Port 1 View Result Date: 03/27/2024 CLINICAL DATA:  Questionable sepsis. EXAM: PORTABLE CHEST 1 VIEW COMPARISON:  One view chest x-ray 03/16/2024. FINDINGS: Heart size is exaggerated. Low lung volumes are again noted. No edema or effusion is present. No focal airspace disease is present. The visualized soft tissues and bony thorax are unremarkable. IMPRESSION: 1. Low lung volumes. 2. No acute cardiopulmonary disease. Electronically Signed   By: Marin Roberts M.D.   On: 03/27/2024 10:15   DG Humerus Right Result Date: 03/27/2024 CLINICAL DATA:  Fall.  Proximal right arm pain. EXAM: RIGHT HUMERUS - 2 VIEW COMPARISON:  Right humerus radiographs 02/27/2024. FINDINGS: A comminuted transverse fracture is present in the proximal humerus metaphysis. Advanced degenerative changes are  present in the right shoulder. The elbow is intact. IMPRESSION: 1. Comminuted transverse fracture of the proximal humerus metaphysis. 2. Advanced degenerative changes in the right shoulder. Electronically Signed   By: Marin Roberts M.D.   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 Fall. EXAM: CT CERVICAL SPINE WITHOUT CONTRAST TECHNIQUE: Multidetector CT imaging of the cervical spine was performed without intravenous contrast. Multiplanar CT image reconstructions were also generated. RADIATION DOSE REDUCTION: This exam was performed according to the departmental dose-optimization program which includes automated exposure control, adjustment of the mA and/or kV according to patient size and/or use of iterative reconstruction technique. COMPARISON:  Head CT today.  Cervical spine CT 02/26/2024. FINDINGS: Alignment: Stable straightening of cervical lordosis. Cervicothoracic junction alignment is within normal limits. Bilateral posterior element alignment is within normal limits. Skull base and vertebrae: Visible skull base bone mineralization is stable and within normal limits. C1 and C2 appear intact and aligned. No acute osseous abnormality identified in the cervical spine. Destructive osseous lesion of the right mandible condyle. Known left middle cranial fossa trans spatial tumor not well visualized on these images. Soft tissues and spinal canal: No prevertebral fluid or swelling. No visible canal hematoma. Stable visible noncontrast neck soft tissues. Disc levels:  Stable cervical spine degeneration, maximal at C5-C6. Upper chest: Mild T2 superior endplate compression is stable. Negative visible lung apices. IMPRESSION: 1. No acute traumatic injury identified in the cervical spine. Stable cervical spine degeneration. 2. Destructive right  mandible bone metastasis re-demonstrated. Electronically Signed   By: Odessa Fleming M.D.   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 Fall. EXAM: CT HEAD WITHOUT CONTRAST TECHNIQUE: Contiguous axial images were obtained from the base of the skull through the vertex without intravenous contrast. RADIATION DOSE REDUCTION: This exam was performed according to the departmental  dose-optimization program which includes automated exposure control, adjustment of the mA and/or kV according to patient size and/or use of iterative reconstruction technique. COMPARISON:  Head CT 03/16/2024 and earlier. FINDINGS: Study is intermittently degraded by motion artifact despite repeated imaging attempts. Brain: Fairly severe tumoral edema in the left hemisphere, primarily vasogenic pattern. Underlying mixed density left middle cranial fossa large soft tissue component mass as demonstrated on MRI last month. Since 03/16/2024 intracranial mass effect and rightward midline shift have progressed up to 9 mm now (previously 7 mm). Subtotal effacement of the left lateral ventricle. Right lateral ventricle size remains stable. Basilar cisterns remain patent. No superimposed acute intracranial hemorrhage or cortically based infarct identified. Stable gray-white differentiation in the right hemisphere and the posterior fossa. Vascular: No suspicious intracranial vascular hyperdensity. Skull: Permeative, osteoblastic left skull base tumor. Stable CT bone appearance from last month. Sinuses/Orbits: Opacified due to mucosal thickening and layering low-density fluid. Tympanic cavities and mastoids are clear. Other: No acute orbit or scalp soft tissue injury identified. IMPRESSION: 1. Motion degraded exam. 2. Known large left middle cranial fossa tumor with progressed left hemisphere edema and  intracranial mass effect since 03/16/2024, rightward midline shift now 9 mm (previously 7 mm). 3. No superimposed acute traumatic injury identified. Electronically Signed   By: Odessa Fleming M.D.   On: 03/27/2024 09:22    Procedures .Critical Care  Performed by: Claude Manges, PA-C Authorized by: Claude Manges, PA-C   Critical care provider statement:    Critical care time (minutes):  45   Critical care start time:  03/27/2024 7:00 AM   Critical care end time:  03/27/2024 7:45 AM   Critical care was necessary to treat or prevent imminent or life-threatening deterioration of the following conditions:  CNS failure or compromise   Care discussed with: admitting provider       Medications Ordered in ED Medications  LORazepam (ATIVAN) injection 2 mg (2 mg Intravenous Given 03/27/24 0805)    ED Course/ Medical Decision Making/ A&P Clinical Course as of 03/27/24 1030  Wed Mar 27, 2024  0939 Ammonia(!): 40 [JS]    Clinical Course User Index [JS] Claude Manges, PA-C                                 Medical Decision Making Amount and/or Complexity of Data Reviewed Labs: ordered. Decision-making details documented in ED Course. Radiology: ordered.  Risk Prescription drug management.    Patient arriving from [facility] rehab where he is being treated for his terminal illness, along with recent discharge from the hospital on February 26, 2024.  I did speak to RN Josie who was in charge of patient this morning, she does report the patient suffered a witnessed fall 2 days ago, he was found by one of the nursing assistance on the ground, unknown whether there was seizure activity versus mechanical fall.  He did have an x-ray of his right arm last night which showed a humeral shaft fracture.  He arrives to the ED today confused, level 5 caveat due to altered mental status.  He does not really follow commands, he is yelling names,but does endorse pain with palpation of his extremities.   Extensive chart  review does show that on his last visit of February 26, 2024, patient did have a CT which showed a large infarct, he was anticoagulated previously on Eliquis due to his ongoing A-fib, this has since been stopped.  He did arrive to the emergency department in A-fib with RVR with a heart rate in the 140s.  I did feel that patient's altered mental status can be contributed to his longstanding history of drinking, and ammonia level was also ordered.  Will also obtain scans of his head, cervical spine, chest x-ray, blood cultures.  Interpretation of his labs by me, CMP with no electrolyte derangement,CBC with no leukocytosis but a drop in his hemoglobin to 9.6 from 12 three weeks ago. PT/INR normal, according to chart and facility he is no longer anticoagulated. Ammonia is elevated. BNP is slightly elevated, appears slight fluid overloaded on exam. Lactic is negative.     CT Head showed: IMPRESSION:  1. Motion degraded exam.  2. Known large left middle cranial fossa tumor with progressed left  hemisphere edema and intracranial mass effect since 03/16/2024,  rightward midline shift now 9 mm (previously 7 mm).  3. No superimposed acute traumatic injury identified.   CT Cervical spine showed: IMPRESSION:  1. No acute traumatic injury identified in the cervical spine.  Stable cervical spine degeneration.  2. Destructive right mandible bone metastasis re-demonstrated.   Attempted to admit patient due seeing as he is under hospice care will contact guardian for further recommendations.  10:21 AM Spoke to [place] sister in law who is the guardian under CPS who has not been able to contact his wife due to addiction. We discussed patient's Afib with Rvr along with worsening edema in his CT Head, given the option of admitting patient versus discharging to hospice. She saw patient yesterday and he was confused but at baseline, she noted his arm to be swollen. She does not wish for patient to be admitted at this time  if he can be kept comfortable at the facility due to his terminal diagnosis.  Will contact [facility] to transfer patient back to facility.   10:29 AM Spoke to Dietitian at [employer], she was notified of patient, new humerus fracture and return to facility.  Patient is stable for discharge.    Portions of this note were generated with Scientist, clinical (histocompatibility and immunogenetics). Dictation errors may occur despite best attempts at proofreading.   Final Clinical Impression(s) / ED Diagnoses Final diagnoses:  Fall, initial encounter  Other closed fracture of shaft of right humerus, initial encounter    Rx / DC Orders ED Discharge Orders     None         Claude Manges, PA-C 03/27/24 1030    [name], MD 03/28/24 [phone]

## 2024-03-28 LAB — LEVETIRACETAM LEVEL: Levetiracetam Lvl: 6.3 ug/mL — ABNORMAL LOW (ref 10.0–40.0)

## 2024-04-04 ENCOUNTER — Emergency Department (HOSPITAL_COMMUNITY)

## 2024-04-04 ENCOUNTER — Inpatient Hospital Stay (HOSPITAL_COMMUNITY)
Admission: EM | Admit: 2024-04-04 | Discharge: 2024-04-11 | DRG: 951 | Disposition: A | Discharge status: Hospice | Source: Skilled Nursing Facility | Attending: Internal Medicine | Admitting: Internal Medicine

## 2024-04-04 ENCOUNTER — Encounter (HOSPITAL_COMMUNITY): Payer: Self-pay | Admitting: Emergency Medicine

## 2024-04-04 ENCOUNTER — Other Ambulatory Visit: Payer: Self-pay

## 2024-04-04 DIAGNOSIS — S42301D Unspecified fracture of shaft of humerus, right arm, subsequent encounter for fracture with routine healing: Secondary | ICD-10-CM

## 2024-04-04 DIAGNOSIS — I130 Hypertensive heart and chronic kidney disease with heart failure and stage 1 through stage 4 chronic kidney disease, or unspecified chronic kidney disease: Secondary | ICD-10-CM | POA: Diagnosis present

## 2024-04-04 DIAGNOSIS — I4891 Unspecified atrial fibrillation: Secondary | ICD-10-CM | POA: Diagnosis not present

## 2024-04-04 DIAGNOSIS — S42301G Unspecified fracture of shaft of humerus, right arm, subsequent encounter for fracture with delayed healing: Secondary | ICD-10-CM | POA: Diagnosis not present

## 2024-04-04 DIAGNOSIS — R627 Adult failure to thrive: Secondary | ICD-10-CM | POA: Diagnosis present

## 2024-04-04 DIAGNOSIS — E1122 Type 2 diabetes mellitus with diabetic chronic kidney disease: Secondary | ICD-10-CM | POA: Diagnosis present

## 2024-04-04 DIAGNOSIS — D492 Neoplasm of unspecified behavior of bone, soft tissue, and skin: Secondary | ICD-10-CM | POA: Diagnosis present

## 2024-04-04 DIAGNOSIS — R531 Weakness: Secondary | ICD-10-CM | POA: Diagnosis present

## 2024-04-04 DIAGNOSIS — N1831 Chronic kidney disease, stage 3a: Secondary | ICD-10-CM

## 2024-04-04 DIAGNOSIS — W19XXXD Unspecified fall, subsequent encounter: Secondary | ICD-10-CM | POA: Diagnosis present

## 2024-04-04 DIAGNOSIS — C61 Malignant neoplasm of prostate: Secondary | ICD-10-CM | POA: Diagnosis not present

## 2024-04-04 DIAGNOSIS — I482 Chronic atrial fibrillation, unspecified: Secondary | ICD-10-CM | POA: Diagnosis present

## 2024-04-04 DIAGNOSIS — I2510 Atherosclerotic heart disease of native coronary artery without angina pectoris: Secondary | ICD-10-CM | POA: Diagnosis present

## 2024-04-04 DIAGNOSIS — Z981 Arthrodesis status: Secondary | ICD-10-CM

## 2024-04-04 DIAGNOSIS — R451 Restlessness and agitation: Secondary | ICD-10-CM | POA: Diagnosis not present

## 2024-04-04 DIAGNOSIS — I480 Paroxysmal atrial fibrillation: Secondary | ICD-10-CM | POA: Diagnosis present

## 2024-04-04 DIAGNOSIS — Z7189 Other specified counseling: Secondary | ICD-10-CM | POA: Diagnosis not present

## 2024-04-04 DIAGNOSIS — N390 Urinary tract infection, site not specified: Secondary | ICD-10-CM | POA: Diagnosis present

## 2024-04-04 DIAGNOSIS — I10 Essential (primary) hypertension: Secondary | ICD-10-CM

## 2024-04-04 DIAGNOSIS — C787 Secondary malignant neoplasm of liver and intrahepatic bile duct: Secondary | ICD-10-CM

## 2024-04-04 DIAGNOSIS — G9341 Metabolic encephalopathy: Secondary | ICD-10-CM

## 2024-04-04 DIAGNOSIS — Z5986 Financial insecurity: Secondary | ICD-10-CM

## 2024-04-04 DIAGNOSIS — I5032 Chronic diastolic (congestive) heart failure: Secondary | ICD-10-CM | POA: Diagnosis not present

## 2024-04-04 DIAGNOSIS — S42309A Unspecified fracture of shaft of humerus, unspecified arm, initial encounter for closed fracture: Secondary | ICD-10-CM | POA: Insufficient documentation

## 2024-04-04 DIAGNOSIS — G936 Cerebral edema: Secondary | ICD-10-CM | POA: Diagnosis present

## 2024-04-04 DIAGNOSIS — Z789 Other specified health status: Secondary | ICD-10-CM | POA: Diagnosis not present

## 2024-04-04 DIAGNOSIS — Z6831 Body mass index (BMI) 31.0-31.9, adult: Secondary | ICD-10-CM | POA: Diagnosis not present

## 2024-04-04 DIAGNOSIS — R1900 Intra-abdominal and pelvic swelling, mass and lump, unspecified site: Secondary | ICD-10-CM | POA: Diagnosis present

## 2024-04-04 DIAGNOSIS — C7940 Secondary malignant neoplasm of unspecified part of nervous system: Secondary | ICD-10-CM

## 2024-04-04 DIAGNOSIS — E1165 Type 2 diabetes mellitus with hyperglycemia: Secondary | ICD-10-CM | POA: Diagnosis not present

## 2024-04-04 DIAGNOSIS — Z794 Long term (current) use of insulin: Secondary | ICD-10-CM | POA: Diagnosis not present

## 2024-04-04 DIAGNOSIS — Z66 Do not resuscitate: Secondary | ICD-10-CM | POA: Diagnosis not present

## 2024-04-04 DIAGNOSIS — R638 Other symptoms and signs concerning food and fluid intake: Secondary | ICD-10-CM | POA: Diagnosis not present

## 2024-04-04 DIAGNOSIS — E669 Obesity, unspecified: Secondary | ICD-10-CM

## 2024-04-04 DIAGNOSIS — Z886 Allergy status to analgesic agent status: Secondary | ICD-10-CM

## 2024-04-04 DIAGNOSIS — F17210 Nicotine dependence, cigarettes, uncomplicated: Secondary | ICD-10-CM | POA: Diagnosis present

## 2024-04-04 DIAGNOSIS — K219 Gastro-esophageal reflux disease without esophagitis: Secondary | ICD-10-CM | POA: Diagnosis present

## 2024-04-04 DIAGNOSIS — Z515 Encounter for palliative care: Secondary | ICD-10-CM | POA: Diagnosis not present

## 2024-04-04 DIAGNOSIS — Z885 Allergy status to narcotic agent status: Secondary | ICD-10-CM

## 2024-04-04 DIAGNOSIS — Z79899 Other long term (current) drug therapy: Secondary | ICD-10-CM

## 2024-04-04 DIAGNOSIS — G4733 Obstructive sleep apnea (adult) (pediatric): Secondary | ICD-10-CM

## 2024-04-04 DIAGNOSIS — Z888 Allergy status to other drugs, medicaments and biological substances status: Secondary | ICD-10-CM

## 2024-04-04 LAB — COMPREHENSIVE METABOLIC PANEL WITH GFR
ALT: 56 U/L — ABNORMAL HIGH (ref 0–44)
AST: 26 U/L (ref 15–41)
Albumin: 2.3 g/dL — ABNORMAL LOW (ref 3.5–5.0)
Alkaline Phosphatase: 138 U/L — ABNORMAL HIGH (ref 38–126)
Anion gap: 11 (ref 5–15)
BUN: 31 mg/dL — ABNORMAL HIGH (ref 8–23)
CO2: 25 mmol/L (ref 22–32)
Calcium: 8.3 mg/dL — ABNORMAL LOW (ref 8.9–10.3)
Chloride: 101 mmol/L (ref 98–111)
Creatinine, Ser: 1.28 mg/dL — ABNORMAL HIGH (ref 0.61–1.24)
GFR, Estimated: >60 mL/min (ref >60)
Glucose, Bld: 462 mg/dL — ABNORMAL HIGH (ref 70–99)
Potassium: 4 mmol/L (ref 3.5–5.1)
Sodium: 137 mmol/L (ref 135–145)
Total Bilirubin: 1.7 mg/dL — ABNORMAL HIGH (ref 0.0–1.2)
Total Protein: 5.1 g/dL — ABNORMAL LOW (ref 6.5–8.1)

## 2024-04-04 LAB — CBC WITH DIFFERENTIAL/PLATELET
Abs Immature Granulocytes: 0.11 10*3/uL — ABNORMAL HIGH (ref 0.00–0.07)
Basophils Absolute: 0 10*3/uL (ref 0.0–0.1)
Basophils Relative: 0 %
Eosinophils Absolute: 0 10*3/uL (ref 0.0–0.5)
Eosinophils Relative: 0 %
HCT: 30.5 % — ABNORMAL LOW (ref 39.0–52.0)
Hemoglobin: 9.5 g/dL — ABNORMAL LOW (ref 13.0–17.0)
Immature Granulocytes: 1 %
Lymphocytes Relative: 2 %
Lymphs Abs: 0.2 10*3/uL — ABNORMAL LOW (ref 0.7–4.0)
MCH: 26 pg (ref 26.0–34.0)
MCHC: 31.1 g/dL (ref 30.0–36.0)
MCV: 83.6 fL (ref 80.0–100.0)
Monocytes Absolute: 0.5 10*3/uL (ref 0.1–1.0)
Monocytes Relative: 4 %
Neutro Abs: 12.4 10*3/uL — ABNORMAL HIGH (ref 1.7–7.7)
Neutrophils Relative %: 93 %
Platelets: 215 10*3/uL (ref 150–400)
RBC: 3.65 MIL/uL — ABNORMAL LOW (ref 4.22–5.81)
RDW: 18.8 % — ABNORMAL HIGH (ref 11.5–15.5)
WBC: 13.3 10*3/uL — ABNORMAL HIGH (ref 4.0–10.5)
nRBC: 0 % (ref 0.0–0.2)

## 2024-04-04 LAB — URINALYSIS, ROUTINE W REFLEX MICROSCOPIC
Bilirubin Urine: NEGATIVE
Glucose, UA: >=500 mg/dL — AB
Hgb urine dipstick: NEGATIVE
Ketones, ur: NEGATIVE mg/dL
Nitrite: NEGATIVE
Protein, ur: >=300 mg/dL — AB
Specific Gravity, Urine: 1.022 (ref 1.005–1.030)
pH: 5 (ref 5.0–8.0)

## 2024-04-04 LAB — TROPONIN I (HIGH SENSITIVITY)
Troponin I (High Sensitivity): 14 ng/L (ref <18)
Troponin I (High Sensitivity): 16 ng/L (ref <18)

## 2024-04-04 LAB — CBG MONITORING, ED: Glucose-Capillary: 376 mg/dL — ABNORMAL HIGH (ref 70–99)

## 2024-04-04 LAB — GLUCOSE, CAPILLARY: Glucose-Capillary: 326 mg/dL — ABNORMAL HIGH (ref 70–99)

## 2024-04-04 MED ORDER — QUETIAPINE FUMARATE 50 MG PO TABS: 50.0000 mg | ORAL_TABLET | Freq: Four times a day (QID) | ORAL | Status: DC

## 2024-04-04 MED ORDER — HALOPERIDOL 0.5 MG PO TABS: 0.5000 mg | ORAL_TABLET | ORAL | Status: DC | PRN

## 2024-04-04 MED ORDER — OXYCODONE HCL 5 MG PO TABS: 5.0000 mg | ORAL_TABLET | Freq: Four times a day (QID) | ORAL | Status: DC | PRN

## 2024-04-04 MED ORDER — SODIUM CHLORIDE 0.9 % IV BOLUS: 1000.0000 mL | Freq: Once | INTRAVENOUS | Status: DC

## 2024-04-04 MED ORDER — SODIUM CHLORIDE 0.9 % IV SOLN: 1.0000 g | INTRAVENOUS | Status: DC

## 2024-04-04 MED ORDER — METOPROLOL TARTRATE 5 MG/5ML IV SOLN
5.0000 mg | INTRAVENOUS | Status: DC | PRN
  Administered 2024-04-05: 5 mg via INTRAVENOUS
  Filled 2024-04-04: qty 5

## 2024-04-04 MED ORDER — DILTIAZEM HCL ER COATED BEADS 180 MG PO CP24
180.0000 mg | ORAL_CAPSULE | Freq: Every day | ORAL | Status: DC
  Administered 2024-04-05 – 2024-04-09 (×3): 180 mg via ORAL
  Filled 2024-04-04 (×5): qty 1

## 2024-04-04 MED ORDER — SODIUM CHLORIDE 0.9 % IV BOLUS
500.0000 mL | Freq: Once | INTRAVENOUS | Status: AC
  Administered 2024-04-04: 500 mL via INTRAVENOUS

## 2024-04-04 MED ORDER — DILTIAZEM LOAD VIA INFUSION
20.0000 mg | Freq: Once | INTRAVENOUS | Status: AC
  Administered 2024-04-04: 20 mg via INTRAVENOUS
  Filled 2024-04-04: qty 20

## 2024-04-04 MED ORDER — PROCHLORPERAZINE EDISYLATE 10 MG/2ML IJ SOLN: 10.0000 mg | Freq: Two times a day (BID) | INTRAMUSCULAR | Status: DC | PRN

## 2024-04-04 MED ORDER — METOPROLOL TARTRATE 5 MG/5ML IV SOLN: 10.0000 mg | Freq: Once | INTRAVENOUS | Status: DC

## 2024-04-04 MED ORDER — DILTIAZEM HCL-DEXTROSE 125-5 MG/125ML-% IV SOLN (PREMIX)
5.0000 mg/h | INTRAVENOUS | Status: DC
  Administered 2024-04-04: 5 mg/h via INTRAVENOUS
  Filled 2024-04-04: qty 125

## 2024-04-04 MED ORDER — INSULIN GLARGINE-YFGN 100 UNIT/ML ~~LOC~~ SOLN
30.0000 [IU] | Freq: Every day | SUBCUTANEOUS | Status: DC
  Filled 2024-04-04: qty 0.3

## 2024-04-04 MED ORDER — BIOTENE DRY MOUTH MT LIQD: 15.0000 mL | OROMUCOSAL | Status: DC | PRN

## 2024-04-04 MED ORDER — MORPHINE SULFATE (CONCENTRATE) 10 MG /0.5 ML PO SOLN: 10.0000 mg | ORAL | Status: DC | PRN

## 2024-04-04 MED ORDER — INSULIN ASPART 100 UNIT/ML IJ SOLN: 0.0000 [IU] | Freq: Four times a day (QID) | INTRAMUSCULAR | Status: DC

## 2024-04-04 MED ORDER — LEVETIRACETAM IN NACL 500 MG/100ML IV SOLN
500.0000 mg | Freq: Two times a day (BID) | INTRAVENOUS | Status: DC
  Administered 2024-04-04 – 2024-04-11 (×15): 500 mg via INTRAVENOUS
  Filled 2024-04-04 (×15): qty 100

## 2024-04-04 MED ORDER — LORAZEPAM 2 MG/ML IJ SOLN: 1.0000 mg | Freq: Three times a day (TID) | INTRAMUSCULAR | Status: DC

## 2024-04-04 MED ORDER — HALOPERIDOL LACTATE 5 MG/ML IJ SOLN: 0.5000 mg | INTRAMUSCULAR | Status: DC | PRN

## 2024-04-04 MED ORDER — LORAZEPAM 2 MG/ML IJ SOLN: 1.0000 mg | Freq: Four times a day (QID) | INTRAMUSCULAR | Status: DC | PRN

## 2024-04-04 MED ORDER — HYDROMORPHONE HCL 1 MG/ML IJ SOLN
1.0000 mg | Freq: Once | INTRAMUSCULAR | Status: AC
  Administered 2024-04-04: 1 mg via INTRAVENOUS
  Filled 2024-04-04: qty 1

## 2024-04-04 MED ORDER — LORAZEPAM 2 MG/ML IJ SOLN
2.0000 mg | Freq: Once | INTRAMUSCULAR | Status: AC
  Administered 2024-04-04: 2 mg via INTRAVENOUS
  Filled 2024-04-04: qty 1

## 2024-04-04 MED ORDER — PROCHLORPERAZINE 25 MG RE SUPP: 25.0000 mg | Freq: Two times a day (BID) | RECTAL | Status: DC | PRN

## 2024-04-04 MED ORDER — PROCHLORPERAZINE MALEATE 5 MG PO TABS: 5.0000 mg | ORAL_TABLET | Freq: Four times a day (QID) | ORAL | Status: DC | PRN

## 2024-04-04 MED ORDER — HALOPERIDOL LACTATE 5 MG/ML IJ SOLN
2.0000 mg | INTRAMUSCULAR | Status: DC | PRN
  Administered 2024-04-04: 5 mg via INTRAVENOUS
  Administered 2024-04-05: 2 mg via INTRAVENOUS
  Administered 2024-04-06 – 2024-04-07 (×2): 5 mg via INTRAVENOUS
  Filled 2024-04-04 (×5): qty 1

## 2024-04-04 MED ORDER — HALOPERIDOL LACTATE 5 MG/ML IJ SOLN
5.0000 mg | Freq: Once | INTRAMUSCULAR | Status: AC
  Administered 2024-04-04: 5 mg via INTRAMUSCULAR
  Filled 2024-04-04: qty 1

## 2024-04-04 MED ORDER — OXYCODONE HCL 5 MG PO TABS: 10.0000 mg | ORAL_TABLET | Freq: Four times a day (QID) | ORAL | Status: DC | PRN

## 2024-04-04 MED ORDER — METHADONE HCL 10 MG PO TABS
10.0000 mg | ORAL_TABLET | Freq: Three times a day (TID) | ORAL | Status: DC
  Administered 2024-04-04 – 2024-04-09 (×11): 10 mg via ORAL
  Filled 2024-04-04 (×12): qty 1

## 2024-04-04 MED ORDER — HALOPERIDOL LACTATE 2 MG/ML PO CONC: 0.5000 mg | ORAL | Status: DC | PRN

## 2024-04-04 MED ORDER — SODIUM CHLORIDE 0.9 % IV SOLN
1.0000 g | Freq: Once | INTRAVENOUS | Status: AC
  Administered 2024-04-04: 1 g via INTRAVENOUS
  Filled 2024-04-04: qty 10

## 2024-04-04 NOTE — Plan of Care (Signed)
  Problem: Safety: Goal: Non-violent Restraint(s) Outcome: Progressing   Problem: Elimination: Goal: Will not experience complications related to urinary retention Outcome: Progressing   This nurse did untie the leg restraint from patient.

## 2024-04-04 NOTE — Assessment & Plan Note (Signed)
-  Continue glargine -Continue SS corrections  

## 2024-04-04 NOTE — Progress Notes (Signed)
 Civil engineer, contracting Hospice liaison note     This patient is a current hospice patient with [facility].    Liaison will continue to follow for any discharge planning needs and to coordinate continuation of hospice care.    Please don't hesitate to call with any Hospice related questions or concerns.    Thank you for the opportunity to participate in this patient's care.  Glenna Fellows, BSN, RN, OCN [facility]: [facility] Liaison [phone] (Current hospice medication list)

## 2024-04-04 NOTE — Progress Notes (Signed)
 Patient arrived to his room. He was in 3 (4) point restraints. Sister-in-law in room.

## 2024-04-04 NOTE — ED Notes (Signed)
 Pt throwing legs over railing and trying to get out of bed. Pt cursing loudly at staff when trying to redirect or help reposition.

## 2024-04-04 NOTE — ED Notes (Signed)
 Pt pulling off cardiac monitor, BP cuff and pulse ox. Pt not redirectable and trying to take gown off.

## 2024-04-04 NOTE — Assessment & Plan Note (Addendum)
 Due to tumor, end stage cancer, failure to thrive.  He did well on scheduled Seroquel, methadone prior to moving in to [place] (where he was getting Seroquel and oxycodone only PRN) - Comfort care - Schedule Seroquel - Schedule Ativan - Scheduled methadone - PRN oxycodone

## 2024-04-04 NOTE — Assessment & Plan Note (Signed)
 Comfort care

## 2024-04-04 NOTE — Assessment & Plan Note (Addendum)
 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

## 2024-04-04 NOTE — ED Notes (Signed)
 Pt very agitated and attempting to get out of bed and swing at staff.

## 2024-04-04 NOTE — ED Notes (Signed)
 Pt pulled ems iv out

## 2024-04-04 NOTE — ED Triage Notes (Signed)
 Pt arrives via EMS from [place] with reports of possible fall, afib RVR and swelling to right arm. Pt altered at baseline and recent fx to right arm, no sling or cast on. EMS reports HR 160s and 20 cardizem given.  Facility gave 10 mg oxy at 6 am and 1 mg ativan at 6:40 to help [facility] assess pt.

## 2024-04-04 NOTE — H&P (Addendum)
 History and Physical    Patient: Thomas Donovan YNW:[id number] DOB: 1961-11-23 DOA: 04/04/2024 DOS: the patient was seen and examined on 04/04/2024 PCP: Pcp, No  Patient coming from: SNF  Chief Complaint:  Chief Complaint  Patient presents with   Atrial Fibrillation   Fall       HPI:  63 y.o. 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  NSGY consulted, but the tumor was inoperable, so he was discharged home with hospice.  Came back to the ER multiple times until 3/28 when he was discharged to [facility] due to high burden of needs not possible to manage at home.  Since then, he has returned on 4/4 for a fractured right humerus and confusion, and now today (with confusion, agitation, refusing cares, arm swelling).  In the ER, Afib with RVR, glucose 462 mg/dL, BP 657 mmHg systolic.  CTH showed known tumor, no acute change. Cr at baseline.  LFts trending up, WBC slightly up.      Review of Systems  Unable to perform ROS: Mental status change     Past Medical History:  Diagnosis Date   Aortic aneurysm (HCC) 2019   Chronic back pain    Cold    recent rx   Coronary artery disease    Degeneration of lumbar intervertebral disc    Diabetes mellitus    GERD (gastroesophageal reflux disease)    Gout    History of kidney stones    Hypertension    Paroxysmal atrial fibrillation (HCC)    Pneumonia    Past Surgical History:  Procedure Laterality Date   BACK SURGERY     BIOPSY  11/02/2023   Procedure: BIOPSY;  Surgeon: Franky Macho, MD;  Location: [facility]  ENDO SUITE;  Service: Endoscopy;;   FLEXIBLE SIGMOIDOSCOPY N/A 11/02/2023   Procedure: FLEXIBLE SIGMOIDOSCOPY;  Surgeon: Franky Macho, MD;  Location: [facility] ENDO [facility];  Service: Endoscopy;  Laterality: N/A;   IRRIGATION AND DEBRIDEMENT ABSCESS N/A 07/15/2019   Procedure: IRRIGATION AND DRAINAGE OF PERINEAL ABSCESS;  Surgeon: Franky Macho, MD;  Location: [facility] ORS;  Service: General;  Laterality: N/A;   MAXIMUM ACCESS (MAS)POSTERIOR LUMBAR INTERBODY FUSION (PLIF) 1 LEVEL  11/20/2013   Procedure: FOR MAXIMUM ACCESS (MAS) POSTERIOR LUMBAR INTERBODY FUSION LUMBAR FIVE-SACRAL ONE;  Surgeon: Tia Alert, MD;  Location: [facility] NEURO ORS;  Service: Neurosurgery;;  FOR MAXIMUM ACCESS (MAS) POSTERIOR LUMBAR INTERBODY FUSION LUMBAR FIVE-SACRAL ONE   TONSILLECTOMY     Social History:  reports that he has quit smoking. His smoking use included cigarettes. He has a 5 pack-year smoking history. He has never used smokeless tobacco. He reports current alcohol use of about 14.0 standard drinks of alcohol per week. He reports that he does not use drugs.  Allergies  Allergen Reactions   Celebrex [Celecoxib] Anaphylaxis    Not listed on MAR   Guaifenesin Anaphylaxis    Not listed on MAR   Ibuprofen Hives and Other (See Comments)    GI bleeding OTC NSAIDS cause blood in stools Not listed on [facility]  Tramadol Hives and Rash   Acetaminophen Other (See Comments)    Liver issues, not listed on mar   Aspirin Other (See Comments)    Pt was told not to take by MD due to being on Colchicine. - Patient is currently not on colchicine. not listed on mar   Ketorolac Hives    Blisters between fingers Not listed on MAR   Nsaids Other (See Comments)    Rectal bleeding, Not listed on MAR   Toradol [Ketorolac Tromethamine] Other (See Comments)    Blisters between fingers, not listed on MAR    Family History  Problem Relation Age of Onset   Colon cancer Neg Hx     Prior to Admission medications   Medication Sig Start Date  End Date Taking? Authorizing Provider  barrier cream (NON-SPECIFIED) CREA Apply 1 Application topically in the morning and at bedtime.   Yes [provider]  diltiazem (CARDIZEM CD) 180 MG 24 hr capsule Take 1 capsule (180 mg total) by mouth daily. 03/03/24  Yes Elgergawy, Leana Roe, MD  diltiazem (CARDIZEM) 60 MG tablet Take 60 mg by mouth every 8 (eight) hours as needed (HR >100).   Yes [provider]  finasteride (PROSCAR) 5 MG tablet Take 1 tablet (5 mg total) by mouth daily. 03/03/24 03/03/25 Yes Elgergawy, Leana Roe, MD  insulin aspart (NOVOLOG) 100 UNIT/ML injection Inject 0-20 Units into the skin 3 (three) times daily with meals. Patient taking differently: Inject 2-10 Units into the skin See admin instructions. Inject insulin subcutaneously three times a day with meals per sliding scale. Inject 5 units of insulin subcutaneously each night at bedtime. Sliding Scale: 0-200: 2 units 201-250: 4 units 251-300: 6 units 301-350: 8 units  351-400: 10 units  > 400 call MD. 03/22/24  Yes Johnson, Clanford L, MD  insulin glargine (LANTUS) 100 UNIT/ML Solostar Pen Inject 45 Units into the skin daily. Patient taking differently: Inject 32 Units into the skin every 12 (twelve) hours. 03/23/24  Yes Johnson, Clanford L, MD  levETIRAcetam (KEPPRA) 500 MG tablet Take 1 tablet (500 mg total) by mouth 2 (two) times daily. 03/03/24  Yes Elgergawy, Leana Roe, MD  LORazepam (ATIVAN) 1 MG tablet Take 1 tablet (1 mg total) by mouth every 6 (six) hours as needed for anxiety or seizure. Patient taking differently: Take 1 mg by mouth every 6 (six) hours as needed for anxiety, sleep or sedation. 03/22/24 03/22/25 Yes Johnson, Clanford L, MD  methadone (DOLOPHINE) 5 MG tablet Take 1 tablet (5 mg total) by mouth 3 (three) times daily. Patient taking differently: Take 10 mg by mouth 3 (three) times daily. 03/22/24  Yes Johnson, Clanford L, MD  ondansetron (ZOFRAN) 4 MG tablet Take 1 tablet (4 mg total) by mouth  every 6 (six) hours as needed for nausea. Patient taking differently: Take 4 mg by mouth every 6 (six) hours as needed for nausea, vomiting or refractory nausea / vomiting. 03/03/24  Yes Elgergawy, Leana Roe, MD  Oxycodone HCl 10 MG TABS Take 1 tablet (10 mg total) by mouth every 4 (four) hours as needed (breakthrough pain). 03/22/24  Yes Johnson, Clanford L, MD  pantoprazole (PROTONIX) 40 MG tablet Take 1 tablet (40 mg total) by mouth 2 (two) times daily. 03/03/24  Yes Elgergawy, Leana Roe, MD  QUEtiapine (SEROQUEL) 50 MG tablet Take 1 tablet (50 mg total) by mouth every 6 (six) hours. Patient taking differently: Take 50 mg by mouth every 6 (six) hours as needed (Anxiety/Agitation). 03/22/24  Yes Johnson, Clanford L, MD  senna (SENOKOT) 8.6 MG TABS tablet Take 1 tablet by mouth in the morning and at bedtime.   Yes [provider]  tamsulosin (FLOMAX) 0.4 MG CAPS capsule Take 1 capsule (0.4 mg total) by mouth daily. 03/03/24  Yes Elgergawy, Leana Roe, MD    Physical Exam: Vitals:   04/04/24 1211 04/04/24 1230 04/04/24 1300 04/04/24 1407  BP:  (!) 157/84 (!) 144/96 (!) 164/100  Pulse:  (!) 120 (!) 59 (!) 135  Resp:  16 11 (!) 22  Temp: 98.4 F (36.9 C)   98.4 F (36.9 C)  TempSrc: Axillary     SpO2:  100% 100% 98%  Weight:      Height:       Large obese adult male, lying in bed, makes eye contact, but babbles incoherently Anicteric, conjunctiva pink, lids and lashes normal, oropharynx very dry, no oral lesions Tachycardic, irregular, no murmurs, diffuse pitting edema Respiratory rate seems normal, lung sounds diminished, no rales appreciated The right arm is bruised and somewhat swollen, no obvious deformity Abdomen soft, no grimace to palpation, no guarding He makes eye contact, but is inattentive, distracted, rambles incoherently, oriented to himself and his sister, but not place, situation, unable to cooperate with cares.    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  Will monitor oral intake over the next 24 hours, if his oral intake dwindles, he may need to transition to [facility] - Follow with [facility]    Acute metabolic encephalopathy Due to tumor, end stage cancer, failure to thrive.  He did well on scheduled Seroquel, methadone prior to moving in to [place] (where he was getting Seroquel and oxycodone only PRN) - Comfort care - Schedule Seroquel - Schedule Ativan - Scheduled methadone - PRN oxycodone     Right humerus fracture - [facility]  Essential (primary) hypertension Coronary artery disease - [facility]  Atrial fibrillation with RVR (HCC) Chronic atrial fibrilliation - Continue diltiazem - PRN IV metoprolol  OSA on CPAP - [facility]  Obesity (BMI 30-39.9) Class I Complicates care  Chronic heart failure with preserved ejection fraction (HFpEF) (HCC)    Chronic kidney disease, stage 3a (HCC)    Uncontrolled type 2 diabetes mellitus with hyperglycemia, without long-term current use of insulin (HCC) - Continue glargine - Continue SS  corrections         Advance Care Planning: Comfort care only, DNR  Consults: Hospice  Family Communication: Sister-in-law/POA  Severity of Illness: The appropriate patient status for this patient is INPATIENT. Inpatient status is judged to be reasonable and necessary in order to provide the required intensity  of service to ensure the patient's safety. The patient's presenting symptoms, physical exam findings, and initial radiographic and laboratory data in the context of their chronic comorbidities is felt to place them at high risk for further clinical deterioration. Furthermore, it is not anticipated that the patient will be medically stable for discharge from the hospital within 2 midnights of admission.   * I certify that at the point of admission it is my clinical judgment that the patient will require inpatient hospital care spanning beyond 2 midnights from the point of admission due to high intensity of service, high risk for further deterioration and high frequency of surveillance required.*  Author: Alberteen Sam, MD 04/04/2024 4:43 PM  For on call review [email or web address].

## 2024-04-04 NOTE — Progress Notes (Signed)
 Patient is calm at this moment so patient's nurse and I took bilateral lower extremity restraints off patient.

## 2024-04-04 NOTE — ED Notes (Signed)
 Patient transported to CT

## 2024-04-04 NOTE — ED Provider Notes (Signed)
 [place] EMERGENCY DEPARTMENT AT [facility] Provider Note   CSN: [id number] Arrival date & time: 04/04/24  2841     History  Chief Complaint  Patient presents with   Atrial Fibrillation   Fall    Thomas Donovan is a 63 y.o. male.  HPI 63 year old male presents from his nursing facility with a potential fall.  He was found at his facility laying next to the bed that is just a mattress on the floor.  Unclear if he actually injured himself.  He has a chronic bruising to his right upper extremity from a fall a week ago where he broke his humerus.  Patient is currently calm though has been intermittently agitated since being in the ED.  He is found to be in A-fib with RVR and given IV Cardizem by EMS.  Patient has no acute complaints though is chronically altered.  I talked to his sister-in-law over the phone, Thomas Donovan, who is his Museum/gallery exhibitions officer.  She indicates that the patient is in hospice.  He chronically is altered, typically worst at night and usually is okay as long as he has his meds as they are scheduled which includes methadone, oxycodone, lorazepam.  However it is a little abnormal for him to be this agitated in the morning.  Sometimes being in the hospital will make him agitated.  She does indicate that his goals of care include treatment of potentially reversible things such as A-fib with RVR and she would want his heart rate controlled.  However he does have a DNR and would not want any drastic measures.  Home Medications Prior to Admission medications   Medication Sig Start Date End Date Taking? Authorizing Provider  barrier cream (NON-SPECIFIED) CREA Apply 1 Application topically in the morning and at bedtime.   Yes [provider]  diltiazem (CARDIZEM CD) 180 MG 24 hr capsule Take 1 capsule (180 mg total) by mouth daily. 03/03/24  Yes Elgergawy, Leana Roe, MD  diltiazem (CARDIZEM) 60 MG tablet Take 60 mg by mouth every 8 (eight) hours as  needed (HR >100).   Yes [provider]  finasteride (PROSCAR) 5 MG tablet Take 1 tablet (5 mg total) by mouth daily. 03/03/24 03/03/25 Yes Elgergawy, Leana Roe, MD  insulin aspart (NOVOLOG) 100 UNIT/ML injection Inject 0-20 Units into the skin 3 (three) times daily with meals. Patient taking differently: Inject 2-10 Units into the skin See admin instructions. Inject insulin subcutaneously three times a day with meals per sliding scale. Inject 5 units of insulin subcutaneously each night at bedtime. Sliding Scale: 0-200: 2 units 201-250: 4 units 251-300: 6 units 301-350: 8 units  351-400: 10 units  > 400 call MD. 03/22/24  Yes Johnson, Clanford L, MD  insulin glargine (LANTUS) 100 UNIT/ML Solostar Pen Inject 45 Units into the skin daily. Patient taking differently: Inject 32 Units into the skin every 12 (twelve) hours. 03/23/24  Yes Johnson, Clanford L, MD  levETIRAcetam (KEPPRA) 500 MG tablet Take 1 tablet (500 mg total) by mouth 2 (two) times daily. 03/03/24  Yes Elgergawy, Leana Roe, MD  LORazepam (ATIVAN) 1 MG tablet Take 1 tablet (1 mg total) by mouth every 6 (six) hours as needed for anxiety or seizure. Patient taking differently: Take 1 mg by mouth every 6 (six) hours as needed for anxiety, sleep or sedation. 03/22/24 03/22/25 Yes Johnson, Clanford L, MD  methadone (DOLOPHINE) 5 MG tablet Take 1 tablet (5 mg total) by mouth 3 (three) times daily. Patient  taking differently: Take 10 mg by mouth 3 (three) times daily. 03/22/24  Yes Johnson, Clanford L, MD  ondansetron (ZOFRAN) 4 MG tablet Take 1 tablet (4 mg total) by mouth every 6 (six) hours as needed for nausea. Patient taking differently: Take 4 mg by mouth every 6 (six) hours as needed for nausea, vomiting or refractory nausea / vomiting. 03/03/24  Yes Elgergawy, Leana Roe, MD  Oxycodone HCl 10 MG TABS Take 1 tablet (10 mg total) by mouth every 4 (four) hours as needed (breakthrough pain). 03/22/24  Yes Johnson, Clanford L, MD  pantoprazole  (PROTONIX) 40 MG tablet Take 1 tablet (40 mg total) by mouth 2 (two) times daily. 03/03/24  Yes Elgergawy, Leana Roe, MD  QUEtiapine (SEROQUEL) 50 MG tablet Take 1 tablet (50 mg total) by mouth every 6 (six) hours. Patient taking differently: Take 50 mg by mouth every 6 (six) hours as needed (Anxiety/Agitation). 03/22/24  Yes Johnson, Clanford L, MD  senna (SENOKOT) 8.6 MG TABS tablet Take 1 tablet by mouth in the morning and at bedtime.   Yes [provider]  tamsulosin (FLOMAX) 0.4 MG CAPS capsule Take 1 capsule (0.4 mg total) by mouth daily. 03/03/24  Yes Elgergawy, Leana Roe, MD      Allergies    Celebrex [celecoxib], Guaifenesin, Ibuprofen, Tramadol, Acetaminophen, Aspirin, Ketorolac, Nsaids, and Toradol [ketorolac tromethamine]    Review of Systems   Review of Systems  Unable to perform ROS: Mental status change    Physical Exam Updated Vital Signs BP (!) 164/100 (BP Location: Right Leg)   Pulse (!) 135   Temp 98.4 F (36.9 C)   Resp (!) 22   Ht 6\' 2"  (1.88 m)   Wt 112 kg   SpO2 98%   BMI 31.70 kg/m  Physical Exam Vitals and nursing note reviewed.  Constitutional:      Appearance: He is well-developed.  HENT:     Head: Normocephalic and atraumatic.  Cardiovascular:     Rate and Rhythm: Tachycardia present. Rhythm irregular.     Pulses:          Radial pulses are 2+ on the right side.     Heart sounds: Normal heart sounds.  Pulmonary:     Effort: Pulmonary effort is normal.     Breath sounds: Normal breath sounds. No wheezing.  Abdominal:     General: There is no distension.     Palpations: Abdomen is soft.     Tenderness: There is no abdominal tenderness.  Musculoskeletal:     Comments: Right upper extremity is diffusely swollen and ecchymotic  Skin:    General: Skin is warm and dry.  Neurological:     Mental Status: He is alert.     ED Results / Procedures / Treatments   Labs (all labs ordered are listed, but only abnormal results are displayed) Labs  Reviewed  COMPREHENSIVE METABOLIC PANEL WITH GFR - Abnormal; Notable for the following components:      Result Value   Glucose, Bld 462 (*)    BUN 31 (*)    Creatinine, Ser 1.28 (*)    Calcium 8.3 (*)    Total Protein 5.1 (*)    Albumin 2.3 (*)    ALT 56 (*)    Alkaline Phosphatase 138 (*)    Total Bilirubin 1.7 (*)    All other components within normal limits  CBC WITH DIFFERENTIAL/PLATELET - Abnormal; Notable for the following components:   WBC 13.3 (*)    RBC  3.65 (*)    Hemoglobin 9.5 (*)    HCT 30.5 (*)    RDW 18.8 (*)    Neutro Abs 12.4 (*)    Lymphs Abs 0.2 (*)    Abs Immature Granulocytes 0.11 (*)    All other components within normal limits  URINALYSIS, ROUTINE W REFLEX MICROSCOPIC - Abnormal; Notable for the following components:   Color, Urine AMBER (*)    APPearance HAZY (*)    Glucose, UA >=500 (*)    Protein, ur >=300 (*)    Leukocytes,Ua SMALL (*)    Bacteria, UA RARE (*)    All other components within normal limits  CBG MONITORING, ED - Abnormal; Notable for the following components:   Glucose-Capillary 376 (*)    All other components within normal limits  URINE CULTURE  TROPONIN I (HIGH SENSITIVITY)  TROPONIN I (HIGH SENSITIVITY)    EKG EKG Interpretation Date/Time:  Thursday April 04 2024 08:24:08 EDT Ventricular Rate:  125 PR Interval:    QRS Duration:  92 QT Interval:  314 QTC Calculation: 453 R Axis:   45  Text Interpretation: Atrial fibrillation with RVR Borderline low voltage, extremity leads Nonspecific T abnormalities, lateral leads similar to March 27 2024 Confirmed by Pricilla Loveless [phone]) on 04/04/2024 8:45:00 AM  Radiology CT Head Wo Contrast Result Date: 04/04/2024 CLINICAL DATA:  Head trauma.  Left middle cranial fossa tumor. EXAM: CT HEAD WITHOUT CONTRAST TECHNIQUE: Contiguous axial images were obtained from the base of the skull through the vertex without intravenous contrast. RADIATION DOSE REDUCTION: This exam was performed  according to the departmental dose-optimization program which includes automated exposure control, adjustment of the mA and/or kV according to patient size and/or use of iterative reconstruction technique. COMPARISON:  Head CT dated 03/27/2024. FINDINGS: Brain: Decrease in the left temporoparietal edema compared to prior CT. Decreased mass effect and compression of the left lateral ventricle. Approximally 4 mm left-to-right midline shift measured at the level of foramen Monro. 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 There is no acute intracranial hemorrhage. Vascular: No hyperdense vessel or unexpected calcification. Skull: Normal. Negative for fracture or focal lesion. Sinuses/Orbits: Diffuse mucoperiosteal thickening of paranasal sinuses. The mastoid air cells are clear. Other: None IMPRESSION: 1. No acute intracranial hemorrhage. 2. Decrease in the left temporoparietal edema and decreased mass effect and midline shift since the prior CT. 3. 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 Electronically Signed   By: Elgie Collard M.D.   On: 04/04/2024 11:44   DG Chest Portable 1 View Result Date: 04/04/2024 CLINICAL DATA:  AFib with RVR EXAM: PORTABLE CHEST 1 VIEW COMPARISON:  Chest x-ray performed March 27, 2024 FINDINGS: Heart mediastinum are not significantly changed. No focal infiltrate. No overt edema or pleural effusion. IMPRESSION: No active disease.  No significant interval change. Electronically Signed   By: Lowell Guitar M.D.   On: 04/04/2024 11:36    Procedures .Critical Care  Performed by: Pricilla Loveless, MD Authorized by: Pricilla Loveless, MD   Critical care provider statement:    Critical care time (minutes):  35   Critical care time was exclusive of:  Separately billable procedures and treating other patients   Critical care was necessary to treat or prevent imminent or life-threatening deterioration of the following conditions:  CNS failure or compromise  and cardiac failure   Critical care was time spent personally by me on the following activities:  Development of treatment plan with patient or surrogate,  discussions with consultants, evaluation of patient's response to treatment, examination of patient, ordering and review of laboratory studies, ordering and review of radiographic studies, ordering and performing treatments and interventions, pulse oximetry, re-evaluation of patient's condition and review of old charts     Medications Ordered in ED Medications  QUEtiapine (SEROQUEL) tablet 50 mg (has no administration in time range)  LORazepam (ATIVAN) injection 1 mg (0 mg Intravenous Hold 04/04/24 1549)  methadone (DOLOPHINE) tablet 10 mg (has no administration in time range)  metoprolol tartrate (LOPRESSOR) injection 10 mg (has no administration in time range)  sodium chloride 0.9 % bolus 1,000 mL (has no administration in time range)  oxyCODONE (Oxy IR/ROXICODONE) immediate release tablet 10 mg (has no administration in time range)  diltiazem (CARDIZEM CD) 24 hr capsule 180 mg (has no administration in time range)  metoprolol tartrate (LOPRESSOR) injection 5 mg (has no administration in time range)  insulin glargine-yfgn (SEMGLEE) injection 30 Units (has no administration in time range)  levETIRAcetam (KEPPRA) IVPB 500 mg/100 mL premix (500 mg Intravenous New Bag/Given 04/04/24 1547)  insulin aspart (novoLOG) injection 0-20 Units (has no administration in time range)  haloperidol (HALDOL) tablet 0.5 mg (has no administration in time range)    Or  haloperidol (HALDOL) 2 MG/ML solution 0.5 mg (has no administration in time range)    Or  haloperidol lactate (HALDOL) injection 0.5 mg (has no administration in time range)  prochlorperazine (COMPAZINE) tablet 5 mg (has no administration in time range)    Or  prochlorperazine (COMPAZINE) suppository 25 mg (has no administration in time range)    Or  prochlorperazine (COMPAZINE) injection 10 mg  (has no administration in time range)  antiseptic oral rinse (BIOTENE) solution 15 mL (has no administration in time range)  diltiazem (CARDIZEM) 1 mg/mL load via infusion 20 mg (20 mg Intravenous Bolus from Bag 04/04/24 0959)  haloperidol lactate (HALDOL) injection 5 mg (5 mg Intramuscular Given 04/04/24 0910)  cefTRIAXone (ROCEPHIN) 1 g in sodium chloride 0.9 % 100 mL IVPB (0 g Intravenous Stopped 04/04/24 1202)  LORazepam (ATIVAN) injection 2 mg (2 mg Intravenous Given 04/04/24 1108)  HYDROmorphone (DILAUDID) injection 1 mg (1 mg Intravenous Given 04/04/24 1208)  sodium chloride 0.9 % bolus 500 mL (0 mLs Intravenous Stopped 04/04/24 1244)    ED Course/ Medical Decision Making/ A&P                                 Medical Decision Making Amount and/or Complexity of Data Reviewed Labs: ordered.    Details: UTI. Radiology: ordered and independent interpretation performed.    Details: No CHF.  Brain edema from mass ECG/medicine tests: ordered and independent interpretation performed.    Details: A-fib with RVR  Risk Prescription drug management. Decision regarding hospitalization.   Patient was started on IV diltiazem bolus and drip.  He is in A-fib with RVR.  He is also quite agitated and required Haldol and later Ativan.  Sister-in-law also states that he gets this way with poor pain control and he is on methadone and oxycodone so he was given IV Dilaudid.  Upon my evaluation she is wanting treatment to see if anything can be improved but would not want any critical interventions.  Due to this Dr. Maryfrances Bunnell was consulted for admission.  He is a little more calm with the above interventions.        Final Clinical Impression(s) / ED Diagnoses Final  diagnoses:  Atrial fibrillation with RVR (HCC)  Acute urinary tract infection    Rx / DC Orders ED Discharge Orders     None         Pricilla Loveless, MD 04/04/24 [phone]

## 2024-04-04 NOTE — Hospital Course (Signed)
 63 y.o. 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  NSGY consulted, but the tumor was inoperable, so he was discharged home with hospice.  Came back to the ER multiple times until 3/28 when he was discharged to [facility] due to high burden of needs not possible to manage at home.  Since then, he has returned on 4/4 for a fractured right humerus and confusion, and now today (with confusion, agitation, refusing cares, arm swelling).  In the ER, Afib with RVR, glucose 462 mg/dL, BP 829 mmHg systolic.  CTH showed known tumor, no acute change. Cr at baseline.  LFts trending up, WBC slightly up.

## 2024-04-04 NOTE — Assessment & Plan Note (Addendum)
 Chronic atrial fibrilliation - Continue diltiazem - PRN IV metoprolol

## 2024-04-04 NOTE — Assessment & Plan Note (Addendum)
 Coronary artery disease - Comfort care

## 2024-04-04 NOTE — Assessment & Plan Note (Signed)
 Complicates care

## 2024-04-05 DIAGNOSIS — N390 Urinary tract infection, site not specified: Secondary | ICD-10-CM | POA: Diagnosis not present

## 2024-04-05 DIAGNOSIS — Z789 Other specified health status: Secondary | ICD-10-CM

## 2024-04-05 DIAGNOSIS — Z7189 Other specified counseling: Secondary | ICD-10-CM

## 2024-04-05 DIAGNOSIS — C7940 Secondary malignant neoplasm of unspecified part of nervous system: Secondary | ICD-10-CM | POA: Diagnosis not present

## 2024-04-05 DIAGNOSIS — Z515 Encounter for palliative care: Secondary | ICD-10-CM | POA: Diagnosis not present

## 2024-04-05 DIAGNOSIS — I4891 Unspecified atrial fibrillation: Secondary | ICD-10-CM | POA: Diagnosis not present

## 2024-04-05 DIAGNOSIS — C61 Malignant neoplasm of prostate: Secondary | ICD-10-CM | POA: Diagnosis not present

## 2024-04-05 DIAGNOSIS — Z66 Do not resuscitate: Secondary | ICD-10-CM

## 2024-04-05 LAB — URINE CULTURE

## 2024-04-05 LAB — GLUCOSE, CAPILLARY: Glucose-Capillary: 209 mg/dL — ABNORMAL HIGH (ref 70–99)

## 2024-04-05 MED ORDER — GLYCOPYRROLATE 0.2 MG/ML IJ SOLN: 0.2000 mg | INTRAMUSCULAR | Status: DC | PRN

## 2024-04-05 MED ORDER — POLYVINYL ALCOHOL 1.4 % OP SOLN: 1.0000 [drp] | OPHTHALMIC | Status: DC | PRN

## 2024-04-05 MED ORDER — ACETAMINOPHEN 650 MG RE SUPP
650.0000 mg | Freq: Four times a day (QID) | RECTAL | Status: DC | PRN
  Filled 2024-04-05: qty 1

## 2024-04-05 MED ORDER — LORAZEPAM 2 MG/ML IJ SOLN
1.0000 mg | INTRAMUSCULAR | Status: DC | PRN
  Administered 2024-04-05 – 2024-04-07 (×4): 1 mg via INTRAVENOUS
  Filled 2024-04-05 (×4): qty 1

## 2024-04-05 MED ORDER — BIOTENE DRY MOUTH MT LIQD
15.0000 mL | Freq: Two times a day (BID) | OROMUCOSAL | Status: DC
Start: 2024-04-05 — End: 2024-04-11
  Administered 2024-04-05 – 2024-04-11 (×13): 15 mL via TOPICAL

## 2024-04-05 MED ORDER — MORPHINE SULFATE (PF) 2 MG/ML IV SOLN
2.0000 mg | INTRAVENOUS | Status: DC | PRN
  Administered 2024-04-05 – 2024-04-07 (×5): 2 mg via INTRAVENOUS
  Filled 2024-04-05 (×5): qty 1

## 2024-04-05 NOTE — Plan of Care (Signed)
   Problem: Coping: Goal: Level of anxiety will decrease Outcome: Progressing

## 2024-04-05 NOTE — Progress Notes (Signed)
 [name] [id number] [facility] Patient Visit   Thomas Donovan is a current hospice patient, with hospice diagnosis of malignant neoplasm of the prostate. 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 Patient experienced a fall, extreme agitation and combative behavior at his facility. EMS was called to assist. Multiple EMS workers and facility staff were required to facilitate patient transport to [facility] ED. Per Dr. Patric Dykes, hospice physician, this is a related hospital admission. Visited with patient and sister-in-law at bedside. Patient is resting comfortably without restraints. Per sister-in-law, patient has been much less agitated with current medication regimen. Family wishes to pursue hospice IPU placement closer to their home with [facility]. Their evaluation is pending at this time.    Patient is GIP appropriate for needing IV medications and continued monitoring for effectiveness of interventions for acute metabolic encephalopathy due to tumor, end stage cancer.  Vital Signs: 98.1/127/17   137/92   O2 98% on 1 lpm [name]   I&O: 639.8/1,510   Abnormal labs: 04/04/24 09:05 COMPREHENSIVE METABOLIC PANEL WITH GFR:  Glucose: 462 (H) BUN: 31 (H) Creatinine: 1.28 (H) Calcium: 8.3 (L) Alkaline Phosphatase: 138 (H) Albumin: 2.3 (L) ALT: 56 (H) Total Protein: 5.1 (L) Total Bilirubin: 1.7 (H) WBC: 13.3 (H) RBC: 3.65 (L) Hemoglobin: 9.5 (L) HCT: 30.5 (L) RDW: 18.8 (H) NEUT#: 12.4 (H) Lymphs Abs: 0.2 (L) Abs Immature Granulocytes: 0.11 (H) URINALYSIS, ROUTINE W REFLEX MICROSCOPIC: Rpt ! Appearance: HAZY ! Bilirubin Urine: NEGATIVE Color, Urine: AMBER ! Glucose, UA: >=500 ! Hgb urine dipstick: NEGATIVE Ketones, ur: NEGATIVE Leukocytes,Ua: SMALL ! Nitrite: NEGATIVE pH: 5.0 Protein: >=300 ! Specific Gravity, Urine: 1.022 Bacteria, UA: RARE ! Hyaline Casts, UA:  PRESENT Mucus: PRESENT RBC / HPF: 0-5 Squamous Epithelial / HPF: 0-5 WBC, UA: 21-50 04/04/24 16:58 Glucose-Capillary: 326 (H)   Diagnostics:  CLINICAL DATA:  AFib with RVR   EXAM: PORTABLE CHEST 1 VIEW   COMPARISON:  Chest x-ray performed March 27, 2024   FINDINGS: Heart mediastinum are not significantly changed. No focal infiltrate. No overt edema or pleural effusion.   IMPRESSION: No active disease.  No significant interval change.    Electronically Signed   By: Lowell Guitar M.D.   On: 04/04/2024 11:36 CLINICAL DATA:  Head trauma.  Left middle cranial fossa tumor.   EXAM: CT HEAD WITHOUT CONTRAST   TECHNIQUE: Contiguous axial images were obtained from the base of the skull through the vertex without intravenous contrast.   RADIATION DOSE REDUCTION: This exam was performed according to the departmental dose-optimization program which includes automated exposure control, adjustment of the mA and/or kV according to patient size and/or use of iterative reconstruction technique.   COMPARISON:  Head CT dated 03/27/2024.   FINDINGS: Brain: Decrease in the left temporoparietal edema compared to prior CT. Decreased mass effect and compression of the left lateral ventricle. Approximally 4 mm left-to-right midline shift measured at the level of foramen Monro. 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 There is no acute intracranial hemorrhage.   Vascular: No hyperdense vessel or unexpected calcification.   Skull: Normal. Negative for fracture or focal lesion.   Sinuses/Orbits: Diffuse mucoperiosteal thickening of paranasal sinuses. The mastoid air cells are clear.   Other: None   IMPRESSION: 1. No acute intracranial hemorrhage. 2. Decrease in the left temporoparietal edema and decreased mass effect and midline shift since the prior CT. 3. Known left middle cranial  fossa tumor similar to prior CT.    Electronically Signed   By: Elgie Collard  M.D.   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   Will monitor oral intake over the next 24 hours, if his oral intake dwindles, he may need to transition to [facility] - Follow with [facility]    Acute metabolic encephalopathy Due to tumor, end stage cancer, failure to thrive.   He did well on scheduled Seroquel, methadone prior to moving in to [place] (where he was getting Seroquel and oxycodone only PRN) - Comfort care - Schedule Seroquel - Schedule Ativan - Scheduled methadone - PRN oxycodone    Right humerus fracture - Comfort care   Essential (primary) hypertension Coronary artery disease - Comfort care   Atrial fibrillation with RVR (HCC) Chronic atrial fibrilliation - Continue diltiazem - PRN IV metoprolol   OSA on CPAP - Comfort care   Obesity (BMI 30-39.9) Class I Complicates care   Chronic heart failure with preserved ejection fraction (HFpEF) (HCC)   Chronic kidney disease, stage 3a (HCC)   Uncontrolled type 2 diabetes mellitus with hyperglycemia, without long-term current use of insulin (HCC) - Continue glargine - Continue SS  corrections   Discharge Planning: ongoing possibly IPU placement   Family Contact: spoke with sister-in-law on phone and in room   IDT: Updated   Goals of Care: DNR   Should patient need ambulance transfer at discharge- please use [facility]) as they contract this service for our active hospice patients.   Henderson Newcomer, LPN [facility] Liaison [phone]

## 2024-04-05 NOTE — TOC Progression Note (Addendum)
 Transition of Care [facility]) - Progression Note    Patient Details  Name: Thomas Donovan MRN: [id number] Date of Birth: 21-Dec-1961  Transition of Care [facility]) CM/SW Contact  Erin Sons, [place] Phone Number: 04/05/2024, 11:55 AM  Clinical Narrative:      CSW notified by PMT that pts SIL Fleet Contras would like for pt to go to [place] hospice facility in [place]. Pt currently active with [facility] [place].   CSW called Fleet Contras and confirmed she is interested in [place] facility. CSW explained referral process and that TOC would follow up for disposition.  CSW spoke with Rae Halsted at [facility] and made referral. They will contact CSW once referral is reviewed.    1420: CSW received call from [place] and informed they had a nurse come assess pt today and that their medical director wants pt to be assessed again on Monday, 4/14, prior to making decision.            Social Determinants of Health (SDOH) Interventions SDOH Screenings   Food Insecurity: No Food Insecurity (04/04/2024)  Housing: Low Risk  (04/04/2024)  Transportation Needs: No Transportation Needs (04/04/2024)  Utilities: Not At Risk (04/04/2024)  Financial Resource Strain: Medium Risk (01/05/2023)   Received [facility]  Physical Activity: Inactive (10/04/2023)   Received from [facility]  Social Connections: Moderately Integrated (10/04/2023)   Received from [facility]  Stress: Stress Concern Present (10/04/2023)   Received from [facility]  Tobacco Use: Medium Risk (04/04/2024)  Health Literacy: Medium Risk (10/04/2023)   Received from [facility]    Readmission Risk Interventions     No data to display

## 2024-04-05 NOTE — Plan of Care (Signed)
  Problem: Clinical Measurements: Goal: Diagnostic test results will improve Outcome: Not Applicable   Problem: Nutrition: Goal: Adequate nutrition will be maintained Outcome: Progressing   Problem: Elimination: Goal: Will not experience complications related to bowel motility Outcome: Progressing Goal: Will not experience complications related to urinary retention Outcome: Progressing   Problem: Pain Managment: Goal: General experience of comfort will improve and/or be controlled Outcome: Not Progressing

## 2024-04-05 NOTE — Progress Notes (Signed)
 PROGRESS NOTE    Thomas Donovan  NUU:[id number] DOB: October 09, 1961 DOA: 04/04/2024 PCP: Pcp, No  Outpatient Specialists:     Brief Narrative:  As per H&P done on presentation: "63 y.o. 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  NSGY consulted, but the tumor was inoperable, so he was discharged home with hospice.   Came back to the ER multiple times until 3/28 when he was discharged to [facility] due to high burden of needs not possible to manage at home.   Since then, he has returned on 4/4 for a fractured right humerus and confusion, and now today (with confusion, agitation, refusing cares, arm swelling).   In the ER, Afib with RVR, glucose 462 mg/dL, BP 347 mmHg systolic.  CTH showed known tumor, no acute change. Cr at baseline.  LFts trending up, WBC slightly up".  04/05/2024: Patient seen.  Input from palliative care team is appreciated.  For comfort directed measures.  Patient is known to the hospice team.   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 - Comfort care    Right humerus fracture - Comfort care   Essential (primary) hypertension Coronary artery disease - Comfort care   Atrial fibrillation with RVR (HCC) Chronic atrial fibrilliation - Continue diltiazem - PRN IV metoprolol   OSA on CPAP - Comfort care   Obesity (BMI 30-39.9) Class I Complicates care   Chronic heart failure with preserved ejection fraction (HFpEF) (HCC)     Chronic kidney disease, stage 3a (HCC)     Uncontrolled type 2 diabetes mellitus with hyperglycemia, without long-term current use of insulin (HCC) - Continue glargine - Continue SS corrections   DVT prophylaxis: Comfort directed measures. Code Status: DO NOT RESUSCITATE.  Comfort measures. Family Communication:  Disposition Plan:    Consultants:  Palliative care team.  Procedures:  None.  Antimicrobials:  None.   Subjective: Is comfortable.  Objective: Vitals:   04/04/24 1407 04/05/24 0625 04/05/24 0913 04/05/24 0920  BP: (!) 164/100 (!) 164/123 (!) 124/99 (!) 137/92  Pulse: (!) 135 (!) (P) 143 (!) 162 (!) 127  Resp: (!) 22  17   Temp: 98.4 F (36.9 C) 98.9 F (37.2 C) 98.1 F (36.7 C)   TempSrc:  Oral Oral   SpO2: 98% 96% 98%  Weight:      Height:        Intake/Output Summary (Last 24 hours) at 04/05/2024 1005 Last data filed at 04/05/2024 0646 Gross per 24 hour  Intake 639.83 ml  Output 1510 ml  Net -870.17 ml   Filed Weights   04/04/24 0823  Weight: 112 kg    Examination:  General exam: Appears calm and comfortable.  Patient is obese. Respiratory system: Clear to auscultation. . Cardiovascular system: S1 & S2  Gastrointestinal system: Abdomen is soft and nontender. Central nervous  system: Awake and alert.     Data Reviewed: I have personally reviewed following labs and imaging studies  CBC: Recent Labs  Lab 04/04/24 0905  WBC 13.3*  NEUTROABS 12.4*  HGB 9.5*  HCT 30.5*  MCV 83.6  PLT 215   Basic Metabolic Panel: Recent Labs  Lab 04/04/24 0905  NA 137  K 4.0  CL 101  CO2 25  GLUCOSE 462*  BUN 31*  CREATININE 1.28*  CALCIUM 8.3*   GFR: Estimated Creatinine Clearance: 80.7 mL/min (A) (by C-G formula based on SCr of 1.28 mg/dL (H)). Liver Function Tests: Recent Labs  Lab 04/04/24 0905  AST 26  ALT 56*  ALKPHOS 138*  BILITOT 1.7*  PROT 5.1*  ALBUMIN 2.3*   No results for input(s): "LIPASE", "AMYLASE" in the last 168 hours. No results for input(s): "AMMONIA" in the last 168 hours. Coagulation Profile: No results for input(s): "INR", "PROTIME" in the last 168 hours. Cardiac Enzymes: No results for input(s): "CKTOTAL", "CKMB", "CKMBINDEX", "TROPONINI" in the last 168 hours. BNP (last 3 results) No results for input(s): "PROBNP" in the last 8760 hours. HbA1C: No results for input(s): "HGBA1C" in the last 72 hours. CBG: Recent Labs  Lab 04/04/24 0822 04/04/24 1658 04/05/24 0635  GLUCAP 376* 326* 209*   Lipid Profile: No results for input(s): "CHOL", "HDL", "LDLCALC", "TRIG", "CHOLHDL", "LDLDIRECT" in the last 72 hours. Thyroid Function Tests: No results for input(s): "TSH", "T4TOTAL", "FREET4", "T3FREE", "THYROIDAB" in the last 72 hours. Anemia Panel: No results for input(s): "VITAMINB12", "FOLATE", "FERRITIN", "TIBC", "IRON", "RETICCTPCT" in the last 72 hours. Urine analysis:    Component Value Date/Time   COLORURINE AMBER (A) 04/04/2024 0905   APPEARANCEUR HAZY (A) 04/04/2024 0905   LABSPEC 1.022 04/04/2024 0905   PHURINE 5.0 04/04/2024 0905   GLUCOSEU >=500 (A) 04/04/2024 0905   HGBUR NEGATIVE 04/04/2024 0905   BILIRUBINUR NEGATIVE 04/04/2024 0905   KETONESUR NEGATIVE 04/04/2024 0905   PROTEINUR >=300 (A) 04/04/2024 0905    UROBILINOGEN 0.2 01/01/2015 0912   NITRITE NEGATIVE 04/04/2024 0905   LEUKOCYTESUR SMALL (A) 04/04/2024 0905   Sepsis Labs: @LABRCNTIP (procalcitonin:4,lacticidven:4)  )No results found for this or any previous visit (from the past 240 hours).       Radiology Studies: CT Head Wo Contrast Result Date: 04/04/2024 CLINICAL DATA:  Head trauma.  Left middle cranial fossa tumor. EXAM: CT HEAD WITHOUT CONTRAST TECHNIQUE: Contiguous axial images were obtained from the base of the skull through the vertex without intravenous contrast. RADIATION DOSE REDUCTION: This exam was performed according to the departmental dose-optimization program which includes automated exposure control, adjustment of the mA and/or kV according to patient size and/or use of iterative reconstruction technique. COMPARISON:  Head CT dated 03/27/2024. FINDINGS: Brain: Decrease in the left temporoparietal edema compared to prior CT. Decreased mass effect and compression of the left lateral ventricle. Approximally 4 mm left-to-right midline shift measured at the level of foramen Monro. Known left middle cranial fossa tumor  similar to prior CT. There is no acute intracranial hemorrhage. Vascular: No hyperdense vessel or unexpected calcification. Skull: Normal. Negative for fracture or focal lesion. Sinuses/Orbits: Diffuse mucoperiosteal thickening of paranasal sinuses. The mastoid air cells are clear. Other: None IMPRESSION: 1. No acute intracranial hemorrhage. 2. Decrease in the left temporoparietal edema and decreased mass effect and midline shift since the prior CT. 3. 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 Electronically Signed   By: Elgie Collard M.D.   On: 04/04/2024 11:44   DG Chest Portable 1 View Result Date: 04/04/2024 CLINICAL DATA:  AFib with RVR EXAM: PORTABLE CHEST 1 VIEW COMPARISON:  Chest x-ray performed March 27, 2024 FINDINGS: Heart mediastinum are not significantly changed. No focal infiltrate. No  overt edema or pleural effusion. IMPRESSION: No active disease.  No significant interval change. Electronically Signed   By: Lowell Guitar M.D.   On: 04/04/2024 11:36        Scheduled Meds:  diltiazem  180 mg Oral Daily   methadone  10 mg Oral Q8H   metoprolol tartrate  10 mg Intravenous Once   Continuous Infusions:  levETIRAcetam 500 mg (04/05/24 0243)   sodium chloride       LOS: 1 day    Time spent: 35 minutes.    Berton Mount, MD  [facility] Pager #: [phone] 7PM-7AM contact night coverage as above

## 2024-04-05 NOTE — Consult Note (Signed)
 Consultation Note Date: 04/05/2024   Patient Name: Thomas Donovan  DOB: 1961/03/13  MRN: [id number]  Age / Sex: 63 y.o., male  PCP: Pcp, No Referring Physician: Barnetta Chapel, MD  Reason for Consultation: Establishing goals of care and Terminal Care, "goal of care/end of life issues "  HPI/Patient Profile: 63 y.o. male  with past medical history of obesity, Afib not on AC, CAD, HTN, DM, possible cirrhosis, known pelvic mass suspected prostate CA metastatic actually to liver and brain/skull/mandible, and recent right humeral fracture presented from [facility] after another fall and was admitted on 04/04/2024 with acute metabolic encephalopathy and afib with RVR. After discussions with family, patient was transitioned to full comfort measures at time of admission.  Of note, he has had 5 admissions and 5 ED visits in the last 6 months. He was recently enrolled in [facility] on 03/26/24.  Clinical Assessment and Goals of Care: I have reviewed medical records including EPIC notes, labs, any available advanced directives, and imaging. Received report from primary RN - no acute concerns. Per RN, patient is lethargic, not eating but drinking some when offered, still tolerating oral medications.   Went to visit patient at bedside - no family/visitors present. Patient was lying in bed - he does briefly wake to gentle touch as I remove the mitt from his hand. No signs or non-verbal gestures of pain or discomfort noted. No respiratory distress or increased work of breathing; secretions noted.   Called SIL/Rachel to discuss diagnosis, prognosis, GOC, EOL wishes, disposition, and options.  I introduced Palliative Medicine as specialized medical care for people living with serious illness. It focuses on providing relief from the symptoms and stress of a serious illness. The goal is to improve quality  of life for both the patient and the family.  Fleet Contras confirms goals for full comfort measures. She indicates "we don't want to see him suffer anymore." Fleet Contras also shares she is patient's legal guardian per previous incident with APS - requested documents. Therapeutic listening provided as she reflects on events since patient's admission - her husband/patient's brother is having a hard time, but also agrees that comfort care is primary goal at this time.  Very detailed discussion was had regarding dealing with the complex and emotionally intense issues of symptom management and palliative care in the setting of serious and life-threatening illness. Family were told that morphine "will kill him quicker." Education provided on morphine/opioid use at EOL - she expressed understanding.  Prognosis reviewed - likely days to <2 weeks.  Family are most interested in patient's transfer to the hospice home in [place], which is [facility]. Patient is currently enrolled with [facility] - will notify their liaison as well as TOC to assist with transfer.   Discussed with family the importance of continued conversation with each other and the medical providers regarding overall plan of care and treatment options, ensuring decisions are within the context of the patient's values and GOCs.    Questions and concerns were addressed. The patient/family was encouraged to call with questions and/or concerns. PMT number was provided.  Requested RN provide dose of robinul.   Primary Decision Maker: LEGAL GUARDIAN - Curt Bears - no documents on file, requested documents    SUMMARY OF RECOMMENDATIONS   Continue full comfort measures Continue DNR/DNI as previously documented Family interested in inpatient hospice transfer, requesting [facility]; however is currently enrolled with [facility] - TOC consult placed; TOC and hospice liaison notified Added orders for  EOL symptom management and to reflect full comfort  measures, as well as discontinued orders that were not focused on comfort Unrestricted visitation orders were placed per current [facility] EOL visitation policy  Nursing to provide frequent assessments and administer PRN medications as clinically necessary to ensure EOL comfort PMT will continue to follow and support holistically  Symptom Management Morphine PRN pain/dyspnea/increased work of breathing/RR>25 Tylenol PRN pain/fever Biotin twice daily Robinul PRN secretions Haldol PRN agitation/delirium Ativan PRN anxiety/seizure/sleep/distress Compazine PRN nausea/vomiting Liquifilm Tears PRN dry eye Continue keppra for seizure prophylaxis Continue methadone and diltiazem while tolerating POs    Code Status/Advance Care Planning: DNR  Palliative Prophylaxis:  Aspiration, Bowel Regimen, Delirium Protocol, Frequent Pain Assessment, Oral Care, and Turn Reposition  Additional Recommendations (Limitations, Scope, Preferences): Full Comfort Care  Psycho-social/Spiritual:  Desire for further Chaplaincy support:no Created space and opportunity for patient and family to express thoughts and feelings regarding patient's current medical situation.  Emotional support and therapeutic listening provided.  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 The following aspects are  pertinent.  Past Medical History:  Diagnosis Date   Aortic aneurysm (HCC) 2019   Chronic back pain    Cold    recent rx   Coronary artery disease    Degeneration of lumbar intervertebral disc    Diabetes mellitus    GERD (gastroesophageal reflux disease)    Gout    History of kidney stones    Hypertension    Paroxysmal atrial fibrillation (HCC)    Pneumonia    Social History   Socioeconomic History   Marital status: Married    Spouse name: Not on file   Number of children: Not on file   Years of education: Not on file   Highest education level: Not on file  Occupational History   Not on file  Tobacco Use   Smoking status: Former    Current packs/day: 0.50    Average packs/day: 0.5 packs/day for 10.0 years (5.0 ttl pk-yrs)    Types: Cigarettes   Smokeless tobacco: Never  Vaping Use   Vaping status: Never Used  Substance and Sexual Activity   Alcohol use: Yes    Alcohol/week: 14.0 standard drinks of alcohol    Types: 14 Cans of beer per week    Comment: 2 beers daily   Drug use: No   Sexual activity: Yes  Other Topics Concern   Not on file  Social History Narrative   Not on file   Social Drivers of Health   Financial Resource Strain: Medium Risk (01/05/2023)   Received [facility]   Overall Financial Resource Strain (CARDIA)    Difficulty of Paying Living Expenses: Somewhat hard  Food Insecurity: No Food Insecurity (04/04/2024)   Hunger Vital Sign    Worried About Running Out of Food in the Last Year: Never true    Ran Out of Food in the Last  Year: Never true  Transportation Needs: No Transportation Needs (04/04/2024)   PRAPARE - Administrator, Civil Service (Medical): No    Lack of Transportation (Non-Medical): No  Physical Activity: Inactive (10/04/2023)   Received from [facility]   Exercise Vital Sign    Days of Exercise per Week: 0 days    Minutes of Exercise per Session: 0 min  Stress: Stress Concern Present  (10/04/2023)   Received from [facility] - Occupational Stress Questionnaire    Feeling of Stress : Very much  Social Connections: Moderately Integrated (10/04/2023)   Received from [facility]   Social Connection and Isolation Panel [NHANES]    Frequency of Communication with Friends and Family: Once a week    Frequency of Social Gatherings with Friends and Family: Once a week    Attends Religious Services: 1 to 4 times per year    Active Member of [employer] or Organizations: No    Attends Engineer, structural: 1 to 4 times per year    Marital Status: Married   Family History  Problem Relation Age of Onset   Colon cancer Neg Hx    Scheduled Meds:  diltiazem  180 mg Oral Daily   methadone  10 mg Oral Q8H   metoprolol tartrate  10 mg Intravenous Once   Continuous Infusions:  levETIRAcetam 500 mg (04/05/24 0243)   sodium chloride     PRN Meds:.antiseptic oral rinse, [DISCONTINUED] haloperidol **OR** [DISCONTINUED] haloperidol **OR** haloperidol lactate, LORazepam, metoprolol tartrate, morphine CONCENTRATE, prochlorperazine **OR** prochlorperazine **OR** prochlorperazine Medications Prior to Admission:  Prior to Admission medications   Medication Sig Start Date End Date Taking? Authorizing Provider  barrier cream (NON-SPECIFIED) CREA Apply 1 Application topically in the morning and at bedtime.   Yes [provider]  diltiazem (CARDIZEM CD) 180 MG 24 hr capsule Take 1 capsule (180 mg total) by mouth daily. 03/03/24  Yes Elgergawy, Leana Roe, MD  diltiazem (CARDIZEM) 60 MG tablet Take 60 mg by mouth every 8 (eight) hours as needed (HR >100).   Yes [provider]  finasteride (PROSCAR) 5 MG tablet Take 1 tablet (5 mg total) by mouth daily. 03/03/24 03/03/25 Yes Elgergawy, Leana Roe, MD  insulin aspart (NOVOLOG) 100 UNIT/ML injection Inject 0-20 Units into the skin 3 (three) times daily with meals. Patient taking  differently: Inject 2-10 Units into the skin See admin instructions. Inject insulin subcutaneously three times a day with meals per sliding scale. Inject 5 units of insulin subcutaneously each night at bedtime. Sliding Scale: 0-200: 2 units 201-250: 4 units 251-300: 6 units 301-350: 8 units  351-400: 10 units  > 400 call MD. 03/22/24  Yes Johnson, Clanford L, MD  insulin glargine (LANTUS) 100 UNIT/ML Solostar Pen Inject 45 Units into the skin daily. Patient taking differently: Inject 32 Units into the skin every 12 (twelve) hours. 03/23/24  Yes Johnson, Clanford L, MD  levETIRAcetam (KEPPRA) 500 MG tablet Take 1 tablet (500 mg total) by mouth 2 (two) times daily. 03/03/24  Yes Elgergawy, Leana Roe, MD  LORazepam (ATIVAN) 1 MG tablet Take 1 tablet (1 mg total) by mouth every 6 (six) hours as needed for anxiety or seizure. Patient taking differently: Take 1 mg by mouth every 6 (six) hours as needed for anxiety, sleep or sedation. 03/22/24 03/22/25 Yes Johnson, Clanford L, MD  methadone (DOLOPHINE) 5 MG tablet Take 1 tablet (5 mg total) by mouth 3 (three)  times daily. Patient taking differently: Take 10 mg by mouth 3 (three) times daily. 03/22/24  Yes Johnson, Clanford L, MD  ondansetron (ZOFRAN) 4 MG tablet Take 1 tablet (4 mg total) by mouth every 6 (six) hours as needed for nausea. Patient taking differently: Take 4 mg by mouth every 6 (six) hours as needed for nausea, vomiting or refractory nausea / vomiting. 03/03/24  Yes Elgergawy, Leana Roe, MD  Oxycodone HCl 10 MG TABS Take 1 tablet (10 mg total) by mouth every 4 (four) hours as needed (breakthrough pain). 03/22/24  Yes Johnson, Clanford L, MD  pantoprazole (PROTONIX) 40 MG tablet Take 1 tablet (40 mg total) by mouth 2 (two) times daily. 03/03/24  Yes Elgergawy, Leana Roe, MD  QUEtiapine (SEROQUEL) 50 MG tablet Take 1 tablet (50 mg total) by mouth every 6 (six) hours. Patient taking differently: Take 50 mg by mouth every 6 (six) hours as needed  (Anxiety/Agitation). 03/22/24  Yes Johnson, Clanford L, MD  senna (SENOKOT) 8.6 MG TABS tablet Take 1 tablet by mouth in the morning and at bedtime.   Yes [provider]  tamsulosin (FLOMAX) 0.4 MG CAPS capsule Take 1 capsule (0.4 mg total) by mouth daily. 03/03/24  Yes Elgergawy, Leana Roe, MD   Allergies  Allergen Reactions   Celebrex [Celecoxib] Anaphylaxis    Not listed on MAR   Guaifenesin Anaphylaxis    Not listed on MAR   Ibuprofen Hives and Other (See Comments)    GI bleeding OTC NSAIDS cause blood in stools Not listed on MAR   Tramadol Hives and Rash   Acetaminophen Other (See Comments)    Liver issues, not listed on mar   Aspirin Other (See Comments)    Pt was told not to take by MD due to being on Colchicine. - Patient is currently not on colchicine. not listed on mar   Ketorolac Hives    Blisters between fingers Not listed on MAR   Nsaids Other (See Comments)    Rectal bleeding, Not listed on MAR   Toradol [Ketorolac Tromethamine] Other (See Comments)    Blisters between fingers, not listed on MAR   Review of Systems  Unable to perform ROS: Acuity of condition    Physical Exam Vitals and nursing note reviewed.  Constitutional:      General: He is not in acute distress.    Appearance: He is ill-appearing.  Pulmonary:     Effort: No respiratory distress.  Skin:    General: Skin is warm and dry.  Neurological:     Mental Status: He is lethargic.     Motor: Weakness present.     Vital Signs: BP (!) 137/92   Pulse (!) 127   Temp 98.1 F (36.7 C) (Oral)   Resp 17   Ht 6\' 2"  (1.88 m)   Wt 112 kg   SpO2 98%   BMI 31.70 kg/m  Pain Scale: Faces       SpO2: SpO2: 98 % O2 Device:SpO2: 98 % O2 Flow Rate: .   IO: Intake/output summary:  Intake/Output Summary (Last 24 hours) at 04/05/2024 1035 Last data filed at 04/05/2024 0646 Gross per 24 hour  Intake 639.83 ml  Output 1510 ml  Net -870.17 ml    LBM:   Baseline Weight: Weight: 112 kg Most  recent weight: Weight: 112 kg     Palliative Assessment/Data: PPS 20%     Time In: 1030 Time Out: 1145 Time Total: 75 minutes  Signed by: [employer]  Katrinka Blazing, NP   Please contact Palliative Medicine Team phone at [phone] for questions and concerns.  For individual provider: See Amion  *Portions of this note are a verbal dictation therefore any spelling and/or grammatical errors are due to the "[employer]agon [employer] One" system interpretation.

## 2024-04-06 DIAGNOSIS — R638 Other symptoms and signs concerning food and fluid intake: Secondary | ICD-10-CM

## 2024-04-06 DIAGNOSIS — N390 Urinary tract infection, site not specified: Secondary | ICD-10-CM | POA: Diagnosis not present

## 2024-04-06 DIAGNOSIS — I4891 Unspecified atrial fibrillation: Secondary | ICD-10-CM | POA: Diagnosis not present

## 2024-04-06 DIAGNOSIS — Z515 Encounter for palliative care: Secondary | ICD-10-CM | POA: Diagnosis not present

## 2024-04-06 DIAGNOSIS — C7940 Secondary malignant neoplasm of unspecified part of nervous system: Secondary | ICD-10-CM | POA: Diagnosis not present

## 2024-04-06 DIAGNOSIS — C61 Malignant neoplasm of prostate: Secondary | ICD-10-CM | POA: Diagnosis not present

## 2024-04-06 MED ORDER — PROCHLORPERAZINE MALEATE 5 MG PO TABS: 5.0000 mg | ORAL_TABLET | Freq: Four times a day (QID) | ORAL | Status: DC | PRN

## 2024-04-06 MED ORDER — PROCHLORPERAZINE EDISYLATE 10 MG/2ML IJ SOLN
10.0000 mg | Freq: Four times a day (QID) | INTRAMUSCULAR | Status: DC | PRN
  Administered 2024-04-06: 10 mg via INTRAVENOUS
  Filled 2024-04-06: qty 2

## 2024-04-06 MED ORDER — PROCHLORPERAZINE 25 MG RE SUPP: 25.0000 mg | Freq: Two times a day (BID) | RECTAL | Status: DC | PRN

## 2024-04-06 MED ORDER — GLYCOPYRROLATE 0.2 MG/ML IJ SOLN
0.2000 mg | Freq: Four times a day (QID) | INTRAMUSCULAR | Status: DC
  Administered 2024-04-06 – 2024-04-11 (×21): 0.2 mg via INTRAVENOUS
  Filled 2024-04-06 (×21): qty 1

## 2024-04-06 NOTE — Progress Notes (Signed)
 Daily Progress Note   Patient Name: Thomas Donovan       Date: 04/06/2024 DOB: [birth year]  Age: 63 y.o. MRN#: [id number] Attending Physician: Doroteo Gasmen, MD Primary Care Physician: Pcp, No Admit Date: 04/04/2024  Reason for Consultation/Follow-up: Non pain symptom management, Pain control, Psychosocial/spiritual support, and Terminal Care  Subjective: I have reviewed medical records including EPIC notes, MAR, any available advanced directives as necessary, and labs. Received report from primary RN -no acute concerns.  Went to visit patient at bedside -no family/visitors present.  He is lying in bed awake, alert, oriented to self only, and able to participate in simple conversation. No signs or non-verbal gestures of pain or discomfort noted. No respiratory distress or increased work of breathing; secretions noted.  Patient tells me he "does not feel too good."  He denies pain or shortness of breath.  He endorses nausea.  No other concerns.  Per documentation, and Alfonse Iha will reassess patient on Monday 4/14 for inpatient hospice facility.  Requested RN provide antiemetic.  Length of Stay: 2  Current Medications: Scheduled Meds:   antiseptic oral rinse  15 mL Topical BID   diltiazem  180 mg Oral Daily   methadone  10 mg Oral Q8H    Continuous Infusions:  levETIRAcetam 500 mg (04/06/24 0407)    PRN Meds: acetaminophen, glycopyrrolate, [DISCONTINUED] haloperidol **OR** [DISCONTINUED] haloperidol **OR** haloperidol lactate, LORazepam, morphine injection, polyvinyl alcohol, prochlorperazine **OR** prochlorperazine **OR** prochlorperazine  Physical Exam Vitals and nursing note reviewed.  Constitutional:      General: He is not in acute distress.    Appearance: He is  ill-appearing.  Pulmonary:     Effort: No respiratory distress.     Comments: Respiratory secretions Skin:    General: Skin is warm and dry.  Neurological:     Mental Status: He is alert. He is disoriented and confused.     Motor: Weakness present.  Psychiatric:        Attention and Perception: Attention normal.        Behavior: Behavior is cooperative.        Cognition and Memory: Cognition is impaired. Memory is impaired.             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) 04/04/2024   Right humerus fracture 04/04/2024   Steroid-induced hyperglycemia 03/20/2024   Gait instability 03/20/2024   Falls frequently 03/20/2024   Essential (primary) hypertension 03/20/2024   Cancer, metastatic to liver (HCC) 02/29/2024   Prostate cancer metastatic to multiple sites (HCC) 02/29/2024   Brain tumor (HCC) 02/27/2024   Acute metabolic encephalopathy 02/27/2024   Brain mass 02/27/2024   Atrial fibrillation with RVR (HCC) 11/28/2023   Grade II hemorrhoids 11/02/2023   GIB (gastrointestinal bleeding) 11/01/2023   Alcohol use 10/31/2023   Mass in rectum 10/31/2023   Elevated d-dimer 10/27/2023   Paroxysmal atrial fibrillation with RVR (HCC) 10/26/2023   Acute kidney injury superimposed on CKD (HCC) 10/26/2023   Hypoalbuminemia 10/26/2023   Gout 10/26/2023   Chronic heart  failure with preserved ejection fraction (HFpEF) (HCC) 10/26/2023   Drug abuse (HCC) 10/26/2023   Obesity (BMI 30-39.9) Class I 10/26/2023   Thrombocytosis 10/26/2023   OSA on CPAP 10/26/2023   Chronic back pain 10/26/2023   Chronic anticoagulation 09/21/2023   Guaiac positive stools 09/20/2023   Acute drug-induced gout of left foot 09/20/2023   Symptomatic anemia 09/18/2023   Acute on chronic blood loss anemia 09/18/2023   GI bleed 09/18/2023   Alcohol use disorder 09/18/2023   Cocaine abuse (HCC) 09/18/2023   Chronic kidney disease, stage 3a (HCC) 09/18/2023   BPH (benign prostatic hyperplasia) 09/18/2023   Hematuria 09/18/2023   Atrial fibrillation, chronic (HCC) 09/18/2023   Abscess of perineum    Tobacco use 08/22/2017   Hypokalemia 08/22/2017   GERD without esophagitis 08/22/2017   Uncontrolled type 2 diabetes mellitus with hyperglycemia, without long-term current use of insulin (HCC) 08/22/2017   Syncope 08/21/2017   Chest pain 08/21/2017   Dyspnea 08/21/2017   Hematochezia 08/21/2017   S/P lumbar spinal fusion 11/20/2013    Palliative Care Assessment & Plan   Patient Profile: 63 y.o. male  with past medical history of obesity, Afib not on AC, CAD, HTN, DM, possible cirrhosis, known pelvic mass suspected prostate CA metastatic actually to liver and brain/skull/mandible, and recent right humeral fracture presented from [facility] after another fall and was admitted on 04/04/2024 with acute metabolic encephalopathy and afib with RVR. After discussions with family, patient was transitioned to full comfort measures at time of admission.   Of note, he has had 5 admissions and 5 ED visits in the last 6 months. He was recently enrolled in [place] [facility] services at [facility] on 03/26/24.  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 [facility]) Active Problems:   Uncontrolled type 2 diabetes mellitus with hyperglycemia, without long-term  current use of insulin (HCC)   Chronic kidney disease, stage 3a (HCC)   Chronic heart failure with preserved ejection fraction (  HFpEF) (HCC)   Obesity (BMI 30-39.9) Class I   OSA on CPAP   Atrial fibrillation with RVR (HCC)   Acute metabolic encephalopathy   Cancer, metastatic to liver [facility])   Essential (primary) hypertension   Right humerus fracture   Terminal care  SUMMARY OF RECOMMENDATIONS   Continue full comfort measures Continue DNR/DNI as previously documented [facility] hospice facility will reevaluate patient on Monday 4/14 Comfort focused medication regimen adjusted as noted below PMT will continue to follow and support holistically   Symptom Management Morphine PRN pain/dyspnea/increased work of breathing/RR>25 Tylenol PRN pain/fever Biotin twice daily Scheduled Robinul every 6 hours; continue PRN dose for breakthrough secretions Haldol PRN agitation/delirium Ativan PRN anxiety/seizure/sleep/distress Compazine PRN nausea/vomiting Liquifilm Tears PRN dry eye Continue keppra for seizure prophylaxis Continue methadone and diltiazem while tolerating POs  Goals of Care and Additional Recommendations: Limitations on Scope of Treatment: Full Comfort Care  Code Status:    Code Status Orders  (From admission, onward)           Start     Ordered   04/04/24 1306  Do not attempt resuscitation (DNR) - Comfort care  Continuous       Question Answer Comment  If patient has no pulse and is not breathing Do Not Attempt Resuscitation   In Pre-Arrest Conditions (Patient Is Breathing and Has a Pulse) Provide comfort measures. Relieve any mechanical airway obstruction. Avoid transfer unless required for comfort.   Consent: Discussion documented in EHR or advanced directives reviewed      04/04/24 1306           Code Status History     Date Active Date Inactive Code Status Order ID Comments User Context   04/04/2024 1306 04/04/2024 1306 Do not attempt resuscitation (DNR) -  Comfort care [id number]  Ephriam Hashimoto, MD ED   03/17/2024 1429 03/22/2024 2202 Do not attempt resuscitation (DNR) - Comfort care [id number]  Guadalupe Lee, MD ED   02/29/2024 1803 03/03/2024 1901 Limited: Do not attempt resuscitation (DNR) -DNR-LIMITED -Do Not Intubate/DNI  [id number]  Epifanio Haste, MD Inpatient   02/27/2024 1224 02/29/2024 1803 Full Code [id number]  Maylene Spear, MD ED   11/28/2023 1611 11/30/2023 1859 Full Code [id number]  Demaris Fillers, MD ED   10/31/2023 1559 11/03/2023 1709 Full Code [id number]  Zierle-Ghosh, Asia B, DO ED   10/26/2023 2100 10/28/2023 1640 Full Code [id number]  Twilla Galea, DO ED   09/18/2023 0344 09/21/2023 1828 Full Code [id number]  Twilla Galea, DO Inpatient   08/21/2017 2352 08/23/2017 2020 Full Code [id number]  Ozell Blunt, MD ED   11/20/2013 1411 11/24/2013 1829 Full Code [id number]  Isadora Mar, MD Inpatient      Advance Directive Documentation    Flowsheet Row Most Recent Value  Type of Advance Directive Healthcare Power of Attorney  [Rachel Glahn-sister in law]  Pre-existing out of facility DNR order (yellow form or pink MOST form) --  "MOST" Form in Place? --       Prognosis:  < 2 weeks  Discharge Planning: Hospice facility  Care plan was discussed with primary RN  Thank you for allowing the Palliative Medicine Team to assist in the care of this patient.     Annette Killings, NP  Please contact Palliative Medicine Team phone at [phone] for questions and concerns.   *Portions of this note are a verbal dictation therefore any spelling and/or grammatical errors are due to the "Dragon [employer] One" system interpretation.

## 2024-04-06 NOTE — Progress Notes (Signed)
 PROGRESS NOTE    DAVIED NOCITO  ZOX:[id number] DOB: August 25, 1961 DOA: 04/04/2024 PCP: Pcp, No  Outpatient Specialists:     Brief Narrative:  As per H&P done on presentation: "63 y.o. 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  NSGY consulted, but the tumor was inoperable, so he was discharged home with hospice.   Came back to the ER multiple times until 3/28 when he was discharged to [facility] due to high burden of needs not possible to manage at home.   Since then, he has returned on 4/4 for a fractured right humerus and confusion, and now today (with confusion, agitation, refusing cares, arm swelling).   In the ER, Afib with RVR, glucose 462 mg/dL, BP 811 mmHg systolic.  CTH showed known tumor, no acute change. Cr at baseline.  LFts trending up, WBC slightly up".  04/05/2024: Patient seen.  Input from palliative care team is appreciated.  For comfort directed measures.  Patient is known to the hospice team.  04/06/2024: Patient seen.  Patient remains comfortable.  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 [facility]) Active Problems:   Uncontrolled type 2 diabetes mellitus with hyperglycemia, without long-term current use of insulin (HCC)   Chronic kidney disease, stage 3a (HCC)   Chronic heart failure with preserved ejection fraction (HFpEF) (HCC)   Obesity (BMI 30-39.9) Class I   OSA on CPAP   Atrial fibrillation with RVR (HCC)   Acute metabolic encephalopathy   Cancer,  metastatic to liver (HCC)   Essential (primary) hypertension   Right humerus fracture    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 - Comfort care    Right humerus fracture - Comfort care   Essential (primary) hypertension Coronary artery disease - Comfort care   Atrial fibrillation with RVR (HCC) Chronic atrial fibrilliation - Continue diltiazem - PRN IV metoprolol   OSA on CPAP - Comfort care   Obesity (BMI 30-39.9) Class I Complicates care   Chronic heart failure with preserved ejection fraction (HFpEF) (HCC)     Chronic kidney disease, stage 3a (HCC)     Uncontrolled type 2 diabetes mellitus with hyperglycemia, without long-term current use of insulin (HCC) - Continue glargine - Continue SS corrections   DVT prophylaxis: Comfort directed measures. Code Status: DO NOT RESUSCITATE.  Comfort measures. Family Communication:  Disposition Plan:    Consultants:  Palliative care team.  Procedures:  None.  Antimicrobials:  None.   Subjective: Is comfortable.  Objective: Vitals:   04/05/24 0920 04/05/24 1053 04/05/24 1752 04/06/24 0440  BP: (!) 137/92 (!) 141/97 (!) 133/107 (!) 145/88  Pulse: (!) 127 (!) 124 63 (!) 45  Resp:   17   Temp:   98.5 F (36.9 C) 99 F (37.2 C)  TempSrc:   Oral Oral  SpO2:   96% 94%  Weight:      Height:        Intake/Output Summary (Last 24 hours) at 04/06/2024 1826 Last data filed at 04/06/2024 0958 Gross per 24 hour  Intake 340 ml  Output --  Net 340 ml   Filed Weights   04/04/24 0823  Weight: 112 kg    Examination:  General exam: Appears calm and comfortable.  Patient is obese. Respiratory system: Clear to auscultation. . Cardiovascular system: S1 & S2  Gastrointestinal system: Abdomen  is soft and nontender. Central nervous system: Awake and alert.     Data Reviewed: I have personally reviewed following labs and imaging studies  CBC: Recent Labs  Lab 04/04/24 0905  WBC 13.3*  NEUTROABS 12.4*  HGB 9.5*  HCT 30.5*  MCV 83.6  PLT 215   Basic Metabolic Panel: Recent Labs  Lab 04/04/24 0905  NA 137  K 4.0  CL 101  CO2 25  GLUCOSE 462*  BUN 31*  CREATININE 1.28*  CALCIUM 8.3*   GFR: Estimated Creatinine Clearance: 80.7 mL/min (A) (by C-G formula based on SCr of 1.28 mg/dL (H)). Liver Function Tests: Recent Labs  Lab 04/04/24 0905  AST 26  ALT 56*  ALKPHOS 138*  BILITOT 1.7*  PROT 5.1*  ALBUMIN 2.3*   No results for input(s): "LIPASE", "AMYLASE" in the last 168 hours. No results for input(s): "AMMONIA" in the last 168 hours. Coagulation Profile: No results for input(s): "INR", "PROTIME" in the last 168 hours. Cardiac Enzymes: No results for input(s): "CKTOTAL", "CKMB", "CKMBINDEX", "TROPONINI" in the last 168 hours. BNP (last 3 results) No results for input(s): "PROBNP" in the last 8760 hours. HbA1C: No results for input(s): "HGBA1C" in the last 72 hours. CBG: Recent Labs  Lab 04/04/24 0822 04/04/24 1658 04/05/24 0635  GLUCAP 376* 326* 209*   Lipid Profile: No results for input(s): "CHOL", "HDL", "LDLCALC", "TRIG", "CHOLHDL", "LDLDIRECT" in the last 72 hours. Thyroid Function Tests: No results for input(s): "TSH", "T4TOTAL", "FREET4", "T3FREE", "THYROIDAB" in the last 72 hours. Anemia Panel: No results for input(s): "VITAMINB12", "FOLATE", "FERRITIN", "TIBC", "IRON", "RETICCTPCT" in the last 72 hours. Urine analysis:    Component Value Date/Time   COLORURINE AMBER (A) 04/04/2024 0905   APPEARANCEUR HAZY (A) 04/04/2024 0905   LABSPEC 1.022 04/04/2024 0905   PHURINE 5.0 04/04/2024 0905   GLUCOSEU >=500 (A) 04/04/2024 0905   HGBUR NEGATIVE 04/04/2024 0905   BILIRUBINUR NEGATIVE 04/04/2024 0905   KETONESUR NEGATIVE 04/04/2024 0905    PROTEINUR >=300 (A) 04/04/2024 0905   UROBILINOGEN 0.2 01/01/2015 0912   NITRITE NEGATIVE 04/04/2024 0905   LEUKOCYTESUR SMALL (A) 04/04/2024 0905   Sepsis Labs: @LABRCNTIP (procalcitonin:4,lacticidven:4)  ) Recent Results (from the past 240 hours)  Urine Culture     Status: Abnormal   Collection Time: 04/04/24 11:01 AM   Specimen: Urine, Clean Catch  Result Value Ref Range Status   Specimen Description URINE, CLEAN CATCH  Final   Special Requests   Final    NONE Performed at [facility], [address] [place], [place] [address]    Culture MULTIPLE SPECIES PRESENT, SUGGEST RECOLLECTION (A)  Final   Report Status 04/05/2024 FINAL  Final         Radiology Studies: No results found.       Scheduled Meds:  antiseptic oral rinse  15 mL Topical BID   diltiazem  180 mg Oral Daily   glycopyrrolate  0.2 mg Intravenous Q6H   methadone  10 mg Oral Q8H   Continuous Infusions:  levETIRAcetam 500 mg (04/06/24 1500)     LOS: 2 days    Time spent: 35 minutes.    Fonnie Iba, MD  [facility] Pager #: [phone] 7PM-7AM contact night coverage as above

## 2024-04-06 NOTE — Progress Notes (Signed)
 Thomas Donovan 1O10 [facility] Patient Visit   Thomas Donovan is a current hospice patient, with hospice diagnosis of malignant neoplasm of the prostate. He was admitted to [facility] with diagnosis of prostate cancer metastatic to central nervous system and atrial fibrillation. Patient experienced a fall, extreme agitation and combative behavior at his facility. EMS was called to assist. Multiple EMS workers and facility staff were required to facilitate patient transport to [facility] ED. Per Dr. Tessie Fila, hospice physician, this is a related hospital admission.  Visited with patient at bedside. No family present at time of my visit.  Patient is resting comfortably with eyes closed. Spoke with bedside nurse, who stated he would take off restraints now that patient has calmed down since having medication. Plan is to have patient re-evaluated by [facility] on Monday. Spoke with SIL via phone and she expressed that if patient not accepted by [facility], she would like to bring him home with [facility] services.     Patient is GIP appropriate for needing IV medications and continued monitoring for effectiveness of interventions for acute metabolic encephalopathy due to tumor, end stage cancer.   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 [facility]) Active Problems:   Uncontrolled type 2 diabetes mellitus with hyperglycemia, without long-term current use of insulin (HCC)   Chronic kidney disease, stage 3a (HCC)   Chronic heart failure with preserved ejection fraction (HFpEF) (HCC)   Obesity (BMI 30-39.9) Class I   OSA on CPAP    Atrial fibrillation with RVR (HCC)   Acute metabolic encephalopathy   Cancer, metastatic to liver (HCC)   Essential (primary) hypertension   Right humerus fracture      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 - Comfort care     Right humerus fracture - Comfort care   Essential (primary) hypertension Coronary artery disease - Comfort care   Atrial fibrillation with RVR (HCC) Chronic atrial fibrilliation - Continue diltiazem - PRN IV metoprolol   OSA on CPAP - Comfort care   Obesity (BMI 30-39.9) Class I Complicates care   Chronic heart failure with preserved ejection fraction (HFpEF) (HCC)     Chronic kidney disease, stage 3a (HCC)     Uncontrolled type 2 diabetes mellitus with hyperglycemia, without long-term current use of insulin (HCC) - Continue glargine - Continue SS corrections   Discharge Planning: ongoing possibly [facility] placement   Family Contact: spoke with sister-in-law on phone    IDT: Updated   Goals of Care: DNR   Should patient need ambulance transfer at discharge- please use [facility]) as they contract this service for our active hospice patients.   Jacqlyn Matas, BSN, [facility] Liaison [phone]

## 2024-04-07 DIAGNOSIS — Z515 Encounter for palliative care: Secondary | ICD-10-CM | POA: Diagnosis not present

## 2024-04-07 DIAGNOSIS — I4891 Unspecified atrial fibrillation: Secondary | ICD-10-CM | POA: Diagnosis not present

## 2024-04-07 DIAGNOSIS — N390 Urinary tract infection, site not specified: Secondary | ICD-10-CM | POA: Diagnosis not present

## 2024-04-07 DIAGNOSIS — C61 Malignant neoplasm of prostate: Secondary | ICD-10-CM | POA: Diagnosis not present

## 2024-04-07 DIAGNOSIS — R451 Restlessness and agitation: Secondary | ICD-10-CM

## 2024-04-07 DIAGNOSIS — C7940 Secondary malignant neoplasm of unspecified part of nervous system: Secondary | ICD-10-CM | POA: Diagnosis not present

## 2024-04-07 MED ORDER — LORAZEPAM 2 MG/ML IJ SOLN
2.0000 mg | INTRAMUSCULAR | Status: DC
  Administered 2024-04-07 – 2024-04-10 (×15): 2 mg via INTRAVENOUS
  Filled 2024-04-07 (×16): qty 1

## 2024-04-07 MED ORDER — LORAZEPAM 2 MG/ML IJ SOLN: 2.0000 mg | INTRAMUSCULAR | Status: DC | PRN

## 2024-04-07 MED ORDER — LORAZEPAM 2 MG/ML IJ SOLN
1.0000 mg | INTRAMUSCULAR | Status: DC
  Administered 2024-04-07: 1 mg via INTRAVENOUS
  Filled 2024-04-07: qty 1

## 2024-04-07 MED ORDER — LORAZEPAM 2 MG/ML IJ SOLN
2.0000 mg | INTRAMUSCULAR | Status: DC | PRN
  Administered 2024-04-09: 2 mg via INTRAVENOUS

## 2024-04-07 MED ORDER — MORPHINE SULFATE (PF) 2 MG/ML IV SOLN
2.0000 mg | INTRAVENOUS | Status: DC | PRN
  Administered 2024-04-09: 2 mg via INTRAVENOUS
  Filled 2024-04-07: qty 1

## 2024-04-07 NOTE — Plan of Care (Signed)
  Problem: Safety: Goal: Non-violent Restraint(s) Outcome: Not Applicable   Problem: Education: Goal: Knowledge of General Education information will improve Description: Including pain rating scale, medication(s)/side effects and non-pharmacologic comfort measures Outcome: Not Applicable   Problem: Nutrition: Goal: Adequate nutrition will be maintained Outcome: Not Progressing   Problem: Pain Managment: Goal: General experience of comfort will improve and/or be controlled Outcome: Not Progressing

## 2024-04-07 NOTE — Progress Notes (Signed)
 PROGRESS NOTE    Thomas Donovan  NWG:[id number] DOB: [birth year] DOA: 04/04/2024 PCP: Pcp, No  Outpatient Specialists:     Brief Narrative:  As per H&P done on presentation: "63 y.o. 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  NSGY consulted, but the tumor was inoperable, so he was discharged home with hospice.   Came back to the ER multiple times until 3/28 when he was discharged to [facility] due to high burden of needs not possible to manage at home.   Since then, he has returned on 4/4 for a fractured right humerus and confusion, and now today (with confusion, agitation, refusing cares, arm swelling).   In the ER, Afib with RVR, glucose 462 mg/dL, BP 578 mmHg systolic.  CTH showed known tumor, no acute change. Cr at baseline.  LFts trending up, WBC slightly up".  04/05/2024: Patient seen.  Input from palliative care team is appreciated.  For comfort directed measures.  Patient is known to the hospice team.  04/07/2024: Patient seen.  Patient remains comfortable.  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 [facility]) Active Problems:   Uncontrolled type 2 diabetes mellitus with hyperglycemia, without long-term current use of insulin (HCC)   Chronic kidney disease, stage 3a (HCC)   Chronic heart failure with preserved ejection fraction (HFpEF) (HCC)   Obesity (BMI 30-39.9) Class I   OSA on CPAP   Atrial fibrillation with RVR (HCC)   Acute metabolic encephalopathy   Cancer,  metastatic to liver (HCC)   Essential (primary) hypertension   Right humerus fracture    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 - Comfort care    Right humerus fracture - Comfort care   Essential (primary) hypertension Coronary artery disease - Comfort care   Atrial fibrillation with RVR (HCC) Chronic atrial fibrilliation - Continue diltiazem - PRN IV metoprolol   OSA on CPAP - Comfort care   Obesity (BMI 30-39.9) Class I Complicates care   Chronic heart failure with preserved ejection fraction (HFpEF) (HCC)     Chronic kidney disease, stage 3a (HCC)     Uncontrolled type 2 diabetes mellitus with hyperglycemia, without long-term current use of insulin (HCC) - Continue glargine - Continue SS corrections   DVT prophylaxis: Comfort directed measures. Code Status: DO NOT RESUSCITATE.  Comfort measures. Family Communication:  Disposition Plan:    Consultants:  Palliative care team.  Procedures:  None.  Antimicrobials:  None.   Subjective: Is comfortable.  Objective: Vitals:   04/05/24 0920 04/05/24 1053 04/05/24 1752 04/06/24 0440  BP: (!) 137/92 (!) 141/97 (!) 133/107 (!) 145/88  Pulse: (!) 127 (!) 124 63 (!) 45  Resp:   17   Temp:   98.5 F (36.9 C) 99 F (37.2 C)  TempSrc:   Oral Oral  SpO2:   96% 94%  Weight:      Height:       No intake or output data in the 24 hours ending 04/07/24 1929  Filed Weights   04/04/24 0823  Weight: 112 kg    Examination:  General exam: Appears calm and comfortable.  Patient is obese. Respiratory system: Clear to auscultation. . Cardiovascular system: S1 & S2  Gastrointestinal system: Abdomen is soft and nontender. Central nervous system: Awake and alert.     Data Reviewed: I have  personally reviewed following labs and imaging studies  CBC: Recent Labs  Lab 04/04/24 0905  WBC 13.3*  NEUTROABS 12.4*  HGB 9.5*  HCT 30.5*  MCV 83.6  PLT 215   Basic Metabolic Panel: Recent Labs  Lab 04/04/24 0905  NA 137  K 4.0  CL 101  CO2 25  GLUCOSE 462*  BUN 31*  CREATININE 1.28*  CALCIUM 8.3*   GFR: Estimated Creatinine Clearance: 80.7 mL/min (A) (by C-G formula based on SCr of 1.28 mg/dL (H)). Liver Function Tests: Recent Labs  Lab 04/04/24 0905  AST 26  ALT 56*  ALKPHOS 138*  BILITOT 1.7*  PROT 5.1*  ALBUMIN 2.3*   No results for input(s): "LIPASE", "AMYLASE" in the last 168 hours. No results for input(s): "AMMONIA" in the last 168 hours. Coagulation Profile: No results for input(s): "INR", "PROTIME" in the last 168 hours. Cardiac Enzymes: No results for input(s): "CKTOTAL", "CKMB", "CKMBINDEX", "TROPONINI" in the last 168 hours. BNP (last 3 results) No results for input(s): "PROBNP" in the last 8760 hours. HbA1C: No results for input(s): "HGBA1C" in the last 72 hours. CBG: Recent Labs  Lab 04/04/24 0822 04/04/24 1658 04/05/24 0635  GLUCAP 376* 326* 209*   Lipid Profile: No results for input(s): "CHOL", "HDL", "LDLCALC", "TRIG", "CHOLHDL", "LDLDIRECT" in the last 72 hours. Thyroid Function Tests: No results for input(s): "TSH", "T4TOTAL", "FREET4", "T3FREE", "THYROIDAB" in the last 72 hours. Anemia Panel: No results for input(s): "VITAMINB12", "FOLATE", "FERRITIN", "TIBC", "IRON", "RETICCTPCT" in the last 72 hours. Urine analysis:    Component Value Date/Time   COLORURINE AMBER (A) 04/04/2024 0905   APPEARANCEUR HAZY (A) 04/04/2024 0905   LABSPEC 1.022 04/04/2024 0905   PHURINE 5.0 04/04/2024 0905   GLUCOSEU >=500 (A) 04/04/2024 0905   HGBUR NEGATIVE 04/04/2024 0905   BILIRUBINUR NEGATIVE 04/04/2024 0905   KETONESUR NEGATIVE 04/04/2024 0905   PROTEINUR >=300 (A) 04/04/2024 0905   UROBILINOGEN 0.2 01/01/2015 0912   NITRITE NEGATIVE  04/04/2024 0905   LEUKOCYTESUR SMALL (A) 04/04/2024 0905   Sepsis Labs: @LABRCNTIP (procalcitonin:4,lacticidven:4)  ) Recent Results (from the past 240 hours)  Urine Culture     Status: Abnormal   Collection Time: 04/04/24 11:01 AM   Specimen: Urine, Clean Catch  Result Value Ref Range Status   Specimen Description URINE, CLEAN CATCH  Final   Special Requests   Final    NONE Performed at [facility], [address] [place], [place] [address]    Culture MULTIPLE SPECIES PRESENT, SUGGEST RECOLLECTION (A)  Final   Report Status 04/05/2024 FINAL  Final         Radiology Studies: No results found.       Scheduled Meds:  antiseptic oral rinse  15 mL Topical BID   diltiazem  180 mg Oral Daily   glycopyrrolate  0.2 mg Intravenous Q6H   LORazepam  2 mg Intravenous Q4H   methadone  10 mg Oral Q8H   Continuous Infusions:  levETIRAcetam 500  mg (04/07/24 1410)     LOS: 3 days    Time spent: 35 minutes.    Fonnie Iba, MD  [facility] Pager #: [phone] 7PM-7AM contact night coverage as above

## 2024-04-07 NOTE — Progress Notes (Signed)
 Thomas Donovan 1O10 [facility] Patient Visit   Thomas Donovan is a current hospice patient, with hospice diagnosis of malignant neoplasm of the prostate. He was admitted to [facility] with diagnosis of prostate cancer metastatic to central nervous system and atrial fibrillation. Patient experienced a fall, extreme agitation and combative behavior at his facility. EMS was called to assist. Multiple EMS workers and facility staff were required to facilitate patient transport to [facility] ED. Per Dr. Tessie Fila, hospice physician, this is a related hospital admission.   Visited with patient at bedside this morning. No family present at time of my visit.  Patient was resting comfortably with eyes closed. He did not have on restraints at the time of my visit. Noted to have received Haldol early this morning for agitation. Writer called sister in-law this afternoon to follow up on any concerns. Plan is to have patient re-evaluated by [facility] on Monday. Patient had foley cath placed this afternoon and seemed to be more agitated this afternoon, per conversation with sister in-law.    Patient is GIP appropriate for needing IV medications and continued monitoring for effectiveness of interventions for acute metabolic encephalopathy due to tumor, end stage cancer.   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 [facility]) Active Problems:   Uncontrolled type 2 diabetes mellitus with hyperglycemia, without long-term current use of insulin (HCC)   Chronic kidney disease, stage 3a (HCC)   Chronic heart failure with preserved ejection fraction (HFpEF) (HCC)    Obesity (BMI 30-39.9) Class I   OSA on CPAP   Atrial fibrillation with RVR (HCC)   Acute metabolic encephalopathy   Cancer, metastatic to liver (HCC)   Essential (primary) hypertension   Right humerus fracture      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 - Comfort care     Right humerus fracture - Comfort care   Essential (primary) hypertension Coronary artery disease - Comfort care   Atrial fibrillation with RVR (HCC) Chronic atrial fibrilliation - Continue diltiazem - PRN IV metoprolol   OSA on CPAP - Comfort care   Obesity (BMI 30-39.9) Class I Complicates care   Chronic heart failure with preserved ejection fraction (HFpEF) (HCC)     Chronic kidney disease, stage 3a (HCC)     Uncontrolled type 2 diabetes mellitus with hyperglycemia, without long-term current use of insulin (HCC) - Continue glargine - Continue SS corrections   Discharge Planning: ongoing possibly IPU placement   Family Contact: spoke with sister-in-law on phone    IDT: Updated   Goals of Care: DNR   Should patient need ambulance transfer at discharge- please use [facility]) as they contract this service for our active hospice patients.     Jacqlyn Matas, BSN, [facility] Liaison [phone]

## 2024-04-07 NOTE — Progress Notes (Addendum)
 Daily Progress Note   Patient Name: Thomas Donovan       Date: 04/07/2024 DOB: [birth year]  Age: 63 y.o. MRN#: [id number] Attending Physician: Doroteo Gasmen, MD Primary Care Physician: Pcp, No Admit Date: 04/04/2024  Reason for Consultation/Follow-up: Non pain symptom management, Pain control, Psychosocial/spiritual support, and Terminal Care  Subjective: I have reviewed medical records including EPIC notes, MAR, any available advanced directives as necessary, and labs. Noted multiple PRN medications utilized in the past 24 hours. Received report from primary RN - no acute concerns. RN reports patient has been very restless, pulling at lines and clothes. RN reports patient responds best to ativan compared to haldol or morphine.  Went to visit patient at bedside - no family/visitors present. Patient was lying in bed asleep - did not attempt to wake him to preserve comfort. No signs or non-verbal gestures of pain or discomfort noted. No respiratory distress, increased work of breathing, or secretions noted. Secretions improved from yesterday.  Per documentation, and Thomas Donovan will reassess patient on Monday 4/14 for inpatient hospice facility.   Will make medication adjustments.  Length of Stay: 3  Current Medications: Scheduled Meds:   antiseptic oral rinse  15 mL Topical BID   diltiazem  180 mg Oral Daily   glycopyrrolate  0.2 mg Intravenous Q6H   methadone  10 mg Oral Q8H    Continuous Infusions:  levETIRAcetam 500 mg (04/07/24 0255)    PRN Meds: acetaminophen, glycopyrrolate, [DISCONTINUED] haloperidol **OR** [DISCONTINUED] haloperidol **OR** haloperidol lactate, LORazepam, morphine injection, polyvinyl alcohol, prochlorperazine **OR** prochlorperazine **OR**  prochlorperazine  Physical Exam Vitals and nursing note reviewed.  Constitutional:      General: He is not in acute distress.    Appearance: He is ill-appearing.  Pulmonary:     Effort: No respiratory distress.  Skin:    General: Skin is warm and dry.             Vital Signs: BP (!) 145/88 (BP Location: Right Arm)   Pulse (!) 45   Temp 99 F (37.2 C) (Oral)   Resp 17   Ht 6\' 2"  (1.88 m)   Wt 112 kg   SpO2 94%   BMI 31.70 kg/m  SpO2: SpO2: 94 % O2 Device: O2 Device: Room Air O2 Flow Rate:    Intake/output summary:  Intake/Output  Summary (Last 24 hours) at 04/07/2024 0851 Last data filed at 04/06/2024 1330 Gross per 24 hour  Intake 240 ml  Output 450 ml  Net -210 ml   LBM:   Baseline Weight: Weight: 112 kg Most recent weight: Weight: 112 kg       Palliative Assessment/Data: PPS 10%      Patient Active Problem List   Diagnosis Date Noted   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 (HCC) 04/04/2024   Right humerus fracture 04/04/2024   Steroid-induced hyperglycemia 03/20/2024   Gait instability 03/20/2024   Falls frequently 03/20/2024   Essential (primary) hypertension 03/20/2024   Cancer, metastatic to liver (HCC) 02/29/2024   Prostate cancer metastatic to multiple sites (HCC) 02/29/2024   Brain tumor (HCC) 02/27/2024   Acute metabolic encephalopathy 02/27/2024   Brain mass 02/27/2024   Atrial fibrillation with RVR (HCC) 11/28/2023   Grade II hemorrhoids 11/02/2023   GIB (gastrointestinal bleeding) 11/01/2023   Alcohol use 10/31/2023   Mass in rectum 10/31/2023   Elevated d-dimer 10/27/2023   Paroxysmal atrial fibrillation with RVR (HCC) 10/26/2023   Acute kidney injury superimposed on CKD (HCC) 10/26/2023   Hypoalbuminemia 10/26/2023   Gout 10/26/2023   Chronic heart failure with preserved ejection fraction (HFpEF) (HCC) 10/26/2023   Drug abuse (HCC) 10/26/2023   Obesity (BMI 30-39.9) Class I 10/26/2023   Thrombocytosis 10/26/2023   OSA  on CPAP 10/26/2023   Chronic back pain 10/26/2023   Chronic anticoagulation 09/21/2023   Guaiac positive stools 09/20/2023   Acute drug-induced gout of left foot 09/20/2023   Symptomatic anemia 09/18/2023   Acute on chronic blood loss anemia 09/18/2023   GI bleed 09/18/2023   Alcohol use disorder 09/18/2023   Cocaine abuse (HCC) 09/18/2023   Chronic kidney disease, stage 3a (HCC) 09/18/2023   BPH (benign prostatic hyperplasia) 09/18/2023   Hematuria 09/18/2023   Atrial fibrillation, chronic (HCC) 09/18/2023   Abscess of perineum    Tobacco use 08/22/2017   Hypokalemia 08/22/2017   GERD without esophagitis 08/22/2017   Uncontrolled type 2 diabetes mellitus with hyperglycemia, without long-term current use of insulin (HCC) 08/22/2017   Syncope 08/21/2017   Chest pain 08/21/2017   Dyspnea 08/21/2017   Hematochezia 08/21/2017   S/P lumbar spinal fusion 11/20/2013    Palliative Care Assessment & Plan   Patient Profile: 63 y.o. male  with past medical history of obesity, Afib not on AC, CAD, HTN, DM, possible cirrhosis, known pelvic mass suspected prostate CA metastatic actually to liver and brain/skull/mandible, and recent right humeral fracture presented from [facility] after another fall and was admitted on 04/04/2024 with acute metabolic encephalopathy and afib with RVR. After discussions with family, patient was transitioned to full comfort measures at time of admission.   Of note, he has had 5 admissions and 5 ED visits in the last 6 months. He was recently enrolled in [facility] on 03/26/24.  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 [facility]) Active Problems:   Uncontrolled type 2 diabetes mellitus with hyperglycemia, without long-term current use of insulin (HCC)   Chronic kidney disease, stage 3a (HCC)   Chronic heart failure with preserved ejection fraction (HFpEF) (HCC)   Obesity (BMI 30-39.9) Class I    OSA on CPAP   Atrial fibrillation with RVR (HCC)   Acute metabolic encephalopathy   Cancer, metastatic to liver [facility])   Essential (primary) hypertension   Right humerus fracture   Terminal care  Recommendations/Plan:  Continue full comfort measures Continue DNR/DNI as previously documented [facility] hospice facility will reevaluate patient on Monday 4/14 Comfort focused medication regimen adjusted as noted below PMT will continue to follow and support holistically  Symptom Management Morphine PRN pain/dyspnea/increased work of breathing/RR>25 Tylenol PRN pain/fever Biotin twice daily Scheduled Robinul every 6 hours; continue PRN dose for breakthrough secretions Haldol PRN agitation/delirium Scheduled ativan q4h; PRN doses for anxiety/seizure/sleep/distress Compazine PRN nausea/vomiting Liquifilm Tears PRN dry eye Continue keppra for seizure prophylaxis Continue methadone and diltiazem while tolerating POs  **ADDENDUM** 3:52 PM Notified by RN that patient is still very restless and symptoms not adequately managed after administration of morphine, haldol, and ativan. Increased scheduled ativan to 2mg  q4h and increased PRN dose to 2-4mg . Increased morphine to dose range of 2-4mg  PRN. Per RN, ativan works best.  Goals of Care and Additional Recommendations: Limitations on Scope of Treatment: Full Comfort Care  Code Status:    Code Status Orders  (From admission, onward)           Start     Ordered   04/04/24 1306  Do not attempt resuscitation (DNR) - Comfort care  Continuous       Question Answer Comment  If patient has no pulse and is not breathing Do Not Attempt Resuscitation   In Pre-Arrest Conditions (Patient Is Breathing and Has a Pulse) Provide comfort measures. Relieve any mechanical airway obstruction. Avoid transfer unless required for comfort.   Consent: Discussion documented in EHR or advanced directives reviewed      04/04/24 1306           Code Status  History     Date Active Date Inactive Code Status Order ID Comments User Context   04/04/2024 1306 04/04/2024 1306 Do not attempt resuscitation (DNR) - Comfort care [id number]  Ephriam Hashimoto, MD ED   03/17/2024 1429 03/22/2024 2202 Do not attempt resuscitation (DNR) - Comfort care [id number]  Guadalupe Lee, MD ED   02/29/2024 1803 03/03/2024 1901 Limited: Do not attempt resuscitation (DNR) -DNR-LIMITED -Do Not Intubate/DNI  [id number]  Epifanio Haste, MD Inpatient   02/27/2024 1224 02/29/2024 1803 Full Code [id number]  Maylene Spear, MD ED   11/28/2023 1611 11/30/2023 1859 Full Code [id number]  Demaris Fillers, MD ED   10/31/2023 1559 11/03/2023 1709 Full Code [id number]  Zierle-Ghosh, Asia B, DO ED   10/26/2023 2100 10/28/2023 1640 Full Code [id number]  Twilla Galea, DO ED   09/18/2023 0344 09/21/2023 1828 Full Code [id number]  Twilla Galea, DO Inpatient   08/21/2017 2352 08/23/2017 2020 Full Code [id number]  Ozell Blunt, MD ED   11/20/2013 1411 11/24/2013 1829 Full Code [id number]  Isadora Mar, MD Inpatient      Advance Directive Documentation    Flowsheet Row Most Recent Value  Type of Advance Directive Healthcare Power of Attorney  [Rachel Barbary-sister in law]  Pre-existing out of facility DNR order (yellow form or pink MOST form) --  "MOST" Form in Place? --       Prognosis:  < 2 weeks  Discharge Planning: Hospice facility  Care plan was discussed with primary RN  Thank you for allowing the Palliative Medicine Team to assist in the care of this patient.     Annette Killings, NP  Please contact Palliative Medicine Team phone at [phone] for questions and concerns.   *Portions of this note are a verbal dictation therefore any spelling and/or grammatical errors are due to the "Dragon [employer] One" system interpretation.

## 2024-04-07 NOTE — Plan of Care (Signed)
  Problem: Safety: Goal: Non-violent Restraint(s) Outcome: Progressing   Problem: Education: Goal: Knowledge of General Education information will improve Description: Including pain rating scale, medication(s)/side effects and non-pharmacologic comfort measures Outcome: Not Progressing   Problem: Health Behavior/Discharge Planning: Goal: Ability to manage health-related needs will improve Outcome: Not Progressing   Problem: Clinical Measurements: Goal: Ability to maintain clinical measurements within normal limits will improve Outcome: Not Progressing   Problem: Safety: Goal: Non-violent Restraint(s) Outcome: Progressing

## 2024-04-08 DIAGNOSIS — C7940 Secondary malignant neoplasm of unspecified part of nervous system: Secondary | ICD-10-CM | POA: Diagnosis not present

## 2024-04-08 DIAGNOSIS — N390 Urinary tract infection, site not specified: Secondary | ICD-10-CM

## 2024-04-08 DIAGNOSIS — C61 Malignant neoplasm of prostate: Secondary | ICD-10-CM | POA: Diagnosis not present

## 2024-04-08 DIAGNOSIS — Z515 Encounter for palliative care: Principal | ICD-10-CM

## 2024-04-08 DIAGNOSIS — I4891 Unspecified atrial fibrillation: Secondary | ICD-10-CM | POA: Diagnosis not present

## 2024-04-08 LAB — GLUCOSE, CAPILLARY: Glucose-Capillary: 91 mg/dL (ref 70–99)

## 2024-04-08 NOTE — Care Management Important Message (Deleted)
 Important Message  Patient Details  Name: Thomas Donovan MRN: [id number] Date of Birth: 03/30/1961   Important Message Given:  Yes - Medicare IM  Pt under Comfort Measures   [name] 04/08/2024, 3:35 PM

## 2024-04-08 NOTE — Progress Notes (Signed)
 PROGRESS NOTE    Thomas Donovan  ZOX:[id number] DOB: [birth year] DOA: 04/04/2024 PCP: Pcp, No  Outpatient Specialists:     Brief Narrative:  Patient is a 63 year old male, obese, with past medical history significant for atrial fibrillation not on AC, CAD, HTN, DM, and possible cirrhosis.  Patient has known pelvic mass, suspected prostate CA, with metastasis to liver, brain, skull, mandible, with recent humeral fracutre on RIGHT.  Patient was previously admitted in early March from [place] with confusion, found to have a large skull base tumor and liver nodules, in addition to his known pelvic mass (from prostate CA).  Neurosurgery team was consulted, but the tumor was inoperable, so patient was discharged with hospice.  Patient has had multiple falls and will discharge to [facility] due to high burden of needs not possible to manage at home.  Patient returned on 03/29/2024 with fractured right humerus and confusion, agitation, refusing cares, arm swelling.  Palliative care team was consulted.  Patient is now full comfort.  04/08/2024: Patient remains comfortable.  Patient is awaiting evaluation for inpatient hospice on 04/10/2024 by a hospice team that may be closer to his home.  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 - Comfort care    Right humerus fracture - Comfort care   Essential (primary) hypertension Coronary artery disease - Comfort care   Atrial fibrillation with RVR (HCC) Chronic atrial fibrilliation - Continue diltiazem - PRN IV metoprolol   OSA on CPAP - Comfort care   Obesity (BMI 30-39.9) Class I Complicates care   Chronic heart failure with preserved ejection fraction (HFpEF) (HCC)     Chronic kidney disease, stage 3a (HCC)     Uncontrolled type 2 diabetes mellitus with hyperglycemia, without long-term current use of insulin (HCC) - Continue glargine - Continue SS corrections   DVT prophylaxis: Comfort directed measures. Code Status: DO NOT RESUSCITATE.  Comfort measures. Family Communication:  Disposition Plan:    Consultants:  Palliative care team.  Procedures:  None.  Antimicrobials:  None.   Subjective: Patient is comfortable.  Objective: Vitals:   04/05/24 1053 04/05/24 1752 04/06/24 0440 04/08/24 0856  BP: (!) 141/97 (!) 133/107 (!) 145/88 (!) 152/106  Pulse: (!) 124 63 (!) 45 82  Resp:  17  14  Temp:  98.5 F (36.9 C) 99 F (37.2 C) 98.2 F (36.8 C)  TempSrc:  Oral Oral Axillary  SpO2:  96% 94%   Weight:      Height:        Intake/Output Summary (Last 24 hours) at 04/08/2024 1448 Last data filed at 04/08/2024 1400 Gross per 24 hour  Intake 0 ml  Output 750 ml  Net -750 ml    [employer]  04/04/24 0823  Weight: 112 kg    Examination:  General exam: Appears calm and comfortable.  Patient is obese. Respiratory system: Clear to auscultation. . Cardiovascular system: S1 & S2  Gastrointestinal system: Abdomen is soft and nontender. Central nervous system: Awake and alert.     Data Reviewed: I have personally reviewed following labs and imaging studies  CBC: Recent Labs  Lab 04/04/24 0905  WBC 13.3*  NEUTROABS 12.4*  HGB  9.5*  HCT 30.5*  MCV 83.6  PLT 215   Basic Metabolic Panel: Recent Labs  Lab 04/04/24 0905  NA 137  K 4.0  CL 101  CO2 25  GLUCOSE 462*  BUN 31*  CREATININE 1.28*  CALCIUM 8.3*   GFR: Estimated Creatinine Clearance: 80.7 mL/min (A) (by C-G formula based on SCr of 1.28 mg/dL (H)). Liver Function Tests: Recent Labs  Lab 04/04/24 0905  AST 26  ALT 56*  ALKPHOS 138*  BILITOT 1.7*  PROT 5.1*  ALBUMIN 2.3*   No results for input(s): "LIPASE", "AMYLASE" in the last 168 hours. No results for input(s): "AMMONIA" in the last 168 hours. Coagulation Profile: No results for input(s): "INR", "PROTIME" in the last 168 hours. Cardiac Enzymes: No results for input(s): "CKTOTAL", "CKMB", "CKMBINDEX", "TROPONINI" in the last 168 hours. BNP (last 3 results) No results for input(s): "PROBNP" in the last 8760 hours. HbA1C: No results for input(s): "HGBA1C" in the last 72 hours. CBG: Recent Labs  Lab 04/04/24 0822 04/04/24 1658 04/05/24 0635 04/06/24 2046  GLUCAP 376* 326* 209* 91   Lipid Profile: No results for input(s): "CHOL", "HDL", "LDLCALC", "TRIG", "CHOLHDL", "LDLDIRECT" in the last 72 hours. Thyroid Function Tests: No results for input(s): "TSH", "T4TOTAL", "FREET4", "T3FREE", "THYROIDAB" in the last 72 hours. Anemia Panel: No results for input(s): "VITAMINB12", "FOLATE", "FERRITIN", "TIBC", "IRON", "RETICCTPCT" in the last 72 hours. Urine analysis:    Component Value Date/Time   COLORURINE AMBER (A) 04/04/2024 0905   APPEARANCEUR HAZY (A) 04/04/2024 0905   LABSPEC 1.022 04/04/2024 0905   PHURINE 5.0 04/04/2024 0905   GLUCOSEU >=500 (A) 04/04/2024 0905   HGBUR NEGATIVE 04/04/2024 0905   BILIRUBINUR NEGATIVE 04/04/2024 0905   KETONESUR NEGATIVE 04/04/2024 0905   PROTEINUR >=300 (A) 04/04/2024 0905   UROBILINOGEN 0.2 01/01/2015 0912   NITRITE NEGATIVE 04/04/2024 0905   LEUKOCYTESUR SMALL (A) 04/04/2024 0905   Sepsis  Labs: @LABRCNTIP (procalcitonin:4,lacticidven:4)  ) Recent Results (from the past 240 hours)  Urine Culture     Status: Abnormal   Collection Time: 04/04/24 11:01 AM   Specimen: Urine, Clean Catch  Result Value Ref Range Status   Specimen Description URINE, CLEAN CATCH  Final   Special Requests   Final    NONE Performed at [facility], [address], [place], [place] [address]    Culture MULTIPLE SPECIES PRESENT, SUGGEST RECOLLECTION (A)  Final   Report Status 04/05/2024 FINAL  Final         Radiology Studies: No results found.       Scheduled Meds:  antiseptic oral rinse  15 mL Topical BID   diltiazem  180 mg Oral Daily   glycopyrrolate  0.2 mg Intravenous Q6H   LORazepam  2 mg Intravenous Q4H   methadone  10 mg Oral Q8H   Continuous Infusions:  levETIRAcetam 500 mg (04/08/24 0200)     LOS: 4 days    Time spent: 35 minutes.    Berton Mount, MD  [facility] Pager #: [phone] 7PM-7AM contact  night coverage as above

## 2024-04-08 NOTE — Plan of Care (Signed)
  Problem: Clinical Measurements: Goal: Will remain free from infection Outcome: Progressing   Problem: Coping: Goal: Level of anxiety will decrease Outcome: Progressing   Problem: Health Behavior/Discharge Planning: Goal: Ability to manage health-related needs will improve Outcome: Not Progressing   Problem: Clinical Measurements: Goal: Ability to maintain clinical measurements within normal limits will improve Outcome: Not Progressing   Problem: Nutrition: Goal: Adequate nutrition will be maintained Outcome: Not Progressing

## 2024-04-08 NOTE — Progress Notes (Addendum)
 Daily Progress Note   Patient Name: Thomas Donovan       Date: 04/08/2024 DOB: [birth year]  Age: 63 y.o. MRN#: [id number] Attending Physician: Doroteo Gasmen, MD Primary Care Physician: Pcp, No Admit Date: 04/04/2024  Reason for Consultation/Follow-up: Non pain symptom management, Pain control, Psychosocial/spiritual support, and Terminal Care  Subjective: Medical records reviewed including progress notes, labs, imaging. Patient assessed at the bedside.  He is resting comfortably, in no acute distress.  His sister and another visitor are present.  MAR reviewed.  Patient has not required any as needed medications in the past 24 hours.  Family has no concerns about patient's comfort level.  No episodes of agitation during their visits.  They report he only had 1 sip of juice this morning then went back to sleep.  This is a big change from Saturday, when he had a good lunch. [facility] intake RN called during the visit and I spoke with her.  MD today would like to wait until 4/16 to reassess patient for inpatient hospice facility.  Discussed with family that given patient's need for symptom management and around-the-clock IV medication, he would not be able to be managed at [facility] adequately.  They agree and do not want him going there.  Discussed ongoing comfort focused care in the hospital until he can be reviewed for eligibility and hospice facility.  Discussed prognosis of 2 weeks or less in light of decreased oral intake, terminal agitation, several chronic comorbidities.  Provided education on the role of as needed opioid for pain control if he is unable to safely swallow his methadone.  Patient arouses at the end of our conversation and denies pain or distress.  Family plans to visit again on Saturday.  He fell back asleep after telling  them goodbye.  They request a call if it appears patient is actively dying while they are not present.  They do not want him to die alone.  Questions and concerns addressed. PMT will continue to support holistically.   Length of Stay: 4  Current Medications:   Physical Exam Vitals and nursing note reviewed.  Constitutional:      General: He is not in acute distress.    Appearance: He is ill-appearing.  Cardiovascular:     Rate and Rhythm: Normal rate.  Pulmonary:     Effort: No respiratory distress.  Skin:    General: Skin is warm and dry.  Neurological:     Mental Status: He is lethargic.  Psychiatric:        Cognition and Memory: Cognition is impaired.  Vital Signs: BP (!) 152/106 (BP Location: Left Wrist)   Pulse 82   Temp 98.2 F (36.8 C) (Axillary)   Resp 14   Ht 6\' 2"  (1.88 m)   Wt 112 kg   SpO2 94%   BMI 31.70 kg/m  SpO2: SpO2: 94 % O2 Device: O2 Device: Room Air O2 Flow Rate:         Palliative Assessment/Data: PPS 10-20%   Palliative Care Assessment & Plan   Patient Profile: 63 y.o. male  with past medical history of obesity, Afib not on AC, CAD, HTN, DM, possible cirrhosis, known pelvic mass suspected prostate CA metastatic actually to liver and brain/skull/mandible, and recent right humeral fracture presented from [facility] after another fall and was admitted on 04/04/2024 with acute metabolic encephalopathy and afib with RVR. After discussions with family, patient was transitioned to full comfort measures at time of admission.   Of note, he has had 5 admissions and 5 ED visits in the last 6 months. He was recently enrolled in [facility] on 03/26/24.  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 [facility]) Active Problems:   Uncontrolled type 2 diabetes mellitus with hyperglycemia, without long-term current use of insulin (HCC)   Chronic kidney disease, stage 3a (HCC)   Chronic  heart failure with preserved ejection fraction (HFpEF) (HCC)   Obesity (BMI 30-39.9) Class I   OSA on CPAP   Atrial fibrillation with RVR (HCC)   Acute metabolic encephalopathy   Cancer, metastatic to liver [facility])   Essential (primary) hypertension   Right humerus fracture   Terminal care  Recommendations/Plan: Continue DNR/DNI Continue comfort focused care, no adjustments required to medications today Per Alfonse Iha, plan is for hospice facility to reevaluate patient on Wednesday 4/16 Diet changed to soft at family's request Psychosocial and emotional support provided PMT will continue to follow and support holistically  Symptom Management Morphine PRN pain/dyspnea/increased work of breathing/RR>25 Tylenol PRN pain/fever Biotin twice daily Scheduled Robinul every 6 hours; continue PRN dose for breakthrough secretions Haldol PRN agitation/delirium Scheduled ativan q4h; PRN doses for anxiety/seizure/sleep/distress Compazine PRN nausea/vomiting Liquifilm Tears PRN dry eye Continue keppra for seizure prophylaxis Continue methadone and diltiazem while tolerating POs   Prognosis:  < 2 weeks  Discharge Planning: Hospice facility  Care plan was discussed with hospice RN, patient, patient's family    Yiselle Babich, PA-C Palliative Medicine Team Team phone # [phone]  Thank you for allowing the Palliative Medicine Team to assist in the care of this patient. Please utilize secure chat with additional questions, if there is no response within 30 minutes please call the above phone number.  Palliative Medicine Team providers are available by phone from 7am to 7pm daily and can be reached through the team cell phone.  Should this patient require assistance outside of these hours, please call the patient's attending physician.  Portions of this note are a verbal dictation therefore any spelling and/or grammatical errors are due to the "Dragon [employer] One" system interpretation.

## 2024-04-08 NOTE — Plan of Care (Signed)
  Problem: Coping: Goal: Level of anxiety will decrease Outcome: Progressing   Problem: Pain Managment: Goal: General experience of comfort will improve and/or be controlled Outcome: Progressing   Problem: Role Relationship: Goal: Family's ability to cope with current situation will improve Outcome: Progressing   Problem: Health Behavior/Discharge Planning: Goal: Ability to manage health-related needs will improve Outcome: Not Progressing   Problem: Activity: Goal: Risk for activity intolerance will decrease Outcome: Not Progressing   Problem: Nutrition: Goal: Adequate nutrition will be maintained Outcome: Not Progressing

## 2024-04-08 NOTE — Care Management Important Message (Signed)
 Important Message  Patient Details  Name: Thomas Donovan MRN: [id number] Date of Birth: [birth year]   Important Message Given:  No     [name] 04/08/2024, 3:43 PM

## 2024-04-08 NOTE — TOC Progression Note (Signed)
 Transition of Care [facility]) - Progression Note    Patient Details  Name: Thomas Donovan MRN: [id number] Date of Birth: 05-24-1961  Transition of Care [facility]) CM/SW Contact  Arron Big, [place] Phone Number: 04/08/2024, 2:03 PM  Clinical Narrative:   Per [facility]'s note, Plan is to have patient re-evaluated by [facility] on Wednesday for [facility] appropriateness.   TOC will continue to follow.    Social Determinants of Health (SDOH) Interventions SDOH Screenings   Food Insecurity: No Food Insecurity (04/04/2024)  Housing: Low Risk  (04/04/2024)  Transportation Needs: No Transportation Needs (04/04/2024)  Utilities: Not At Risk (04/04/2024)  Financial Resource Strain: Medium Risk (01/05/2023)   Received from [facility], [facility]  Physical Activity: Inactive (10/04/2023)   Received from [facility]  Social Connections: Moderately Integrated (10/04/2023)   [facility] from [facility]  Stress: Stress Concern Present (10/04/2023)   [facility] from [facility]  Tobacco Use: Medium Risk (04/04/2024)  Health Literacy: Medium Risk (10/04/2023)   Received from [facility]    Readmission Risk Interventions     No data to display

## 2024-04-08 NOTE — Progress Notes (Signed)
 4/14 Patient under Comfort Measures IMM respectfully not given.

## 2024-04-08 NOTE — Progress Notes (Signed)
 Thomas Donovan 1O10 [facility] Patient Visit   Thomas Donovan is a current hospice patient, with hospice diagnosis of malignant neoplasm of the prostate. He was admitted to [facility] with diagnosis of prostate cancer metastatic to central nervous system and atrial fibrillation. Patient experienced a fall, extreme agitation and combative behavior at his facility. EMS was called to assist. Multiple EMS workers and facility staff were required to facilitate patient transport to [facility] ED. Per Dr. Patric Dykes, hospice physician, this is a related hospital admission.   Visited with patient at bedside this morning. No family present at time of my visit.  Patient was resting comfortably with eyes closed. He did not have on restraints at the time of my visit but had one mitt on. Spoke with sister in-law to follow up on any concerns. Plan is to have patient re-evaluated by [facility] on Wednesday for [facility] appropriateness. Patient now on scheduled and PRN IV medications for pain and agitation.    Patient is GIP appropriate for needing IV medications and continued monitoring for effectiveness of interventions for acute metabolic encephalopathy due to tumor, end stage cancer.   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 [facility]) Active Problems:   Uncontrolled type 2 diabetes mellitus with hyperglycemia, without long-term current use of insulin (HCC)   Chronic kidney disease, stage 3a (HCC)   Chronic heart failure with preserved ejection fraction (HFpEF) (HCC)   Obesity (BMI 30-39.9) Class I   OSA on CPAP   Atrial fibrillation  with RVR (HCC)   Acute metabolic encephalopathy   Cancer, metastatic to liver (HCC)   Essential (primary) hypertension   Right humerus fracture     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 - Comfort care    Right humerus fracture - Comfort care   Essential (primary) hypertension Coronary artery disease - Comfort care   Atrial fibrillation with RVR (HCC) Chronic atrial fibrilliation - Continue diltiazem - PRN IV metoprolol   OSA on CPAP - Comfort care   Obesity (BMI 30-39.9) Class I Complicates care   Chronic heart failure with preserved ejection fraction (HFpEF) (HCC)    Chronic kidney disease, stage 3a (HCC)    Uncontrolled type 2 diabetes mellitus with hyperglycemia, without long-term current use of insulin (HCC) - Continue glargine - Continue SS corrections   Discharge Planning: ongoing possibly IPU placement with [facility]   Family Contact: spoke with sister-in-law on phone    IDT: Updated   Goals of Care: DNR   Should patient need ambulance transfer at discharge- please use [facility]) as they contract this service for our active hospice patients.     Henderson Newcomer, LPN [facility] Liaison [phone]

## 2024-04-09 DIAGNOSIS — C61 Malignant neoplasm of prostate: Secondary | ICD-10-CM | POA: Diagnosis not present

## 2024-04-09 DIAGNOSIS — I4891 Unspecified atrial fibrillation: Secondary | ICD-10-CM | POA: Diagnosis not present

## 2024-04-09 DIAGNOSIS — C7940 Secondary malignant neoplasm of unspecified part of nervous system: Secondary | ICD-10-CM | POA: Diagnosis not present

## 2024-04-09 DIAGNOSIS — Z515 Encounter for palliative care: Secondary | ICD-10-CM | POA: Diagnosis not present

## 2024-04-09 DIAGNOSIS — N390 Urinary tract infection, site not specified: Secondary | ICD-10-CM | POA: Diagnosis not present

## 2024-04-09 NOTE — Plan of Care (Signed)
  Problem: Coping: Goal: Level of anxiety will decrease Outcome: Progressing   Problem: Safety: Goal: Ability to remain free from injury will improve Outcome: Progressing   Problem: Nutritional: Goal: Maintenance of adequate nutrition will improve Outcome: Progressing

## 2024-04-09 NOTE — Progress Notes (Signed)
 PROGRESS NOTE    Thomas Donovan  ACZ:[id number] DOB: 1961/03/03 DOA: 04/04/2024 PCP: Pcp, No    Brief Narrative:  Patient is a 63 year old male, obese, with past medical history of atrial fibrillation not on anticoagulation, coronary artery disease, hypertension, diabetes mellitus and possible cirrhosis with pelvic mass and suspected prostate cancer with liver brain skull mandible metastasis and recent humeral fracture on the right who was previously [facility] in [facility] from jail with confusion.  He was noted to have a large skull base  tumor and liver nodules, in addition to his known pelvic mass (from prostate CA).  Neurosurgery team was consulted, but the tumor was inoperable, so patient was discharged with hospice.  Patient has had multiple falls and returned on 03/29/2024 with fractured right humerus and confusion, agitation, refusing cares, arm swelling.  Palliative care team was consulted.  Patient is now full comfort. Patient is awaiting evaluation for inpatient hospice on 04/10/2024 by a hospice team that may be closer to his home.  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  Complains of mild pain in his body.   Acute metabolic encephalopathy -Due to metastatic disease, end stage cancer, failure to thrive. Currently on comfort  care   Right humerus fracture Currently on comfort care   Essential (primary) hypertension Coronary artery disease Continue comfort care   Atrial fibrillation with RVR (HCC) Chronic atrial fibrilliation Consultation   OSA on CPAP - Comfort care   Obesity (BMI 30-39.9) Class I Complicates care   Chronic heart failure with preserved ejection fraction (HFpEF) (HCC) Currently on comfort care.Chronic kidney disease, stage 3a (HCC)    Uncontrolled type 2 diabetes mellitus with hyperglycemia, without long-term current use of insulin (HCC) Continue comfort care   DVT prophylaxis: None  Code Status: DO NOT RESUSCITATE.  Comfort measures.  Family Communication: None at bedside  Disposition Plan:  Residential hospice, pending evaluation.  Consultants:  Palliative care team.  Procedures:  None.  Antimicrobials:  None.   Subjective: Today, patient was seen and examined at bedside.  Slurred speech but still mildly interactive.  Complains of mild pain.  No shortness of breath dyspnea   Objective: Vitals:   04/05/24 1752 04/06/24 0440 04/08/24 0856 04/09/24 0553  BP: (!) 133/107 (!) 145/88 (!) 152/106 (!) 155/74  Pulse: 63 (!) 45 82 84  Resp: 17  14 16   Temp: 98.5 F (36.9 C) 99 F (37.2 C) 98.2 F (36.8 C)   TempSrc: Oral Oral Axillary   SpO2: 96% 94%  97%  Weight:      Height:        Intake/Output Summary (Last 24 hours) at 04/09/2024 0941 Last data filed at 04/09/2024 0650 Gross per 24 hour  Intake 797.87 ml  Output 950 ml  Net -152.13 ml    Filed Weights   04/04/24 0823  Weight:  112 kg    Examination:  General:  Average built, not in obvious distress, obese, mildly Communicative, slurred speech HENT:   No scleral pallor or icterus noted. Oral mucosa is moist.  Chest:  Clear breath sounds.  CVS: S1 &S2 heard. No murmur.  Regular rate and rhythm. Abdomen: Soft, nontender, nondistended.  Bowel sounds are heard.   Extremities: No cyanosis, clubbing  or edema.  Peripheral pulses are palpable. Generalized weakness noted   Data Reviewed: I have personally reviewed following labs and imaging studies  CBC: Recent Labs  Lab 04/04/24 0905  WBC 13.3*  NEUTROABS 12.4*  HGB 9.5*  HCT 30.5*  MCV 83.6  PLT 215   Basic Metabolic Panel: Recent Labs  Lab 04/04/24 0905  NA 137  K 4.0  CL 101  CO2 25  GLUCOSE 462*  BUN 31*  CREATININE 1.28*  CALCIUM 8.3*   GFR: Estimated Creatinine Clearance: 80.7 mL/min (A) (by C-G formula based on SCr of 1.28 mg/dL (H)). Liver Function Tests: Recent Labs  Lab 04/04/24 0905  AST 26  ALT 56*  ALKPHOS 138*  BILITOT 1.7*  PROT 5.1*  ALBUMIN 2.3*   No results for input(s): "LIPASE", "AMYLASE" in the last 168 hours. No results for input(s): "AMMONIA" in the last 168 hours. Coagulation Profile: No results for input(s): "INR", "PROTIME" in the last 168 hours. Cardiac Enzymes: No results for input(s): "CKTOTAL", "CKMB", "CKMBINDEX", "TROPONINI" in the last 168 hours. BNP (last 3 results) No results for input(s): "PROBNP" in the last 8760 hours. HbA1C: No results for input(s): "HGBA1C" in the last 72 hours. CBG: Recent Labs  Lab 04/04/24 0822 04/04/24 1658 04/05/24 0635 04/06/24 2046  GLUCAP 376* 326* 209* 91   Lipid Profile: No results for input(s): "CHOL", "HDL", "LDLCALC", "TRIG", "CHOLHDL", "LDLDIRECT" in the last 72 hours. Thyroid Function Tests: No results for input(s): "TSH", "T4TOTAL", "FREET4", "T3FREE", "THYROIDAB" in the last 72 hours. Anemia Panel: No results for input(s): "VITAMINB12", "FOLATE", "FERRITIN", "TIBC", "IRON", "RETICCTPCT" in the last 72 hours. Urine analysis:    Component Value Date/Time   COLORURINE AMBER (A) 04/04/2024 0905   APPEARANCEUR HAZY (A) 04/04/2024 0905   LABSPEC 1.022 04/04/2024 0905   PHURINE 5.0 04/04/2024 0905   GLUCOSEU >=500 (A) 04/04/2024 0905   HGBUR NEGATIVE 04/04/2024 0905   BILIRUBINUR NEGATIVE 04/04/2024 0905   KETONESUR  NEGATIVE 04/04/2024 0905   PROTEINUR >=300 (A) 04/04/2024 0905   UROBILINOGEN 0.2 01/01/2015 0912   NITRITE NEGATIVE 04/04/2024 0905   LEUKOCYTESUR SMALL (A) 04/04/2024 0905   Sepsis Labs: @LABRCNTIP (procalcitonin:4,lacticidven:4)  ) Recent Results (from the past 240 hours)  Urine Culture     Status: Abnormal   Collection Time: 04/04/24 11:01 AM   Specimen: Urine, Clean Catch  Result Value Ref Range Status   Specimen Description URINE, CLEAN CATCH  Final   Special Requests   Final    NONE Performed at [facility], [address] [place], [place] [address]    Culture MULTIPLE SPECIES PRESENT, SUGGEST RECOLLECTION (A)  Final   Report Status 04/05/2024 FINAL  Final         Radiology Studies: No results found.    Scheduled Meds:  antiseptic oral rinse  15 mL Topical BID   diltiazem  180 mg Oral Daily   glycopyrrolate  0.2 mg Intravenous Q6H   LORazepam  2 mg Intravenous Q4H   methadone  10 mg Oral Q8H   Continuous Infusions:  levETIRAcetam 500 mg (04/09/24 0354)     LOS:  5 days   Elvin Hammer, MD [facility]  04/09/2024

## 2024-04-09 NOTE — Progress Notes (Signed)
 Thomas Donovan 1O10 [facility] Patient Visit   Thomas Donovan is a current hospice patient, with hospice diagnosis of malignant neoplasm of the prostate. He was admitted to [facility] with diagnosis of prostate cancer metastatic to central nervous system and atrial fibrillation. Patient experienced a fall, extreme agitation and combative behavior at his facility. EMS was called to assist. Multiple EMS workers and facility staff were required to facilitate patient transport to [facility] ED. Per Dr. Patric Dykes, hospice physician, this is a related hospital admission.   Visited with patient at bedside this morning. No family present at time of my visit.  Patient was awake with eyes open and advised he was not in any pain or in need of any assistance. He did not have restraints on at the time of my visit but did have mitt on left hand. Spoke with sister in-law to follow up on any concerns. Plan is to have patient re-evaluated by [facility] on Wednesday for [facility] appropriateness.    Patient is GIP appropriate for needing IV medications and continued monitoring for effectiveness of interventions for acute metabolic encephalopathy due to tumor, end stage cancer.   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 [facility]) Active Problems:   Uncontrolled type 2 diabetes mellitus with hyperglycemia, without long-term current use of insulin (HCC)   Chronic kidney disease, stage 3a (HCC)   Chronic heart failure with preserved ejection fraction (HFpEF) (HCC)   Obesity (BMI 30-39.9) Class I   OSA on CPAP   Atrial fibrillation with RVR (HCC)   Acute metabolic  encephalopathy   Cancer, metastatic to liver (HCC)   Essential (primary) hypertension   Right humerus fracture     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 - Comfort care    Right humerus fracture - Comfort care   Essential (primary) hypertension Coronary artery disease - Comfort care   Atrial fibrillation with RVR (HCC) Chronic atrial fibrilliation - Continue diltiazem - PRN IV metoprolol   OSA on CPAP - Comfort care   Obesity (BMI 30-39.9) Class I Complicates care   Chronic heart failure with preserved ejection fraction (HFpEF) (HCC)    Chronic kidney disease, stage 3a (HCC)    Uncontrolled type 2 diabetes mellitus with hyperglycemia, without long-term current use of insulin (HCC) - Continue glargine - Continue SS corrections   Discharge Planning: ongoing, possibly IPU placement with [facility]   Family Contact: spoke with sister-in-law on phone    IDT: Updated   Goals of Care: DNR   Should patient need ambulance transfer at discharge- please use [facility]) as they contract this service for our active hospice patients.   Henderson Newcomer, LPN [facility] Liaison [phone]

## 2024-04-09 NOTE — Progress Notes (Signed)
 Daily Progress Note   Patient Name: Thomas Donovan       Date: 04/09/2024 DOB: Aug 05, 1961  Age: 63 y.o. MRN#: [id number] Attending Physician: Rosena Conradi, MD Primary Care Physician: Pcp, No Admit Date: 04/04/2024  Reason for Consultation/Follow-up: Non pain symptom management, Pain control, Psychosocial/spiritual support, and Terminal Care  Subjective: Medical records reviewed including progress notes, labs, imaging. Patient assessed at the bedside.  He is more restless today, reports 10 out of 10 headache.  MAR reviewed.  Patient has not required any as needed medications in the past 24 hours.  Discussed with RN, advised as needed morphine for symptom relief.  No family was present during visit.  Questions and concerns addressed. PMT will continue to support holistically.   Length of Stay: 5  Current Medications:   Physical Exam Vitals and nursing note reviewed.  Constitutional:      General: He is not in acute distress.    Appearance: He is ill-appearing.  Cardiovascular:     Rate and Rhythm: Normal rate.  Pulmonary:     Effort: No respiratory distress.  Skin:    General: Skin is warm and dry.  Neurological:     Mental Status: He is lethargic.  Psychiatric:        Cognition and Memory: Cognition is impaired.            Vital Signs: BP (!) 155/74 (BP Location: Left Arm)   Pulse 84   Temp 98.2 F (36.8 C) (Axillary)   Resp 16   Ht 6\' 2"  (1.88 m)   Wt 112 kg   SpO2 97%   BMI 31.70 kg/m  SpO2: SpO2: 97 % O2 Device: O2 Device: Room Air O2 Flow Rate:         Palliative Assessment/Data: PPS 10-20%   Palliative Care Assessment & Plan   Patient Profile: 63 y.o. male  with past medical history of obesity, Afib not on AC, CAD, HTN, DM, possible cirrhosis, known pelvic mass suspected prostate CA metastatic  actually to liver and brain/skull/mandible, and recent right humeral fracture presented from [facility] after another fall and was admitted on 04/04/2024 with acute metabolic encephalopathy and afib with RVR. After discussions with family, patient was transitioned to full comfort measures at time of admission.   Of note, he has had 5 admissions and 5 ED visits in the last 6 months. He was recently enrolled in [place] hospice services at LTC on 03/26/24.  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 [facility]) Active Problems:   Uncontrolled type 2 diabetes mellitus with hyperglycemia,  without long-term current use of insulin (HCC)   Chronic kidney disease, stage 3a (HCC)   Chronic heart failure with preserved ejection fraction (HFpEF) (HCC)   Obesity (BMI 30-39.9) Class I   OSA on CPAP   Atrial fibrillation with RVR (HCC)   Acute metabolic encephalopathy   Cancer, metastatic to liver [facility])   Essential (primary) hypertension   Right humerus fracture   Terminal care  Recommendations/Plan: Continue DNR/DNI Continue comfort focused care, no adjustments required to medications today Per Alfonse Iha, plan is for hospice facility to reevaluate patient on Wednesday 4/16 Give as needed morphine for acute pain today Psychosocial and emotional support provided PMT will continue to follow and support holistically  Symptom Management Morphine PRN pain/dyspnea/increased work of breathing/RR>25 Tylenol PRN pain/fever Biotin twice daily Scheduled Robinul every 6 hours; continue PRN dose for breakthrough secretions Haldol PRN agitation/delirium Scheduled ativan q4h; PRN doses for anxiety/seizure/sleep/distress Compazine PRN nausea/vomiting Liquifilm Tears PRN dry eye Continue keppra for seizure prophylaxis Continue methadone and diltiazem while tolerating POs   Prognosis:  < 2 weeks  Discharge Planning: Hospice facility  Care plan was discussed with  RN    Audie Leander, PA-C Palliative Medicine Team Team phone # [phone]  Thank you for allowing the Palliative Medicine Team to assist in the care of this patient. Please utilize secure chat with additional questions, if there is no response within 30 minutes please call the above phone number.  Palliative Medicine Team providers are available by phone from 7am to 7pm daily and can be reached through the team cell phone.  Should this patient require assistance outside of these hours, please call the patient's attending physician.  Portions of this note are a verbal dictation therefore any spelling and/or grammatical errors are due to the "Dragon Medical One" system interpretation.

## 2024-04-09 NOTE — Plan of Care (Signed)
  Problem: Pain Managment: Goal: General experience of comfort will improve and/or be controlled Outcome: Progressing   Problem: Safety: Goal: Ability to remain free from injury will improve Outcome: Progressing   Problem: Skin Integrity: Goal: Risk for impaired skin integrity will decrease Outcome: Progressing

## 2024-04-10 DIAGNOSIS — C61 Malignant neoplasm of prostate: Secondary | ICD-10-CM | POA: Diagnosis not present

## 2024-04-10 DIAGNOSIS — N390 Urinary tract infection, site not specified: Secondary | ICD-10-CM

## 2024-04-10 DIAGNOSIS — Z515 Encounter for palliative care: Principal | ICD-10-CM

## 2024-04-10 DIAGNOSIS — I4891 Unspecified atrial fibrillation: Secondary | ICD-10-CM | POA: Diagnosis not present

## 2024-04-10 MED ORDER — LORAZEPAM 2 MG/ML PO CONC
2.0000 mg | ORAL | Status: DC
  Administered 2024-04-10 – 2024-04-11 (×7): 2 mg via SUBLINGUAL
  Filled 2024-04-10 (×7): qty 1

## 2024-04-10 MED ORDER — MORPHINE SULFATE (CONCENTRATE) 10 MG /0.5 ML PO SOLN
5.0000 mg | ORAL | Status: DC | PRN
  Administered 2024-04-10 – 2024-04-11 (×5): 5 mg via SUBLINGUAL
  Filled 2024-04-10 (×5): qty 0.5

## 2024-04-10 MED ORDER — LORAZEPAM 2 MG/ML PO CONC
2.0000 mg | ORAL | Status: DC | PRN
  Administered 2024-04-10: 2 mg via SUBLINGUAL
  Filled 2024-04-10: qty 1

## 2024-04-10 MED ORDER — LORAZEPAM 2 MG/ML PO CONC: 2.0000 mg | ORAL | Status: DC | PRN

## 2024-04-10 MED ORDER — LORAZEPAM 2 MG/ML PO CONC
2.0000 mg | ORAL | Status: DC
Start: 2024-04-10 — End: 2024-04-10

## 2024-04-10 NOTE — Progress Notes (Signed)
 Thomas Donovan 4U98 [facility] Patient Visit   Thomas Donovan is a current hospice patient, with hospice diagnosis of malignant neoplasm of the prostate. He was admitted to [facility] with diagnosis of prostate cancer metastatic to central nervous system and atrial fibrillation. Patient experienced a fall, extreme agitation and combative behavior at his facility. EMS was called to assist. Multiple EMS workers and facility staff were required to facilitate patient transport to [facility] ED. Per Dr. Tessie Fila, hospice physician, this is a related hospital admission.   Visited with patient at bedside. Report exchanged with bedside nurse. No family present at time of my visit. Patient was resting comfortably with eyes closed. He did not have restraints on at the time of my visit but did have mitt on left hand. Spoke with sister in-law by phone to follow up on any concerns. Plan is to have patient re-evaluated by [facility] tomorrow for [facility] appropriateness.    Patient is GIP appropriate for needing IV medications and continued monitoring for effectiveness of interventions for acute metabolic encephalopathy due to tumor, end stage cancer.   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 [facility]) Active Problems:   Acute metabolic encephalopathy   Uncontrolled type 2 diabetes mellitus with hyperglycemia, without long-term current use of insulin (HCC)   Chronic kidney disease, stage 3a (HCC)   Chronic heart failure with preserved ejection fraction (HFpEF) (HCC)   Obesity (BMI 30-39.9) Class I   OSA on CPAP   Atrial fibrillation with RVR (HCC)   Cancer,  metastatic to liver [facility])   Essential (primary) hypertension   Right humerus fracture    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  Complains of mild pain in his body.   Acute metabolic encephalopathy -Due to metastatic disease, end stage cancer, failure to thrive. Currently on comfort care    Right humerus fracture Currently on comfort care   Essential (primary) hypertension Coronary artery disease Continue comfort care   Atrial fibrillation with RVR (HCC) Chronic atrial fibrilliation Consultation   OSA on CPAP - Comfort care   Obesity (BMI 30-39.9) Class I Complicates care   Chronic heart failure with preserved ejection fraction (HFpEF) (HCC) Currently on comfort care.Chronic kidney disease, stage 3a (HCC)    Uncontrolled type 2 diabetes mellitus with hyperglycemia, without long-term current use of insulin (HCC) Continue comfort care    Discharge Planning: ongoing, possibly IPU placement with [facility]   Family Contact: spoke with sister-in-law on phone    IDT: Updated   Goals of Care: DNR   Should patient need ambulance transfer at discharge- please use [facility]) as they contract this service for our active hospice patients.   Ardine Beckwith, LPN [facility] Liaison [phone]

## 2024-04-10 NOTE — Progress Notes (Signed)
 Daily Progress Note   Patient Name: Thomas Donovan       Date: 04/10/2024 DOB: September 16, 1961  Age: 63 y.o. MRN#: [id number] Attending Physician: Rosena Conradi, MD Primary Care Physician: Pcp, No Admit Date: 04/04/2024  Reason for Consultation/Follow-up: Non pain symptom management, Pain control, Psychosocial/spiritual support, and Terminal Care  Subjective: Medical records reviewed including progress notes, labs, imaging. Patient assessed at the bedside. He is resting comfortably. His sister is present visiting. She anticipates meeting with [facility] hospice liaison 10-10:30am today. Discussed management of intermittent pain with PRN IV morphine in light of patient's lethargy and inability to take methadone safely. Agitation remains well controlled. He is still not eating or drinking much at all. Unclear why he is still getting regular trays after a soft diet (grits etc.) was requested by family.  Questions and concerns addressed. PMT will continue to support holistically.   Length of Stay: 6  Current Medications:   Physical Exam Vitals and nursing note reviewed.  Constitutional:      General: He is not in acute distress.    Appearance: He is ill-appearing.  Cardiovascular:     Rate and Rhythm: Normal rate.  Pulmonary:     Effort: No respiratory distress.  Skin:    General: Skin is warm and dry.  Neurological:     Mental Status: He is lethargic.  Psychiatric:        Cognition and Memory: Cognition is impaired.            Vital Signs: BP (!) 155/74   Pulse 84   Temp 98.2 F (36.8 C) (Axillary)   Resp 16   Ht 6\' 2"  (1.88 m)   Wt 112 kg   SpO2 97%   BMI 31.70 kg/m  SpO2: SpO2: 97 % O2 Device: O2 Device: Room Air O2 Flow Rate:         Palliative Assessment/Data: PPS 10-20%   Palliative Care Assessment & Plan    Patient Profile: 63 y.o. male  with past medical history of obesity, Afib not on AC, CAD, HTN, DM, possible cirrhosis, known pelvic mass suspected prostate CA metastatic actually to liver and brain/skull/mandible, and recent right humeral fracture presented from [place] [facility] after another fall and was admitted on 04/04/2024 with acute metabolic encephalopathy and afib with RVR. After discussions with family, patient was transitioned to full comfort measures at time of admission.   Of note, he has had 5 admissions and 5 ED visits in the last 6 months. He was recently enrolled in [place] hospice services at LTC on 03/26/24.  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 [facility]) Active Problems:   Uncontrolled type 2 diabetes mellitus with hyperglycemia, without long-term current use of insulin (HCC)   Chronic kidney disease, stage 3a (HCC)   Chronic heart failure with preserved ejection fraction (HFpEF) (HCC)   Obesity (BMI 30-39.9) Class I   OSA on CPAP   Atrial fibrillation with RVR (HCC)   Acute metabolic encephalopathy   Cancer, metastatic to liver [facility])   Essential (primary) hypertension   Right humerus fracture   Terminal care  Recommendations/Plan: Continue DNR/DNI Continue comfort focused care including scheduled Ativan Q4H. Noted [facility] request for SL medications and reviewed MAR Await determination of hospice facility eligibility from [facility] today Psychosocial and emotional support provided PMT will continue to follow and support holistically  Symptom Management Morphine PRN pain/dyspnea/increased work of breathing/RR>25 Tylenol PRN pain/fever Biotin twice daily Scheduled Robinul every 6 hours; continue PRN dose for breakthrough secretions Haldol PRN agitation/delirium Scheduled ativan q4h; PRN doses for anxiety/seizure/sleep/distress Compazine PRN nausea/vomiting Liquifilm Tears PRN dry eye Continue keppra for seizure  prophylaxis Continue methadone and diltiazem while tolerating POs   Prognosis:  < 2 weeks  Discharge Planning: Hospice facility  Care plan was discussed with patient's sister    Gerrianne Aydelott, PA-C Palliative Medicine Team Team phone # [phone]  Thank you for allowing the Palliative Medicine Team to assist in the care of this patient. Please utilize secure chat with additional questions, if there is no response within 30 minutes please call the above phone number.  Palliative Medicine Team providers are available by phone from 7am to 7pm daily and can be reached through the team cell phone.  Should this patient require assistance outside of these hours, please call the patient's attending physician.  Portions of this note are a verbal dictation therefore any spelling and/or grammatical errors are due to the "Dragon Medical One" system interpretation.

## 2024-04-10 NOTE — Plan of Care (Signed)
  Problem: Health Behavior/Discharge Planning: Goal: Ability to manage health-related needs will improve Outcome: Progressing   Problem: Clinical Measurements: Goal: Ability to maintain clinical measurements within normal limits will improve Outcome: Progressing Goal: Will remain free from infection Outcome: Progressing Goal: Respiratory complications will improve Outcome: Progressing Goal: Cardiovascular complication will be avoided Outcome: Progressing   Problem: Activity: Goal: Risk for activity intolerance will decrease Outcome: Progressing   Problem: Nutrition: Goal: Adequate nutrition will be maintained Outcome: Progressing   Problem: Coping: Goal: Level of anxiety will decrease Outcome: Progressing   Problem: Elimination: Goal: Will not experience complications related to bowel motility Outcome: Progressing Goal: Will not experience complications related to urinary retention Outcome: Progressing   Problem: Pain Managment: Goal: General experience of comfort will improve and/or be controlled Outcome: Progressing   Problem: Safety: Goal: Ability to remain free from injury will improve Outcome: Progressing   Problem: Skin Integrity: Goal: Risk for impaired skin integrity will decrease Outcome: Progressing   Problem: Education: Goal: Ability to describe self-care measures that may prevent or decrease complications (Diabetes Survival Skills Education) will improve Outcome: Progressing Goal: Individualized Educational Video(s) Outcome: Progressing   Problem: Coping: Goal: Ability to adjust to condition or change in health will improve Outcome: Progressing   Problem: Fluid Volume: Goal: Ability to maintain a balanced intake and output will improve Outcome: Progressing   Problem: Health Behavior/Discharge Planning: Goal: Ability to identify and utilize available resources and services will improve Outcome: Progressing Goal: Ability to manage health-related  needs will improve Outcome: Progressing   Problem: Metabolic: Goal: Ability to maintain appropriate glucose levels will improve Outcome: Progressing   Problem: Nutritional: Goal: Maintenance of adequate nutrition will improve Outcome: Progressing Goal: Progress toward achieving an optimal weight will improve Outcome: Progressing   Problem: Skin Integrity: Goal: Risk for impaired skin integrity will decrease Outcome: Progressing   Problem: Tissue Perfusion: Goal: Adequacy of tissue perfusion will improve Outcome: Progressing   Problem: Education: Goal: Knowledge of the prescribed therapeutic regimen will improve Outcome: Progressing   Problem: Coping: Goal: Ability to identify and develop effective coping behavior will improve Outcome: Progressing   Problem: Clinical Measurements: Goal: Quality of life will improve Outcome: Progressing   Problem: Respiratory: Goal: Verbalizations of increased ease of respirations will increase Outcome: Progressing   Problem: Role Relationship: Goal: Family's ability to cope with current situation will improve Outcome: Progressing Goal: Ability to verbalize concerns, feelings, and thoughts to partner or family member will improve Outcome: Progressing   Problem: Pain Management: Goal: Satisfaction with pain management regimen will improve Outcome: Progressing

## 2024-04-10 NOTE — Progress Notes (Addendum)
 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  He was noted to have a large skull base  tumor and liver nodules, in addition to his known pelvic mass (from prostate CA).  Neurosurgery team was consulted, but the tumor was inoperable, so patient was discharged with hospice.  Patient has had multiple falls and returned on 03/29/2024 with fractured right humerus and confusion, agitation, refusing cares, arm swelling.  Palliative care team was consulted.  Patient is now full comfort. Patient is awaiting evaluation for inpatient hospice on 04/10/2024 by a hospice team that may be closer to his home.  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 [facility]) Active Problems:   Acute metabolic encephalopathy   Uncontrolled type 2 diabetes mellitus with hyperglycemia, without long-term current use of insulin (HCC)   Chronic kidney disease, stage 3a (HCC)   Chronic heart failure with preserved ejection fraction (HFpEF) (HCC)   Obesity (BMI 30-39.9) Class I   OSA on CPAP   Atrial fibrillation with RVR (HCC)   Cancer, metastatic to liver [facility])   Essential (primary) hypertension   Right humerus fracture   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  Complains of mild pain in his body.  Will change medications to sublingual morphine in abdomen.   Acute metabolic encephalopathy -Due to metastatic  disease, end stage cancer, failure to thrive. Currently on comfort care   Right humerus fracture Currently on comfort care   Essential (primary) hypertension Coronary artery disease Continue comfort care   Atrial fibrillation with RVR (HCC) Chronic atrial fibrilliation Consultation   OSA on CPAP - Comfort care   Obesity (BMI 30-39.9) Class I Complicates care   Chronic heart failure with preserved ejection fraction (HFpEF) (HCC) Currently on comfort care.Chronic kidney disease, stage 3a (HCC)    Uncontrolled type 2 diabetes mellitus with hyperglycemia, without long-term current use of insulin (HCC) Continue comfort care   DVT prophylaxis: None  Code Status: DO NOT RESUSCITATE.  Comfort measures.  Family Communication: Spoke with the patient's sister at bedside.  Disposition Plan:  Residential hospice likely for 04/11/24 if able to do sublingual Ativan and morphine.  Facility does not wish IV narcotics and benzos to be administered.  Consultants:  Palliative care team.  Procedures:  None.  Antimicrobials:  None.   Subjective: Today, patient was seen and examined at bedside.  Appears to be comfortable in bed.    Objective: Vitals:   04/06/24 0440 04/08/24 0856 04/09/24 0553 04/09/24 1042  BP: (!) 145/88 (!) 152/106 (!) 155/74 (!) 155/74  Pulse: (!) 45 82 84   Resp:  14 16   Temp: 99 F (37.2 C) 98.2 F (36.8 C)    TempSrc: Oral Axillary    SpO2: 94%  97%   Weight:      Height:       No intake or output data in the 24 hours ending 04/10/24 1346   Filed Weights   04/04/24 8119  Weight: 112 kg    Examination:  General:   not in obvious distress, obese, comfortable. Lying in bed.   Data Reviewed: I have personally reviewed following labs and imaging studies  CBC: Recent Labs  Lab 04/04/24 0905  WBC 13.3*  NEUTROABS 12.4*  HGB 9.5*  HCT 30.5*  MCV 83.6  PLT 215   Basic Metabolic Panel: Recent Labs  Lab 04/04/24 0905  NA 137  K 4.0   CL 101  CO2 25  GLUCOSE 462*  BUN 31*  CREATININE 1.28*  CALCIUM 8.3*   GFR: Estimated Creatinine Clearance: 80.7 mL/min (A) (by C-G formula based on SCr of 1.28 mg/dL (H)). Liver Function Tests: Recent Labs  Lab 04/04/24 0905  AST 26  ALT 56*  ALKPHOS 138*  BILITOT 1.7*  PROT 5.1*  ALBUMIN 2.3*   No results for input(s): "LIPASE", "AMYLASE" in the last 168 hours. No results for input(s): "AMMONIA" in the last 168 hours. Coagulation Profile: No results for input(s): "INR", "PROTIME" in the last 168 hours. Cardiac Enzymes: No results for input(s): "CKTOTAL", "CKMB", "CKMBINDEX", "TROPONINI" in the last 168 hours. BNP (last 3 results) No results for input(s): "PROBNP" in the last 8760 hours. HbA1C: No results for input(s): "HGBA1C" in the last 72 hours. CBG: Recent Labs  Lab 04/04/24 0822 04/04/24 1658 04/05/24 0635 04/06/24 2046  GLUCAP 376* 326* 209* 91   Lipid Profile: No results for input(s): "CHOL", "HDL", "LDLCALC", "TRIG", "CHOLHDL", "LDLDIRECT" in the last 72 hours. Thyroid Function Tests: No results for input(s): "TSH", "T4TOTAL", "FREET4", "T3FREE", "THYROIDAB" in the last 72 hours. Anemia Panel: No results for input(s): "VITAMINB12", "FOLATE", "FERRITIN", "TIBC", "IRON", "RETICCTPCT" in the last 72 hours. Urine analysis:    Component Value Date/Time   COLORURINE AMBER (A) 04/04/2024 0905   APPEARANCEUR HAZY (A) 04/04/2024 0905   LABSPEC 1.022 04/04/2024 0905   PHURINE 5.0 04/04/2024 0905   GLUCOSEU >=500 (A) 04/04/2024 0905   HGBUR NEGATIVE 04/04/2024 0905   BILIRUBINUR NEGATIVE 04/04/2024 0905   KETONESUR NEGATIVE 04/04/2024 0905   PROTEINUR >=300 (A) 04/04/2024 0905   UROBILINOGEN 0.2 01/01/2015 0912   NITRITE NEGATIVE 04/04/2024 0905   LEUKOCYTESUR SMALL (A) 04/04/2024 0905   Sepsis Labs: @LABRCNTIP (procalcitonin:4,lacticidven:4)  ) Recent Results (from the past 240 hours)  Urine Culture     Status: Abnormal   Collection Time: 04/04/24  11:01 AM   Specimen: Urine, Clean Catch  Result Value Ref Range Status   Specimen Description URINE, CLEAN CATCH  Final   Special Requests   Final    NONE Performed at [facility], [address] [place], [place] [address]    Culture MULTIPLE SPECIES PRESENT, SUGGEST RECOLLECTION (A)  Final   Report Status 04/05/2024 FINAL  Final       Radiology Studies: No results found.    Scheduled Meds:  antiseptic oral rinse  15 mL Topical BID   glycopyrrolate  0.2 mg Intravenous Q6H   LORazepam  2 mg Sublingual Q4H   methadone  10 mg Oral Q8H   Continuous Infusions:  levETIRAcetam 500 mg (04/10/24 0317)     LOS: 6 days   Elvin Hammer, MD [facility]  04/10/2024

## 2024-04-10 NOTE — TOC CM/SW Note (Signed)
 Thomas Donovan from [place] called NCM and requested NCM to ask MD to change all medications from IV to SL. They will reassess to make sure symptoms are controlled with SL medications. Secure chatted MD and nurse

## 2024-04-11 DIAGNOSIS — Z515 Encounter for palliative care: Secondary | ICD-10-CM | POA: Diagnosis not present

## 2024-04-11 DIAGNOSIS — C61 Malignant neoplasm of prostate: Secondary | ICD-10-CM | POA: Diagnosis not present

## 2024-04-11 DIAGNOSIS — I4891 Unspecified atrial fibrillation: Secondary | ICD-10-CM | POA: Diagnosis not present

## 2024-04-11 DIAGNOSIS — C7940 Secondary malignant neoplasm of unspecified part of nervous system: Secondary | ICD-10-CM | POA: Diagnosis not present

## 2024-04-11 DIAGNOSIS — N390 Urinary tract infection, site not specified: Secondary | ICD-10-CM | POA: Diagnosis not present

## 2024-04-11 MED ORDER — ACETAMINOPHEN 650 MG RE SUPP: 650.0000 mg | Freq: Four times a day (QID) | RECTAL | Status: AC | PRN

## 2024-04-11 MED ORDER — LORAZEPAM 2 MG/ML PO CONC: 2.0000 mg | ORAL | Status: AC | PRN

## 2024-04-11 MED ORDER — POLYVINYL ALCOHOL 1.4 % OP SOLN: 1.0000 [drp] | OPHTHALMIC | Status: AC | PRN

## 2024-04-11 MED ORDER — PROCHLORPERAZINE 25 MG RE SUPP: 25.0000 mg | Freq: Two times a day (BID) | RECTAL | Status: AC | PRN

## 2024-04-11 MED ORDER — LEVETIRACETAM 100 MG/ML PO SOLN: 500.0000 mg | Freq: Two times a day (BID) | ORAL | Status: AC

## 2024-04-11 MED ORDER — MORPHINE SULFATE (CONCENTRATE) 10 MG /0.5 ML PO SOLN: 5.0000 mg | ORAL | Status: AC | PRN

## 2024-04-11 NOTE — Progress Notes (Addendum)
 Report called to [facility] and given to [place].  Per Shelvy Dickens, no need to keep PIV in.  PIV removed prior to discharge. Chain and bear were sent with him with [facility]

## 2024-04-11 NOTE — Progress Notes (Signed)
 Daily Progress Note   Patient Name: Thomas Donovan       Date: 04/11/2024 DOB: 08/28/1961  Age: 63 y.o. MRN#: [id number] Attending Physician: Rosena Conradi, MD Primary Care Physician: Pcp, No Admit Date: 04/04/2024  Reason for Consultation/Follow-up: Non pain symptom management, Pain control, Psychosocial/spiritual support, and Terminal Care  Subjective: Medical records reviewed including progress notes, MAR. He has required 4 doses of PRN morphine in the past 24 hours. Patient assessed at the bedside. He is resting comfortably. Did not attempt to arouse in order to preserve comfort. No visitors present during my visit.  Questions and concerns addressed. PMT will continue to support holistically.   Length of Stay: 7  Current Medications:   Physical Exam Vitals and nursing note reviewed.  Constitutional:      General: He is not in acute distress.    Appearance: He is ill-appearing.  Cardiovascular:     Rate and Rhythm: Normal rate.  Pulmonary:     Effort: No respiratory distress.  Skin:    General: Skin is warm and dry.  Neurological:     Mental Status: He is lethargic.  Psychiatric:        Cognition and Memory: Cognition is impaired.            Vital Signs: BP 122/77 (BP Location: Left Wrist)   Pulse 69   Temp 99.9 F (37.7 C) (Oral)   Resp (!) 23   Ht 6\' 2"  (1.88 m)   Wt 112 kg   SpO2 95%   BMI 31.70 kg/m  SpO2: SpO2: 95 % O2 Device: O2 Device: Room Air O2 Flow Rate:         Palliative Assessment/Data: PPS 10-20%   Palliative Care Assessment & Plan   Patient Profile: 63 y.o. male  with past medical history of obesity, Afib not on AC, CAD, HTN, DM, possible cirrhosis, known pelvic mass suspected prostate CA metastatic actually to liver and brain/skull/mandible, and recent right humeral fracture presented  from [facility] after another fall and was admitted on 04/04/2024 with acute metabolic encephalopathy and afib with RVR. After discussions with family, patient was transitioned to full comfort measures at time of admission.   Of note, he has had 5 admissions and 5 ED visits in the last 6 months. He was recently enrolled in [place] hospice services at [facility] on 03/26/24.  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 [facility]) Active Problems:   Uncontrolled type 2 diabetes mellitus with hyperglycemia, without long-term current use of insulin (HCC)   Chronic kidney disease, stage 3a (HCC)  Chronic heart failure with preserved ejection fraction (HFpEF) (HCC)   Obesity (BMI 30-39.9) Class I   OSA on CPAP   Atrial fibrillation with RVR (HCC)   Acute metabolic encephalopathy   Cancer, metastatic to liver (HCC)   Essential (primary) hypertension   Right humerus fracture   Terminal care  Recommendations/Plan: Continue DNR/DNI Continue comfort focused care Await determination of hospice facility eligibility from [facility] PMT will continue to follow and support holistically  Symptom Management Morphine PRN pain/dyspnea/increased work of breathing/RR>25 - consider scheduling Q6H if PRN use remains consistent  Tylenol PRN pain/fever Biotin twice daily Scheduled Robinul every 6 hours; continue PRN dose for breakthrough secretions Haldol PRN agitation/delirium Scheduled ativan q4h; PRN doses for anxiety/seizure/sleep/distress Compazine PRN nausea/vomiting Liquifilm Tears PRN dry eye Continue keppra for seizure prophylaxis Continue methadone and diltiazem while tolerating POs   Prognosis:  < 2 weeks  Discharge Planning: Hospice facility    Audie Leander, [place] Palliative Medicine Team Team phone # [phone]  Thank you for allowing the Palliative Medicine Team to assist in the care of this patient. Please utilize secure chat with  additional questions, if there is no response within 30 minutes please call the above phone number.  Palliative Medicine Team providers are available by phone from 7am to 7pm daily and can be reached through the team cell phone.  Should this patient require assistance outside of these hours, please call the patient's attending physician.  Portions of this note are a verbal dictation therefore any spelling and/or grammatical errors are due to the "[employer] One" system interpretation.

## 2024-04-11 NOTE — Discharge Summary (Signed)
 Physician Discharge Summary  ABDALRAHMAN CLEMENTSON ZOX:[id number] DOB: [birth year] DOA: 04/04/2024  PCP: Pcp, No  Admit date: 04/04/2024 Discharge date: 04/11/2024  Admitted From: Skilled nursing facility  Discharge disposition: Residential hospice   Recommendations for Outpatient Follow-Up:   Follow-up with hospice care provider at the hospice facility.  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 He was noted to have a large skull base tumor and liver nodules, in addition to his known pelvic mass (from prostate CA). Neurosurgery team was consulted, but the tumor was inoperable, so patient was discharged with hospice. Patient has had multiple falls and returned on 03/29/2024 with fractured right humerus and confusion, agitation, refusing cares, arm swelling. Palliative care team was consulted. Patient is now full comfort.  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  Complains of mild pain in his body.  Will change medications to sublingual morphine and Ativan.  Continue methadone from home.   Acute metabolic encephalopathy -Due to metastatic disease, end stage cancer, failure to thrive. Currently on comfort care    Right humerus fracture Currently on comfort care   Essential (primary) hypertension Coronary artery disease Continue comfort care   Atrial fibrillation with RVR (HCC) Chronic atrial fibrilliation On comfort care.   OSA on CPAP - Comfort care   Obesity (BMI 30-39.9) Class I Complicates care.  Comfort care.   Chronic heart failure with preserved ejection fraction (HFpEF) (HCC) Currently on comfort care.  Chronic kidney disease, stage 3a (HCC)    Uncontrolled type 2 diabetes mellitus with hyperglycemia, without long-term current use of insulin (HCC) Continue comfort care  isposition.  At this time, patient is stable for disposition to residential hospice.  Medical Consultants:   Palliative care  Procedures:    None Subjective:   Today, patient was seen and examined at bedside.  Appears to be calm and comfortable.  Discharge Exam:   Vitals:   04/10/24 2213 04/11/24 0634  BP:  122/77  Pulse:  69  Resp:  (!) 23  Temp: 98.1 F (36.7 C) 99.9 F (37.7 C)  SpO2:  95%   Vitals:   04/09/24 1042 04/10/24 1804 04/10/24 2213 04/11/24 0634  BP: (!) 155/74 (!) 158/83  122/77  Pulse:  (!) 146  69  Resp:  16  (!) 23  Temp:  (!) 101.2 F (38.4 C)  98.1 F (36.7 C) 99.9 F (37.7 C)  TempSrc:  Oral Axillary Oral  SpO2:  100%  95%  Weight:      Height:        General: Somnolent, awakes to verbal command, obese built HENT: Pupils reactive.  Oral mucosa moist Chest: Coarse breath sounds noted bilaterally. CVS:  S1 &S2 heard.  Abdomen soft. Extremities: No cyanosis, clubbing or edema.  Psych: Somnolent, awakes to verbal command. CNS: Somnolent, difficult understand speech, will wake up with verbal, Skin: Warm and dry.  The results of significant diagnostics from this hospitalization (including imaging, microbiology, ancillary and laboratory) are listed below for reference.     Diagnostic Studies:   CT Head Wo Contrast Result Date: 04/04/2024 CLINICAL DATA:  Head trauma.  Left middle cranial fossa tumor. EXAM: CT HEAD WITHOUT CONTRAST TECHNIQUE: Contiguous axial images were obtained from the base of the skull through the vertex without intravenous contrast. RADIATION DOSE REDUCTION: This exam was performed according to the departmental dose-optimization program which includes automated exposure control, adjustment of the mA and/or kV according to patient size and/or use of iterative reconstruction technique. COMPARISON:  Head CT dated 03/27/2024. FINDINGS: Brain: Decrease in the left temporoparietal edema compared to prior CT. Decreased mass effect and compression of the left lateral ventricle. Approximally 4 mm left-to-right midline shift measured at the level of foramen Monro. 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 There is no acute intracranial hemorrhage. Vascular: No hyperdense vessel or unexpected calcification. Skull: Normal. Negative for fracture or focal lesion. Sinuses/Orbits: Diffuse mucoperiosteal thickening of paranasal sinuses. The mastoid air cells are clear. Other: None IMPRESSION: 1. No acute intracranial hemorrhage. 2. Decrease in the left temporoparietal edema and decreased mass effect and midline shift since the prior CT. 3. 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 Electronically Signed   By: Elgie Collard M.D.   On: 04/04/2024 11:44   DG Chest Portable 1 View Result Date: 04/04/2024 CLINICAL DATA:  AFib with RVR EXAM: PORTABLE CHEST 1 VIEW  COMPARISON:  Chest x-ray performed March 27, 2024 FINDINGS: Heart mediastinum are not significantly changed. No focal infiltrate. No overt edema or pleural effusion. IMPRESSION: No active disease.  No significant interval change. Electronically Signed   By: Lowell Guitar M.D.   On: 04/04/2024 11:36     Labs:   Basic Metabolic Panel: No results for input(s): "NA", "K", "CL", "CO2", "GLUCOSE", "BUN", "CREATININE", "CALCIUM", "MG", "PHOS" in the last 168 hours. GFR Estimated Creatinine Clearance: 80.7 mL/min (A) (by C-G formula based on SCr of 1.28 mg/dL (H)). Liver Function Tests: No results for input(s): "AST", "ALT", "ALKPHOS", "BILITOT", "PROT", "ALBUMIN" in the last 168 hours. No results for input(s): "LIPASE", "AMYLASE" in the last 168 hours. No results for input(s): "AMMONIA" in the last 168 hours. Coagulation profile No results for input(s): "INR", "PROTIME" in the last 168 hours.  CBC: No results for input(s): "WBC", "NEUTROABS", "HGB", "HCT", "MCV", "PLT" in the last 168 hours. Cardiac Enzymes: No results for input(s): "CKTOTAL", "CKMB", "CKMBINDEX", "TROPONINI" in the last 168 hours. BNP: Invalid input(s): "POCBNP" CBG: Recent Labs  Lab 04/04/24 1658 04/05/24 0635 04/06/24 2046  GLUCAP 326* 209* 91   D-Dimer No results for input(s): "DDIMER" in the last 72 hours. Hgb A1c No results for input(s): "HGBA1C" in the last 72 hours. Lipid Profile No results for input(s): "CHOL", "HDL", "LDLCALC", "TRIG", "CHOLHDL", "LDLDIRECT" in the last 72 hours. Thyroid function studies No results for input(s): "TSH", "T4TOTAL", "  T3FREE", "THYROIDAB" in the last 72 hours.  Invalid input(s): "FREET3" Anemia work up No results for input(s): "VITAMINB12", "FOLATE", "FERRITIN", "TIBC", "IRON", "RETICCTPCT" in the last 72 hours. Microbiology Recent Results (from the past 240 hours)  Urine Culture     Status: Abnormal   Collection Time: 04/04/24 11:01 AM   Specimen: Urine, Clean Catch   Result Value Ref Range Status   Specimen Description URINE, CLEAN CATCH  Final   Special Requests   Final    NONE Performed at [facility], [address] [place], [place] [address]    Culture MULTIPLE SPECIES PRESENT, SUGGEST RECOLLECTION (A)  Final   Report Status 04/05/2024 FINAL  Final     Discharge Instructions:   Discharge Instructions     Discharge instructions   Complete by: As directed    Further care as per hospice facility.      Allergies as of 04/11/2024       Reactions   Celebrex [celecoxib] Anaphylaxis   Not listed on MAR   Guaifenesin Anaphylaxis   Not listed on MAR   Ibuprofen Hives, Other (See Comments)   GI bleeding OTC NSAIDS cause blood in stools Not listed on MAR   Tramadol Hives, Rash   Acetaminophen Other (See Comments)   Liver issues, not listed on mar   Aspirin Other (See Comments)   Pt was told not to take by MD due to being on Colchicine. - Patient is currently not on colchicine. not listed on mar   Ketorolac Hives   Blisters between fingers Not listed on MAR   Nsaids Other (See Comments)   Rectal bleeding, Not listed on MAR   Toradol [ketorolac Tromethamine] Other (See Comments)   Blisters between fingers, not listed on MAR        Medication List     STOP taking these medications    diltiazem 180 MG 24 hr capsule Commonly known as: CARDIZEM CD   diltiazem 60 MG tablet Commonly known as: CARDIZEM   finasteride 5 MG tablet Commonly known as: PROSCAR   insulin aspart 100 UNIT/ML injection Commonly known as: novoLOG   insulin glargine 100 UNIT/ML Solostar Pen Commonly known as: LANTUS   levETIRAcetam 500 MG tablet Commonly known as: KEPPRA Replaced by: levETIRAcetam 100 MG/ML solution   LORazepam 1 MG tablet Commonly known as: Ativan Replaced by: LORazepam 2 MG/ML concentrated solution   ondansetron 4 MG tablet Commonly known as: ZOFRAN   Oxycodone HCl 10 MG Tabs   pantoprazole 40 MG tablet Commonly  known as: PROTONIX   QUEtiapine 50 MG tablet Commonly known as: SEROQUEL   senna 8.6 MG Tabs tablet Commonly known as: SENOKOT   tamsulosin 0.4 MG Caps capsule Commonly known as: FLOMAX       TAKE these medications    acetaminophen 650 MG suppository Commonly known as: TYLENOL Place 1 suppository (650 mg total) rectally every 6 (six) hours as needed for fever or mild pain (pain score 1-3).   barrier cream Crea Commonly known as: non-specified Apply 1 Application topically in the morning and at bedtime.   levETIRAcetam 100 MG/ML solution Commonly known as: Keppra Take 5 mLs (500 mg total) by mouth 2 (two) times daily. Replaces: levETIRAcetam 500 MG tablet   LORazepam 2 MG/ML concentrated solution Commonly known as: ATIVAN Place 1 mL (2 mg total) under the tongue every 2 (two) hours as needed for anxiety. Replaces: LORazepam 1 MG tablet   methadone 5 MG tablet Commonly known as: DOLOPHINE  Take 1 tablet (5 mg total) by mouth 3 (three) times daily. What changed: how much to take   morphine CONCENTRATE 10 mg / 0.5 ml concentrated solution Place 0.25 mLs (5 mg total) under the tongue every 2 (two) hours as needed for severe pain (pain score 7-10) or moderate pain (pain score 4-6).   polyvinyl alcohol 1.4 % ophthalmic solution Commonly known as: LIQUIFILM TEARS Place 1 drop into both eyes as needed for dry eyes.   prochlorperazine 25 MG suppository Commonly known as: COMPAZINE Place 1 suppository (25 mg total) rectally every 12 (twelve) hours as needed for nausea or vomiting.          Time coordinating discharge: 39 minutes  Signed:  Tamicka Shimon  [facility] 04/11/2024, 11:29 AM

## 2024-04-11 NOTE — TOC Progression Note (Addendum)
 Transition of Care (TOC) - Progression Note   Beth with [facility], saw patient today. She will review with her Wellsite geologist and speak with family . Beth will notify NCM of determination. If approved they do have a bed today  Notified team   1115 Received a call from [place] with [facility] . They can accept patient today at [facility]. [facility] nurse has spoken to patient's SIL and has consents.   [facility] faxing same day paperwork to [facility], once complete [facility] will let NCM know to call [facility]. [facility] paperwork on chart   Samantha with [facility] ready for patient to be transported . Nurse to call report to [phone] . Secure chatted team    Nurse ready. NCM called [facility], they will be here in a hour. Team and patient's sister aware   [facility] updated  Patient Details  Name: Thomas Donovan MRN: [id number] Date of Birth: 07/15/1961  Transition of Care [facility]) CM/SW Contact  Mackinze Criado, Arturo Late, RN Phone Number: 04/11/2024, 10:56 AM  Clinical Narrative:            Expected Discharge Plan and Services                                               Social Determinants of Health (SDOH) Interventions SDOH Screenings   Food Insecurity: No Food Insecurity (04/04/2024)  Housing: Low Risk  (04/04/2024)  Transportation Needs: No Transportation Needs (04/04/2024)  Utilities: Not At Risk (04/04/2024)  Financial Resource Strain: Medium Risk (01/05/2023)   [facility] from [facility]  Physical Activity: Inactive (10/04/2023)   Received from [facility]  Social Connections: Moderately Integrated (10/04/2023)   Received from [facility]  Stress: Stress Concern Present (10/04/2023)   Received from [facility]  Tobacco Use: Medium Risk (04/04/2024)  Health Literacy: Medium Risk (10/04/2023)   Received from [facility]    Readmission Risk Interventions     No data to display

## 2024-04-11 NOTE — Plan of Care (Signed)
  Problem: Health Behavior/Discharge Planning: Goal: Ability to manage health-related needs will improve Outcome: Progressing   Problem: Clinical Measurements: Goal: Ability to maintain clinical measurements within normal limits will improve Outcome: Progressing Goal: Will remain free from infection Outcome: Progressing Goal: Respiratory complications will improve Outcome: Progressing Goal: Cardiovascular complication will be avoided Outcome: Progressing   Problem: Activity: Goal: Risk for activity intolerance will decrease Outcome: Progressing   Problem: Nutrition: Goal: Adequate nutrition will be maintained Outcome: Progressing   Problem: Coping: Goal: Level of anxiety will decrease Outcome: Progressing   Problem: Elimination: Goal: Will not experience complications related to bowel motility Outcome: Progressing Goal: Will not experience complications related to urinary retention Outcome: Progressing   Problem: Pain Managment: Goal: General experience of comfort will improve and/or be controlled Outcome: Progressing   Problem: Safety: Goal: Ability to remain free from injury will improve Outcome: Progressing   Problem: Skin Integrity: Goal: Risk for impaired skin integrity will decrease Outcome: Progressing   Problem: Education: Goal: Ability to describe self-care measures that may prevent or decrease complications (Diabetes Survival Skills Education) will improve Outcome: Progressing Goal: Individualized Educational Video(s) Outcome: Progressing   Problem: Coping: Goal: Ability to adjust to condition or change in health will improve Outcome: Progressing   Problem: Fluid Volume: Goal: Ability to maintain a balanced intake and output will improve Outcome: Progressing   Problem: Health Behavior/Discharge Planning: Goal: Ability to identify and utilize available resources and services will improve Outcome: Progressing Goal: Ability to manage health-related  needs will improve Outcome: Progressing   Problem: Metabolic: Goal: Ability to maintain appropriate glucose levels will improve Outcome: Progressing   Problem: Nutritional: Goal: Maintenance of adequate nutrition will improve Outcome: Progressing Goal: Progress toward achieving an optimal weight will improve Outcome: Progressing   Problem: Skin Integrity: Goal: Risk for impaired skin integrity will decrease Outcome: Progressing   Problem: Tissue Perfusion: Goal: Adequacy of tissue perfusion will improve Outcome: Progressing   Problem: Education: Goal: Knowledge of the prescribed therapeutic regimen will improve Outcome: Progressing   Problem: Coping: Goal: Ability to identify and develop effective coping behavior will improve Outcome: Progressing   Problem: Clinical Measurements: Goal: Quality of life will improve Outcome: Progressing   Problem: Respiratory: Goal: Verbalizations of increased ease of respirations will increase Outcome: Progressing   Problem: Role Relationship: Goal: Family's ability to cope with current situation will improve Outcome: Progressing Goal: Ability to verbalize concerns, feelings, and thoughts to partner or family member will improve Outcome: Progressing   Problem: Pain Management: Goal: Satisfaction with pain management regimen will improve Outcome: Progressing

## 2024-04-25 DEATH — deceased
